# Patient Record
Sex: Male | Born: 1935 | Race: White | Hispanic: No | Marital: Married | Smoking: Former smoker
Health system: Southern US, Community
[De-identification: ages and names within clinical notes are randomized; demographics above are authoritative.]

## PROBLEM LIST (undated history)

## (undated) DIAGNOSIS — I447 Left bundle-branch block, unspecified: Secondary | ICD-10-CM

## (undated) DIAGNOSIS — I83893 Varicose veins of bilateral lower extremities with other complications: Secondary | ICD-10-CM

## (undated) DIAGNOSIS — I10 Essential (primary) hypertension: Secondary | ICD-10-CM

## (undated) DIAGNOSIS — N200 Calculus of kidney: Secondary | ICD-10-CM

## (undated) DIAGNOSIS — I119 Hypertensive heart disease without heart failure: Secondary | ICD-10-CM

## (undated) DIAGNOSIS — M7989 Other specified soft tissue disorders: Secondary | ICD-10-CM

## (undated) DIAGNOSIS — I429 Cardiomyopathy, unspecified: Secondary | ICD-10-CM

## (undated) DIAGNOSIS — C801 Malignant (primary) neoplasm, unspecified: Secondary | ICD-10-CM

## (undated) DIAGNOSIS — E785 Hyperlipidemia, unspecified: Secondary | ICD-10-CM

## (undated) DIAGNOSIS — I714 Abdominal aortic aneurysm, without rupture, unspecified: Secondary | ICD-10-CM

## (undated) DIAGNOSIS — K5730 Diverticulosis of large intestine without perforation or abscess without bleeding: Secondary | ICD-10-CM

## (undated) DIAGNOSIS — J45909 Unspecified asthma, uncomplicated: Secondary | ICD-10-CM

## (undated) DIAGNOSIS — R0902 Hypoxemia: Secondary | ICD-10-CM

## (undated) DIAGNOSIS — R197 Diarrhea, unspecified: Secondary | ICD-10-CM

## (undated) DIAGNOSIS — I8390 Asymptomatic varicose veins of unspecified lower extremity: Secondary | ICD-10-CM

## (undated) DIAGNOSIS — J439 Emphysema, unspecified: Secondary | ICD-10-CM

## (undated) DIAGNOSIS — J310 Chronic rhinitis: Secondary | ICD-10-CM

## (undated) DIAGNOSIS — K921 Melena: Secondary | ICD-10-CM

## (undated) DIAGNOSIS — J40 Bronchitis, not specified as acute or chronic: Secondary | ICD-10-CM

## (undated) DIAGNOSIS — K5792 Diverticulitis of intestine, part unspecified, without perforation or abscess without bleeding: Secondary | ICD-10-CM

## (undated) DIAGNOSIS — D126 Benign neoplasm of colon, unspecified: Secondary | ICD-10-CM

## (undated) DIAGNOSIS — R0989 Other specified symptoms and signs involving the circulatory and respiratory systems: Secondary | ICD-10-CM

## (undated) DIAGNOSIS — I872 Venous insufficiency (chronic) (peripheral): Secondary | ICD-10-CM

## (undated) DIAGNOSIS — C61 Malignant neoplasm of prostate: Secondary | ICD-10-CM

## (undated) DIAGNOSIS — R55 Syncope and collapse: Secondary | ICD-10-CM

## (undated) DIAGNOSIS — D649 Anemia, unspecified: Secondary | ICD-10-CM

## (undated) DIAGNOSIS — I6529 Occlusion and stenosis of unspecified carotid artery: Secondary | ICD-10-CM

## (undated) DIAGNOSIS — I428 Other cardiomyopathies: Secondary | ICD-10-CM

## (undated) DIAGNOSIS — E119 Type 2 diabetes mellitus without complications: Secondary | ICD-10-CM

## (undated) HISTORY — PX: HERNIA REPAIR: SHX51

## (undated) HISTORY — DX: Type 2 diabetes mellitus without complications: E11.9

## (undated) HISTORY — DX: Essential (primary) hypertension: I10

## (undated) HISTORY — DX: Bronchitis, not specified as acute or chronic: J40

## (undated) HISTORY — PX: CATARACT EXTRACTION: SUR2

## (undated) HISTORY — DX: Varicose veins of bilateral lower extremities with other complications: I83.893

## (undated) HISTORY — DX: Hyperlipidemia, unspecified: E78.5

## (undated) HISTORY — DX: Other specified soft tissue disorders: M79.89

## (undated) HISTORY — PX: PROSTATE SURGERY: SHX751

## (undated) HISTORY — DX: Asymptomatic varicose veins of unspecified lower extremity: I83.90

## (undated) HISTORY — DX: Venous insufficiency (chronic) (peripheral): I87.2

## (undated) HISTORY — DX: Syncope and collapse: R55

## (undated) HISTORY — DX: Benign neoplasm of colon, unspecified: D12.6

## (undated) HISTORY — DX: Anemia, unspecified: D64.9

## (undated) HISTORY — DX: Abdominal aortic aneurysm, without rupture, unspecified: I71.40

## (undated) HISTORY — DX: Other specified symptoms and signs involving the circulatory and respiratory systems: R09.89

## (undated) HISTORY — DX: Emphysema, unspecified: J43.9

## (undated) HISTORY — DX: Occlusion and stenosis of unspecified carotid artery: I65.29

## (undated) HISTORY — DX: Calculus of kidney: N20.0

## (undated) HISTORY — DX: Diarrhea, unspecified: R19.7

## (undated) HISTORY — DX: Malignant (primary) neoplasm, unspecified: C80.1

## (undated) HISTORY — DX: Malignant neoplasm of prostate: C61

## (undated) HISTORY — PX: OTHER SURGICAL HISTORY: SHX169

## (undated) HISTORY — DX: Left bundle-branch block, unspecified: I44.7

## (undated) HISTORY — DX: Unspecified asthma, uncomplicated: J45.909

## (undated) HISTORY — DX: Hypoxemia: R09.02

## (undated) HISTORY — DX: Abdominal aortic aneurysm, without rupture: I71.4

## (undated) HISTORY — DX: Other cardiomyopathies: I42.8

## (undated) HISTORY — DX: Diverticulosis of large intestine without perforation or abscess without bleeding: K57.30

## (undated) HISTORY — DX: Hypertensive heart disease without heart failure: I11.9

## (undated) HISTORY — DX: Melena: K92.1

## (undated) HISTORY — DX: Diverticulitis of intestine, part unspecified, without perforation or abscess without bleeding: K57.92

## (undated) HISTORY — DX: Chronic rhinitis: J31.0

## (undated) HISTORY — DX: Cardiomyopathy, unspecified: I42.9

---

## 2008-08-31 HISTORY — PX: LITHOTRIPSY: SUR834

## 2009-08-31 HISTORY — PX: OTHER SURGICAL HISTORY: SHX169

## 2009-09-04 ENCOUNTER — Ambulatory Visit (HOSPITAL_BASED_OUTPATIENT_CLINIC_OR_DEPARTMENT_OTHER): Admission: RE | Admit: 2009-09-04 | Discharge: 2009-09-04 | Payer: Self-pay | Admitting: Radiation Oncology

## 2009-09-26 ENCOUNTER — Ambulatory Visit: Admission: RE | Admit: 2009-09-26 | Discharge: 2009-10-18 | Payer: Self-pay | Admitting: Radiation Oncology

## 2009-12-29 HISTORY — PX: OTHER SURGICAL HISTORY: SHX169

## 2013-10-20 ENCOUNTER — Encounter: Payer: Self-pay | Admitting: Vascular Surgery

## 2013-10-20 ENCOUNTER — Other Ambulatory Visit: Payer: Self-pay | Admitting: *Deleted

## 2013-10-20 DIAGNOSIS — R0989 Other specified symptoms and signs involving the circulatory and respiratory systems: Secondary | ICD-10-CM

## 2013-11-21 ENCOUNTER — Encounter: Payer: Self-pay | Admitting: Vascular Surgery

## 2013-11-21 ENCOUNTER — Encounter (HOSPITAL_COMMUNITY): Payer: Self-pay

## 2013-11-27 ENCOUNTER — Encounter: Payer: Self-pay | Admitting: Vascular Surgery

## 2013-11-28 ENCOUNTER — Encounter: Payer: Self-pay | Admitting: Vascular Surgery

## 2013-11-28 ENCOUNTER — Encounter (INDEPENDENT_AMBULATORY_CARE_PROVIDER_SITE_OTHER): Payer: Self-pay

## 2013-11-28 ENCOUNTER — Ambulatory Visit (HOSPITAL_COMMUNITY)
Admission: RE | Admit: 2013-11-28 | Discharge: 2013-11-28 | Disposition: A | Discharge status: Home / Self Care | Payer: Medicare Other | Source: Ambulatory Visit | Attending: Vascular Surgery | Admitting: Vascular Surgery

## 2013-11-28 ENCOUNTER — Ambulatory Visit (INDEPENDENT_AMBULATORY_CARE_PROVIDER_SITE_OTHER): Payer: Medicare Other | Admitting: Vascular Surgery

## 2013-11-28 VITALS — BP 148/75 | HR 69 | Resp 18 | Ht 69.5 in | Wt 193.9 lb

## 2013-11-28 DIAGNOSIS — R0989 Other specified symptoms and signs involving the circulatory and respiratory systems: Secondary | ICD-10-CM

## 2013-11-28 DIAGNOSIS — I83893 Varicose veins of bilateral lower extremities with other complications: Secondary | ICD-10-CM

## 2013-11-28 DIAGNOSIS — M7989 Other specified soft tissue disorders: Secondary | ICD-10-CM | POA: Insufficient documentation

## 2013-11-28 HISTORY — DX: Other specified soft tissue disorders: M79.89

## 2013-11-28 HISTORY — DX: Other specified symptoms and signs involving the circulatory and respiratory systems: R09.89

## 2013-11-28 HISTORY — DX: Varicose veins of bilateral lower extremities with other complications: I83.893

## 2013-11-28 NOTE — Progress Notes (Signed)
Patient name: Harry Norris MRN: [id number] DOB: 05/31/1936 Sex: male   Referred by: Delena Bali  Reason for referral:  Chief Complaint  Patient presents with  . New Evaluation      referred by Dr. Delena Bali, leg swelling R>L  has been wearing compression hose for > 1 year    . Varicose Veins  . Venous Insufficiency    HISTORY OF PRESENT ILLNESS: The patient presents today for evaluation of a right greater than left leg swelling. He reports this is been present for many years. He has worn graduated compression garments for over a year and initially had some mild improvement but now is having worsening swelling again despite use of compression garment and elevation. He reports an achy sensation with prolonged standing. He is quite active at his age of 89 and enjoys golf. This is limiting his ability. He does not have any history of DVT. I do have the results of his noninvasive studies from [facility].  Past Medical History  Diagnosis Date  . Other primary cardiomyopathies   . Hypertension   . Cancer     prostate  . Unspecified venous (peripheral) insufficiency   . Anemia   . Diabetes mellitus without complication   . Diverticulosis of colon (without mention of hemorrhage)   . AAA (abdominal aortic aneurysm)   . Carotid artery occlusion   . Hyperlipidemia   . Benign neoplasm of colon   . Kidney stone     Past Surgical History  Procedure Laterality Date  . Hernia repair Bilateral   . Cataract extraction Bilateral   . Gold seed implants  2011    treatment of prostate cancer  . Lithotripsy  2010  .  cardiac catheterization  may 2011    History   Social History  . Marital Status: Married    Spouse Name: N/A    Number of Children: N/A  . Years of Education: N/A   Occupational History  . Not on file.   Social History Main Topics  . Smoking status: Former Smoker    Quit date: 09/01/1999  . Smokeless tobacco: Not on file  . Alcohol Use: No  . Drug Use: No  .  Sexual Activity: Not on file   Other Topics Concern  . Not on file   Social History Narrative  . No narrative on file    History reviewed. No pertinent family history.  Allergies as of 11/28/2013 - Review Complete 11/28/2013  Allergen Reaction Noted  . Ace inhibitors Cough 10/20/2013    Current Outpatient Prescriptions on File Prior to Visit  Medication Sig Dispense Refill  . albuterol (PROVENTIL) (2.5 MG/3ML) 0.083% nebulizer solution Take 2.5 mg by nebulization every 6 (six) hours as needed for wheezing or shortness of breath.      Marland Kitchen aspirin 325 MG EC tablet Take 325 mg by mouth daily.      Marland Kitchen atorvastatin (LIPITOR) 40 MG tablet Take 40 mg by mouth daily.      . carvedilol (COREG) 25 MG tablet Take 25 mg by mouth 2 (two) times daily with a meal. Take 1/2 tablet two times daily      . cyclobenzaprine (FLEXERIL) 10 MG tablet Take 10 mg by mouth 3 (three) times daily as needed for muscle spasms.      . fenofibrate 160 MG tablet Take 160 mg by mouth daily.      . furosemide (LASIX) 40 MG tablet Take 40 mg by mouth 2 (two) times daily.      Marland Kitchen  ipratropium-albuterol (DUONEB) 0.5-2.5 (3) MG/3ML SOLN Take 3 mLs by nebulization every 6 (six) hours as needed.      . meclizine (ANTIVERT) 25 MG tablet Take 25 mg by mouth as needed.       . Methylcellulose, Laxative, (CITRUCEL) 500 MG TABS Take 500 mg by mouth 2 (two) times daily.      . mometasone-formoterol (DULERA) 200-5 MCG/ACT AERO Inhale 2 puffs into the lungs 2 (two) times daily.      . Multiple Vitamin (MULTIVITAMIN) capsule Take 1 capsule by mouth daily.      . Omega-3 Fatty Acids (FISH OIL) 1000 MG CAPS Take 1,000 mg by mouth 2 (two) times daily.      . promethazine (PHENERGAN) 25 MG tablet Take 25 mg by mouth every 6 (six) hours as needed for nausea or vomiting.      . vitamin C (ASCORBIC ACID) 500 MG tablet Take 500 mg by mouth daily.      . vitamin E 400 UNIT capsule Take 400 Units by mouth daily.      . valsartan (DIOVAN) 40 MG  tablet Take 40 mg by mouth 2 (two) times daily.       No current facility-administered medications on file prior to visit.     REVIEW OF SYSTEMS:  Positives indicated with an "X"  CARDIOVASCULAR:  [ ]  chest pain   [ ]  chest pressure   [ ]  palpitations   [x ] orthopnea   [x ] dyspnea on exertion   [ ]  claudication   [ ]  rest pain   [ ]  DVT   [ ]  phlebitis PULMONARY:   [x ] productive cough   [x ] asthma   [x ] wheezing NEUROLOGIC:   [ ]  weakness  [ ]  paresthesias  [ ]  aphasia  [ ]  amaurosis  [ ]  dizziness HEMATOLOGIC:   [ ]  bleeding problems   [ ]  clotting disorders MUSCULOSKELETAL:  [ ]  joint pain   [ ]  joint swelling GASTROINTESTINAL: [ ]   blood in stool  [ ]   hematemesis GENITOURINARY:  [ ]   dysuria  [ ]   hematuria PSYCHIATRIC:  [ ]  history of major depression INTEGUMENTARY:  [ ]  rashes  [ ]  ulcers CONSTITUTIONAL:  [ ]  fever   [ ]  chills  PHYSICAL EXAMINATION:  General: The patient is a well-nourished male, in no acute distress. Vital signs are BP 148/75  Pulse 69  Resp 18  Ht 5' 9.5" (1.765 m)  Wt 193 lb 14.4 oz (87.952 kg)  BMI 28.23 kg/m2 Pulmonary: There is a good air exchange bilaterally without wheezing or rales. Abdomen: Soft and non-tender with normal pitch bowel sounds. Musculoskeletal: There are no major deformities.  There is no significant extremity pain. Neurologic: No focal weakness or paresthesias are detected, Skin: There are no ulcer or rashes noted. Significant varicosities more so on the right than on the left marked pitting edema on the right leg Psychiatric: The patient has normal affect. Cardiovascular: There is a regular rate and rhythm without significant murmur appreciated. 2+ right dorsalis pedis pulse absent left dorsalis pedis pulse  [facility] Vascular Lab Studies:  Ordered and Independently Reviewed lower surety arterial study showed ankle arm index of 0.93 on the right and 0.61 on the left  Impression and Plan:  Symptomatic right leg venous  reflux in the great saphenous vein with progressive swelling and discomfort associated with this. He is clearly failed conservative treatment. I have recommended laser ablation of his right great  saphenous vein for symptom relief. He understands and wishes to proceed as soon as possible. Understands this is an outpatient procedure under local anesthesia in our office.    [facility] Office: [phone]

## 2013-11-30 ENCOUNTER — Encounter: Payer: Self-pay | Admitting: Internal Medicine

## 2014-01-18 LAB — PULMONARY FUNCTION TEST

## 2014-02-16 ENCOUNTER — Institutional Professional Consult (permissible substitution): Payer: Self-pay | Admitting: Critical Care Medicine

## 2014-02-26 ENCOUNTER — Encounter: Payer: Self-pay | Admitting: Vascular Surgery

## 2014-02-27 ENCOUNTER — Telehealth: Payer: Self-pay | Admitting: Critical Care Medicine

## 2014-02-27 ENCOUNTER — Ambulatory Visit (INDEPENDENT_AMBULATORY_CARE_PROVIDER_SITE_OTHER): Payer: Medicare Other | Admitting: Vascular Surgery

## 2014-02-27 ENCOUNTER — Encounter: Payer: Self-pay | Admitting: Critical Care Medicine

## 2014-02-27 ENCOUNTER — Ambulatory Visit (INDEPENDENT_AMBULATORY_CARE_PROVIDER_SITE_OTHER): Payer: Medicare Other | Admitting: Critical Care Medicine

## 2014-02-27 ENCOUNTER — Encounter: Payer: Self-pay | Admitting: Vascular Surgery

## 2014-02-27 VITALS — BP 129/43 | HR 59 | Resp 18 | Ht 70.5 in | Wt 190.0 lb

## 2014-02-27 VITALS — BP 108/60 | HR 65 | Temp 97.6°F | Ht 71.0 in | Wt 192.0 lb

## 2014-02-27 DIAGNOSIS — C61 Malignant neoplasm of prostate: Secondary | ICD-10-CM | POA: Insufficient documentation

## 2014-02-27 DIAGNOSIS — J438 Other emphysema: Secondary | ICD-10-CM

## 2014-02-27 DIAGNOSIS — I6529 Occlusion and stenosis of unspecified carotid artery: Secondary | ICD-10-CM | POA: Insufficient documentation

## 2014-02-27 DIAGNOSIS — J432 Centrilobular emphysema: Secondary | ICD-10-CM

## 2014-02-27 DIAGNOSIS — I714 Abdominal aortic aneurysm, without rupture, unspecified: Secondary | ICD-10-CM | POA: Insufficient documentation

## 2014-02-27 DIAGNOSIS — E119 Type 2 diabetes mellitus without complications: Secondary | ICD-10-CM | POA: Insufficient documentation

## 2014-02-27 DIAGNOSIS — J439 Emphysema, unspecified: Secondary | ICD-10-CM

## 2014-02-27 DIAGNOSIS — I119 Hypertensive heart disease without heart failure: Secondary | ICD-10-CM | POA: Insufficient documentation

## 2014-02-27 DIAGNOSIS — I83893 Varicose veins of bilateral lower extremities with other complications: Secondary | ICD-10-CM

## 2014-02-27 HISTORY — DX: Emphysema, unspecified: J43.9

## 2014-02-27 MED ORDER — FORMOTEROL FUMARATE 20 MCG/2ML IN NEBU: 20.0000 ug | INHALATION_SOLUTION | Freq: Two times a day (BID) | RESPIRATORY_TRACT | Status: DC

## 2014-02-27 MED ORDER — IPRATROPIUM-ALBUTEROL 0.5-2.5 (3) MG/3ML IN SOLN: RESPIRATORY_TRACT | Status: DC

## 2014-02-27 MED ORDER — PREDNISONE 10 MG PO TABS: ORAL_TABLET | ORAL | Status: DC

## 2014-02-27 MED ORDER — AZITHROMYCIN 250 MG PO TABS: ORAL_TABLET | ORAL | Status: DC

## 2014-02-27 MED ORDER — BUDESONIDE 180 MCG/ACT IN AEPB: 1.0000 | INHALATION_SPRAY | Freq: Two times a day (BID) | RESPIRATORY_TRACT | Status: DC

## 2014-02-27 MED ORDER — BUDESONIDE 180 MCG/ACT IN AEPB: 2.0000 | INHALATION_SPRAY | Freq: Two times a day (BID) | RESPIRATORY_TRACT | Status: DC

## 2014-02-27 NOTE — Progress Notes (Signed)
Subjective:    Patient ID: Harry Norris, male    DOB: 12-Feb-1936, 78 y.o.   MRN: [id number]  HPI Comments: Dx Copd x 31yrs.  At first dx asthma per chodri.  Pt smoked for 35 2.5 PPD.  Off smoking x 15 yrs.   On no oxygen. Not been through pulm rehab  Shortness of Breath This is a chronic problem. The current episode started more than 1 year ago. The problem occurs daily (exertional only, can walk 9holes ok, hard up incline or stairs, cannot lay down to sleep, sleeps in recliner). The problem has been gradually worsening. Associated symptoms include leg swelling, orthopnea, PND, sputum production and wheezing. Pertinent negatives include no abdominal pain, chest pain, claudication, coryza, ear pain, fever, headaches, hemoptysis, leg pain, neck pain, rash, rhinorrhea, sore throat, swollen glands, syncope or vomiting. The symptoms are aggravated by any activity, lying flat, exercise, weather changes and URIs. Associated symptoms comments: Mucus is green. Risk factors include smoking. He has tried beta agonist inhalers and steroid inhalers (albuterol in nebulizer) for the symptoms. The treatment provided significant relief. His past medical history is significant for bronchiolitis, CAD, COPD and pneumonia. There is no history of chronic lung disease, DVT, a heart failure, PE or a recent surgery.    Past Medical History  Diagnosis Date  . Other primary cardiomyopathies   . Hypertension   . Cancer     prostate  . Unspecified venous (peripheral) insufficiency   . Anemia   . DM type 2 (diabetes mellitus, type 2)   . Diverticulosis of colon (without mention of hemorrhage)   . AAA (abdominal aortic aneurysm)   . Carotid artery occlusion   . Hyperlipidemia   . Benign neoplasm of colon   . Kidney stone   . Bronchitis   . Varicose veins      Family History  Problem Relation Age of Onset  . Cancer - Colon Mother     deceased  . Emphysema Sister     deceased  . Kidney failure Father   . Heart  failure Father   . Hypertension Mother   . Diabetes Mother      History   Social History  . Marital Status: Married    Spouse Name: N/A    Number of Children: N/A  . Years of Education: N/A   Occupational History  . Not on file.   Social History Main Topics  . Smoking status: Former Smoker -- 2.50 packs/day for 40 years    Types: Cigarettes    Quit date: 09/01/1999  . Smokeless tobacco: Never Used  . Alcohol Use: No  . Drug Use: No  . Sexual Activity: Not on file   Other Topics Concern  . Not on file   Social History Narrative   Lives with wife.   Retired.   Telephone business prior.     Allergies  Allergen Reactions  . Ace Inhibitors Cough     Outpatient Prescriptions Prior to Visit  Medication Sig Dispense Refill  . albuterol (PROVENTIL) (2.5 MG/3ML) 0.083% nebulizer solution Take 2.5 mg by nebulization every 6 (six) hours as needed for wheezing or shortness of breath.      Marland Kitchen aspirin 325 MG EC tablet Take 325 mg by mouth daily.      Marland Kitchen atorvastatin (LIPITOR) 40 MG tablet Take 40 mg by mouth daily.      . carvedilol (COREG) 25 MG tablet Take 12.5 mg by mouth 2 (two) times daily with a meal.       .  cyclobenzaprine (FLEXERIL) 10 MG tablet Take 10 mg by mouth 3 (three) times daily as needed for muscle spasms.      . fenofibrate 160 MG tablet Take 160 mg by mouth daily.      . furosemide (LASIX) 40 MG tablet Take 40 mg by mouth daily.       Marland Kitchen ipratropium-albuterol (DUONEB) 0.5-2.5 (3) MG/3ML SOLN Take 3 mLs by nebulization daily.       Marland Kitchen losartan (COZAAR) 100 MG tablet Take 100 mg by mouth daily.      . meclizine (ANTIVERT) 25 MG tablet Take 25 mg by mouth as needed.       . Methylcellulose, Laxative, (CITRUCEL) 500 MG TABS Take 500 mg by mouth 2 (two) times daily.      . mometasone-formoterol (DULERA) 200-5 MCG/ACT AERO Inhale 2 puffs into the lungs 2 (two) times daily.      . Multiple Vitamin (MULTIVITAMIN) capsule Take 1 capsule by mouth daily.      . Omega-3  Fatty Acids (FISH OIL) 1000 MG CAPS Take 1,000 mg by mouth 2 (two) times daily.      . promethazine (PHENERGAN) 25 MG tablet Take 25 mg by mouth every 6 (six) hours as needed for nausea or vomiting.      . vitamin C (ASCORBIC ACID) 500 MG tablet Take 500 mg by mouth daily.      . vitamin E 400 UNIT capsule Take 400 Units by mouth daily.      . valsartan (DIOVAN) 40 MG tablet Take 40 mg by mouth 2 (two) times daily.       No facility-administered medications prior to visit.      Review of Systems  Constitutional: Negative for fever.  HENT: Negative for ear pain, rhinorrhea, sinus pressure, sneezing, sore throat, trouble swallowing and voice change.   Respiratory: Positive for sputum production, shortness of breath and wheezing. Negative for hemoptysis.   Cardiovascular: Positive for orthopnea, leg swelling and PND. Negative for chest pain, claudication and syncope.  Gastrointestinal: Negative.  Negative for vomiting and abdominal pain.  Musculoskeletal: Negative for neck pain.  Skin: Negative for rash.  Neurological: Negative for headaches.       Objective:   Physical Exam Filed Vitals:   02/27/14 1443  BP: 108/60  Pulse: 65  Temp: 97.6 F (36.4 C)  TempSrc: Oral  Height: 5\' 11"  (1.803 m)  Weight: 192 lb (87.091 kg)  SpO2: 95%    Gen: Pleasant, well-nourished, in no distress,  normal affect  ENT: No lesions,  mouth clear,  oropharynx clear, no postnasal drip  Neck: No JVD, no TMG, no carotid bruits  Lungs: No use of accessory muscles, no dullness to percussion, distant BS  Cardiovascular: RRR, heart sounds normal, no murmur or gallops, no peripheral edema  Abdomen: soft and NT, no HSM,  BS normal  Musculoskeletal: No deformities, no cyanosis or clubbing  Neuro: alert, non focal  Skin: Warm, no lesions or rashes  No results found.  PFTs 01/18/14:  FeV1 1.58 45% FVC 2.0 50% FeV1/FVC 79% Fef 25-75 795 ( 40% improved after BD) CXR: Copd changes No desat with  ambulation       Assessment & Plan:   COPD with emphysema Gold C  Start pulmicort two puff twice daily Stop albuterol/ipratroprium in nebulzer once this runs out Use perforomist twice daily in nebulizer Stop dulera and symbicort Azithromycin 250mg  Take two once then one daily until gone Prednisone 10mg  Take 4 tablets daily for 5  days then stop An overnight oxygen test will be obtained Referral to pulmonary rehab will be made Return 6 weeks    Updated Medication List Outpatient Encounter Prescriptions as of 02/27/2014  Medication Sig  . albuterol (PROVENTIL) (2.5 MG/3ML) 0.083% nebulizer solution Take 2.5 mg by nebulization every 6 (six) hours as needed for wheezing or shortness of breath.  Marland Kitchen aspirin 325 MG EC tablet Take 325 mg by mouth daily.  Marland Kitchen atorvastatin (LIPITOR) 40 MG tablet Take 40 mg by mouth daily.  . carvedilol (COREG) 25 MG tablet Take 12.5 mg by mouth 2 (two) times daily with a meal.   . cyclobenzaprine (FLEXERIL) 10 MG tablet Take 10 mg by mouth 3 (three) times daily as needed for muscle spasms.  . fenofibrate 160 MG tablet Take 160 mg by mouth daily.  . formoterol (PERFOROMIST) 20 MCG/2ML nebulizer solution Take 2 mLs (20 mcg total) by nebulization 2 (two) times daily.  . furosemide (LASIX) 40 MG tablet Take 40 mg by mouth daily.   Marland Kitchen ipratropium-albuterol (DUONEB) 0.5-2.5 (3) MG/3ML SOLN Take daily until current supply runs out then STOP  . losartan (COZAAR) 100 MG tablet Take 100 mg by mouth daily.  . meclizine (ANTIVERT) 25 MG tablet Take 25 mg by mouth as needed.   . Methylcellulose, Laxative, (CITRUCEL) 500 MG TABS Take 500 mg by mouth 2 (two) times daily.  . Multiple Vitamin (MULTIVITAMIN) capsule Take 1 capsule by mouth daily.  . Omega-3 Fatty Acids (FISH OIL) 1000 MG CAPS Take 1,000 mg by mouth 2 (two) times daily.  . promethazine (PHENERGAN) 25 MG tablet Take 25 mg by mouth every 6 (six) hours as needed for nausea or vomiting.  . vitamin C (ASCORBIC ACID)  500 MG tablet Take 500 mg by mouth daily.  . vitamin E 400 UNIT capsule Take 400 Units by mouth daily.  . [DISCONTINUED] formoterol (PERFOROMIST) 20 MCG/2ML nebulizer solution Take 20 mcg by nebulization 2 (two) times daily.  . [DISCONTINUED] ipratropium-albuterol (DUONEB) 0.5-2.5 (3) MG/3ML SOLN Take 3 mLs by nebulization daily.   . [DISCONTINUED] mometasone-formoterol (DULERA) 200-5 MCG/ACT AERO Inhale 2 puffs into the lungs 2 (two) times daily.  Marland Kitchen azithromycin (ZITHROMAX) 250 MG tablet Take two once then one daily until gone  . budesonide (PULMICORT FLEXHALER) 180 MCG/ACT inhaler Inhale 1 puff into the lungs 2 (two) times daily.  . budesonide (PULMICORT FLEXHALER) 180 MCG/ACT inhaler Inhale 2 puffs into the lungs 2 (two) times daily.  . predniSONE (DELTASONE) 10 MG tablet Take 4 tablets daily for 5 days then stop  . [DISCONTINUED] valsartan (DIOVAN) 40 MG tablet Take 40 mg by mouth 2 (two) times daily.

## 2014-02-27 NOTE — Progress Notes (Signed)
Problems with Activities of Daily Living Secondary to Leg Pain  1. Harry Norris states yard work is difficult for him due to leg pain and swelling.    2. Harry Norris states that all activities that require prolonged standing are difficult for him due to leg pain and swelling.      Failure of  Conservative Therapy:  1. Worn 20-30 mm Hg thigh high compression hose >3 months with no relief of symptoms.  2. Frequently elevates legs-no relief of symptoms  3. Taken Ibuprofen 600 Mg TID with no relief of symptoms.  Patient has today for continued discussion of his right leg venous hypertension. He has been compliant with her graduated compression garments and continues to have difficulty despite this. He does elevate his legs as well. He does have significantly more swelling in his right leg in his left leg today. There really is no swelling in the left leg.  I did reimage his right leg with SonoSite and this does show reflux in his great saphenous vein throughout the thigh. This does show extension of the marked varicosities in his medial calf arising from the saphenous vein below his knee  Failed conservative treatment right leg venous hypertension. I have recommended laser ablation of his right great saphenous vein. I discussed the procedure as an outpatient procedure under local anesthesia in our office taking approximately one hour. The slight risk of DVT associated with the procedure. He wishes to proceed as soon as possible

## 2014-02-27 NOTE — Assessment & Plan Note (Signed)
Start pulmicort two puff twice daily Stop albuterol/ipratroprium in nebulzer once this runs out Use perforomist twice daily in nebulizer Stop dulera and symbicort Azithromycin 250mg  Take two once then one daily until gone Prednisone 10mg  Take 4 tablets daily for 5 days then stop An overnight oxygen test will be obtained Referral to pulmonary rehab will be made Return 6 weeks

## 2014-02-27 NOTE — Telephone Encounter (Signed)
Spoke with wife - aware that there are 3 alternatives that are possibly covered. Aware that we will contact pharmacy to check on these. Aware to finish taking Pulmicort samples in the meantime.  Will contact pharmacy in the AM

## 2014-02-27 NOTE — Patient Instructions (Addendum)
Start pulmicort two puff twice daily Stop albuterol/ipratroprium in nebulzer once this runs out Use perforomist twice daily in nebulizer Stop dulera and symbicort Azithromycin 250mg  Take two once then one daily until gone Prednisone 10mg  Take 4 tablets daily for 5 days then stop An overnight oxygen test will be obtained Referral to pulmonary rehab will be made Return 6 weeks

## 2014-02-27 NOTE — Telephone Encounter (Signed)
Look into either qvar 80 two puff twice daily or asmanex two puff daily, or flovent HFA 110 two puff twice daily I gave pt three pulmicort samples, have him use those first

## 2014-02-27 NOTE — Telephone Encounter (Signed)
Called and spoke with [facility] and they stated that the [place] is not covered by the pts insurance and is $250.  PW please advise of another alternative for the pt.  Thanks  Allergies  Allergen Reactions  . Ace Inhibitors Cough    Current Outpatient Prescriptions on File Prior to Visit  Medication Sig Dispense Refill  . albuterol (PROVENTIL) (2.5 MG/3ML) 0.083% nebulizer solution Take 2.5 mg by nebulization every 6 (six) hours as needed for wheezing or shortness of breath.      Marland Kitchen aspirin 325 MG EC tablet Take 325 mg by mouth daily.      Marland Kitchen atorvastatin (LIPITOR) 40 MG tablet Take 40 mg by mouth daily.      Marland Kitchen azithromycin (ZITHROMAX) 250 MG tablet Take two once then one daily until gone  6 tablet  0  . budesonide (PULMICORT FLEXHALER) 180 MCG/ACT inhaler Inhale 1 puff into the lungs 2 (two) times daily.  1 each  6  . budesonide (PULMICORT FLEXHALER) 180 MCG/ACT inhaler Inhale 2 puffs into the lungs 2 (two) times daily.  3 Inhaler  0  . carvedilol (COREG) 25 MG tablet Take 12.5 mg by mouth 2 (two) times daily with a meal.       . cyclobenzaprine (FLEXERIL) 10 MG tablet Take 10 mg by mouth 3 (three) times daily as needed for muscle spasms.      . fenofibrate 160 MG tablet Take 160 mg by mouth daily.      . formoterol (PERFOROMIST) 20 MCG/2ML nebulizer solution Take 2 mLs (20 mcg total) by nebulization 2 (two) times daily.  120 mL  6  . furosemide (LASIX) 40 MG tablet Take 40 mg by mouth daily.       Marland Kitchen ipratropium-albuterol (DUONEB) 0.5-2.5 (3) MG/3ML SOLN Take daily until current supply runs out then STOP  360 mL    . losartan (COZAAR) 100 MG tablet Take 100 mg by mouth daily.      . meclizine (ANTIVERT) 25 MG tablet Take 25 mg by mouth as needed.       . Methylcellulose, Laxative, (CITRUCEL) 500 MG TABS Take 500 mg by mouth 2 (two) times daily.      . Multiple Vitamin (MULTIVITAMIN) capsule Take 1 capsule by mouth daily.      . Omega-3 Fatty Acids (FISH OIL) 1000 MG CAPS Take  1,000 mg by mouth 2 (two) times daily.      . predniSONE (DELTASONE) 10 MG tablet Take 4 tablets daily for 5 days then stop  20 tablet  0  . promethazine (PHENERGAN) 25 MG tablet Take 25 mg by mouth every 6 (six) hours as needed for nausea or vomiting.      . vitamin C (ASCORBIC ACID) 500 MG tablet Take 500 mg by mouth daily.      . vitamin E 400 UNIT capsule Take 400 Units by mouth daily.       No current facility-administered medications on file prior to visit.

## 2014-02-28 ENCOUNTER — Other Ambulatory Visit: Payer: Self-pay | Admitting: *Deleted

## 2014-02-28 DIAGNOSIS — I83893 Varicose veins of bilateral lower extremities with other complications: Secondary | ICD-10-CM

## 2014-03-01 MED ORDER — BECLOMETHASONE DIPROPIONATE 80 MCG/ACT IN AERS: 2.0000 | INHALATION_SPRAY | Freq: Two times a day (BID) | RESPIRATORY_TRACT | Status: DC

## 2014-03-01 NOTE — Telephone Encounter (Signed)
Spoke with patient Aware of covered med Qvar  [facility] Nothing further needed.

## 2014-03-01 NOTE — Telephone Encounter (Signed)
Spoke with pharmacist-- checking to see if Qvar, Asmanex or Flovent are covered by insurance. All have a $30 copay  Please advise Dr Joya Gaskins which one of the three you would prefer patient to use. Thanks!

## 2014-03-01 NOTE — Telephone Encounter (Signed)
Go with qvar 80 two puff bid

## 2014-03-11 ENCOUNTER — Telehealth: Payer: Self-pay | Admitting: Critical Care Medicine

## 2014-03-11 DIAGNOSIS — J432 Centrilobular emphysema: Secondary | ICD-10-CM

## 2014-03-11 NOTE — Telephone Encounter (Signed)
ONO positive for mulitple desats Needs oxygen 2L qhs

## 2014-03-13 MED ORDER — FORMOTEROL FUMARATE 20 MCG/2ML IN NEBU: 20.0000 ug | INHALATION_SOLUTION | Freq: Two times a day (BID) | RESPIRATORY_TRACT | Status: DC

## 2014-03-13 NOTE — Telephone Encounter (Signed)
Pt returned Crystal's call & can be reached at [phone] or [phone].  Satira Anis

## 2014-03-13 NOTE — Telephone Encounter (Signed)
Order for 02 faxed to [facility].  Spoke with Ivin Booty @[facility]  in [place] and she confirms receipt of 02 order today Dawne J Lanny Cramp

## 2014-03-13 NOTE — Telephone Encounter (Signed)
Called [facility], spoke with Caryl Pina in the pharmacy.  She did receive the order an rx.  She has already called pt and lmtcb.  Once she speaks with pt, she will be able to have Perforomist shipped.    I spoke with pt.  Reports he has already spoken with [facility] regarding Perforomist and was advised they would have this shipped.  Pt states he currently has approx 40 doses of Perforomist.  He is aware he should be receiving another call from [facility] to have o2 set up and is to call back if he doesn't receive this call in the next few days.  PCCs, pls ensure o2 order is sent to [facility] as order was placed while in [place] office.  Thank you.

## 2014-03-13 NOTE — Telephone Encounter (Signed)
Called, spoke with pt.  Informed him of below results and recs per Dr. Joya Gaskins.  He verbalized understanding and is aware [facility] will be contacting him to set up o2.  Order placed - PCCs, will you please ensure [facility] receives order as it was placed while in the [place] office.  Thank you.  Order was placed during last OV on June 30 with PW to have [facility] set pt up with Perforomist.  Pt states he hasn't heard anything regarding this and hasn't received medication yet either.  Spoke with Caryl Pina with [facility].  Was advised they have not received order for Perforomist rx from June 30 OV with PW.  She is requesting this to be faxed to her attn at [phone].  Caryl Pina reports once this is done, they can ship to pt.  Order and Rx have been faxed.  lmomtcb to inform pt that we have sent order and rx to [facility] and to please call office back if he doesn't receive call regarding meds or ONO by the end of the wk.

## 2014-03-15 ENCOUNTER — Telehealth: Payer: Self-pay | Admitting: Critical Care Medicine

## 2014-03-15 NOTE — Telephone Encounter (Signed)
Spoke with [facility] Needing to clarify instructions on Perforomist - advised 37mL BID as written Also asking if to continue taking Duoneb  Per last office note PW advised to stop Duoneb, start Perforomist Patient Instructions  Start pulmicort two puff twice daily  Stop albuterol/ipratroprium in nebulzer once this runs out  Use perforomist twice daily in nebulizer  Stop dulera and symbicort  Azithromycin 250mg  Take two once then one daily until gone  Prednisone 10mg  Take 4 tablets daily for 5 days then stop  An overnight oxygen test will be obtained  Referral to pulmonary rehab will be made  Return 6 weeks   Nothing further needed.

## 2014-03-23 ENCOUNTER — Institutional Professional Consult (permissible substitution): Payer: Self-pay | Admitting: Critical Care Medicine

## 2014-03-28 ENCOUNTER — Encounter: Payer: Self-pay | Admitting: Vascular Surgery

## 2014-03-29 ENCOUNTER — Encounter: Payer: Self-pay | Admitting: Vascular Surgery

## 2014-03-29 ENCOUNTER — Ambulatory Visit (INDEPENDENT_AMBULATORY_CARE_PROVIDER_SITE_OTHER): Payer: Medicare Other | Admitting: Vascular Surgery

## 2014-03-29 VITALS — BP 129/62 | HR 54 | Resp 16 | Ht 70.0 in | Wt 190.0 lb

## 2014-03-29 DIAGNOSIS — I83893 Varicose veins of bilateral lower extremities with other complications: Secondary | ICD-10-CM

## 2014-03-29 HISTORY — PX: ENDOVENOUS ABLATION SAPHENOUS VEIN W/ LASER: SUR449

## 2014-03-29 NOTE — Progress Notes (Signed)
   Laser Ablation Procedure      Date: 03/29/2014    Harry Norris DOB:August 21, 1936  Consent signed: Yes  Surgeon:T.F. Zaviyar Rahal  Procedure: Laser Ablation: right Greater Saphenous Vein  BP 129/62  Pulse 54  Resp 16  Ht 5\' 10"  (1.778 m)  Wt 190 lb (86.183 kg)  BMI 27.26 kg/m2  Start time: 10:45 AM   End time: 11:40AM  Tumescent Anesthesia: 450 cc 0.9% NaCl with 50 cc Lidocaine HCL with 1% Epi and 15 cc 8.4% NaHCO3  Local Anesthesia: 4 cc Lidocaine HCL and NaHCO3 (ratio 2:1)  Continuous Mode: 15 Watts Total Energy 2668 Joules Total Time2:58       Patient tolerated procedure well: Yes   Description of Procedure:  After marking the course of the saphenous vein and the secondary varicosities in the standing position, the patient was placed on the operating table in the supine position, and the right leg was prepped and draped in sterile fashion. Local anesthetic was administered, and under ultrasound guidance the saphenous vein was accessed with a micro needle and guide wire; then the micro puncture sheath was placed. A guide wire was inserted to the saphenofemoral junction, followed by a 5 french sheath.  The position of the sheath and then the laser fiber below the junction was confirmed using the ultrasound and visualization of the aiming beam.  Tumescent anesthesia was administered along the course of the saphenous vein using ultrasound guidance. Protective laser glasses were placed on the patient, and the laser was fired at at 15 watt continuous mode.  For a total of 2668 joules.  A steri strip was applied to the puncture site.    ABD pads and thigh high compression stockings were applied.  Ace wrap bandages were applied at the top of the saphenofemoral junction.  Blood loss was less than 15 cc.  The patient ambulated out of the operating room having tolerated the procedure well.

## 2014-03-30 ENCOUNTER — Encounter: Payer: Self-pay | Admitting: Critical Care Medicine

## 2014-04-03 ENCOUNTER — Telehealth: Payer: Self-pay | Admitting: *Deleted

## 2014-04-03 NOTE — Telephone Encounter (Signed)
    04/03/2014  Time: 9:32 AM   Patient Name: Harry Norris  Patient of: T.F. Early  Procedure:Laser Ablation right greater saphenous vein  03-29-2014    Reached patient at home and checked  His status  Yes    Comments/Actions Taken: No problems with right leg pain or swelling.  Reviewed post procedural instructions with Harry Norris and reminded him of post laser ablation duplex and VV FU with Dr. Donnetta Hutching on 04-05-2014.      @SIGNATURE @

## 2014-04-04 ENCOUNTER — Encounter: Payer: Self-pay | Admitting: Vascular Surgery

## 2014-04-05 ENCOUNTER — Encounter: Payer: Self-pay | Admitting: Vascular Surgery

## 2014-04-05 ENCOUNTER — Ambulatory Visit (INDEPENDENT_AMBULATORY_CARE_PROVIDER_SITE_OTHER): Payer: Medicare Other | Admitting: Vascular Surgery

## 2014-04-05 ENCOUNTER — Ambulatory Visit (HOSPITAL_COMMUNITY)
Admission: RE | Admit: 2014-04-05 | Discharge: 2014-04-05 | Disposition: A | Discharge status: Home / Self Care | Payer: Medicare Other | Source: Ambulatory Visit | Attending: Vascular Surgery | Admitting: Vascular Surgery

## 2014-04-05 VITALS — BP 149/67 | HR 67 | Resp 18 | Ht 70.0 in | Wt 190.0 lb

## 2014-04-05 DIAGNOSIS — I83893 Varicose veins of bilateral lower extremities with other complications: Secondary | ICD-10-CM

## 2014-04-05 NOTE — Progress Notes (Signed)
Presents today for followup of his laser ablation of right great saphenous vein one week ago in our office. He has very good result with no pain. He does report some very mild tenderness over the medial aspect of his right thigh.  Past Medical History  Diagnosis Date  . Other primary cardiomyopathies   . Hypertension   . Cancer     prostate  . Unspecified venous (peripheral) insufficiency   . Anemia   . DM type 2 (diabetes mellitus, type 2)   . Diverticulosis of colon (without mention of hemorrhage)   . AAA (abdominal aortic aneurysm)   . Carotid artery occlusion   . Hyperlipidemia   . Benign neoplasm of colon   . Kidney stone   . Bronchitis   . Varicose veins     History  Substance Use Topics  . Smoking status: Former Smoker -- 2.50 packs/day for 40 years    Types: Cigarettes    Quit date: 09/01/1999  . Smokeless tobacco: Never Used  . Alcohol Use: No    Family History  Problem Relation Age of Onset  . Cancer - Colon Mother     deceased  . Emphysema Sister     deceased  . Kidney failure Father   . Heart failure Father   . Hypertension Mother   . Diabetes Mother     Allergies  Allergen Reactions  . Ace Inhibitors Cough    Current outpatient prescriptions:albuterol (PROVENTIL) (2.5 MG/3ML) 0.083% nebulizer solution, Take 2.5 mg by nebulization daily. , Disp: , Rfl: ;  aspirin 325 MG EC tablet, Take 325 mg by mouth daily., Disp: , Rfl: ;  atorvastatin (LIPITOR) 40 MG tablet, Take 40 mg by mouth daily., Disp: , Rfl: ;  azithromycin (ZITHROMAX) 250 MG tablet, Take two once then one daily until gone, Disp: 6 tablet, Rfl: 0 beclomethasone (QVAR) 80 MCG/ACT inhaler, Inhale 2 puffs into the lungs 2 (two) times daily., Disp: 1 Inhaler, Rfl: 6;  budesonide (PULMICORT FLEXHALER) 180 MCG/ACT inhaler, Inhale 1 puff into the lungs 2 (two) times daily., Disp: 1 each, Rfl: 6;  budesonide (PULMICORT FLEXHALER) 180 MCG/ACT inhaler, Inhale 2 puffs into the lungs 2 (two) times daily.,  Disp: 3 Inhaler, Rfl: 0 carvedilol (COREG) 25 MG tablet, Take 12.5 mg by mouth 2 (two) times daily with a meal. , Disp: , Rfl: ;  cyclobenzaprine (FLEXERIL) 10 MG tablet, Take 10 mg by mouth 3 (three) times daily as needed for muscle spasms., Disp: , Rfl: ;  fenofibrate 160 MG tablet, Take 160 mg by mouth daily., Disp: , Rfl: ;  formoterol (PERFOROMIST) 20 MCG/2ML nebulizer solution, Take 2 mLs (20 mcg total) by nebulization 2 (two) times daily., Disp: 120 mL, Rfl: 6 furosemide (LASIX) 40 MG tablet, Take 40 mg by mouth daily. , Disp: , Rfl: ;  ipratropium-albuterol (DUONEB) 0.5-2.5 (3) MG/3ML SOLN, Take daily until current supply runs out then STOP, Disp: 360 mL, Rfl: ;  losartan (COZAAR) 100 MG tablet, Take 100 mg by mouth daily., Disp: , Rfl: ;  meclizine (ANTIVERT) 25 MG tablet, Take 25 mg by mouth as needed. , Disp: , Rfl:  Methylcellulose, Laxative, (CITRUCEL) 500 MG TABS, Take 500 mg by mouth 2 (two) times daily., Disp: , Rfl: ;  Multiple Vitamin (MULTIVITAMIN) capsule, Take 1 capsule by mouth daily., Disp: , Rfl: ;  Omega-3 Fatty Acids (FISH OIL) 1000 MG CAPS, Take 1,000 mg by mouth 2 (two) times daily., Disp: , Rfl: ;  predniSONE (DELTASONE) 10 MG tablet,  Take 4 tablets daily for 5 days then stop, Disp: 20 tablet, Rfl: 0 promethazine (PHENERGAN) 25 MG tablet, Take 25 mg by mouth every 6 (six) hours as needed for nausea or vomiting., Disp: , Rfl: ;  vitamin C (ASCORBIC ACID) 500 MG tablet, Take 500 mg by mouth daily., Disp: , Rfl: ;  vitamin E 400 UNIT capsule, Take 400 Units by mouth daily., Disp: , Rfl:   BP 149/67  Pulse 67  Resp 18  Ht 5\' 10"  (1.778 m)  Wt 190 lb (86.183 kg)  BMI 27.26 kg/m2  Body mass index is 27.26 kg/(m^2).       On physical exam, he has no erythema and no bridging over the medial aspect. The puncture site for entry into the saphenous vein is healing quite well. He does have chronic swelling in his right lower extremity.  Venous duplex today was reviewed with the  patient and his wife. This reveals successful ablation of his great saphenous vein from the insertion site below the knee to the saphenofemoral junction with no evidence of DVT  Impression and plan successful ablation of right great saphenous vein for treatment of superficial venous reflux. His thigh high compression for one additional week and then on an as-needed basis. I did encourage him to wear knee-high compression long-term when he is up walking. He does remain quite active. He has had stable claudication for 20 years and is managed is quite well. We will see him again on an as-needed basis

## 2014-04-10 ENCOUNTER — Ambulatory Visit (INDEPENDENT_AMBULATORY_CARE_PROVIDER_SITE_OTHER): Payer: Medicare Other | Admitting: Critical Care Medicine

## 2014-04-10 ENCOUNTER — Encounter: Payer: Self-pay | Admitting: Critical Care Medicine

## 2014-04-10 VITALS — BP 114/60 | HR 55 | Temp 97.6°F | Ht 70.0 in | Wt 193.2 lb

## 2014-04-10 DIAGNOSIS — Z23 Encounter for immunization: Secondary | ICD-10-CM

## 2014-04-10 DIAGNOSIS — R0902 Hypoxemia: Secondary | ICD-10-CM

## 2014-04-10 DIAGNOSIS — J432 Centrilobular emphysema: Secondary | ICD-10-CM

## 2014-04-10 DIAGNOSIS — J439 Emphysema, unspecified: Secondary | ICD-10-CM

## 2014-04-10 DIAGNOSIS — G4736 Sleep related hypoventilation in conditions classified elsewhere: Secondary | ICD-10-CM

## 2014-04-10 DIAGNOSIS — J449 Chronic obstructive pulmonary disease, unspecified: Secondary | ICD-10-CM

## 2014-04-10 DIAGNOSIS — E669 Obesity, unspecified: Secondary | ICD-10-CM

## 2014-04-10 HISTORY — DX: Hypoxemia: R09.02

## 2014-04-10 NOTE — Assessment & Plan Note (Signed)
Gold C Copd stable at present Nocturnal oxygen desaturation Plan When Qvar runs out, change to pulmicort one puff twice daily, use samples Stay on perforomist twice daily Stay on oxygen at night Prevnar 13 was given Return 4 months

## 2014-04-10 NOTE — Patient Instructions (Addendum)
When Qvar runs out, change to pulmicort one puff twice daily, use samples Stay on perforomist twice daily Stay on oxygen at night Prevnar 13 was given Return 4 months

## 2014-04-10 NOTE — Progress Notes (Signed)
Subjective:    Patient ID: Harry Norris, male    DOB: [birth year], 78 y.o.   MRN: [id number]  HPI Comments: Dx Copd x 31yrs.  At first dx asthma per chodri.  Pt smoked for 35 2.5 PPD.  Off smoking x 15 yrs.   On no oxygen. Not been through pulm rehab  04/10/2014 Chief Complaint  Patient presents with  . Follow-up    Feeling the same,sob-dame,cough-better,no wheeze, denies cp or tightness,wearing O2 2L at hs,not in Pulm. Rehab.  Pt still with dyspnea on exertion.  Issues with walking d/t leg issues.  Pt has oxygen now at night .  ?if probe off.    tart pulmicort two puff twice daily Stop albuterol/ipratroprium in nebulzer once this runs out Use perforomist twice daily in nebulizer Stop dulera and symbicort Azithromycin 250mg  Take two once then one daily until gone Prednisone 10mg  Take 4 tablets daily for 5 days then stop An overnight oxygen test will be obtained Referral to pulmonary rehab will be made      Review of Systems  HENT: Negative for sinus pressure, sneezing, trouble swallowing and voice change.   Gastrointestinal: Negative.        Objective:   Physical Exam  Filed Vitals:   04/10/14 1034  BP: 114/60  Pulse: 55  Temp: 97.6 F (36.4 C)  TempSrc: Oral  Height: 5\' 10"  (1.778 m)  Weight: 193 lb 3.2 oz (87.635 kg)  SpO2: 97%    Gen: Pleasant, well-nourished, in no distress,  normal affect  ENT: No lesions,  mouth clear,  oropharynx clear, no postnasal drip  Neck: No JVD, no TMG, no carotid bruits  Lungs: No use of accessory muscles, no dullness to percussion, distant BS  Cardiovascular: RRR, heart sounds normal, no murmur or gallops, no peripheral edema  Abdomen: soft and NT, no HSM,  BS normal  Musculoskeletal: No deformities, no cyanosis or clubbing  Neuro: alert, non focal  Skin: Warm, no lesions or rashes  No results found.  PFTs 01/18/14:  FeV1 1.58 45% FVC 2.0 50% FeV1/FVC 79% Fef 25-75 795 ( 40% improved after BD) CXR: Copd changes No  desat with ambulation       Assessment & Plan:   COPD with emphysema Gold C  Gold C Copd stable at present Nocturnal oxygen desaturation Plan When Qvar runs out, change to pulmicort one puff twice daily, use samples Stay on perforomist twice daily Stay on oxygen at night Prevnar 13 was given Return 4 months  Nocturnal hypoxemia due to emphysema Cont nocturnal oxygen therapy 2L     Updated Medication List Outpatient Encounter Prescriptions as of 04/10/2014  Medication Sig  . albuterol (PROVENTIL) (2.5 MG/3ML) 0.083% nebulizer solution Take 2.5 mg by nebulization 2 (two) times daily.   Marland Kitchen aspirin 325 MG EC tablet Take 325 mg by mouth daily.  Marland Kitchen atorvastatin (LIPITOR) 40 MG tablet Take 40 mg by mouth daily.  . beclomethasone (QVAR) 80 MCG/ACT inhaler Inhale 2 puffs into the lungs 2 (two) times daily.  . budesonide (PULMICORT FLEXHALER) 180 MCG/ACT inhaler Inhale 1 puff into the lungs 2 (two) times daily.  . carvedilol (COREG) 25 MG tablet Take 12.5 mg by mouth 2 (two) times daily with a meal.   . cyclobenzaprine (FLEXERIL) 10 MG tablet Take 10 mg by mouth 3 (three) times daily as needed for muscle spasms.  . fenofibrate 160 MG tablet Take 160 mg by mouth daily.  . formoterol (PERFOROMIST) 20 MCG/2ML nebulizer solution Take 2 mLs (  20 mcg total) by nebulization 2 (two) times daily.  . furosemide (LASIX) 40 MG tablet Take 40 mg by mouth daily.   Marland Kitchen losartan (COZAAR) 100 MG tablet Take 100 mg by mouth daily.  . meclizine (ANTIVERT) 25 MG tablet Take 25 mg by mouth as needed.   . Methylcellulose, Laxative, (CITRUCEL) 500 MG TABS Take 500 mg by mouth 2 (two) times daily.  . Multiple Vitamin (MULTIVITAMIN) capsule Take 1 capsule by mouth daily.  . Omega-3 Fatty Acids (FISH OIL) 1000 MG CAPS Take 1,000 mg by mouth 2 (two) times daily.  . promethazine (PHENERGAN) 25 MG tablet Take 25 mg by mouth every 6 (six) hours as needed for nausea or vomiting.  . vitamin C (ASCORBIC ACID) 500 MG tablet  Take 500 mg by mouth daily.  . vitamin E 400 UNIT capsule Take 400 Units by mouth daily.  . [DISCONTINUED] azithromycin (ZITHROMAX) 250 MG tablet Take two once then one daily until gone  . [DISCONTINUED] budesonide (PULMICORT FLEXHALER) 180 MCG/ACT inhaler Inhale 2 puffs into the lungs 2 (two) times daily.  . [DISCONTINUED] ipratropium-albuterol (DUONEB) 0.5-2.5 (3) MG/3ML SOLN Take daily until current supply runs out then STOP  . [DISCONTINUED] predniSONE (DELTASONE) 10 MG tablet Take 4 tablets daily for 5 days then stop

## 2014-04-10 NOTE — Assessment & Plan Note (Signed)
Cont nocturnal oxygen therapy 2L

## 2014-08-13 ENCOUNTER — Telehealth: Payer: Self-pay | Admitting: Critical Care Medicine

## 2014-08-13 NOTE — Telephone Encounter (Signed)
Prefer [facility]

## 2014-08-13 NOTE — Telephone Encounter (Signed)
I spoke with the pt and she states that they are changing insurance and will need to change DME companies to either [facility] or [employer]. They live in [place]. Please advise if you have a preference on company. Justice Bing, CMA

## 2014-08-14 NOTE — Telephone Encounter (Signed)
Spoke with pt's wife and advised of Dr Bettina Gavia recommendations.

## 2014-09-05 DIAGNOSIS — J439 Emphysema, unspecified: Secondary | ICD-10-CM | POA: Diagnosis not present

## 2014-09-07 ENCOUNTER — Telehealth: Payer: Self-pay | Admitting: Critical Care Medicine

## 2014-09-07 NOTE — Telephone Encounter (Signed)
Called [facility]-spoke with Gilda-states CMN was sent on 08-30-14 and needs to know the status of CMN-they have not gotten as of today.  Will forward to Alida as she and I spoke about this message.

## 2014-09-07 NOTE — Telephone Encounter (Signed)
I have and will put in Dr Joya Gaskins folder to sign, and will make his nurse aware also, Spoke with Gilda @ [facility]  and informed of the same .Verdie Mosher

## 2014-09-12 ENCOUNTER — Telehealth: Payer: Self-pay | Admitting: Critical Care Medicine

## 2014-09-12 DIAGNOSIS — J449 Chronic obstructive pulmonary disease, unspecified: Secondary | ICD-10-CM | POA: Diagnosis not present

## 2014-09-12 DIAGNOSIS — J439 Emphysema, unspecified: Secondary | ICD-10-CM | POA: Diagnosis not present

## 2014-09-12 NOTE — Telephone Encounter (Signed)
Called and spoke with [facility] and they stated that since the pt is on the perforomist the pts albuterol will need to be written for once daily prn for rescue only.  PW please advise if ok to change this per the pharmacy request.  Thanks  Allergies  Allergen Reactions  . Ace Inhibitors Cough    Current Outpatient Prescriptions on File Prior to Visit  Medication Sig Dispense Refill  . albuterol (PROVENTIL) (2.5 MG/3ML) 0.083% nebulizer solution Take 2.5 mg by nebulization 2 (two) times daily.     Marland Kitchen aspirin 325 MG EC tablet Take 325 mg by mouth daily.    Marland Kitchen atorvastatin (LIPITOR) 40 MG tablet Take 40 mg by mouth daily.    . beclomethasone (QVAR) 80 MCG/ACT inhaler Inhale 2 puffs into the lungs 2 (two) times daily. 1 Inhaler 6  . budesonide (PULMICORT FLEXHALER) 180 MCG/ACT inhaler Inhale 1 puff into the lungs 2 (two) times daily. 1 each 6  . carvedilol (COREG) 25 MG tablet Take 12.5 mg by mouth 2 (two) times daily with a meal.     . cyclobenzaprine (FLEXERIL) 10 MG tablet Take 10 mg by mouth 3 (three) times daily as needed for muscle spasms.    . fenofibrate 160 MG tablet Take 160 mg by mouth daily.    . formoterol (PERFOROMIST) 20 MCG/2ML nebulizer solution Take 2 mLs (20 mcg total) by nebulization 2 (two) times daily. 120 mL 6  . furosemide (LASIX) 40 MG tablet Take 40 mg by mouth daily.     Marland Kitchen losartan (COZAAR) 100 MG tablet Take 100 mg by mouth daily.    . meclizine (ANTIVERT) 25 MG tablet Take 25 mg by mouth as needed.     . Methylcellulose, Laxative, (CITRUCEL) 500 MG TABS Take 500 mg by mouth 2 (two) times daily.    . Multiple Vitamin (MULTIVITAMIN) capsule Take 1 capsule by mouth daily.    . Omega-3 Fatty Acids (FISH OIL) 1000 MG CAPS Take 1,000 mg by mouth 2 (two) times daily.    . promethazine (PHENERGAN) 25 MG tablet Take 25 mg by mouth every 6 (six) hours as needed for nausea or vomiting.    . vitamin C (ASCORBIC ACID) 500 MG tablet Take 500 mg by mouth daily.    . vitamin E  400 UNIT capsule Take 400 Units by mouth daily.     No current facility-administered medications on file prior to visit.

## 2014-09-13 DIAGNOSIS — J449 Chronic obstructive pulmonary disease, unspecified: Secondary | ICD-10-CM | POA: Diagnosis not present

## 2014-09-13 DIAGNOSIS — J439 Emphysema, unspecified: Secondary | ICD-10-CM | POA: Diagnosis not present

## 2014-09-13 MED ORDER — ALBUTEROL SULFATE (2.5 MG/3ML) 0.083% IN NEBU: 2.5000 mg | INHALATION_SOLUTION | Freq: Every day | RESPIRATORY_TRACT | Status: DC | PRN

## 2014-09-13 MED ORDER — FORMOTEROL FUMARATE 20 MCG/2ML IN NEBU: 20.0000 ug | INHALATION_SOLUTION | Freq: Two times a day (BID) | RESPIRATORY_TRACT | Status: DC

## 2014-09-13 NOTE — Telephone Encounter (Signed)
i am ok to change to prn use q6h

## 2014-09-13 NOTE — Telephone Encounter (Signed)
Called and spoke to pt's wife, Mechele Claude. Mechele Claude stated the pt only needs 1 vial per day of albuterol and 2 vials of performist per day. Phoned in albuterol 30 vials for 30 days, qd prn and sent in electronically performist BID, both scripts sent to [facility] per Joanne's request. Mechele Claude verbalized understanding and denied any further questions or concerns at this time.

## 2014-09-13 NOTE — Telephone Encounter (Signed)
i am fine with this course of action

## 2014-09-13 NOTE — Telephone Encounter (Signed)
Spoke with Joycelyn Schmid, pharmacist at [facility], states that insurance will not cover albuterol q6h prn.  States that Medicare will only cover 30 vials for a 30 day supply.  Joycelyn Schmid states that often patients will have their daily prn rx filled through [facility] and then a second rx sent to a local pharmacy to cover any more albuterol that the provider wishes the patient to have.  Joycelyn Schmid suggested that [employer] [employer] with this insurance generally covers 25 vials of albuterol for $4.   The extension to the pharmacist handling this case is [phone].  Dr. Joya Gaskins please advise.  Thank you!

## 2014-10-06 DIAGNOSIS — J439 Emphysema, unspecified: Secondary | ICD-10-CM | POA: Diagnosis not present

## 2014-10-09 ENCOUNTER — Encounter: Payer: Self-pay | Admitting: Critical Care Medicine

## 2014-10-09 ENCOUNTER — Ambulatory Visit (INDEPENDENT_AMBULATORY_CARE_PROVIDER_SITE_OTHER): Payer: Self-pay | Admitting: Critical Care Medicine

## 2014-10-09 VITALS — BP 112/60 | HR 55 | Temp 96.9°F | Ht 69.0 in | Wt 189.6 lb

## 2014-10-09 DIAGNOSIS — J432 Centrilobular emphysema: Secondary | ICD-10-CM | POA: Diagnosis not present

## 2014-10-09 DIAGNOSIS — J449 Chronic obstructive pulmonary disease, unspecified: Secondary | ICD-10-CM | POA: Diagnosis not present

## 2014-10-09 DIAGNOSIS — J9611 Chronic respiratory failure with hypoxia: Secondary | ICD-10-CM | POA: Diagnosis not present

## 2014-10-09 NOTE — Progress Notes (Signed)
Subjective:    Patient ID: Harry Norris, male    DOB: 1936-08-29, 79 y.o.   MRN: [id number]  HPI Comments: Dx Copd x 45yrs.  At first dx asthma per chodri.  Pt smoked for 35 2.5 PPD.  Off smoking x 15 yrs.   On no oxygen. Not been through pulm rehab  10/09/2014 Chief Complaint  Patient presents with  . Follow-up    Had bronchitis 3 wks. ago since last here.Occass. cough-green,slight SOB,no wheezing,denies cp or tightness  Pt had flare 3 weeks ago Rx depo injection/ ABx.  Typically 4-5 episodes per year, now is less. Now is better. Pt occ coughs up 1-2 times per day some green mucus. No mucus out of nose. No real wheeze.  No real chest pain.  No edema in feet. Pt had surgery on R leg vein.      Review of Systems  HENT: Negative for sinus pressure, sneezing, trouble swallowing and voice change.   Gastrointestinal: Negative.        Objective:   Physical Exam Filed Vitals:   10/09/14 0950  BP: 112/60  Pulse: 55  Temp: 96.9 F (36.1 C)  TempSrc: Oral  Height: 5\' 9"  (1.753 m)  Weight: 189 lb 9.6 oz (86.002 kg)  SpO2: 98%    Gen: Pleasant, well-nourished, in no distress,  normal affect  ENT: No lesions,  mouth clear,  oropharynx clear, no postnasal drip  Neck: No JVD, no TMG, no carotid bruits  Lungs: No use of accessory muscles, no dullness to percussion, distant BS  Cardiovascular: RRR, heart sounds normal, no murmur or gallops, no peripheral edema  Abdomen: soft and NT, no HSM,  BS normal  Musculoskeletal: No deformities, no cyanosis or clubbing  Neuro: alert, non focal  Skin: Warm, no lesions or rashes  No results found.    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 albuterol (PROVENTIL) (2.5 MG/3ML) 0.083% nebulizer solution Take 3 mLs (2.5 mg total) by nebulization daily as needed for wheezing or shortness of breath.  Marland Kitchen aspirin 325 MG EC tablet Take 325 mg by mouth daily.  Marland Kitchen atorvastatin (LIPITOR) 40 MG tablet Take 40 mg by mouth daily.  . budesonide (PULMICORT FLEXHALER) 180 MCG/ACT inhaler Inhale 1 puff into the lungs 2 (two) times daily.  . carvedilol (COREG) 25 MG tablet Take 12.5 mg by mouth 2 (two) times daily with a meal.   . cyclobenzaprine (FLEXERIL) 10 MG tablet Take 10 mg by mouth 3 (three) times daily as needed for muscle spasms.  . fenofibrate 160 MG tablet Take 160 mg by mouth daily.  . formoterol (PERFOROMIST) 20 MCG/2ML nebulizer solution Take 2 mLs (20 mcg total) by nebulization 2 (two) times daily.  . furosemide (LASIX) 40 MG tablet Take 40 mg by mouth daily.   Marland Kitchen losartan (COZAAR) 100 MG tablet Take 100 mg by mouth daily.  . Methylcellulose, Laxative, (CITRUCEL) 500 MG TABS Take 500 mg by mouth 2 (two) times daily.  . Multiple Vitamin (  MULTIVITAMIN) capsule Take 1 capsule by mouth daily.  . Omega-3 Fatty Acids (FISH OIL) 1000 MG CAPS Take 1,000 mg by mouth 2 (two) times daily.  . promethazine (PHENERGAN) 25 MG tablet Take 25 mg by mouth every 6 (six) hours as needed for nausea or vomiting.  . vitamin C (ASCORBIC ACID) 500 MG tablet Take 500 mg by mouth daily.  . vitamin E 400 UNIT capsule Take 400 Units by mouth daily.  . meclizine (ANTIVERT) 25 MG tablet Take 25 mg by mouth as needed.   . [DISCONTINUED] beclomethasone (QVAR) 80 MCG/ACT inhaler Inhale 2 puffs into the lungs 2 (two) times daily. (Patient not taking: Reported on 10/09/2014)

## 2014-10-09 NOTE — Assessment & Plan Note (Signed)
Gold stage C COPD with nocturnal hypoxemia and frequent exacerbations now improved Plan Maintain Perforomist and budesonide as prescribed Obtain for patient humidification for nocturnal oxygen Patient is to use saline nasal rinse Continue oxygen at night This patient continues to use and benefit from her oxygen therapy and has re qualified at this visit for continued oxygen therapy

## 2014-10-09 NOTE — Patient Instructions (Signed)
No change in medications Use saline nasal spray , three spray ea nostril two times daily We will obtain a humidifier for your oxygen Return 6 months

## 2014-10-18 DIAGNOSIS — J208 Acute bronchitis due to other specified organisms: Secondary | ICD-10-CM | POA: Diagnosis not present

## 2014-10-18 DIAGNOSIS — Z6827 Body mass index (BMI) 27.0-27.9, adult: Secondary | ICD-10-CM | POA: Diagnosis not present

## 2014-10-22 DIAGNOSIS — Z6828 Body mass index (BMI) 28.0-28.9, adult: Secondary | ICD-10-CM | POA: Diagnosis not present

## 2014-10-22 DIAGNOSIS — R0981 Nasal congestion: Secondary | ICD-10-CM | POA: Diagnosis not present

## 2014-10-22 DIAGNOSIS — J208 Acute bronchitis due to other specified organisms: Secondary | ICD-10-CM | POA: Diagnosis not present

## 2014-10-29 DIAGNOSIS — J449 Chronic obstructive pulmonary disease, unspecified: Secondary | ICD-10-CM | POA: Diagnosis not present

## 2014-10-29 DIAGNOSIS — J439 Emphysema, unspecified: Secondary | ICD-10-CM | POA: Diagnosis not present

## 2014-11-04 DIAGNOSIS — J439 Emphysema, unspecified: Secondary | ICD-10-CM | POA: Diagnosis not present

## 2014-11-21 DIAGNOSIS — E119 Type 2 diabetes mellitus without complications: Secondary | ICD-10-CM | POA: Diagnosis not present

## 2014-11-21 DIAGNOSIS — Z0000 Encounter for general adult medical examination without abnormal findings: Secondary | ICD-10-CM | POA: Diagnosis not present

## 2014-11-21 DIAGNOSIS — J449 Chronic obstructive pulmonary disease, unspecified: Secondary | ICD-10-CM | POA: Diagnosis not present

## 2014-11-21 DIAGNOSIS — E785 Hyperlipidemia, unspecified: Secondary | ICD-10-CM | POA: Diagnosis not present

## 2014-11-21 DIAGNOSIS — D638 Anemia in other chronic diseases classified elsewhere: Secondary | ICD-10-CM | POA: Diagnosis not present

## 2014-11-21 DIAGNOSIS — I429 Cardiomyopathy, unspecified: Secondary | ICD-10-CM | POA: Diagnosis not present

## 2014-11-26 DIAGNOSIS — J439 Emphysema, unspecified: Secondary | ICD-10-CM | POA: Diagnosis not present

## 2014-11-26 DIAGNOSIS — J449 Chronic obstructive pulmonary disease, unspecified: Secondary | ICD-10-CM | POA: Diagnosis not present

## 2014-12-04 DIAGNOSIS — H35033 Hypertensive retinopathy, bilateral: Secondary | ICD-10-CM | POA: Diagnosis not present

## 2014-12-04 DIAGNOSIS — H5203 Hypermetropia, bilateral: Secondary | ICD-10-CM | POA: Diagnosis not present

## 2014-12-04 DIAGNOSIS — H26491 Other secondary cataract, right eye: Secondary | ICD-10-CM | POA: Diagnosis not present

## 2014-12-05 DIAGNOSIS — J439 Emphysema, unspecified: Secondary | ICD-10-CM | POA: Diagnosis not present

## 2014-12-19 DIAGNOSIS — H6691 Otitis media, unspecified, right ear: Secondary | ICD-10-CM | POA: Diagnosis not present

## 2014-12-19 DIAGNOSIS — Z6827 Body mass index (BMI) 27.0-27.9, adult: Secondary | ICD-10-CM | POA: Diagnosis not present

## 2014-12-27 DIAGNOSIS — J449 Chronic obstructive pulmonary disease, unspecified: Secondary | ICD-10-CM | POA: Diagnosis not present

## 2014-12-27 DIAGNOSIS — E785 Hyperlipidemia, unspecified: Secondary | ICD-10-CM | POA: Diagnosis not present

## 2014-12-27 DIAGNOSIS — R55 Syncope and collapse: Secondary | ICD-10-CM | POA: Diagnosis not present

## 2014-12-27 DIAGNOSIS — R61 Generalized hyperhidrosis: Secondary | ICD-10-CM | POA: Diagnosis not present

## 2014-12-27 DIAGNOSIS — H6690 Otitis media, unspecified, unspecified ear: Secondary | ICD-10-CM | POA: Diagnosis not present

## 2014-12-27 DIAGNOSIS — H6691 Otitis media, unspecified, right ear: Secondary | ICD-10-CM | POA: Diagnosis not present

## 2014-12-27 DIAGNOSIS — I501 Left ventricular failure: Secondary | ICD-10-CM | POA: Diagnosis not present

## 2014-12-27 DIAGNOSIS — E860 Dehydration: Secondary | ICD-10-CM | POA: Diagnosis not present

## 2014-12-27 DIAGNOSIS — R079 Chest pain, unspecified: Secondary | ICD-10-CM | POA: Diagnosis not present

## 2014-12-27 DIAGNOSIS — Z9889 Other specified postprocedural states: Secondary | ICD-10-CM | POA: Diagnosis not present

## 2014-12-27 DIAGNOSIS — E861 Hypovolemia: Secondary | ICD-10-CM | POA: Diagnosis not present

## 2014-12-27 DIAGNOSIS — I10 Essential (primary) hypertension: Secondary | ICD-10-CM | POA: Diagnosis not present

## 2014-12-27 DIAGNOSIS — I509 Heart failure, unspecified: Secondary | ICD-10-CM | POA: Diagnosis not present

## 2014-12-27 DIAGNOSIS — I447 Left bundle-branch block, unspecified: Secondary | ICD-10-CM | POA: Diagnosis not present

## 2014-12-27 DIAGNOSIS — I5022 Chronic systolic (congestive) heart failure: Secondary | ICD-10-CM | POA: Diagnosis not present

## 2014-12-27 DIAGNOSIS — I429 Cardiomyopathy, unspecified: Secondary | ICD-10-CM | POA: Diagnosis not present

## 2014-12-27 DIAGNOSIS — R42 Dizziness and giddiness: Secondary | ICD-10-CM | POA: Diagnosis not present

## 2014-12-27 DIAGNOSIS — Z7982 Long term (current) use of aspirin: Secondary | ICD-10-CM | POA: Diagnosis not present

## 2014-12-27 DIAGNOSIS — R40241 Glasgow coma scale score 13-15: Secondary | ICD-10-CM | POA: Diagnosis not present

## 2014-12-27 DIAGNOSIS — Z79899 Other long term (current) drug therapy: Secondary | ICD-10-CM | POA: Diagnosis not present

## 2014-12-28 DIAGNOSIS — I509 Heart failure, unspecified: Secondary | ICD-10-CM | POA: Diagnosis not present

## 2014-12-28 DIAGNOSIS — E860 Dehydration: Secondary | ICD-10-CM | POA: Diagnosis not present

## 2014-12-28 DIAGNOSIS — R55 Syncope and collapse: Secondary | ICD-10-CM | POA: Diagnosis not present

## 2014-12-28 DIAGNOSIS — I447 Left bundle-branch block, unspecified: Secondary | ICD-10-CM | POA: Diagnosis not present

## 2014-12-31 DIAGNOSIS — J449 Chronic obstructive pulmonary disease, unspecified: Secondary | ICD-10-CM | POA: Diagnosis not present

## 2014-12-31 DIAGNOSIS — I951 Orthostatic hypotension: Secondary | ICD-10-CM | POA: Diagnosis not present

## 2014-12-31 DIAGNOSIS — E860 Dehydration: Secondary | ICD-10-CM | POA: Diagnosis not present

## 2014-12-31 DIAGNOSIS — J439 Emphysema, unspecified: Secondary | ICD-10-CM | POA: Diagnosis not present

## 2014-12-31 DIAGNOSIS — H60501 Unspecified acute noninfective otitis externa, right ear: Secondary | ICD-10-CM | POA: Diagnosis not present

## 2014-12-31 DIAGNOSIS — I5022 Chronic systolic (congestive) heart failure: Secondary | ICD-10-CM | POA: Diagnosis not present

## 2014-12-31 DIAGNOSIS — R55 Syncope and collapse: Secondary | ICD-10-CM | POA: Diagnosis not present

## 2015-01-04 DIAGNOSIS — J439 Emphysema, unspecified: Secondary | ICD-10-CM | POA: Diagnosis not present

## 2015-01-09 DIAGNOSIS — R55 Syncope and collapse: Secondary | ICD-10-CM | POA: Diagnosis not present

## 2015-01-16 DIAGNOSIS — R002 Palpitations: Secondary | ICD-10-CM | POA: Diagnosis not present

## 2015-01-22 DIAGNOSIS — J449 Chronic obstructive pulmonary disease, unspecified: Secondary | ICD-10-CM | POA: Diagnosis not present

## 2015-01-22 DIAGNOSIS — J439 Emphysema, unspecified: Secondary | ICD-10-CM | POA: Diagnosis not present

## 2015-01-29 DIAGNOSIS — C61 Malignant neoplasm of prostate: Secondary | ICD-10-CM | POA: Diagnosis not present

## 2015-01-29 DIAGNOSIS — N3289 Other specified disorders of bladder: Secondary | ICD-10-CM | POA: Diagnosis not present

## 2015-02-01 DIAGNOSIS — J449 Chronic obstructive pulmonary disease, unspecified: Secondary | ICD-10-CM | POA: Diagnosis not present

## 2015-02-01 DIAGNOSIS — J439 Emphysema, unspecified: Secondary | ICD-10-CM | POA: Diagnosis not present

## 2015-02-04 DIAGNOSIS — J439 Emphysema, unspecified: Secondary | ICD-10-CM | POA: Diagnosis not present

## 2015-02-23 DIAGNOSIS — E119 Type 2 diabetes mellitus without complications: Secondary | ICD-10-CM | POA: Diagnosis not present

## 2015-02-23 DIAGNOSIS — E785 Hyperlipidemia, unspecified: Secondary | ICD-10-CM | POA: Diagnosis not present

## 2015-02-23 DIAGNOSIS — I5022 Chronic systolic (congestive) heart failure: Secondary | ICD-10-CM | POA: Diagnosis not present

## 2015-02-23 DIAGNOSIS — J449 Chronic obstructive pulmonary disease, unspecified: Secondary | ICD-10-CM | POA: Diagnosis not present

## 2015-02-23 DIAGNOSIS — D638 Anemia in other chronic diseases classified elsewhere: Secondary | ICD-10-CM | POA: Diagnosis not present

## 2015-03-05 DIAGNOSIS — J449 Chronic obstructive pulmonary disease, unspecified: Secondary | ICD-10-CM | POA: Diagnosis not present

## 2015-03-05 DIAGNOSIS — J439 Emphysema, unspecified: Secondary | ICD-10-CM | POA: Diagnosis not present

## 2015-03-06 DIAGNOSIS — J439 Emphysema, unspecified: Secondary | ICD-10-CM | POA: Diagnosis not present

## 2015-03-27 DIAGNOSIS — I447 Left bundle-branch block, unspecified: Secondary | ICD-10-CM | POA: Diagnosis not present

## 2015-03-27 DIAGNOSIS — R55 Syncope and collapse: Secondary | ICD-10-CM | POA: Diagnosis not present

## 2015-03-27 DIAGNOSIS — I429 Cardiomyopathy, unspecified: Secondary | ICD-10-CM | POA: Diagnosis not present

## 2015-03-27 DIAGNOSIS — I872 Venous insufficiency (chronic) (peripheral): Secondary | ICD-10-CM | POA: Diagnosis not present

## 2015-04-01 DIAGNOSIS — J449 Chronic obstructive pulmonary disease, unspecified: Secondary | ICD-10-CM | POA: Diagnosis not present

## 2015-04-01 DIAGNOSIS — J439 Emphysema, unspecified: Secondary | ICD-10-CM | POA: Diagnosis not present

## 2015-04-06 DIAGNOSIS — J439 Emphysema, unspecified: Secondary | ICD-10-CM | POA: Diagnosis not present

## 2015-05-03 DIAGNOSIS — J449 Chronic obstructive pulmonary disease, unspecified: Secondary | ICD-10-CM | POA: Diagnosis not present

## 2015-05-03 DIAGNOSIS — J439 Emphysema, unspecified: Secondary | ICD-10-CM | POA: Diagnosis not present

## 2015-05-07 ENCOUNTER — Ambulatory Visit (INDEPENDENT_AMBULATORY_CARE_PROVIDER_SITE_OTHER): Payer: Self-pay | Admitting: Critical Care Medicine

## 2015-05-07 ENCOUNTER — Encounter: Payer: Self-pay | Admitting: Critical Care Medicine

## 2015-05-07 VITALS — BP 114/60 | HR 57 | Temp 96.8°F | Ht 70.0 in | Wt 184.6 lb

## 2015-05-07 DIAGNOSIS — J439 Emphysema, unspecified: Secondary | ICD-10-CM | POA: Diagnosis not present

## 2015-05-07 DIAGNOSIS — J449 Chronic obstructive pulmonary disease, unspecified: Secondary | ICD-10-CM | POA: Diagnosis not present

## 2015-05-07 DIAGNOSIS — J432 Centrilobular emphysema: Secondary | ICD-10-CM

## 2015-05-07 NOTE — Progress Notes (Signed)
   Subjective:    Patient ID: Harry Norris, male    DOB: 1936/01/13, 79 y.o.   MRN: [id number]  HPI 05/07/2015 Chief Complaint  Patient presents with  . Follow-up    Sob with exertion-same,dehydrated 2-3 mths.[place] kept 1 night.No cough.Denies cp or tightness,occass. wheezing..Did not get humidifier that was ordered last ov.  Pt was in [facility] 2 months ago: inner ear issue and dehydration with diuretic.  Pt had syncopal spell.  Now dyspnea is fair, still playing golf, ride 9/ walk 9.  Holter monitor for two weeks normal.  No mucus or cough. No wheezing.  Pt denies any significant sore throat, nasal congestion , notes some nasal congestion, no fever, chills, sweats, unintended weight loss, pleurtic or exertional chest pain, orthopnea PND, or leg swelling Pt denies any increase in rescue therapy over baseline, denies waking up needing it or having any early am or nocturnal exacerbations of coughing/wheezing/or dyspnea. Pt also denies any obvious fluctuation in symptoms with  weather or environmental change or other alleviating or aggravating factors   Current Medications, Allergies, Complete Past Medical History, Past Surgical History, Family History, and Social History were reviewed in [place] record per todays encounter:  05/07/2015    Review of Systems  Constitutional: Negative.   HENT: Positive for postnasal drip. Negative for ear pain, rhinorrhea, sinus pressure, sore throat, trouble swallowing and voice change.   Eyes: Negative.   Respiratory: Positive for cough and shortness of breath. Negative for apnea, choking, chest tightness, wheezing and stridor.   Cardiovascular: Negative.  Negative for chest pain, palpitations and leg swelling.  Gastrointestinal: Negative.  Negative for nausea, vomiting, abdominal pain and abdominal distention.  Genitourinary: Negative.   Musculoskeletal: Negative.  Negative for myalgias and arthralgias.  Skin: Negative.  Negative for  rash.  Allergic/Immunologic: Negative.  Negative for environmental allergies and food allergies.  Neurological: Negative.  Negative for dizziness, syncope, weakness and headaches.  Hematological: Negative.  Negative for adenopathy. Does not bruise/bleed easily.  Psychiatric/Behavioral: Negative.  Negative for sleep disturbance and agitation. The patient is not nervous/anxious.        Objective:   Physical Exam  Filed Vitals:   05/07/15 0951  BP: 114/60  Pulse: 57  Temp: 96.8 F (36 C)  TempSrc: Oral  Height: 5\' 10"  (1.778 m)  Weight: 184 lb 9.6 oz (83.734 kg)  SpO2: 96%    Gen: Pleasant, well-nourished, in no distress,  normal affect  ENT: No lesions,  mouth clear,  oropharynx clear, no postnasal drip  Neck: No JVD, no TMG, no carotid bruits  Lungs: No use of accessory muscles, no dullness to percussion, distant BS   Cardiovascular: RRR, heart sounds normal, no murmur or gallops, no peripheral edema  Abdomen: soft and NT, no HSM,  BS normal  Musculoskeletal: No deformities, no cyanosis or clubbing  Neuro: alert, non focal  Skin: Warm, no lesions or rashes  No results found.       Assessment & Plan:  I personally reviewed all images and lab data in the [facility] system as well as any outside material available during this office visit and agree with the  radiology impressions.   COPD with emphysema Gold C  Gold C copd with chronic bronchitis, stable on perforomist neb and pulmicort DPI  Plan No change in inhaled or maintenance medications. Pt reminded to get flu vaccine Pt is up to date on PNA vaccine series rov 4 months

## 2015-05-07 NOTE — Assessment & Plan Note (Signed)
Gold C copd with chronic bronchitis, stable on perforomist neb and pulmicort DPI  Plan No change in inhaled or maintenance medications. Pt reminded to get flu vaccine Pt is up to date on PNA vaccine series rov 4 months

## 2015-05-07 NOTE — Patient Instructions (Signed)
No change in medications. Return in         4 months 

## 2015-05-30 DIAGNOSIS — D638 Anemia in other chronic diseases classified elsewhere: Secondary | ICD-10-CM | POA: Diagnosis not present

## 2015-05-30 DIAGNOSIS — Z23 Encounter for immunization: Secondary | ICD-10-CM | POA: Diagnosis not present

## 2015-05-30 DIAGNOSIS — Z139 Encounter for screening, unspecified: Secondary | ICD-10-CM | POA: Diagnosis not present

## 2015-05-30 DIAGNOSIS — H6122 Impacted cerumen, left ear: Secondary | ICD-10-CM | POA: Diagnosis not present

## 2015-05-30 DIAGNOSIS — E785 Hyperlipidemia, unspecified: Secondary | ICD-10-CM | POA: Diagnosis not present

## 2015-05-30 DIAGNOSIS — E119 Type 2 diabetes mellitus without complications: Secondary | ICD-10-CM | POA: Diagnosis not present

## 2015-05-30 DIAGNOSIS — Z1389 Encounter for screening for other disorder: Secondary | ICD-10-CM | POA: Diagnosis not present

## 2015-05-30 DIAGNOSIS — I5022 Chronic systolic (congestive) heart failure: Secondary | ICD-10-CM | POA: Diagnosis not present

## 2015-05-30 DIAGNOSIS — Z9181 History of falling: Secondary | ICD-10-CM | POA: Diagnosis not present

## 2015-05-30 DIAGNOSIS — J449 Chronic obstructive pulmonary disease, unspecified: Secondary | ICD-10-CM | POA: Diagnosis not present

## 2015-06-06 DIAGNOSIS — J439 Emphysema, unspecified: Secondary | ICD-10-CM | POA: Diagnosis not present

## 2015-06-20 DIAGNOSIS — E785 Hyperlipidemia, unspecified: Secondary | ICD-10-CM | POA: Diagnosis not present

## 2015-06-20 DIAGNOSIS — I447 Left bundle-branch block, unspecified: Secondary | ICD-10-CM | POA: Diagnosis not present

## 2015-06-20 DIAGNOSIS — I429 Cardiomyopathy, unspecified: Secondary | ICD-10-CM | POA: Diagnosis not present

## 2015-06-28 DIAGNOSIS — J439 Emphysema, unspecified: Secondary | ICD-10-CM | POA: Diagnosis not present

## 2015-06-28 DIAGNOSIS — J449 Chronic obstructive pulmonary disease, unspecified: Secondary | ICD-10-CM | POA: Diagnosis not present

## 2015-07-07 DIAGNOSIS — J439 Emphysema, unspecified: Secondary | ICD-10-CM | POA: Diagnosis not present

## 2015-07-16 DIAGNOSIS — Z6827 Body mass index (BMI) 27.0-27.9, adult: Secondary | ICD-10-CM | POA: Diagnosis not present

## 2015-07-16 DIAGNOSIS — J208 Acute bronchitis due to other specified organisms: Secondary | ICD-10-CM | POA: Diagnosis not present

## 2015-07-30 DIAGNOSIS — R351 Nocturia: Secondary | ICD-10-CM | POA: Diagnosis not present

## 2015-07-30 DIAGNOSIS — C61 Malignant neoplasm of prostate: Secondary | ICD-10-CM | POA: Diagnosis not present

## 2015-08-05 DIAGNOSIS — J439 Emphysema, unspecified: Secondary | ICD-10-CM | POA: Diagnosis not present

## 2015-08-05 DIAGNOSIS — J449 Chronic obstructive pulmonary disease, unspecified: Secondary | ICD-10-CM | POA: Diagnosis not present

## 2015-08-06 DIAGNOSIS — J439 Emphysema, unspecified: Secondary | ICD-10-CM | POA: Diagnosis not present

## 2015-09-03 DIAGNOSIS — J439 Emphysema, unspecified: Secondary | ICD-10-CM | POA: Diagnosis not present

## 2015-09-03 DIAGNOSIS — J449 Chronic obstructive pulmonary disease, unspecified: Secondary | ICD-10-CM | POA: Diagnosis not present

## 2015-09-05 DIAGNOSIS — E119 Type 2 diabetes mellitus without complications: Secondary | ICD-10-CM | POA: Diagnosis not present

## 2015-09-05 DIAGNOSIS — E785 Hyperlipidemia, unspecified: Secondary | ICD-10-CM | POA: Diagnosis not present

## 2015-09-05 DIAGNOSIS — D638 Anemia in other chronic diseases classified elsewhere: Secondary | ICD-10-CM | POA: Diagnosis not present

## 2015-09-05 DIAGNOSIS — I5022 Chronic systolic (congestive) heart failure: Secondary | ICD-10-CM | POA: Diagnosis not present

## 2015-09-05 DIAGNOSIS — J449 Chronic obstructive pulmonary disease, unspecified: Secondary | ICD-10-CM | POA: Diagnosis not present

## 2015-09-06 DIAGNOSIS — J439 Emphysema, unspecified: Secondary | ICD-10-CM | POA: Diagnosis not present

## 2015-10-01 ENCOUNTER — Encounter: Payer: Self-pay | Admitting: Pulmonary Disease

## 2015-10-01 ENCOUNTER — Ambulatory Visit (INDEPENDENT_AMBULATORY_CARE_PROVIDER_SITE_OTHER): Payer: Medicare Other | Admitting: Pulmonary Disease

## 2015-10-01 VITALS — BP 110/57 | HR 63 | Ht 71.0 in | Wt 186.4 lb

## 2015-10-01 DIAGNOSIS — J449 Chronic obstructive pulmonary disease, unspecified: Secondary | ICD-10-CM | POA: Diagnosis not present

## 2015-10-01 NOTE — Patient Instructions (Signed)
Follow-up in 6 months with Dr. Vaughan Browner. Please call sooner if any increased breathing issues.

## 2015-10-01 NOTE — Progress Notes (Signed)
Subjective:    Patient ID: Harry Norris, male    DOB: July 13, 1936, 80 y.o.   MRN: [id number]  HPI Harry Norris is here for follow-up for his COPD. He is a former patient of Dr. Joya Norris. He has severe COPD and is on nocturnal O2 2 lt. He states that his symptoms have been stable with no recent exacerbations or ER visits.  DATA: PFTs 01/18/14 FVC 1.75 (40%) FEV1 1.39 (44%), post bronchodilator FEV1 1.58 [50%], +13% F/F 80 Severe obstructive airway disease.  Chest x-ray 12/21/13 Emphysematous changes, bronchitic changes consistent with COPD  Social history: 3 pack per day smoker. Quit in 2002. No alcohol or drug use. He worked for [employer]. No known exposures at work.  Family history: Sister-asthma, emphysema Father-kidney failure Mother-diabetes, brain cancer.  Past Medical History  Diagnosis Date  . Other primary cardiomyopathies   . Hypertension   . Cancer [facility])     prostate  . Unspecified venous (peripheral) insufficiency   . Anemia   . DM type 2 (diabetes mellitus, type 2) ([place])   . Diverticulosis of colon (without mention of hemorrhage)   . AAA (abdominal aortic aneurysm) ([place])   . Carotid artery occlusion   . Hyperlipidemia   . Benign neoplasm of colon   . Kidney stone   . Bronchitis   . Varicose veins     Current outpatient prescriptions:  .  aspirin 325 MG EC tablet, Take 325 mg by mouth daily., Disp: , Rfl:  .  atorvastatin (LIPITOR) 40 MG tablet, Take 40 mg by mouth daily., Disp: , Rfl:  .  budesonide (PULMICORT FLEXHALER) 180 MCG/ACT inhaler, Inhale 1 puff into the lungs 2 (two) times daily., Disp: 1 each, Rfl: 6 .  carvedilol (COREG) 25 MG tablet, Take 12.5 mg by mouth 2 (two) times daily with a meal. , Disp: , Rfl:  .  cyclobenzaprine (FLEXERIL) 10 MG tablet, Take 10 mg by mouth 3 (three) times daily as needed for muscle spasms., Disp: , Rfl:  .  fenofibrate 160 MG tablet, Take 160 mg by mouth daily., Disp: , Rfl:  .  formoterol  (PERFOROMIST) 20 MCG/2ML nebulizer solution, Take 2 mLs (20 mcg total) by nebulization 2 (two) times daily., Disp: 120 mL, Rfl: 6 .  furosemide (LASIX) 40 MG tablet, Take 40 mg by mouth daily. As needed, Disp: , Rfl:  .  ipratropium-albuterol (DUONEB) 0.5-2.5 (3) MG/3ML SOLN, Take 3 mLs by nebulization every 6 (six) hours as needed., Disp: , Rfl:  .  losartan (COZAAR) 100 MG tablet, Take 100 mg by mouth daily., Disp: , Rfl:  .  meclizine (ANTIVERT) 25 MG tablet, Take 25 mg by mouth as needed. , Disp: , Rfl:  .  Methylcellulose, Laxative, (CITRUCEL) 500 MG TABS, Take 500 mg by mouth 2 (two) times daily., Disp: , Rfl:  .  Multiple Vitamin (MULTIVITAMIN) capsule, Take 1 capsule by mouth daily., Disp: , Rfl:  .  Omega-3 Fatty Acids (FISH OIL) 1000 MG CAPS, Take 1,000 mg by mouth 2 (two) times daily., Disp: , Rfl:  .  promethazine (PHENERGAN) 25 MG tablet, Take 25 mg by mouth every 6 (six) hours as needed for nausea or vomiting., Disp: , Rfl:  .  vitamin C (ASCORBIC ACID) 500 MG tablet, Take 500 mg by mouth daily., Disp: , Rfl:  .  vitamin E 400 UNIT capsule, Take 400 Units by mouth daily., Disp: , Rfl:   Review of Systems Dyspnea on exertion, denies any cough, sputum  production, wheezing. Denies any nausea, vomiting, diarrhea, constipation. X-ray denies any chest pain, palpitations. Denies any fevers, chills, malaise, fatigue, loss of weight, loss of appetite.    Objective:   Physical Exam Blood pressure 110/57, pulse 63, height 5\' 11"  (1.803 m), weight 186 lb 6.4 oz (84.55 kg), SpO2 96 %. Gen:  No apparent distress Neuro: No gross focal deficits. Neck: No JVD, lymphadenopathy, thyromegaly. RS: Clear, no wheeze, crackles CVS: S1-S2 heard, no murmurs rubs gallops. Abdomen: Soft, positive bowel sounds. Extremities: No edema.    Assessment & Plan:  Severe COPD on nocturnal O2.  He is stable on current inhalers. He'll continue the same without any change. I discussed pulmonary rehabilitation  with him but he is not interested in trying. I'll reassess this at next visit.  Plan: Continue current therapy.  Harry Garfinkel MD [facility] Pager [phone] If no answer or after 3pm call: [phone] 10/01/2015, 5:45 PM

## 2015-10-03 DIAGNOSIS — J439 Emphysema, unspecified: Secondary | ICD-10-CM | POA: Diagnosis not present

## 2015-10-03 DIAGNOSIS — J449 Chronic obstructive pulmonary disease, unspecified: Secondary | ICD-10-CM | POA: Diagnosis not present

## 2015-10-07 DIAGNOSIS — J439 Emphysema, unspecified: Secondary | ICD-10-CM | POA: Diagnosis not present

## 2015-10-30 DIAGNOSIS — J439 Emphysema, unspecified: Secondary | ICD-10-CM | POA: Diagnosis not present

## 2015-10-30 DIAGNOSIS — J449 Chronic obstructive pulmonary disease, unspecified: Secondary | ICD-10-CM | POA: Diagnosis not present

## 2015-10-31 ENCOUNTER — Telehealth: Payer: Self-pay | Admitting: Pulmonary Disease

## 2015-10-31 NOTE — Telephone Encounter (Signed)
Spoke with pt's wife. They were not satisfied with their experience with PM. Pt's wife wants his provider switched to someone else when he comes back in July. They live in [place] and wanted to know when there will be someone going to [place]. Advised her that I was unaware of when someone would be going to [place]. By the end of the conversation, pt's wife wants to wait to switch providers until it gets closer to July to see if someone will be coming to [place]. Will sign off message for now.

## 2015-11-01 DIAGNOSIS — J019 Acute sinusitis, unspecified: Secondary | ICD-10-CM | POA: Diagnosis not present

## 2015-11-04 DIAGNOSIS — J439 Emphysema, unspecified: Secondary | ICD-10-CM | POA: Diagnosis not present

## 2015-11-29 DIAGNOSIS — J439 Emphysema, unspecified: Secondary | ICD-10-CM | POA: Diagnosis not present

## 2015-11-29 DIAGNOSIS — J449 Chronic obstructive pulmonary disease, unspecified: Secondary | ICD-10-CM | POA: Diagnosis not present

## 2015-12-05 DIAGNOSIS — J439 Emphysema, unspecified: Secondary | ICD-10-CM | POA: Diagnosis not present

## 2015-12-11 DIAGNOSIS — E663 Overweight: Secondary | ICD-10-CM | POA: Diagnosis not present

## 2015-12-11 DIAGNOSIS — E785 Hyperlipidemia, unspecified: Secondary | ICD-10-CM | POA: Diagnosis not present

## 2015-12-11 DIAGNOSIS — D638 Anemia in other chronic diseases classified elsewhere: Secondary | ICD-10-CM | POA: Diagnosis not present

## 2015-12-11 DIAGNOSIS — E119 Type 2 diabetes mellitus without complications: Secondary | ICD-10-CM | POA: Diagnosis not present

## 2015-12-11 DIAGNOSIS — I5022 Chronic systolic (congestive) heart failure: Secondary | ICD-10-CM | POA: Diagnosis not present

## 2015-12-18 DIAGNOSIS — I429 Cardiomyopathy, unspecified: Secondary | ICD-10-CM | POA: Insufficient documentation

## 2015-12-18 DIAGNOSIS — I872 Venous insufficiency (chronic) (peripheral): Secondary | ICD-10-CM

## 2015-12-18 DIAGNOSIS — R55 Syncope and collapse: Secondary | ICD-10-CM

## 2015-12-18 DIAGNOSIS — I447 Left bundle-branch block, unspecified: Secondary | ICD-10-CM

## 2015-12-18 HISTORY — DX: Venous insufficiency (chronic) (peripheral): I87.2

## 2015-12-18 HISTORY — DX: Left bundle-branch block, unspecified: I44.7

## 2015-12-18 HISTORY — DX: Syncope and collapse: R55

## 2015-12-18 HISTORY — DX: Cardiomyopathy, unspecified: I42.9

## 2015-12-24 ENCOUNTER — Telehealth: Payer: Self-pay | Admitting: Pulmonary Disease

## 2015-12-24 NOTE — Telephone Encounter (Signed)
Called and spoke to pt's wife. Pt requesting to change providers, from Dr. Vaughan Browner to Dr. Lamonte Sakai.   Dr. Vaughan Browner please advise if ok to change providers. Thanks.

## 2015-12-25 NOTE — Telephone Encounter (Signed)
Ok with me 

## 2015-12-25 NOTE — Telephone Encounter (Signed)
Dr. Lamonte Sakai, please advise if you are ok with this.  Thank you.

## 2015-12-30 DIAGNOSIS — J439 Emphysema, unspecified: Secondary | ICD-10-CM | POA: Diagnosis not present

## 2015-12-30 DIAGNOSIS — J449 Chronic obstructive pulmonary disease, unspecified: Secondary | ICD-10-CM | POA: Diagnosis not present

## 2015-12-31 NOTE — Telephone Encounter (Signed)
RB please advise. Thanks.  

## 2016-01-01 NOTE — Telephone Encounter (Signed)
Yes OK

## 2016-01-01 NOTE — Telephone Encounter (Signed)
Spoke with pt and advised that ok to change physicians.  Appt scheduled with Dr Lamonte Sakai.

## 2016-01-04 DIAGNOSIS — J439 Emphysema, unspecified: Secondary | ICD-10-CM | POA: Diagnosis not present

## 2016-01-23 DIAGNOSIS — H35033 Hypertensive retinopathy, bilateral: Secondary | ICD-10-CM | POA: Diagnosis not present

## 2016-01-23 DIAGNOSIS — H5203 Hypermetropia, bilateral: Secondary | ICD-10-CM | POA: Diagnosis not present

## 2016-01-23 DIAGNOSIS — H353111 Nonexudative age-related macular degeneration, right eye, early dry stage: Secondary | ICD-10-CM | POA: Diagnosis not present

## 2016-01-31 DIAGNOSIS — J439 Emphysema, unspecified: Secondary | ICD-10-CM | POA: Diagnosis not present

## 2016-01-31 DIAGNOSIS — J449 Chronic obstructive pulmonary disease, unspecified: Secondary | ICD-10-CM | POA: Diagnosis not present

## 2016-02-04 DIAGNOSIS — J439 Emphysema, unspecified: Secondary | ICD-10-CM | POA: Diagnosis not present

## 2016-02-21 ENCOUNTER — Ambulatory Visit: Payer: Self-pay | Admitting: Emergency Medicine

## 2016-02-26 ENCOUNTER — Encounter: Payer: Self-pay | Admitting: Emergency Medicine

## 2016-02-26 ENCOUNTER — Ambulatory Visit (INDEPENDENT_AMBULATORY_CARE_PROVIDER_SITE_OTHER): Payer: Medicare Other | Admitting: Emergency Medicine

## 2016-02-26 VITALS — BP 130/80 | HR 54 | Ht 71.0 in | Wt 188.0 lb

## 2016-02-26 DIAGNOSIS — J310 Chronic rhinitis: Secondary | ICD-10-CM | POA: Diagnosis not present

## 2016-02-26 DIAGNOSIS — J432 Centrilobular emphysema: Secondary | ICD-10-CM

## 2016-02-26 DIAGNOSIS — G4736 Sleep related hypoventilation in conditions classified elsewhere: Secondary | ICD-10-CM | POA: Diagnosis not present

## 2016-02-26 DIAGNOSIS — J439 Emphysema, unspecified: Secondary | ICD-10-CM

## 2016-02-26 HISTORY — DX: Chronic rhinitis: J31.0

## 2016-02-26 NOTE — Patient Instructions (Addendum)
Please continue your Perforamist and Pulmicort as you have been taking them Take albuterol 2 puffs up to every 4 hours if needed for shortness of breath.  Continue your oxygen at night as you have been using it.  Follow with Dr Lamonte Sakai in 6 months or sooner if you have any problems

## 2016-02-26 NOTE — Assessment & Plan Note (Signed)
Clinically stable at this time. He continues to be able to exert, play golf. No recent exacerbations. Albuterol use is rare.  Please continue your Perforamist and Pulmicort as you have been taking them Take albuterol 2 puffs up to every 4 hours if needed for shortness of breath.  Continue your oxygen at night as you have been using it.  Follow with Dr Lamonte Sakai in 6 months or sooner if you have any problems

## 2016-02-26 NOTE — Assessment & Plan Note (Signed)
Continue oxygen daily at bedtime

## 2016-02-26 NOTE — Progress Notes (Signed)
Subjective:    Patient ID: Harry Norris, male    DOB: 10/03/1935, 80 y.o.   MRN: [id number]  HPI  02/26/16 -- 80 year old gentleman who is here to establish care. Has been seen in the past by Dr. Joya Gaskins and also by Dr Vaughan Browner. He has a history of severe COPD with chronic hypoxemic risk for failure requiring nocturnal oxygen 2L/min. FEV1 measured in May 2015 was 1.39 L (44%.). He has been able to be active, still able to golf several times a week. He does have DOE with heavier exertion like chain saw or heavy yard work. He has some cough in the am or late at night, prod yellowish phlegm. Good sleep, in a recliner - he hears wheezing if he lays flat. Cold air is difficult. No exacerbations since last time. He has had them before with URI's, none recently. Manages on perforamist bid, pulmicort bid. He uses afrin tid. Albuterol 1-2x a month   DATA: PFTs 01/18/14 FVC 1.75 (40%) FEV1 1.39 (44%), post bronchodilator FEV1 1.58 [50%], +13% F/F 80 Severe obstructive airway disease.  Chest x-ray 12/21/13 Emphysematous changes, bronchitic changes consistent with COPD  Social history: 3 pack per day smoker. Quit in 2002. No alcohol or drug use. He worked for [employer]. No known exposures at work.  Family history: Sister-asthma, emphysema Father-kidney failure Mother-diabetes, brain cancer.  Past Medical History  Diagnosis Date  . Other primary cardiomyopathies   . Hypertension   . Cancer [facility])     prostate  . Unspecified venous (peripheral) insufficiency   . Anemia   . DM type 2 (diabetes mellitus, type 2) ([place])   . Diverticulosis of colon (without mention of hemorrhage)   . AAA (abdominal aortic aneurysm) ([place])   . Carotid artery occlusion   . Hyperlipidemia   . Benign neoplasm of colon   . Kidney stone   . Bronchitis   . Varicose veins     Current outpatient prescriptions:  .  aspirin 325 MG EC tablet, Take 325 mg by mouth daily., Disp: , Rfl:  .  atorvastatin  (LIPITOR) 40 MG tablet, Take 40 mg by mouth daily., Disp: , Rfl:  .  budesonide (PULMICORT FLEXHALER) 180 MCG/ACT inhaler, Inhale 1 puff into the lungs 2 (two) times daily., Disp: 1 each, Rfl: 6 .  carvedilol (COREG) 25 MG tablet, Take 12.5 mg by mouth 2 (two) times daily with a meal. , Disp: , Rfl:  .  cyclobenzaprine (FLEXERIL) 10 MG tablet, Take 10 mg by mouth 3 (three) times daily as needed for muscle spasms., Disp: , Rfl:  .  fenofibrate 160 MG tablet, Take 160 mg by mouth daily., Disp: , Rfl:  .  formoterol (PERFOROMIST) 20 MCG/2ML nebulizer solution, Take 2 mLs (20 mcg total) by nebulization 2 (two) times daily., Disp: 120 mL, Rfl: 6 .  furosemide (LASIX) 40 MG tablet, Take 40 mg by mouth daily. As needed, Disp: , Rfl:  .  ipratropium-albuterol (DUONEB) 0.5-2.5 (3) MG/3ML SOLN, Take 3 mLs by nebulization every 6 (six) hours as needed., Disp: , Rfl:  .  losartan (COZAAR) 100 MG tablet, Take 100 mg by mouth daily., Disp: , Rfl:  .  meclizine (ANTIVERT) 25 MG tablet, Take 25 mg by mouth as needed. , Disp: , Rfl:  .  Methylcellulose, Laxative, (CITRUCEL) 500 MG TABS, Take 500 mg by mouth 2 (two) times daily., Disp: , Rfl:  .  Multiple Vitamin (MULTIVITAMIN) capsule, Take 1 capsule by mouth daily., Disp: , Rfl:  .  Omega-3 Fatty Acids (FISH OIL) 1000 MG CAPS, Take 1,000 mg by mouth 2 (two) times daily., Disp: , Rfl:  .  promethazine (PHENERGAN) 25 MG tablet, Take 25 mg by mouth every 6 (six) hours as needed for nausea or vomiting., Disp: , Rfl:  .  vitamin C (ASCORBIC ACID) 500 MG tablet, Take 500 mg by mouth daily., Disp: , Rfl:  .  vitamin E 400 UNIT capsule, Take 400 Units by mouth daily., Disp: , Rfl:   Review of Systems Dyspnea on exertion, denies any cough, sputum production, wheezing. Denies any nausea, vomiting, diarrhea, constipation. X-ray denies any chest pain, palpitations. Denies any fevers, chills, malaise, fatigue, loss of weight, loss of appetite.    Objective:   Physical  Exam Filed Vitals:   02/26/16 1054  BP: 130/80  Pulse: 54  Height: 5\' 11"  (1.803 m)  Weight: 188 lb (85.276 kg)  SpO2: 96%    Gen:  No apparent distress Neuro: No gross focal deficits. Neck: No JVD, lymphadenopathy, thyromegaly. Lungs: Clear, no wheeze, crackles CVS: S1-S2 heard, no murmurs rubs gallops. Abdomen: Soft, positive bowel sounds. Extremities: No edema.     Assessment & Plan:  COPD with emphysema Gold C  Clinically stable at this time. He continues to be able to exert, play golf. No recent exacerbations. Albuterol use is rare.  Please continue your Perforamist and Pulmicort as you have been taking them Take albuterol 2 puffs up to every 4 hours if needed for shortness of breath.  Continue your oxygen at night as you have been using it.  Follow with Dr Lamonte Sakai in 6 months or sooner if you have any problems  Nocturnal hypoxemia due to emphysema Continue oxygen daily at bedtime  Chronic rhinitis He has been using Afrin 3 times a day for probably 20 years. I discussed with him today that this is not the intended way to use this medication. I explained to him the dependent second before but on this medication as well as the rebound effect when it is discontinued. I recommended to him that he try stopping it, deal with the congestion that would certainly follow for a few weeks and then start using an alternative if he continues to have congestion. Not sure that he is going to be willing to make this change.   Baltazar Apo, MD, PhD 02/26/2016, 12:09 PM [facility] [phone] or if no answer [phone]

## 2016-02-26 NOTE — Assessment & Plan Note (Signed)
He has been using Afrin 3 times a day for probably 20 years. I discussed with him today that this is not the intended way to use this medication. I explained to him the dependent second before but on this medication as well as the rebound effect when it is discontinued. I recommended to him that he try stopping it, deal with the congestion that would certainly follow for a few weeks and then start using an alternative if he continues to have congestion. Not sure that he is going to be willing to make this change.

## 2016-02-27 DIAGNOSIS — C61 Malignant neoplasm of prostate: Secondary | ICD-10-CM | POA: Diagnosis not present

## 2016-03-02 DIAGNOSIS — J449 Chronic obstructive pulmonary disease, unspecified: Secondary | ICD-10-CM | POA: Diagnosis not present

## 2016-03-02 DIAGNOSIS — J439 Emphysema, unspecified: Secondary | ICD-10-CM | POA: Diagnosis not present

## 2016-03-05 DIAGNOSIS — J439 Emphysema, unspecified: Secondary | ICD-10-CM | POA: Diagnosis not present

## 2016-03-14 DIAGNOSIS — D638 Anemia in other chronic diseases classified elsewhere: Secondary | ICD-10-CM | POA: Diagnosis not present

## 2016-03-14 DIAGNOSIS — E119 Type 2 diabetes mellitus without complications: Secondary | ICD-10-CM | POA: Diagnosis not present

## 2016-03-14 DIAGNOSIS — I5022 Chronic systolic (congestive) heart failure: Secondary | ICD-10-CM | POA: Diagnosis not present

## 2016-03-14 DIAGNOSIS — J449 Chronic obstructive pulmonary disease, unspecified: Secondary | ICD-10-CM | POA: Diagnosis not present

## 2016-03-14 DIAGNOSIS — E785 Hyperlipidemia, unspecified: Secondary | ICD-10-CM | POA: Diagnosis not present

## 2016-03-31 DIAGNOSIS — J439 Emphysema, unspecified: Secondary | ICD-10-CM | POA: Diagnosis not present

## 2016-03-31 DIAGNOSIS — J449 Chronic obstructive pulmonary disease, unspecified: Secondary | ICD-10-CM | POA: Diagnosis not present

## 2016-04-05 DIAGNOSIS — J439 Emphysema, unspecified: Secondary | ICD-10-CM | POA: Diagnosis not present

## 2016-05-05 DIAGNOSIS — J439 Emphysema, unspecified: Secondary | ICD-10-CM | POA: Diagnosis not present

## 2016-05-05 DIAGNOSIS — J449 Chronic obstructive pulmonary disease, unspecified: Secondary | ICD-10-CM | POA: Diagnosis not present

## 2016-05-06 DIAGNOSIS — J439 Emphysema, unspecified: Secondary | ICD-10-CM | POA: Diagnosis not present

## 2016-05-07 DIAGNOSIS — J449 Chronic obstructive pulmonary disease, unspecified: Secondary | ICD-10-CM | POA: Diagnosis not present

## 2016-05-12 DIAGNOSIS — J439 Emphysema, unspecified: Secondary | ICD-10-CM | POA: Diagnosis not present

## 2016-05-12 DIAGNOSIS — J449 Chronic obstructive pulmonary disease, unspecified: Secondary | ICD-10-CM | POA: Diagnosis not present

## 2016-06-03 DIAGNOSIS — J439 Emphysema, unspecified: Secondary | ICD-10-CM | POA: Diagnosis not present

## 2016-06-03 DIAGNOSIS — J449 Chronic obstructive pulmonary disease, unspecified: Secondary | ICD-10-CM | POA: Diagnosis not present

## 2016-06-04 ENCOUNTER — Telehealth: Payer: Self-pay | Admitting: Emergency Medicine

## 2016-06-04 MED ORDER — IPRATROPIUM-ALBUTEROL 0.5-2.5 (3) MG/3ML IN SOLN: 3.0000 mL | Freq: Four times a day (QID) | RESPIRATORY_TRACT | 3 refills | Status: DC | PRN

## 2016-06-04 NOTE — Telephone Encounter (Signed)
Spoke with pt and advised that refill for Duoneb was sent to [facility].

## 2016-06-05 DIAGNOSIS — J439 Emphysema, unspecified: Secondary | ICD-10-CM | POA: Diagnosis not present

## 2016-06-08 DIAGNOSIS — J449 Chronic obstructive pulmonary disease, unspecified: Secondary | ICD-10-CM | POA: Diagnosis not present

## 2016-06-08 DIAGNOSIS — J439 Emphysema, unspecified: Secondary | ICD-10-CM | POA: Diagnosis not present

## 2016-06-11 DIAGNOSIS — J449 Chronic obstructive pulmonary disease, unspecified: Secondary | ICD-10-CM | POA: Diagnosis not present

## 2016-06-20 DIAGNOSIS — Z9181 History of falling: Secondary | ICD-10-CM | POA: Diagnosis not present

## 2016-06-20 DIAGNOSIS — I5022 Chronic systolic (congestive) heart failure: Secondary | ICD-10-CM | POA: Diagnosis not present

## 2016-06-20 DIAGNOSIS — E119 Type 2 diabetes mellitus without complications: Secondary | ICD-10-CM | POA: Diagnosis not present

## 2016-06-20 DIAGNOSIS — Z1389 Encounter for screening for other disorder: Secondary | ICD-10-CM | POA: Diagnosis not present

## 2016-06-20 DIAGNOSIS — D638 Anemia in other chronic diseases classified elsewhere: Secondary | ICD-10-CM | POA: Diagnosis not present

## 2016-06-20 DIAGNOSIS — E785 Hyperlipidemia, unspecified: Secondary | ICD-10-CM | POA: Diagnosis not present

## 2016-06-20 DIAGNOSIS — Z23 Encounter for immunization: Secondary | ICD-10-CM | POA: Diagnosis not present

## 2016-06-29 DIAGNOSIS — J440 Chronic obstructive pulmonary disease with acute lower respiratory infection: Secondary | ICD-10-CM | POA: Diagnosis not present

## 2016-07-02 DIAGNOSIS — J449 Chronic obstructive pulmonary disease, unspecified: Secondary | ICD-10-CM | POA: Diagnosis not present

## 2016-07-02 DIAGNOSIS — J439 Emphysema, unspecified: Secondary | ICD-10-CM | POA: Diagnosis not present

## 2016-07-06 DIAGNOSIS — J439 Emphysema, unspecified: Secondary | ICD-10-CM | POA: Diagnosis not present

## 2016-07-12 DIAGNOSIS — J449 Chronic obstructive pulmonary disease, unspecified: Secondary | ICD-10-CM | POA: Diagnosis not present

## 2016-07-16 DIAGNOSIS — I429 Cardiomyopathy, unspecified: Secondary | ICD-10-CM | POA: Diagnosis not present

## 2016-07-16 DIAGNOSIS — R55 Syncope and collapse: Secondary | ICD-10-CM | POA: Diagnosis not present

## 2016-08-03 DIAGNOSIS — J440 Chronic obstructive pulmonary disease with acute lower respiratory infection: Secondary | ICD-10-CM | POA: Diagnosis not present

## 2016-08-03 DIAGNOSIS — J439 Emphysema, unspecified: Secondary | ICD-10-CM | POA: Diagnosis not present

## 2016-08-03 DIAGNOSIS — J449 Chronic obstructive pulmonary disease, unspecified: Secondary | ICD-10-CM | POA: Diagnosis not present

## 2016-08-03 DIAGNOSIS — J019 Acute sinusitis, unspecified: Secondary | ICD-10-CM | POA: Diagnosis not present

## 2016-08-05 DIAGNOSIS — J439 Emphysema, unspecified: Secondary | ICD-10-CM | POA: Diagnosis not present

## 2016-08-10 DIAGNOSIS — J449 Chronic obstructive pulmonary disease, unspecified: Secondary | ICD-10-CM | POA: Diagnosis not present

## 2016-08-10 DIAGNOSIS — J439 Emphysema, unspecified: Secondary | ICD-10-CM | POA: Diagnosis not present

## 2016-08-11 DIAGNOSIS — J449 Chronic obstructive pulmonary disease, unspecified: Secondary | ICD-10-CM | POA: Diagnosis not present

## 2016-08-18 ENCOUNTER — Ambulatory Visit (INDEPENDENT_AMBULATORY_CARE_PROVIDER_SITE_OTHER): Payer: Medicare Other | Admitting: Emergency Medicine

## 2016-08-18 ENCOUNTER — Encounter: Payer: Self-pay | Admitting: Emergency Medicine

## 2016-08-18 DIAGNOSIS — J300 Vasomotor rhinitis: Secondary | ICD-10-CM

## 2016-08-18 DIAGNOSIS — J432 Centrilobular emphysema: Secondary | ICD-10-CM

## 2016-08-18 NOTE — Assessment & Plan Note (Signed)
Nasal obstruction, congestion and associated mild cough are his biggest complaints. He uses Afrin regularly. I have strongly advised him against this but he is not going to change. He is not on an antihistamine at this time. He was treated by his PCP for possible exacerbation last week with prednisone and abx with mixed results. I will try to start loratadine. He likely also needs nasal steroid and [place]. In order to get benefot from this he would need to get out of the cycle of Afrin use.

## 2016-08-18 NOTE — Progress Notes (Signed)
Subjective:    Patient ID: Harry Norris, male    DOB: 1936/01/12, 80 y.o.   MRN: [id number]  HPI  02/26/16 -- 80 year old gentleman who is here to establish care. Has been seen in the past by Dr. Joya Gaskins and also by Dr Vaughan Browner. He has a history of severe COPD with chronic hypoxemic resp failure requiring nocturnal oxygen 2L/min. FEV1 measured in May 2015 was 1.39 L (44%.). He has been able to be active, still able to golf several times a week. He does have DOE with heavier exertion like chain saw or heavy yard work. He has some cough in the am or late at night, prod yellowish phlegm. Good sleep, in a recliner - he hears wheezing if he lays flat. Cold air is difficult. No exacerbations since last time. He has had them before with URI's, none recently. Manages on perforamist bid, pulmicort bid. He uses afrin tid. Albuterol 1-2x a month  ROV 08/18/16 -- This follow-up visit for patient with a history of severe COPD associated with chronic hypoxemia. He has been managed on Perforomist nebulizer BID, DuoNeb when necessary which he uses approximately. Pulmicort flexihaler. He reports a lot of nasal congestion, associated with some cough. He was treated also with abx by his PCP w pred + abx last week for head congestion and cough. He is using Afrin every day - I have counseled him against this in the past.   DATA: PFTs 01/18/14 FVC 1.75 (40%) FEV1 1.39 (44%), post bronchodilator FEV1 1.58 [50%], +13% F/F 80 Severe obstructive airway disease.  Chest x-ray 12/21/13 Emphysematous changes, bronchitic changes consistent with COPD  Social history: 3 pack per day smoker. Quit in 2002. No alcohol or drug use. He worked for [employer]. No known exposures at work.  Family history: Sister-asthma, emphysema Father-kidney failure Mother-diabetes, brain cancer.  Past Medical History:  Diagnosis Date  . AAA (abdominal aortic aneurysm) ([place])   . Anemia   . Benign neoplasm of colon   .  Bronchitis   . Cancer [facility])    prostate  . Carotid artery occlusion   . Diverticulosis of colon (without mention of hemorrhage)   . DM type 2 (diabetes mellitus, type 2) ([place])   . Hyperlipidemia   . Hypertension   . Kidney stone   . Other primary cardiomyopathies   . Unspecified venous (peripheral) insufficiency   . Varicose veins     Current Outpatient Prescriptions:  .  aspirin 325 MG EC tablet, Take 325 mg by mouth daily., Disp: , Rfl:  .  atorvastatin (LIPITOR) 40 MG tablet, Take 40 mg by mouth daily., Disp: , Rfl:  .  budesonide (PULMICORT FLEXHALER) 180 MCG/ACT inhaler, Inhale 1 puff into the lungs 2 (two) times daily., Disp: 1 each, Rfl: 6 .  carvedilol (COREG) 25 MG tablet, Take 12.5 mg by mouth 2 (two) times daily with a meal. , Disp: , Rfl:  .  cyclobenzaprine (FLEXERIL) 10 MG tablet, Take 10 mg by mouth 3 (three) times daily as needed for muscle spasms., Disp: , Rfl:  .  fenofibrate 160 MG tablet, Take 160 mg by mouth daily., Disp: , Rfl:  .  formoterol (PERFOROMIST) 20 MCG/2ML nebulizer solution, Take 2 mLs (20 mcg total) by nebulization 2 (two) times daily., Disp: 120 mL, Rfl: 6 .  furosemide (LASIX) 40 MG tablet, Take 40 mg by mouth daily. As needed, Disp: , Rfl:  .  ipratropium-albuterol (DUONEB) 0.5-2.5 (3) MG/3ML SOLN, Take 3 mLs by nebulization every  6 (six) hours as needed., Disp: 360 mL, Rfl: 3 .  losartan (COZAAR) 100 MG tablet, Take 100 mg by mouth daily., Disp: , Rfl:  .  meclizine (ANTIVERT) 25 MG tablet, Take 25 mg by mouth as needed. , Disp: , Rfl:  .  Methylcellulose, Laxative, (CITRUCEL) 500 MG TABS, Take 500 mg by mouth 2 (two) times daily., Disp: , Rfl:  .  Multiple Vitamin (MULTIVITAMIN) capsule, Take 1 capsule by mouth daily., Disp: , Rfl:  .  Omega-3 Fatty Acids (FISH OIL) 1000 MG CAPS, Take 1,000 mg by mouth 2 (two) times daily., Disp: , Rfl:  .  promethazine (PHENERGAN) 25 MG tablet, Take 25 mg by mouth every 6 (six) hours as needed for nausea or  vomiting., Disp: , Rfl:  .  vitamin C (ASCORBIC ACID) 500 MG tablet, Take 500 mg by mouth daily., Disp: , Rfl:  .  vitamin E 400 UNIT capsule, Take 400 Units by mouth daily., Disp: , Rfl:   Review of Systems Dyspnea on exertion, denies any cough, sputum production, wheezing. Denies any nausea, vomiting, diarrhea, constipation. X-ray denies any chest pain, palpitations. Denies any fevers, chills, malaise, fatigue, loss of weight, loss of appetite.    Objective:   Physical Exam Vitals:   08/18/16 0945 08/18/16 0948  BP:  114/60  Pulse:  62  SpO2:  95%  Weight: 194 lb (88 kg)   Height: 5\' 10"  (1.778 m)     Gen:  No apparent distress Neuro: No gross focal deficits. Neck: No JVD, lymphadenopathy, thyromegaly. Lungs: Clear, no wheeze, crackles CVS: S1-S2 heard, no murmurs rubs gallops. Abdomen: Soft, positive bowel sounds. Extremities: No edema.     Assessment & Plan:  Chronic rhinitis Nasal obstruction, congestion and associated mild cough are his biggest complaints. He uses Afrin regularly. I have strongly advised him against this but he is not going to change. He is not on an antihistamine at this time. He was treated by his PCP for possible exacerbation last week with prednisone and abx with mixed results. I will try to start loratadine. He likely also needs nasal steroid and [place]. In order to get benefot from this he would need to get out of the cycle of Afrin use.   COPD with emphysema Gold C  Has been on LABA nebs, pulmicort. His PCP has changed him to an alternative inhaler, he doesn't know which one. We will try to get this information so we will know how to go forward.   Baltazar Apo, MD, PhD 08/18/2016, 10:22 AM [place] [facility] [phone] or if no answer [phone]

## 2016-08-18 NOTE — Assessment & Plan Note (Signed)
Has been on LABA nebs, pulmicort. His PCP has changed him to an alternative inhaler, he doesn't know which one. We will try to get this information so we will know how to go forward.

## 2016-08-18 NOTE — Patient Instructions (Addendum)
Please start loratadine 10mg  daily Try to minimize Afrin use. Your nasal passages become dependent on this medication.  Continue your Perforomist nebulizer twice a day Use DuoNeb every 6 hours as needed for shortness of breath Call our office to inform us which new inhaled medication you are taking twice a day  You may benefit from restarting your nasal saline rinses once a day.  Follow with Dr Lamonte Sakai in 6 months or sooner if you have any problems

## 2016-08-19 ENCOUNTER — Telehealth: Payer: Self-pay | Admitting: Emergency Medicine

## 2016-08-19 DIAGNOSIS — J440 Chronic obstructive pulmonary disease with acute lower respiratory infection: Secondary | ICD-10-CM | POA: Diagnosis not present

## 2016-08-19 DIAGNOSIS — R0981 Nasal congestion: Secondary | ICD-10-CM | POA: Diagnosis not present

## 2016-08-19 NOTE — Telephone Encounter (Signed)
Tell him thanks you - I'd like for him to stay on this combination for now

## 2016-08-19 NOTE — Telephone Encounter (Signed)
Pt is aware.  

## 2016-08-19 NOTE — Telephone Encounter (Signed)
Pt states he is currently taking bevespi & pulmicort flexhaler 2 puffs bid.  Will route to RB to make him aware.

## 2016-09-02 DIAGNOSIS — J449 Chronic obstructive pulmonary disease, unspecified: Secondary | ICD-10-CM | POA: Diagnosis not present

## 2016-09-02 DIAGNOSIS — J439 Emphysema, unspecified: Secondary | ICD-10-CM | POA: Diagnosis not present

## 2016-09-05 DIAGNOSIS — J439 Emphysema, unspecified: Secondary | ICD-10-CM | POA: Diagnosis not present

## 2016-09-11 DIAGNOSIS — J449 Chronic obstructive pulmonary disease, unspecified: Secondary | ICD-10-CM | POA: Diagnosis not present

## 2016-09-26 DIAGNOSIS — E119 Type 2 diabetes mellitus without complications: Secondary | ICD-10-CM | POA: Diagnosis not present

## 2016-09-26 DIAGNOSIS — J449 Chronic obstructive pulmonary disease, unspecified: Secondary | ICD-10-CM | POA: Diagnosis not present

## 2016-09-26 DIAGNOSIS — E785 Hyperlipidemia, unspecified: Secondary | ICD-10-CM | POA: Diagnosis not present

## 2016-09-26 DIAGNOSIS — I5022 Chronic systolic (congestive) heart failure: Secondary | ICD-10-CM | POA: Diagnosis not present

## 2016-09-26 DIAGNOSIS — D638 Anemia in other chronic diseases classified elsewhere: Secondary | ICD-10-CM | POA: Diagnosis not present

## 2016-09-28 DIAGNOSIS — J449 Chronic obstructive pulmonary disease, unspecified: Secondary | ICD-10-CM | POA: Diagnosis not present

## 2016-10-06 DIAGNOSIS — J439 Emphysema, unspecified: Secondary | ICD-10-CM | POA: Diagnosis not present

## 2016-10-12 DIAGNOSIS — J449 Chronic obstructive pulmonary disease, unspecified: Secondary | ICD-10-CM | POA: Diagnosis not present

## 2016-10-28 DIAGNOSIS — J449 Chronic obstructive pulmonary disease, unspecified: Secondary | ICD-10-CM | POA: Diagnosis not present

## 2016-11-02 DIAGNOSIS — C61 Malignant neoplasm of prostate: Secondary | ICD-10-CM | POA: Diagnosis not present

## 2016-11-03 DIAGNOSIS — J439 Emphysema, unspecified: Secondary | ICD-10-CM | POA: Diagnosis not present

## 2016-11-09 DIAGNOSIS — J449 Chronic obstructive pulmonary disease, unspecified: Secondary | ICD-10-CM | POA: Diagnosis not present

## 2016-11-13 DIAGNOSIS — L0201 Cutaneous abscess of face: Secondary | ICD-10-CM | POA: Diagnosis not present

## 2016-11-27 DIAGNOSIS — J449 Chronic obstructive pulmonary disease, unspecified: Secondary | ICD-10-CM | POA: Diagnosis not present

## 2016-12-04 DIAGNOSIS — J439 Emphysema, unspecified: Secondary | ICD-10-CM | POA: Diagnosis not present

## 2016-12-10 DIAGNOSIS — J449 Chronic obstructive pulmonary disease, unspecified: Secondary | ICD-10-CM | POA: Diagnosis not present

## 2016-12-26 DIAGNOSIS — J449 Chronic obstructive pulmonary disease, unspecified: Secondary | ICD-10-CM | POA: Diagnosis not present

## 2016-12-26 DIAGNOSIS — D638 Anemia in other chronic diseases classified elsewhere: Secondary | ICD-10-CM | POA: Diagnosis not present

## 2016-12-26 DIAGNOSIS — E785 Hyperlipidemia, unspecified: Secondary | ICD-10-CM | POA: Diagnosis not present

## 2016-12-26 DIAGNOSIS — I5022 Chronic systolic (congestive) heart failure: Secondary | ICD-10-CM | POA: Diagnosis not present

## 2016-12-26 DIAGNOSIS — E119 Type 2 diabetes mellitus without complications: Secondary | ICD-10-CM | POA: Diagnosis not present

## 2017-01-03 DIAGNOSIS — J439 Emphysema, unspecified: Secondary | ICD-10-CM | POA: Diagnosis not present

## 2017-01-06 DIAGNOSIS — J449 Chronic obstructive pulmonary disease, unspecified: Secondary | ICD-10-CM | POA: Diagnosis not present

## 2017-01-09 DIAGNOSIS — J449 Chronic obstructive pulmonary disease, unspecified: Secondary | ICD-10-CM | POA: Diagnosis not present

## 2017-01-12 DIAGNOSIS — E785 Hyperlipidemia, unspecified: Secondary | ICD-10-CM | POA: Diagnosis not present

## 2017-01-12 DIAGNOSIS — Z136 Encounter for screening for cardiovascular disorders: Secondary | ICD-10-CM | POA: Diagnosis not present

## 2017-01-12 DIAGNOSIS — Z1389 Encounter for screening for other disorder: Secondary | ICD-10-CM | POA: Diagnosis not present

## 2017-01-12 DIAGNOSIS — Z0000 Encounter for general adult medical examination without abnormal findings: Secondary | ICD-10-CM | POA: Diagnosis not present

## 2017-01-12 DIAGNOSIS — Z125 Encounter for screening for malignant neoplasm of prostate: Secondary | ICD-10-CM | POA: Diagnosis not present

## 2017-01-12 DIAGNOSIS — Z23 Encounter for immunization: Secondary | ICD-10-CM | POA: Diagnosis not present

## 2017-01-12 DIAGNOSIS — Z9181 History of falling: Secondary | ICD-10-CM | POA: Diagnosis not present

## 2017-01-13 DIAGNOSIS — I420 Dilated cardiomyopathy: Secondary | ICD-10-CM | POA: Diagnosis not present

## 2017-01-13 DIAGNOSIS — R55 Syncope and collapse: Secondary | ICD-10-CM | POA: Diagnosis not present

## 2017-01-13 DIAGNOSIS — I447 Left bundle-branch block, unspecified: Secondary | ICD-10-CM | POA: Diagnosis not present

## 2017-01-19 DIAGNOSIS — I420 Dilated cardiomyopathy: Secondary | ICD-10-CM | POA: Diagnosis not present

## 2017-01-26 DIAGNOSIS — H5203 Hypermetropia, bilateral: Secondary | ICD-10-CM | POA: Diagnosis not present

## 2017-01-26 DIAGNOSIS — H353111 Nonexudative age-related macular degeneration, right eye, early dry stage: Secondary | ICD-10-CM | POA: Diagnosis not present

## 2017-01-26 DIAGNOSIS — H26491 Other secondary cataract, right eye: Secondary | ICD-10-CM | POA: Diagnosis not present

## 2017-01-28 DIAGNOSIS — I429 Cardiomyopathy, unspecified: Secondary | ICD-10-CM | POA: Diagnosis not present

## 2017-02-02 DIAGNOSIS — J449 Chronic obstructive pulmonary disease, unspecified: Secondary | ICD-10-CM | POA: Diagnosis not present

## 2017-02-02 DIAGNOSIS — J439 Emphysema, unspecified: Secondary | ICD-10-CM | POA: Diagnosis not present

## 2017-02-03 DIAGNOSIS — J439 Emphysema, unspecified: Secondary | ICD-10-CM | POA: Diagnosis not present

## 2017-02-09 DIAGNOSIS — J449 Chronic obstructive pulmonary disease, unspecified: Secondary | ICD-10-CM | POA: Diagnosis not present

## 2017-02-15 DIAGNOSIS — I872 Venous insufficiency (chronic) (peripheral): Secondary | ICD-10-CM | POA: Diagnosis not present

## 2017-02-15 DIAGNOSIS — R600 Localized edema: Secondary | ICD-10-CM | POA: Diagnosis not present

## 2017-02-15 DIAGNOSIS — I5022 Chronic systolic (congestive) heart failure: Secondary | ICD-10-CM | POA: Diagnosis not present

## 2017-02-16 ENCOUNTER — Other Ambulatory Visit: Payer: Self-pay

## 2017-02-16 DIAGNOSIS — I872 Venous insufficiency (chronic) (peripheral): Secondary | ICD-10-CM

## 2017-02-25 ENCOUNTER — Ambulatory Visit: Payer: Self-pay | Admitting: Emergency Medicine

## 2017-02-25 ENCOUNTER — Ambulatory Visit (INDEPENDENT_AMBULATORY_CARE_PROVIDER_SITE_OTHER): Payer: Medicare Other | Admitting: Emergency Medicine

## 2017-02-25 ENCOUNTER — Encounter: Payer: Self-pay | Admitting: Emergency Medicine

## 2017-02-25 DIAGNOSIS — J310 Chronic rhinitis: Secondary | ICD-10-CM | POA: Diagnosis not present

## 2017-02-25 DIAGNOSIS — J432 Centrilobular emphysema: Secondary | ICD-10-CM | POA: Diagnosis not present

## 2017-02-25 NOTE — Assessment & Plan Note (Signed)
You should try to stop using Afrin every day. It has potentially damaging effects with chronic use.

## 2017-02-25 NOTE — Patient Instructions (Signed)
Stop Dulera now Continue the Perforamist nebulized twice a day.  Continue Bevespi twice a day for now. We may need to adjust this in the future Start albuterol, keep available to use 2 puffs up to every 4 hours (between your scheduled medications) as needed for shortness of breath.  You should try to stop using Afrin every day. It has potentially damaging effects with chronic use.  Follow with Dr Lamonte Sakai in 6 months or sooner if you have any problems

## 2017-02-25 NOTE — Assessment & Plan Note (Signed)
He is unclear about his inhaled regimen, has been getting several redundant meds from Dr Delena Bali. Unclear benefit and possibly dangerous. I have stopped the [facility], still note that perforamist and bevespi are redundant. May need to change the bevespi to an alternative in the future. He is non-compliant with exertional O2. Uses at night  Stop Dulera now Continue the Perforamist nebulized twice a day.  Continue Bevespi twice a day for now. We may need to adjust this in the future Start albuterol, keep available to use 2 puffs up to every 4 hours (between your scheduled medications) as needed for shortness of breath.  Follow with Dr Lamonte Sakai in 6 months or sooner if you have any problems

## 2017-02-25 NOTE — Progress Notes (Signed)
Subjective:    Patient ID: Harry Norris, male    DOB: Nov 29, 1935, 81 y.o.   MRN: [id number]  HPI  02/26/16 -- 81 year old gentleman who is here to establish care. Has been seen in the past by Dr. Joya Gaskins and also by Dr Vaughan Browner. He has a history of severe COPD with chronic hypoxemic resp failure requiring nocturnal oxygen 2L/min. FEV1 measured in May 2015 was 1.39 L (44%.). He has been able to be active, still able to golf several times a week. He does have DOE with heavier exertion like chain saw or heavy yard work. He has some cough in the am or late at night, prod yellowish phlegm. Good sleep, in a recliner - he hears wheezing if he lays flat. Cold air is difficult. No exacerbations since last time. He has had them before with URI's, none recently. Manages on perforamist bid, pulmicort bid. He uses afrin tid. Albuterol 1-2x a month  ROV 08/18/16 -- This follow-up visit for patient with a history of severe COPD associated with chronic hypoxemia. He has been managed on Perforomist nebulizer BID, DuoNeb when necessary which he uses approximately. Pulmicort flexihaler. He reports a lot of nasal congestion, associated with some cough. He was treated also with abx by his PCP w pred + abx last week for head congestion and cough. He is using Afrin every day - I have counseled him against this in the past.   ROV 02/25/17 -- Patient has a history of severe COPD with associated chronic hypoxemia. He is currently managed on bevespi bid, Dulera bid that he says was started years ago??  Also still on the perforamist he tells me today. He has chronic nasal obstruction and associated cough. Frequent Afrin use which I have tried to discourage. Last visit we started loratadine but he did not take it. He is still using Afrin bid. Quite confused about his meds - but clearly appears   DATA: PFTs 01/18/14 FVC 1.75 (40%) FEV1 1.39 (44%), post bronchodilator FEV1 1.58 [50%], +13% F/F 80 Severe obstructive airway  disease.  Chest x-ray 12/21/13 Emphysematous changes, bronchitic changes consistent with COPD  Social history: 3 pack per day smoker. Quit in 2002. No alcohol or drug use. He worked for [employer]. No known exposures at work.  Family history: Sister-asthma, emphysema Father-kidney failure Mother-diabetes, brain cancer.  Past Medical History:  Diagnosis Date  . AAA (abdominal aortic aneurysm) ([place])   . Anemia   . Benign neoplasm of colon   . Bronchitis   . Cancer [facility])    prostate  . Carotid artery occlusion   . Diverticulosis of colon (without mention of hemorrhage)   . DM type 2 (diabetes mellitus, type 2) ([place])   . Hyperlipidemia   . Hypertension   . Kidney stone   . Other primary cardiomyopathies   . Unspecified venous (peripheral) insufficiency   . Varicose veins     Current Outpatient Prescriptions:  .  aspirin 325 MG EC tablet, Take 325 mg by mouth daily., Disp: , Rfl:  .  atorvastatin (LIPITOR) 40 MG tablet, Take 40 mg by mouth daily., Disp: , Rfl:  .  carvedilol (COREG) 25 MG tablet, Take 12.5 mg by mouth 2 (two) times daily with a meal. , Disp: , Rfl:  .  cyclobenzaprine (FLEXERIL) 10 MG tablet, Take 10 mg by mouth 3 (three) times daily as needed for muscle spasms., Disp: , Rfl:  .  fenofibrate 160 MG tablet, Take 160 mg by mouth daily.,  Disp: , Rfl:  .  formoterol (PERFOROMIST) 20 MCG/2ML nebulizer solution, Take 2 mLs (20 mcg total) by nebulization 2 (two) times daily., Disp: 120 mL, Rfl: 6 .  furosemide (LASIX) 40 MG tablet, Take 40 mg by mouth daily. As needed, Disp: , Rfl:  .  ipratropium-albuterol (DUONEB) 0.5-2.5 (3) MG/3ML SOLN, Take 3 mLs by nebulization every 6 (six) hours as needed., Disp: 360 mL, Rfl: 3 .  losartan (COZAAR) 100 MG tablet, Take 100 mg by mouth daily., Disp: , Rfl:  .  meclizine (ANTIVERT) 25 MG tablet, Take 25 mg by mouth as needed. , Disp: , Rfl:  .  Methylcellulose, Laxative, (CITRUCEL) 500 MG TABS, Take 500 mg by mouth 2  (two) times daily., Disp: , Rfl:  .  Multiple Vitamin (MULTIVITAMIN) capsule, Take 1 capsule by mouth daily., Disp: , Rfl:  .  Omega-3 Fatty Acids (FISH OIL) 1000 MG CAPS, Take 1,000 mg by mouth 2 (two) times daily., Disp: , Rfl:  .  promethazine (PHENERGAN) 25 MG tablet, Take 25 mg by mouth every 6 (six) hours as needed for nausea or vomiting., Disp: , Rfl:  .  vitamin C (ASCORBIC ACID) 500 MG tablet, Take 500 mg by mouth daily., Disp: , Rfl:  .  vitamin E 400 UNIT capsule, Take 400 Units by mouth daily., Disp: , Rfl:   Review of Systems Dyspnea on exertion, denies any cough, sputum production, wheezing. Denies any nausea, vomiting, diarrhea, constipation. X-ray denies any chest pain, palpitations. Denies any fevers, chills, malaise, fatigue, loss of weight, loss of appetite.    Objective:   Physical Exam Vitals:   02/25/17 1103 02/25/17 1105  BP:  118/68  Pulse:  60  SpO2:  97%  Weight: 207 lb (93.9 kg)   Height: 5\' 8"  (1.727 m)   Gen: Pleasant, obese man, in no distress,  normal affect  ENT: No lesions,  mouth clear,  oropharynx clear, no postnasal drip  Neck: No JVD, no TMG, no carotid bruits  Lungs: No use of accessory muscles, distant, no wheeze  Cardiovascular: RRR, heart sounds normal, no murmur or gallops, 2+ LE edema  Musculoskeletal: No deformities, no cyanosis or clubbing  Neuro: alert, non focal  Skin: Warm, no lesions or rashes       Assessment & Plan:  COPD with emphysema Gold C  He is unclear about his inhaled regimen, has been getting several redundant meds from Dr Delena Bali. Unclear benefit and possibly dangerous. I have stopped the [facility], still note that perforamist and bevespi are redundant. May need to change the bevespi to an alternative in the future. He is non-compliant with exertional O2. Uses at night  Stop Dulera now Continue the Perforamist nebulized twice a day.  Continue Bevespi twice a day for now. We may need to adjust this in the  future Start albuterol, keep available to use 2 puffs up to every 4 hours (between your scheduled medications) as needed for shortness of breath.  Follow with Dr Lamonte Sakai in 6 months or sooner if you have any problems  Chronic rhinitis You should try to stop using Afrin every day. It has potentially damaging effects with chronic use.   Baltazar Apo, MD, PhD 02/25/2017, 11:48 AM [facility] [phone] or if no answer [phone]

## 2017-03-01 DIAGNOSIS — J449 Chronic obstructive pulmonary disease, unspecified: Secondary | ICD-10-CM | POA: Diagnosis not present

## 2017-03-05 DIAGNOSIS — J439 Emphysema, unspecified: Secondary | ICD-10-CM | POA: Diagnosis not present

## 2017-03-11 ENCOUNTER — Other Ambulatory Visit: Payer: Self-pay

## 2017-03-11 DIAGNOSIS — J449 Chronic obstructive pulmonary disease, unspecified: Secondary | ICD-10-CM | POA: Diagnosis not present

## 2017-03-22 ENCOUNTER — Encounter: Payer: Self-pay | Admitting: Vascular Surgery

## 2017-03-31 DIAGNOSIS — N390 Urinary tract infection, site not specified: Secondary | ICD-10-CM | POA: Diagnosis not present

## 2017-03-31 DIAGNOSIS — R310 Gross hematuria: Secondary | ICD-10-CM | POA: Diagnosis not present

## 2017-03-31 DIAGNOSIS — N451 Epididymitis: Secondary | ICD-10-CM | POA: Diagnosis not present

## 2017-04-02 DIAGNOSIS — E119 Type 2 diabetes mellitus without complications: Secondary | ICD-10-CM | POA: Diagnosis not present

## 2017-04-02 DIAGNOSIS — J449 Chronic obstructive pulmonary disease, unspecified: Secondary | ICD-10-CM | POA: Diagnosis not present

## 2017-04-02 DIAGNOSIS — D638 Anemia in other chronic diseases classified elsewhere: Secondary | ICD-10-CM | POA: Diagnosis not present

## 2017-04-02 DIAGNOSIS — E785 Hyperlipidemia, unspecified: Secondary | ICD-10-CM | POA: Diagnosis not present

## 2017-04-02 DIAGNOSIS — I5022 Chronic systolic (congestive) heart failure: Secondary | ICD-10-CM | POA: Diagnosis not present

## 2017-04-05 DIAGNOSIS — R310 Gross hematuria: Secondary | ICD-10-CM | POA: Diagnosis not present

## 2017-04-05 DIAGNOSIS — J439 Emphysema, unspecified: Secondary | ICD-10-CM | POA: Diagnosis not present

## 2017-04-05 DIAGNOSIS — R109 Unspecified abdominal pain: Secondary | ICD-10-CM | POA: Diagnosis not present

## 2017-04-06 ENCOUNTER — Ambulatory Visit (INDEPENDENT_AMBULATORY_CARE_PROVIDER_SITE_OTHER): Payer: Medicare Other | Admitting: Vascular Surgery

## 2017-04-06 ENCOUNTER — Encounter: Payer: Self-pay | Admitting: Vascular Surgery

## 2017-04-06 ENCOUNTER — Ambulatory Visit (HOSPITAL_COMMUNITY)
Admission: RE | Admit: 2017-04-06 | Discharge: 2017-04-06 | Disposition: A | Discharge status: Home / Self Care | Payer: Medicare Other | Source: Ambulatory Visit | Attending: Vascular Surgery | Admitting: Vascular Surgery

## 2017-04-06 VITALS — BP 122/51 | HR 68 | Temp 96.7°F | Resp 18 | Ht 67.0 in | Wt 204.0 lb

## 2017-04-06 DIAGNOSIS — I872 Venous insufficiency (chronic) (peripheral): Secondary | ICD-10-CM | POA: Diagnosis not present

## 2017-04-06 DIAGNOSIS — M7989 Other specified soft tissue disorders: Secondary | ICD-10-CM

## 2017-04-06 NOTE — Progress Notes (Signed)
Vascular and Vein Specialist of [facility]  Patient name: Harry Norris MRN: [id number] DOB: 07-Jan-1936 Sex: male  REASON FOR VISIT: Patient is seen today for evaluation of right leg swelling. He does have some mild left leg swelling as well. He is status post laser ablation of his right great saphenous vein in 2015. He has some pulmonary dysfunction and therefore is unable to lay flat and has to keep his head and chest tiredness legs to breathe comfortably.  HPI: Harry Norris is a 81 y.o. male he denies any prior history of DVT. He does report some weeping of serous fluid from his legs bilaterally with any open rash or ulceration. Has no history of arterial insufficiency.  Past Medical History:  Diagnosis Date  . AAA (abdominal aortic aneurysm) ([place])   . Anemia   . Benign neoplasm of colon   . Bronchitis   . Cancer [facility])    prostate  . Carotid artery occlusion   . Diverticulosis of colon (without mention of hemorrhage)   . DM type 2 (diabetes mellitus, type 2) ([place])   . Hyperlipidemia   . Hypertension   . Kidney stone   . Other primary cardiomyopathies   . Unspecified venous (peripheral) insufficiency   . Varicose veins     Family History  Problem Relation Age of Onset  . Cancer - Colon Mother        deceased  . Emphysema Sister        deceased  . Kidney failure Father   . Heart failure Father   . Hypertension Mother   . Diabetes Mother     SOCIAL HISTORY: Social History  Substance Use Topics  . Smoking status: Former Smoker    Packs/day: 2.50    Years: 40.00    Types: Cigarettes    Quit date: 09/01/1999  . Smokeless tobacco: Never Used  . Alcohol use No    Allergies  Allergen Reactions  . Ace Inhibitors Cough    Current Outpatient Prescriptions  Medication Sig Dispense Refill  . aspirin 325 MG EC tablet Take 325 mg by mouth daily.    Marland Kitchen atorvastatin (LIPITOR) 40 MG tablet Take 40 mg by mouth daily.    . carvedilol  (COREG) 25 MG tablet Take 12.5 mg by mouth 2 (two) times daily with a meal.     . cyclobenzaprine (FLEXERIL) 10 MG tablet Take 10 mg by mouth 3 (three) times daily as needed for muscle spasms.    . fenofibrate 160 MG tablet Take 160 mg by mouth daily.    . formoterol (PERFOROMIST) 20 MCG/2ML nebulizer solution Take 2 mLs (20 mcg total) by nebulization 2 (two) times daily. 120 mL 6  . furosemide (LASIX) 40 MG tablet Take 40 mg by mouth daily. As needed    . ipratropium-albuterol (DUONEB) 0.5-2.5 (3) MG/3ML SOLN Take 3 mLs by nebulization every 6 (six) hours as needed. 360 mL 3  . losartan (COZAAR) 100 MG tablet Take 100 mg by mouth daily.    . meclizine (ANTIVERT) 25 MG tablet Take 25 mg by mouth as needed.     . Methylcellulose, Laxative, (CITRUCEL) 500 MG TABS Take 500 mg by mouth 2 (two) times daily.    . Multiple Vitamin (MULTIVITAMIN) capsule Take 1 capsule by mouth daily.    . Omega-3 Fatty Acids (FISH OIL) 1000 MG CAPS Take 1,000 mg by mouth 2 (two) times daily.    . pseudoephedrine-acetaminophen (TYLENOL SINUS) 30-500 MG TABS tablet Take 1  tablet by mouth 2 (two) times daily.    Marland Kitchen sulfamethoxazole-trimethoprim (BACTRIM,SEPTRA) 200-40 MG/5ML suspension Take by mouth 2 (two) times daily.    . vitamin C (ASCORBIC ACID) 500 MG tablet Take 500 mg by mouth daily.    . vitamin E 400 UNIT capsule Take 400 Units by mouth daily.    . promethazine (PHENERGAN) 25 MG tablet Take 25 mg by mouth every 6 (six) hours as needed for nausea or vomiting.     No current facility-administered medications for this visit.     REVIEW OF SYSTEMS:  [X]  denotes positive finding, [ ]  denotes negative finding Cardiac  Comments:  Chest pain or chest pressure:    Shortness of breath upon exertion: x   Short of breath when lying flat:    Irregular heart rhythm:        Vascular    Pain in calf, thigh, or hip brought on by ambulation:    Pain in feet at night that wakes you up from your sleep:     Blood clot in  your veins:    Leg swelling:  x         PHYSICAL EXAM: Vitals:   04/06/17 1405  BP: (!) 122/51  Pulse: 68  Resp: 18  Temp: (!) 96.7 F (35.9 C)  Weight: 204 lb (92.5 kg)  Height: 5\' 7"  (1.702 m)    GENERAL: The patient is a well-nourished male, in no acute distress. The vital signs are documented above. CARDIOVASCULAR: Palpable radial pulses bilaterally. Palpable femoral pulses bilaterally. I do not palpate distal pulses possibly related to his severe swelling. No evidence of venous varicosities. Does have thickening in both lower extremity is with pitting edema right greater than left PULMONARY: There is good air exchange  MUSCULOSKELETAL: There are no major deformities or cyanosis. NEUROLOGIC: No focal weakness or paresthesias are detected. SKIN: There are no ulcers or rashes noted. PSYCHIATRIC: The patient has a normal affect.  DATA:  Noninvasive studies from today reveal reflux throughout his deep system on the left. Have reflux in his right common femoral vein and at the level of the saphenofemoral junction. Great saphenous vein on the right is absent prior ablation  MEDICAL ISSUES: Patient has no correctable cause for his venous hypertension related to the deep system and swelling. He is to continue his diuretic treatment and elevation is much as possible. Again explained the critical importance of compression garments which he is back off on. He will begin wearing these again as well. He did have a CT scan yesterday for evaluation of hematuria and lower extremity swelling. I reviewed the films with him. 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 Would recommend ultrasound of this in 2 years for follow-up. At his age and very small size of this I doubt that he would ever require elective repair. He will see [place] again on as-needed basis    Rosetta Posner, MD [facility] Specialists [facility] Tel [phone] Pager [phone]

## 2017-04-08 DIAGNOSIS — J449 Chronic obstructive pulmonary disease, unspecified: Secondary | ICD-10-CM | POA: Diagnosis not present

## 2017-04-14 DIAGNOSIS — R310 Gross hematuria: Secondary | ICD-10-CM | POA: Diagnosis not present

## 2017-04-14 DIAGNOSIS — N451 Epididymitis: Secondary | ICD-10-CM | POA: Diagnosis not present

## 2017-04-15 DIAGNOSIS — N281 Cyst of kidney, acquired: Secondary | ICD-10-CM | POA: Diagnosis not present

## 2017-04-15 DIAGNOSIS — N200 Calculus of kidney: Secondary | ICD-10-CM | POA: Diagnosis not present

## 2017-04-28 DIAGNOSIS — H6122 Impacted cerumen, left ear: Secondary | ICD-10-CM | POA: Diagnosis not present

## 2017-04-28 DIAGNOSIS — H6692 Otitis media, unspecified, left ear: Secondary | ICD-10-CM | POA: Diagnosis not present

## 2017-04-30 DIAGNOSIS — J449 Chronic obstructive pulmonary disease, unspecified: Secondary | ICD-10-CM | POA: Diagnosis not present

## 2017-05-06 DIAGNOSIS — J439 Emphysema, unspecified: Secondary | ICD-10-CM | POA: Diagnosis not present

## 2017-05-18 DIAGNOSIS — R310 Gross hematuria: Secondary | ICD-10-CM | POA: Diagnosis not present

## 2017-05-18 DIAGNOSIS — N401 Enlarged prostate with lower urinary tract symptoms: Secondary | ICD-10-CM | POA: Diagnosis not present

## 2017-05-31 DIAGNOSIS — J449 Chronic obstructive pulmonary disease, unspecified: Secondary | ICD-10-CM | POA: Diagnosis not present

## 2017-06-03 DIAGNOSIS — Z23 Encounter for immunization: Secondary | ICD-10-CM | POA: Diagnosis not present

## 2017-06-05 DIAGNOSIS — J439 Emphysema, unspecified: Secondary | ICD-10-CM | POA: Diagnosis not present

## 2017-06-09 DIAGNOSIS — R6889 Other general symptoms and signs: Secondary | ICD-10-CM | POA: Diagnosis not present

## 2017-07-06 DIAGNOSIS — J439 Emphysema, unspecified: Secondary | ICD-10-CM | POA: Diagnosis not present

## 2017-07-08 DIAGNOSIS — D638 Anemia in other chronic diseases classified elsewhere: Secondary | ICD-10-CM | POA: Diagnosis not present

## 2017-07-08 DIAGNOSIS — I5022 Chronic systolic (congestive) heart failure: Secondary | ICD-10-CM | POA: Diagnosis not present

## 2017-07-08 DIAGNOSIS — E119 Type 2 diabetes mellitus without complications: Secondary | ICD-10-CM | POA: Diagnosis not present

## 2017-07-08 DIAGNOSIS — Z139 Encounter for screening, unspecified: Secondary | ICD-10-CM | POA: Diagnosis not present

## 2017-07-08 DIAGNOSIS — J449 Chronic obstructive pulmonary disease, unspecified: Secondary | ICD-10-CM | POA: Diagnosis not present

## 2017-07-08 DIAGNOSIS — Z1331 Encounter for screening for depression: Secondary | ICD-10-CM | POA: Diagnosis not present

## 2017-07-08 DIAGNOSIS — E785 Hyperlipidemia, unspecified: Secondary | ICD-10-CM | POA: Diagnosis not present

## 2017-08-05 DIAGNOSIS — J439 Emphysema, unspecified: Secondary | ICD-10-CM | POA: Diagnosis not present

## 2017-08-11 DIAGNOSIS — J449 Chronic obstructive pulmonary disease, unspecified: Secondary | ICD-10-CM | POA: Diagnosis not present

## 2017-08-17 DIAGNOSIS — J440 Chronic obstructive pulmonary disease with acute lower respiratory infection: Secondary | ICD-10-CM | POA: Diagnosis not present

## 2017-09-05 DIAGNOSIS — J439 Emphysema, unspecified: Secondary | ICD-10-CM | POA: Diagnosis not present

## 2017-09-07 ENCOUNTER — Ambulatory Visit: Payer: Medicare Other | Admitting: Emergency Medicine

## 2017-09-08 DIAGNOSIS — J449 Chronic obstructive pulmonary disease, unspecified: Secondary | ICD-10-CM | POA: Diagnosis not present

## 2017-09-28 ENCOUNTER — Encounter: Payer: Self-pay | Admitting: Emergency Medicine

## 2017-09-28 ENCOUNTER — Ambulatory Visit: Payer: Medicare Other | Admitting: Emergency Medicine

## 2017-09-28 VITALS — BP 136/60 | HR 67 | Ht 69.0 in | Wt 197.0 lb

## 2017-09-28 DIAGNOSIS — J449 Chronic obstructive pulmonary disease, unspecified: Secondary | ICD-10-CM

## 2017-09-28 NOTE — Progress Notes (Signed)
Subjective:    Patient ID: Harry Norris, male    DOB: [birth year], 82 y.o.   MRN: [id number]  HPI  02/26/16 -- 82 year old gentleman who is here to establish care. Has been seen in the past by Dr. Joya Gaskins and also by Dr Vaughan Browner. He has a history of severe COPD with chronic hypoxemic resp failure requiring nocturnal oxygen 2L/min. FEV1 measured in May 2015 was 1.39 L (44%.). He has been able to be active, still able to golf several times a week. He does have DOE with heavier exertion like chain saw or heavy yard work. He has some cough in the am or late at night, prod yellowish phlegm. Good sleep, in a recliner - he hears wheezing if he lays flat. Cold air is difficult. No exacerbations since last time. He has had them before with URI's, none recently. Manages on perforamist bid, pulmicort bid. He uses afrin tid. Albuterol 1-2x a month zlis fROV 08/18/16 -- This follow-up visit for patient with a history of severe COPD associated with chronic hypoxemia. He has been managed on Perforomist nebulizer BID, DuoNeb when necessary which he uses approximately. Pulmicort flexihaler. He reports a lot of nasal congestion, associated with some cough. He was treated also with abx by his PCP w pred + abx last week for head congestion and cough. He is using Afrin every day - I have counseled him against this in the past.   ROV 02/25/17 -- Patient has a history of severe COPD with associated chronic hypoxemia. He is currently managed on bevespi bid, Dulera bid that he says was started years ago??  Also still on the perforamist he tells me today. He has chronic nasal obstruction and associated cough. Frequent Afrin use which I have tried to discourage. Last visit we started loratadine but he did not take it. He is still using Afrin bid. Quite confused about his meds - but clearly appears   ROV 09/28/17 -- hx severe COPD with associated hypoxemia. Also w chronic rhinitis. Last seen June 2018. Reports today that he has been  doing well. No flares since last time. He has had to curtail his golf due to hip and knee pain. He is using oxygen at night 2L/min. He is on bevespi and perforomist both bid. Uses albuterol about 2-3x a week. No wheeze. Coughs daily, clears green mucous in the am, a few times during the day. He has decreased his afrin use significantly.    DATA: PFTs 01/18/14 FVC 1.75 (40%) FEV1 1.39 (44%), post bronchodilator FEV1 1.58 [50%], +13% F/F 80 Severe obstructive airway disease.  Chest x-ray 12/21/13 Emphysematous changes, bronchitic changes consistent with COPD     Objective:   Physical Exam Vitals:   09/28/17 0934  BP: 136/60  Pulse: 67  SpO2: 95%  Weight: 197 lb (89.4 kg)  Height: 5\' 9"  (1.753 m)  Gen: Pleasant, obese man, in no distress,  normal affect  ENT: No lesions,  mouth clear,  oropharynx clear, no postnasal drip  Neck: No JVD, no TMG, no carotid bruits  Lungs: No use of accessory muscles, distant, no wheeze  Cardiovascular: RRR, heart sounds normal, no murmur or gallops, 2+ LE edema  Musculoskeletal: No deformities, no cyanosis or clubbing  Neuro: alert, non focal  Skin: Warm, no lesions or rashes       Assessment & Plan:  COPD with emphysema Gold C  Doing well. No exacerbations.   Please continue perforomist and bevespi as you are using them Take albuterol  2 puffs up to every 4 hours if needed for shortness of breath.  It is OK to use Afrin occasionally, just not every day If you need an alternative nasal spray we can consider this in the future.  Follow with Dr Lamonte Sakai in 6 months or sooner if you have any problems  Baltazar Apo, MD, PhD 09/28/2017, 1:55 PM [facility] [phone] or if no answer [phone]

## 2017-09-28 NOTE — Assessment & Plan Note (Signed)
Doing well. No exacerbations.   Please continue perforomist and bevespi as you are using them Take albuterol 2 puffs up to every 4 hours if needed for shortness of breath.  It is OK to use Afrin occasionally, just not every day If you need an alternative nasal spray we can consider this in the future.  Follow with Dr Lamonte Sakai in 6 months or sooner if you have any problems

## 2017-09-28 NOTE — Patient Instructions (Signed)
Please continue perforomist and bevespi as you are using them Take albuterol 2 puffs up to every 4 hours if needed for shortness of breath.  It is OK to use Afrin occasionally, just not every day If you need an alternative nasal spray we can consider this in the future.  Follow with Dr Lamonte Sakai in 6 months or sooner if you have any problems

## 2017-10-06 DIAGNOSIS — J439 Emphysema, unspecified: Secondary | ICD-10-CM | POA: Diagnosis not present

## 2017-10-14 DIAGNOSIS — I5022 Chronic systolic (congestive) heart failure: Secondary | ICD-10-CM | POA: Diagnosis not present

## 2017-10-14 DIAGNOSIS — D638 Anemia in other chronic diseases classified elsewhere: Secondary | ICD-10-CM | POA: Diagnosis not present

## 2017-10-14 DIAGNOSIS — E119 Type 2 diabetes mellitus without complications: Secondary | ICD-10-CM | POA: Diagnosis not present

## 2017-10-14 DIAGNOSIS — J449 Chronic obstructive pulmonary disease, unspecified: Secondary | ICD-10-CM | POA: Diagnosis not present

## 2017-10-14 DIAGNOSIS — E785 Hyperlipidemia, unspecified: Secondary | ICD-10-CM | POA: Diagnosis not present

## 2017-10-14 DIAGNOSIS — Z139 Encounter for screening, unspecified: Secondary | ICD-10-CM | POA: Diagnosis not present

## 2017-10-27 DIAGNOSIS — J440 Chronic obstructive pulmonary disease with acute lower respiratory infection: Secondary | ICD-10-CM | POA: Diagnosis not present

## 2017-10-27 DIAGNOSIS — J019 Acute sinusitis, unspecified: Secondary | ICD-10-CM | POA: Diagnosis not present

## 2017-10-27 DIAGNOSIS — R197 Diarrhea, unspecified: Secondary | ICD-10-CM | POA: Diagnosis not present

## 2017-11-03 DIAGNOSIS — J439 Emphysema, unspecified: Secondary | ICD-10-CM | POA: Diagnosis not present

## 2017-11-05 DIAGNOSIS — J019 Acute sinusitis, unspecified: Secondary | ICD-10-CM | POA: Diagnosis not present

## 2017-11-05 DIAGNOSIS — J440 Chronic obstructive pulmonary disease with acute lower respiratory infection: Secondary | ICD-10-CM | POA: Diagnosis not present

## 2017-11-15 DIAGNOSIS — C61 Malignant neoplasm of prostate: Secondary | ICD-10-CM | POA: Diagnosis not present

## 2017-11-16 DIAGNOSIS — J449 Chronic obstructive pulmonary disease, unspecified: Secondary | ICD-10-CM | POA: Diagnosis not present

## 2017-12-04 DIAGNOSIS — J439 Emphysema, unspecified: Secondary | ICD-10-CM | POA: Diagnosis not present

## 2017-12-14 DIAGNOSIS — J449 Chronic obstructive pulmonary disease, unspecified: Secondary | ICD-10-CM | POA: Diagnosis not present

## 2018-01-03 DIAGNOSIS — J439 Emphysema, unspecified: Secondary | ICD-10-CM | POA: Diagnosis not present

## 2018-01-12 DIAGNOSIS — J449 Chronic obstructive pulmonary disease, unspecified: Secondary | ICD-10-CM | POA: Diagnosis not present

## 2018-01-17 DIAGNOSIS — I5022 Chronic systolic (congestive) heart failure: Secondary | ICD-10-CM | POA: Diagnosis not present

## 2018-01-17 DIAGNOSIS — E785 Hyperlipidemia, unspecified: Secondary | ICD-10-CM | POA: Diagnosis not present

## 2018-01-17 DIAGNOSIS — E119 Type 2 diabetes mellitus without complications: Secondary | ICD-10-CM | POA: Diagnosis not present

## 2018-01-17 DIAGNOSIS — D638 Anemia in other chronic diseases classified elsewhere: Secondary | ICD-10-CM | POA: Diagnosis not present

## 2018-01-17 DIAGNOSIS — J449 Chronic obstructive pulmonary disease, unspecified: Secondary | ICD-10-CM | POA: Diagnosis not present

## 2018-01-20 DIAGNOSIS — H26491 Other secondary cataract, right eye: Secondary | ICD-10-CM | POA: Diagnosis not present

## 2018-01-20 DIAGNOSIS — Z9181 History of falling: Secondary | ICD-10-CM | POA: Diagnosis not present

## 2018-01-20 DIAGNOSIS — Z125 Encounter for screening for malignant neoplasm of prostate: Secondary | ICD-10-CM | POA: Diagnosis not present

## 2018-01-20 DIAGNOSIS — H353111 Nonexudative age-related macular degeneration, right eye, early dry stage: Secondary | ICD-10-CM | POA: Diagnosis not present

## 2018-01-20 DIAGNOSIS — Z136 Encounter for screening for cardiovascular disorders: Secondary | ICD-10-CM | POA: Diagnosis not present

## 2018-01-20 DIAGNOSIS — E785 Hyperlipidemia, unspecified: Secondary | ICD-10-CM | POA: Diagnosis not present

## 2018-01-20 DIAGNOSIS — H35033 Hypertensive retinopathy, bilateral: Secondary | ICD-10-CM | POA: Diagnosis not present

## 2018-01-20 DIAGNOSIS — H52223 Regular astigmatism, bilateral: Secondary | ICD-10-CM | POA: Diagnosis not present

## 2018-01-20 DIAGNOSIS — Z0000 Encounter for general adult medical examination without abnormal findings: Secondary | ICD-10-CM | POA: Diagnosis not present

## 2018-01-20 DIAGNOSIS — Z1331 Encounter for screening for depression: Secondary | ICD-10-CM | POA: Diagnosis not present

## 2018-02-03 DIAGNOSIS — J439 Emphysema, unspecified: Secondary | ICD-10-CM | POA: Diagnosis not present

## 2018-02-07 DIAGNOSIS — J45909 Unspecified asthma, uncomplicated: Secondary | ICD-10-CM

## 2018-02-07 DIAGNOSIS — E785 Hyperlipidemia, unspecified: Secondary | ICD-10-CM | POA: Insufficient documentation

## 2018-02-07 HISTORY — DX: Unspecified asthma, uncomplicated: J45.909

## 2018-02-28 NOTE — Progress Notes (Signed)
Cardiology Office Note:    Date:  03/01/2018   ID:  Harry Norris, DOB Feb 12, 1936, MRN [id number]  PCP:  [name], MD  Cardiologist:  Shirlee More, MD    Referring MD: [name], MD    ASSESSMENT:    1. LBBB (left bundle branch block)   2. Dilated cardiomyopathy ([place])   3. Hypertensive heart disease without heart failure   4. Syncope, unspecified syncope type   5. Centrilobular emphysema ([place])   6. Hyperlipidemia, unspecified hyperlipidemia type    PLAN:    In order of problems listed above:  1. I am concerned with edema and orthopnea is developing heart failure echocardiogram ordered to look for worsened LV function.  At this time I will hold on increased diuretics with his previous orthostatic hypotension and syncope. 2. Echocardiogram ordered 3. Stable blood pressure continue current treatment including beta-blocker diuretic ARB 4. Stable no recurrence was seen by EP decision was made not to pursue an ICD or EP studies and that the cause was symptomatic orthostatic hypotension. 5. Managed by his PCP complains of nocturnal supine wheezing that may represent heart failure echocardiogram ordered 6. Stable continue high intensity statin with PAD recent labs liver function lipid profile requested from his PCP   Next appointment: 6 months or sooner if worsened LV function on echo   Medication Adjustments/Labs and Tests Ordered: Current medicines are reviewed at length with the patient today.  Concerns regarding medicines are outlined above.  Orders Placed This Encounter  Procedures  . EKG 12-Lead  . ECHOCARDIOGRAM COMPLETE   No orders of the defined types were placed in this encounter.   Chief Complaint  Patient presents with  . Follow-up    cardiomyopathy with LBBB  . Hypertension  . Hyperlipidemia    History of Present Illness:    Harry Norris is a 82 y.o. male with a hx of LBBB, hypertension, hyperlipidemia, syncope, PAD, COPD, chronic  venous insufficieny and 3.7 cm AAA and cardiomyopathy EF 35-40%  last seen 01/13/18 at [facility] cardiology.. Compliance with diet, lifestyle and medications: Yes  Unfortunately he is less active no exertional shortness of breath chest pain palpitation or syncope but is increased edema is chronic venous insufficiency he finds himself uncomfortable wheezing when he supine and sleeps better with the head of the bed elevated.  With his reduced ejection fraction is at risk for heart failure Past Medical History:  Diagnosis Date  . AAA (abdominal aortic aneurysm) ([place])   . Anemia   . Asthma 02/07/2018  . Benign neoplasm of colon   . Bronchitis   . Cancer [facility])    prostate  . Cardiomyopathy ([place]) 12/18/2015  . Carotid artery occlusion   . Chronic rhinitis 02/26/2016  . Chronic venous insufficiency 12/18/2015  . 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   PFTs 01/18/14:  FeV1 1.58 45% FVC 2.0 50% FeV1/FVC 79% Fef 25-75 795 ( 40% improved after BD) CXR: Copd changes ONO 03/07/14 desats to <88% on RA   . Diverticulosis of colon (without mention of hemorrhage)   . DM type 2 (diabetes mellitus, type 2) ([place])   . Hyperlipidemia   . Hypertension   . Hypoxemia 04/10/2014   Overview:  Last Assessment & Plan:  Cont nocturnal oxygen therapy 2L   . Kidney stone   . LBBB (left bundle branch block) 12/18/2015  . Leg swelling 11/28/2013  . Other primary cardiomyopathies   . Other symptoms involving cardiovascular system 11/28/2013  .  Prostate cancer [facility])    prostate   . Syncope 12/18/2015  . Unspecified venous (peripheral) insufficiency   . Varicose veins   . Varicose veins of lower extremities with other complications 04/28/5620    Past Surgical History:  Procedure Laterality Date  .  cardiac catheterization  may 2011  . CATARACT EXTRACTION Bilateral   . ENDOVENOUS ABLATION SAPHENOUS VEIN W/ LASER Right 03-29-2014   EVLA right greater saphenous vein    . gold seed implants  2011   treatment of prostate cancer    . HERNIA REPAIR Bilateral   . LITHOTRIPSY  2010  . right ear surgery      Current Medications: Current Meds  Medication Sig  . aspirin 325 MG EC tablet Take 325 mg by mouth daily.  Marland Kitchen atorvastatin (LIPITOR) 40 MG tablet Take 40 mg by mouth daily.  . carvedilol (COREG) 25 MG tablet Take 12.5 mg by mouth 2 (two) times daily with a meal.   . cyclobenzaprine (FLEXERIL) 10 MG tablet Take 10 mg by mouth 3 (three) times daily as needed for muscle spasms.  . fenofibrate 160 MG tablet Take 160 mg by mouth daily.  . formoterol (PERFOROMIST) 20 MCG/2ML nebulizer solution Take 2 mLs (20 mcg total) by nebulization 2 (two) times daily.  . furosemide (LASIX) 40 MG tablet Take 40 mg by mouth daily. As needed  . Glycopyrrolate-Formoterol (BEVESPI AEROSPHERE) 9-4.8 MCG/ACT AERO Inhale 2 puffs into the lungs daily.  Marland Kitchen ipratropium-albuterol (DUONEB) 0.5-2.5 (3) MG/3ML SOLN Take 3 mLs by nebulization every 6 (six) hours as needed.  Marland Kitchen losartan (COZAAR) 100 MG tablet Take 100 mg by mouth daily.  . meclizine (ANTIVERT) 25 MG tablet Take 25 mg by mouth as needed.   . Methylcellulose, Laxative, (CITRUCEL) 500 MG TABS Take 500 mg by mouth 2 (two) times daily.  . Multiple Vitamin (MULTIVITAMIN) capsule Take 1 capsule by mouth daily.  . Omega-3 Fatty Acids (FISH OIL) 1000 MG CAPS Take 1,000 mg by mouth 2 (two) times daily.  . promethazine (PHENERGAN) 25 MG tablet Take 25 mg by mouth every 6 (six) hours as needed for nausea or vomiting.  . pseudoephedrine-acetaminophen (TYLENOL SINUS) 30-500 MG TABS tablet Take 1 tablet by mouth 2 (two) times daily.  . vitamin C (ASCORBIC ACID) 500 MG tablet Take 500 mg by mouth daily.  . vitamin E 400 UNIT capsule Take 400 Units by mouth daily.     Allergies:   Ace inhibitors   Social History   Socioeconomic History  . Marital status: Married    Spouse name: Not on file  . Number of children: Not on file  . Years of education: Not on file  . Highest education level: Not on  file  Occupational History  . Not on file  Social Needs  . Financial resource strain: Not on file  . Food insecurity:    Worry: Not on file    Inability: Not on file  . Transportation needs:    Medical: Not on file    Non-medical: Not on file  Tobacco Use  . Smoking status: Former Smoker    Packs/day: 2.50    Years: 40.00    Pack years: 100.00    Types: Cigarettes    Last attempt to quit: 09/01/1999    Years since quitting: 18.5  . Smokeless tobacco: Never Used  Substance and Sexual Activity  . Alcohol use: No  . Drug use: No  . Sexual activity: Not on file  Lifestyle  .  Physical activity:    Days per week: Not on file    Minutes per session: Not on file  . Stress: Not on file  Relationships  . Social connections:    Talks on phone: Not on file    Gets together: Not on file    Attends religious service: Not on file    Active member of club or organization: Not on file    Attends meetings of clubs or organizations: Not on file    Relationship status: Not on file  Other Topics Concern  . Not on file  Social History Narrative   Lives with wife.   Retired.   Telephone business prior.     Family History: The patient's family history includes Asthma in his sister; Cancer - Colon in his mother; Diabetes in his mother; Emphysema in his sister; Heart disease in his sister; Heart failure in his father; Hypertension in his mother; Kidney failure in his father. ROS:   Please see the history of present illness.    All other systems reviewed and are negative.  EKGs/Labs/Other Studies Reviewed:    The following studies were reviewed today:   Recent Labs: requested from his PCP No results found for requested labs within last 8760 hours.  Recent Lipid Panel No results found for: CHOL, TRIG, HDL, CHOLHDL, VLDL, LDLCALC, LDLDIRECT  Physical Exam:    VS:  BP (!) 142/70   Pulse 63   Ht 5\' 9"  (1.753 m)   Wt 195 lb (88.5 kg)   SpO2 96%   BMI 28.80 kg/m     Wt Readings  from Last 3 Encounters:  03/01/18 195 lb (88.5 kg)  09/28/17 197 lb (89.4 kg)  04/06/17 204 lb (92.5 kg)     GEN:  Well nourished, well developed in no acute distress HEENT: Normal NECK: No JVD; No carotid bruits LYMPHATICS: No lymphadenopathy CARDIAC: paradoxical S2 RRR, no murmurs, rubs, gallops RESPIRATORY:  Clear to auscultation without rales, wheezing or rhonchi  ABDOMEN: Soft, non-tender, non-distended MUSCULOSKELETAL:  2+ to the knee edema; No deformity  SKIN: Warm and dry NEUROLOGIC:  Alert and oriented x 3 PSYCHIATRIC:  Normal affect    Signed, Shirlee More, MD  03/01/2018 1:00 PM     [facility]

## 2018-03-01 ENCOUNTER — Encounter: Payer: Self-pay | Admitting: Cardiology

## 2018-03-01 ENCOUNTER — Ambulatory Visit: Payer: Medicare Other | Admitting: Cardiology

## 2018-03-01 VITALS — BP 142/70 | HR 63 | Ht 69.0 in | Wt 195.0 lb

## 2018-03-01 DIAGNOSIS — R55 Syncope and collapse: Secondary | ICD-10-CM

## 2018-03-01 DIAGNOSIS — J432 Centrilobular emphysema: Secondary | ICD-10-CM

## 2018-03-01 DIAGNOSIS — I447 Left bundle-branch block, unspecified: Secondary | ICD-10-CM

## 2018-03-01 DIAGNOSIS — H26491 Other secondary cataract, right eye: Secondary | ICD-10-CM | POA: Diagnosis not present

## 2018-03-01 DIAGNOSIS — I119 Hypertensive heart disease without heart failure: Secondary | ICD-10-CM | POA: Diagnosis not present

## 2018-03-01 DIAGNOSIS — E785 Hyperlipidemia, unspecified: Secondary | ICD-10-CM

## 2018-03-01 DIAGNOSIS — I420 Dilated cardiomyopathy: Secondary | ICD-10-CM

## 2018-03-01 NOTE — Patient Instructions (Signed)
Medication Instructions:  Your physician recommends that you continue on your current medications as directed. Please refer to the Current Medication list given to you today.   Labwork: NONE  Testing/Procedures: You had an EKG today  Your physician has requested that you have an echocardiogram. Echocardiography is a painless test that uses sound waves to create images of your heart. It provides your doctor with information about the size and shape of your heart and how well your heart's chambers and valves are working. This procedure takes approximately one hour. There are no restrictions for this procedure.    Follow-Up: Your physician wants you to follow-up in: 6 months.  You will receive a reminder letter in the mail two months in advance. If you don't receive a letter, please call our office to schedule the follow-up appointment.   Any Other Special Instructions Will Be Listed Below (If Applicable).     If you need a refill on your cardiac medications before your next appointment, please call your pharmacy.

## 2018-03-02 ENCOUNTER — Ambulatory Visit (INDEPENDENT_AMBULATORY_CARE_PROVIDER_SITE_OTHER): Payer: Medicare Other

## 2018-03-02 ENCOUNTER — Other Ambulatory Visit: Payer: Self-pay

## 2018-03-02 DIAGNOSIS — I420 Dilated cardiomyopathy: Secondary | ICD-10-CM | POA: Diagnosis not present

## 2018-03-02 NOTE — Progress Notes (Signed)
Echocardiogram performed  Jimmy Iridessa Harrow RDCS 

## 2018-03-05 DIAGNOSIS — J439 Emphysema, unspecified: Secondary | ICD-10-CM | POA: Diagnosis not present

## 2018-03-10 DIAGNOSIS — J449 Chronic obstructive pulmonary disease, unspecified: Secondary | ICD-10-CM | POA: Diagnosis not present

## 2018-04-05 ENCOUNTER — Ambulatory Visit: Payer: Medicare Other | Admitting: Emergency Medicine

## 2018-04-05 ENCOUNTER — Encounter: Payer: Self-pay | Admitting: Emergency Medicine

## 2018-04-05 DIAGNOSIS — J439 Emphysema, unspecified: Secondary | ICD-10-CM | POA: Diagnosis not present

## 2018-04-05 DIAGNOSIS — J432 Centrilobular emphysema: Secondary | ICD-10-CM | POA: Diagnosis not present

## 2018-04-05 DIAGNOSIS — R0902 Hypoxemia: Secondary | ICD-10-CM

## 2018-04-05 NOTE — Assessment & Plan Note (Signed)
Documented nocturnal hypoxemia.  Does not desaturate with exertion based on his last ambulatory oximetry.  We can consider rechecking this at some point in the future if his symptoms evolve.  Continue your oxygen at night as you have been using it.

## 2018-04-05 NOTE — Assessment & Plan Note (Signed)
Stable at this time.  He has not had any flares.  Adequate symptom control with his maintenance medications, very rare albuterol use.  Please continue your Perforomist and Bevespi as you have been taking them. Keep albuterol available to use 2 puffs if needed for shortness of breath, chest tightness, wheezing. Get your flu shot in the fall. Your pneumonia shots are up-to-date Follow with Dr Lamonte Sakai in 6 months or sooner if you have any problems

## 2018-04-05 NOTE — Patient Instructions (Addendum)
Please continue your Perforomist and Bevespi as you have been taking them. Keep albuterol available to use 2 puffs if needed for shortness of breath, chest tightness, wheezing. Get your flu shot in the fall. Your pneumonia shots are up-to-date Continue your oxygen at night as you have been using it.  Follow with Dr Lamonte Sakai in 6 months or sooner if you have any problems

## 2018-04-05 NOTE — Progress Notes (Signed)
   Subjective:    Patient ID: Harry Norris, male    DOB: [birth year], 82 y.o.   MRN: [id number]  HPI   ROV 02/25/17 -- Patient has a history of severe COPD with associated chronic hypoxemia. He is currently managed on bevespi bid, Dulera bid that he says was started years ago??  Also still on the perforamist he tells me today. He has chronic nasal obstruction and associated cough. Frequent Afrin use which I have tried to discourage. Last visit we started loratadine but he did not take it. He is still using Afrin bid. Quite confused about his meds - but clearly appears   ROV 09/28/17 -- hx severe COPD with associated hypoxemia. Also w chronic rhinitis. Last seen June 2018. Reports today that he has been doing well. No flares since last time. He has had to curtail his golf due to hip and knee pain. He is using oxygen at night 2L/min. He is on bevespi and perforomist both bid. Uses albuterol about 2-3x a week. No wheeze. Coughs daily, clears green mucous in the am, a few times during the day. He has decreased his afrin use significantly.   ROV 04/05/18 --82 year old man with severe COPD with associated chronic hypoxemic respiratory failure, chronic nasal obstruction with associated drainage, cough.  He uses Afrin less frequently - about 2x a week.  He is currently managed on Perforomist, Bevespi.  He uses albuterol approximately occasionally - about 1x a month. He has not had any flares for several years.  he is able to do yard work. Continue your oxygen at night as you have been using it.    DATA: PFTs 01/18/14 FVC 1.75 (40%) FEV1 1.39 (44%), post bronchodilator FEV1 1.58 [50%], +13% F/F 80 Severe obstructive airway disease.  Chest x-ray 12/21/13 Emphysematous changes, bronchitic changes consistent with COPD     Objective:   Physical Exam Vitals:   04/05/18 1036  BP: (!) 128/58  Pulse: 63  SpO2: 92%  Weight: 194 lb 6.4 oz (88.2 kg)  Height: 5\' 9"  (1.753 m)  Gen: Pleasant, obese man, in no  distress,  normal affect  ENT: No lesions,  mouth clear,  oropharynx clear, no postnasal drip  Neck: No JVD, no stridor  Lungs: No use of accessory muscles, distant, no wheeze  Cardiovascular: RRR, heart sounds normal, no murmur or gallops, trace LE edema  Musculoskeletal: No deformities, no cyanosis or clubbing  Neuro: alert, non focal  Skin: Warm, no lesions or rashes       Assessment & Plan:  COPD with emphysema Gold C  Stable at this time.  He has not had any flares.  Adequate symptom control with his maintenance medications, very rare albuterol use.  Please continue your Perforomist and Bevespi as you have been taking them. Keep albuterol available to use 2 puffs if needed for shortness of breath, chest tightness, wheezing. Get your flu shot in the fall. Your pneumonia shots are up-to-date Follow with Dr Lamonte Sakai in 6 months or sooner if you have any problems  Hypoxemia Documented nocturnal hypoxemia.  Does not desaturate with exertion based on his last ambulatory oximetry.  We can consider rechecking this at some point in the future if his symptoms evolve.  Continue your oxygen at night as you have been using it.   Baltazar Apo, MD, PhD 04/05/2018, 10:56 AM [facility] [phone] or if no answer [phone]

## 2018-04-07 DIAGNOSIS — J449 Chronic obstructive pulmonary disease, unspecified: Secondary | ICD-10-CM | POA: Diagnosis not present

## 2018-04-13 DIAGNOSIS — L570 Actinic keratosis: Secondary | ICD-10-CM | POA: Diagnosis not present

## 2018-04-13 DIAGNOSIS — L578 Other skin changes due to chronic exposure to nonionizing radiation: Secondary | ICD-10-CM | POA: Diagnosis not present

## 2018-04-13 DIAGNOSIS — C44619 Basal cell carcinoma of skin of left upper limb, including shoulder: Secondary | ICD-10-CM | POA: Diagnosis not present

## 2018-04-18 DIAGNOSIS — C44619 Basal cell carcinoma of skin of left upper limb, including shoulder: Secondary | ICD-10-CM | POA: Diagnosis not present

## 2018-04-26 DIAGNOSIS — E785 Hyperlipidemia, unspecified: Secondary | ICD-10-CM | POA: Diagnosis not present

## 2018-04-26 DIAGNOSIS — I5022 Chronic systolic (congestive) heart failure: Secondary | ICD-10-CM | POA: Diagnosis not present

## 2018-04-26 DIAGNOSIS — J449 Chronic obstructive pulmonary disease, unspecified: Secondary | ICD-10-CM | POA: Diagnosis not present

## 2018-04-26 DIAGNOSIS — D638 Anemia in other chronic diseases classified elsewhere: Secondary | ICD-10-CM | POA: Diagnosis not present

## 2018-04-26 DIAGNOSIS — E119 Type 2 diabetes mellitus without complications: Secondary | ICD-10-CM | POA: Diagnosis not present

## 2018-05-06 DIAGNOSIS — J439 Emphysema, unspecified: Secondary | ICD-10-CM | POA: Diagnosis not present

## 2018-05-11 DIAGNOSIS — D485 Neoplasm of uncertain behavior of skin: Secondary | ICD-10-CM | POA: Diagnosis not present

## 2018-05-11 DIAGNOSIS — C44619 Basal cell carcinoma of skin of left upper limb, including shoulder: Secondary | ICD-10-CM | POA: Diagnosis not present

## 2018-05-11 DIAGNOSIS — J449 Chronic obstructive pulmonary disease, unspecified: Secondary | ICD-10-CM | POA: Diagnosis not present

## 2018-05-11 DIAGNOSIS — L720 Epidermal cyst: Secondary | ICD-10-CM | POA: Diagnosis not present

## 2018-05-16 DIAGNOSIS — H353111 Nonexudative age-related macular degeneration, right eye, early dry stage: Secondary | ICD-10-CM | POA: Diagnosis not present

## 2018-05-16 DIAGNOSIS — H35033 Hypertensive retinopathy, bilateral: Secondary | ICD-10-CM | POA: Diagnosis not present

## 2018-05-18 DIAGNOSIS — N3289 Other specified disorders of bladder: Secondary | ICD-10-CM | POA: Diagnosis not present

## 2018-06-03 DIAGNOSIS — Z23 Encounter for immunization: Secondary | ICD-10-CM | POA: Diagnosis not present

## 2018-06-05 DIAGNOSIS — J439 Emphysema, unspecified: Secondary | ICD-10-CM | POA: Diagnosis not present

## 2018-06-08 DIAGNOSIS — J440 Chronic obstructive pulmonary disease with acute lower respiratory infection: Secondary | ICD-10-CM | POA: Diagnosis not present

## 2018-06-09 DIAGNOSIS — L821 Other seborrheic keratosis: Secondary | ICD-10-CM | POA: Diagnosis not present

## 2018-06-09 DIAGNOSIS — J449 Chronic obstructive pulmonary disease, unspecified: Secondary | ICD-10-CM | POA: Diagnosis not present

## 2018-06-09 DIAGNOSIS — L570 Actinic keratosis: Secondary | ICD-10-CM | POA: Diagnosis not present

## 2018-06-09 DIAGNOSIS — L578 Other skin changes due to chronic exposure to nonionizing radiation: Secondary | ICD-10-CM | POA: Diagnosis not present

## 2018-06-09 DIAGNOSIS — B356 Tinea cruris: Secondary | ICD-10-CM | POA: Diagnosis not present

## 2018-06-09 DIAGNOSIS — L720 Epidermal cyst: Secondary | ICD-10-CM | POA: Diagnosis not present

## 2018-06-28 ENCOUNTER — Encounter: Payer: Self-pay | Admitting: Emergency Medicine

## 2018-06-28 ENCOUNTER — Ambulatory Visit: Payer: Medicare Other | Admitting: Emergency Medicine

## 2018-06-28 DIAGNOSIS — J310 Chronic rhinitis: Secondary | ICD-10-CM

## 2018-06-28 DIAGNOSIS — J432 Centrilobular emphysema: Secondary | ICD-10-CM | POA: Diagnosis not present

## 2018-06-28 NOTE — Progress Notes (Signed)
Subjective:    Patient ID: Harry Norris, male    DOB: Mar 22, 1936, 82 y.o.   MRN: [id number]  HPI   ROV 02/25/17 -- Patient has a history of severe COPD with associated chronic hypoxemia. He is currently managed on bevespi bid, Dulera bid that he says was started years ago??  Also still on the perforamist he tells me today. He has chronic nasal obstruction and associated cough. Frequent Afrin use which I have tried to discourage. Last visit we started loratadine but he did not take it. He is still using Afrin bid. Quite confused about his meds - but clearly appears   ROV 09/28/17 -- hx severe COPD with associated hypoxemia. Also w chronic rhinitis. Last seen June 2018. Reports today that he has been doing well. No flares since last time. He has had to curtail his golf due to hip and knee pain. He is using oxygen at night 2L/min. He is on bevespi and perforomist both bid. Uses albuterol about 2-3x a week. No wheeze. Coughs daily, clears green mucous in the am, a few times during the day. He has decreased his afrin use significantly.   ROV 04/05/18 --82 year old man with severe COPD with associated chronic hypoxemic respiratory failure, chronic nasal obstruction with associated drainage, cough.  He uses Afrin less frequently - about 2x a week.  He is currently managed on Perforomist, Bevespi.  He uses albuterol approximately occasionally - about 1x a month. He has not had any flares for several years.  he is able to do yard work. Continue your oxygen at night as you have been using it.   ROV 06/28/18 --patient has a history of severe COPD, chronic hypoxemic respiratory failure, nasal obstruction with associated drainage and cough.  We are managing him on Perforomist and Bevespi. He ran out of the bevespi 2 days ago - he had some dulera at home so started it instead. He believes perforamist helps him the most. He uses afrin much less frequently than he used to.    DATA: PFTs 01/18/14 FVC 1.75 (40%) FEV1  1.39 (44%), post bronchodilator FEV1 1.58 [50%], +13% F/F 80 Severe obstructive airway disease.  Chest x-ray 12/21/13 Emphysematous changes, bronchitic changes consistent with COPD     Objective:   Physical Exam Vitals:   06/28/18 1508  BP: 130/60  Pulse: 75  SpO2: 93%  Weight: 191 lb 12.8 oz (87 kg)  Height: 5\' 9"  (1.753 m)  Gen: Pleasant, obese man, in no distress,  normal affect  ENT: No lesions,  mouth clear,  oropharynx clear, no postnasal drip  Neck: No JVD, no stridor  Lungs: No use of accessory muscles, distant, no wheeze  Cardiovascular: RRR, heart sounds normal, no murmur or gallops, trace LE edema  Musculoskeletal: No deformities, no cyanosis or clubbing  Neuro: alert, non focal  Skin: Warm, no lesions or rashes       Assessment & Plan:  COPD with emphysema Gold C  COPD that appears to be relatively stable.  No exacerbations.  He ran out of his Bevespi, replaced it with [facility] because he had some at home.  He is not sure that he needs to be on either of these, feels that Perforomist helps him the most.  I think is okay to try stopping the [facility] for now.  If he declines then I would restart a LAMA only.  Chronic rhinitis He has significantly decreased his Afrin use.  I think is okay for him to continue to use  it rarely.  Baltazar Apo, MD, PhD 06/28/2018, 3:27 PM [facility] [phone] or if no answer [phone]

## 2018-06-28 NOTE — Patient Instructions (Addendum)
Please continue Perforomist twice a day as you have been taking it. We will not restart Bevespi or Dulera at this time. If your breathing worsens off of the Bevespi then call us and we will discuss starting an alternative such as Spiriva or Incruse. Keep albuterol available to use if you needed for shortness of breath, chest tightness, wheezing. Try to use your Afrin nasal spray rarely. Flu shot up-to-date. Pneumonia vaccine up-to-date. Follow with Dr Lamonte Sakai in 6 months or sooner if you have any problems

## 2018-06-28 NOTE — Assessment & Plan Note (Signed)
COPD that appears to be relatively stable.  No exacerbations.  He ran out of his Bevespi, replaced it with [facility] because he had some at home.  He is not sure that he needs to be on either of these, feels that Perforomist helps him the most.  I think is okay to try stopping the [facility] for now.  If he declines then I would restart a LAMA only.

## 2018-06-28 NOTE — Assessment & Plan Note (Signed)
He has significantly decreased his Afrin use.  I think is okay for him to continue to use it rarely.

## 2018-07-06 DIAGNOSIS — J439 Emphysema, unspecified: Secondary | ICD-10-CM | POA: Diagnosis not present

## 2018-07-14 DIAGNOSIS — L97921 Non-pressure chronic ulcer of unspecified part of left lower leg limited to breakdown of skin: Secondary | ICD-10-CM | POA: Diagnosis not present

## 2018-07-18 DIAGNOSIS — J449 Chronic obstructive pulmonary disease, unspecified: Secondary | ICD-10-CM | POA: Diagnosis not present

## 2018-07-19 ENCOUNTER — Other Ambulatory Visit: Payer: Self-pay | Admitting: Cardiology

## 2018-07-19 NOTE — Telephone Encounter (Signed)
°*  STAT* If patient is at the pharmacy, call can be transferred to refill team.   1. Which medications need to be refilled? (please list name of each medication and dose if known) Carvedilol 25mg  takes 1/2 twice daily  2. Which pharmacy/location (including street and city if local pharmacy) is medication to be sent to?[facility]  3. Do they need a 30 day or 90 day supply? [place]

## 2018-07-20 MED ORDER — CARVEDILOL 25 MG PO TABS: 12.5000 mg | ORAL_TABLET | Freq: Two times a day (BID) | ORAL | 0 refills | Status: DC

## 2018-07-20 NOTE — Telephone Encounter (Signed)
Rx sent to pharmacy as requested.

## 2018-07-20 NOTE — Addendum Note (Signed)
Addended by: Stevan Born on: 07/20/2018 04:44 PM   Modules accepted: Orders

## 2018-08-02 DIAGNOSIS — D638 Anemia in other chronic diseases classified elsewhere: Secondary | ICD-10-CM | POA: Diagnosis not present

## 2018-08-02 DIAGNOSIS — E119 Type 2 diabetes mellitus without complications: Secondary | ICD-10-CM | POA: Diagnosis not present

## 2018-08-02 DIAGNOSIS — J449 Chronic obstructive pulmonary disease, unspecified: Secondary | ICD-10-CM | POA: Diagnosis not present

## 2018-08-02 DIAGNOSIS — E785 Hyperlipidemia, unspecified: Secondary | ICD-10-CM | POA: Diagnosis not present

## 2018-08-02 DIAGNOSIS — I5022 Chronic systolic (congestive) heart failure: Secondary | ICD-10-CM | POA: Diagnosis not present

## 2018-08-05 DIAGNOSIS — J439 Emphysema, unspecified: Secondary | ICD-10-CM | POA: Diagnosis not present

## 2018-08-15 DIAGNOSIS — L570 Actinic keratosis: Secondary | ICD-10-CM | POA: Diagnosis not present

## 2018-08-15 DIAGNOSIS — L97911 Non-pressure chronic ulcer of unspecified part of right lower leg limited to breakdown of skin: Secondary | ICD-10-CM | POA: Diagnosis not present

## 2018-08-15 DIAGNOSIS — L97921 Non-pressure chronic ulcer of unspecified part of left lower leg limited to breakdown of skin: Secondary | ICD-10-CM | POA: Diagnosis not present

## 2018-08-16 DIAGNOSIS — J449 Chronic obstructive pulmonary disease, unspecified: Secondary | ICD-10-CM | POA: Diagnosis not present

## 2018-09-05 DIAGNOSIS — J439 Emphysema, unspecified: Secondary | ICD-10-CM | POA: Diagnosis not present

## 2018-09-15 DIAGNOSIS — J440 Chronic obstructive pulmonary disease with acute lower respiratory infection: Secondary | ICD-10-CM | POA: Diagnosis not present

## 2018-09-20 ENCOUNTER — Other Ambulatory Visit: Payer: Self-pay | Admitting: Cardiology

## 2018-09-26 DIAGNOSIS — J449 Chronic obstructive pulmonary disease, unspecified: Secondary | ICD-10-CM | POA: Diagnosis not present

## 2018-10-06 DIAGNOSIS — J439 Emphysema, unspecified: Secondary | ICD-10-CM | POA: Diagnosis not present

## 2018-10-18 ENCOUNTER — Encounter: Payer: Self-pay | Admitting: Emergency Medicine

## 2018-10-18 ENCOUNTER — Ambulatory Visit: Payer: Medicare Other | Admitting: Emergency Medicine

## 2018-10-18 DIAGNOSIS — J310 Chronic rhinitis: Secondary | ICD-10-CM

## 2018-10-18 DIAGNOSIS — J432 Centrilobular emphysema: Secondary | ICD-10-CM | POA: Diagnosis not present

## 2018-10-18 NOTE — Progress Notes (Signed)
   Subjective:    Patient ID: Harry Norris, male    DOB: Dec 31, 1935, 83 y.o.   MRN: [id number]  HPI   ROV 10/18/2018 --follow-up visit for 83 year old gentleman with severe COPD and associated chronic hypoxemic respiratory failure.  He also has history of chronic rhinitis associated with drainage and coughing.  He had been managed on Perforomist and Bevespi, but we stopped the [place] last time (he ran out of it and did not think he missed it). Remains on perforomist. Uses albuterol occasionally - once a month. He has some daily cough, produces green mucous in the am. His mobility is being limited by knee pain, has some swelling in his LE. He was last treated with pred + abx by his PCP about 2 months ago, averages 2x a year. Flu shot up to date.    DATA: PFTs 01/18/14 FVC 1.75 (40%) FEV1 1.39 (44%), post bronchodilator FEV1 1.58 [50%], +13% F/F 80 Severe obstructive airway disease.  Chest x-ray 12/21/13 Emphysematous changes, bronchitic changes consistent with COPD     Objective:   Physical Exam Vitals:   10/18/18 1424  BP: 136/60  Pulse: 62  SpO2: 96%  Weight: 184 lb 12.8 oz (83.8 kg)  Height: 5\' 9"  (1.753 m)  Gen: Pleasant, obese man, in no distress,  normal affect  ENT: No lesions,  mouth clear,  oropharynx clear, no postnasal drip  Neck: No JVD, no stridor  Lungs: No use of accessory muscles, distant, no wheeze  Cardiovascular: RRR, heart sounds normal, no murmur or gallops, 1+ LE edema  Musculoskeletal: No deformities, no cyanosis or clubbing  Neuro: alert, non focal  Skin: Warm, no lesions or rashes       Assessment & Plan:  COPD with emphysema Gold C  Please continue Perforomist twice a day. Keep your albuterol available to use 2 puffs if needed for shortness of breath, chest tightness, wheezing. Depending on your symptoms and how frequently you have flareups we may decide to change your inhaler routine going forward. We will discuss next time. Flu shot  up-to-date Pneumonia vaccine up-to-date Follow with Dr Lamonte Sakai in 6 months or sooner if you have any problems  Chronic rhinitis He has significantly decreased his afrin use. I supported this  Baltazar Apo, MD, PhD 10/18/2018, 2:44 PM [facility] [phone] or if no answer [phone]

## 2018-10-18 NOTE — Assessment & Plan Note (Signed)
He has significantly decreased his afrin use. I supported this

## 2018-10-18 NOTE — Patient Instructions (Addendum)
Please continue Perforomist twice a day. Keep your albuterol available to use 2 puffs if needed for shortness of breath, chest tightness, wheezing. Depending on your symptoms and how frequently you have flareups we may decide to change your inhaler routine going forward. We will discuss next time. Flu shot up-to-date Pneumonia vaccine up-to-date Follow with Dr Lamonte Sakai in 6 months or sooner if you have any problems

## 2018-10-18 NOTE — Assessment & Plan Note (Signed)
Please continue Perforomist twice a day. Keep your albuterol available to use 2 puffs if needed for shortness of breath, chest tightness, wheezing. Depending on your symptoms and how frequently you have flareups we may decide to change your inhaler routine going forward. We will discuss next time. Flu shot up-to-date Pneumonia vaccine up-to-date Follow with Dr Lamonte Sakai in 6 months or sooner if you have any problems

## 2018-10-31 DIAGNOSIS — J449 Chronic obstructive pulmonary disease, unspecified: Secondary | ICD-10-CM | POA: Diagnosis not present

## 2018-11-04 DIAGNOSIS — J439 Emphysema, unspecified: Secondary | ICD-10-CM | POA: Diagnosis not present

## 2018-11-07 DIAGNOSIS — Z139 Encounter for screening, unspecified: Secondary | ICD-10-CM | POA: Diagnosis not present

## 2018-11-07 DIAGNOSIS — E1159 Type 2 diabetes mellitus with other circulatory complications: Secondary | ICD-10-CM | POA: Diagnosis not present

## 2018-11-07 DIAGNOSIS — I5022 Chronic systolic (congestive) heart failure: Secondary | ICD-10-CM | POA: Diagnosis not present

## 2018-11-07 DIAGNOSIS — E785 Hyperlipidemia, unspecified: Secondary | ICD-10-CM | POA: Diagnosis not present

## 2018-11-07 DIAGNOSIS — D638 Anemia in other chronic diseases classified elsewhere: Secondary | ICD-10-CM | POA: Diagnosis not present

## 2018-11-11 DIAGNOSIS — M1711 Unilateral primary osteoarthritis, right knee: Secondary | ICD-10-CM | POA: Diagnosis not present

## 2018-11-16 DIAGNOSIS — N3289 Other specified disorders of bladder: Secondary | ICD-10-CM | POA: Diagnosis not present

## 2018-11-25 ENCOUNTER — Other Ambulatory Visit: Payer: Self-pay | Admitting: Cardiology

## 2018-11-30 DIAGNOSIS — L0390 Cellulitis, unspecified: Secondary | ICD-10-CM | POA: Diagnosis not present

## 2018-11-30 DIAGNOSIS — L89300 Pressure ulcer of unspecified buttock, unstageable: Secondary | ICD-10-CM | POA: Diagnosis not present

## 2018-12-01 DIAGNOSIS — A084 Viral intestinal infection, unspecified: Secondary | ICD-10-CM | POA: Diagnosis not present

## 2018-12-05 DIAGNOSIS — J439 Emphysema, unspecified: Secondary | ICD-10-CM | POA: Diagnosis not present

## 2018-12-07 DIAGNOSIS — R1031 Right lower quadrant pain: Secondary | ICD-10-CM | POA: Diagnosis not present

## 2018-12-07 DIAGNOSIS — K5900 Constipation, unspecified: Secondary | ICD-10-CM | POA: Diagnosis not present

## 2018-12-07 DIAGNOSIS — R001 Bradycardia, unspecified: Secondary | ICD-10-CM | POA: Diagnosis not present

## 2018-12-07 DIAGNOSIS — I493 Ventricular premature depolarization: Secondary | ICD-10-CM | POA: Diagnosis not present

## 2018-12-07 DIAGNOSIS — N200 Calculus of kidney: Secondary | ICD-10-CM | POA: Diagnosis not present

## 2018-12-08 ENCOUNTER — Telehealth: Payer: Self-pay | Admitting: Cardiology

## 2018-12-08 NOTE — Telephone Encounter (Signed)
Cardiac Questionnaire:    Since your last visit or hospitalization:    1. Have you been having new or worsening chest pain? no   2. Have you been having new or worsening shortness of breath? no 3. Have you been having new or worsening leg swelling, wt gain, or increase in abdominal girth (pants fitting more tightly)? no   4. Have you had any passing out spells? no    *A YES to any of these questions would result in the appointment being kept. *If all the answers to these questions are NO, we should indicate that given the current situation regarding the worldwide coronarvirus pandemic, at the recommendation of the CDC, we are looking to limit gatherings in our waiting area, and thus will reschedule their appointment beyond four weeks from today.   _____________   JSEGB-15 Pre-Screening Questions:   Do you currently have a fever? no (yes = cancel and refer to pcp for e-visit)  Have you recently travelled on a cruise, internationally, or to [place], [place], [place], [place], [place], or [place], [place]) ? no (yes = cancel, stay home, monitor symptoms, and contact pcp or initiate e-visit if symptoms develop)  Have you been in contact with someone that is currently pending confirmation of Covid19 testing or has been confirmed to have the [place] virus?  no (yes = cancel, stay home, away from tested individual, monitor symptoms, and contact pcp or initiate e-visit if symptoms develop)  Are you currently experiencing fatigue or cough? no (yes = pt should be prepared to have a mask placed at the time of their visit).          Virtual Visit Pre-Appointment Phone Call  Steps For Call:  1. Confirm consent - "In the setting of the current Covid19 crisis, you are scheduled for a (phone or video) visit with your provider on (date) at (time).  Just as we do with many in-office visits, in order for you to participate in this visit, we must obtain consent.  If you'd like, I can send this to your mychart  (if signed up) or email for you to review.  Otherwise, I can obtain your verbal consent now.  All virtual visits are billed to your insurance company just like a normal visit would be.  By agreeing to a virtual visit, we'd like you to understand that the technology does not allow for your provider to perform an examination, and thus may limit your provider's ability to fully assess your condition.  Finally, though the technology is pretty good, we cannot assure that it will always work on either your or our end, and in the setting of a video visit, we may have to convert it to a phone-only visit.  In either situation, we cannot ensure that we have a secure connection.  Are you willing to proceed?"  2. Give patient instructions for WebEx download to smartphone as below if video visit  3. Advise patient to be prepared with any vital sign or heart rhythm information, their current medicines, and a piece of paper and pen handy for any instructions they may receive the day of their visit  4. Inform patient they will receive a phone call 15 minutes prior to their appointment time (may be from unknown caller ID) so they should be prepared to answer  5. Confirm that appointment type is correct in Epic appointment notes (video vs telephone)    TELEPHONE CALL NOTE  Harry Norris has been deemed a candidate  for a follow-up tele-health visit to limit community exposure during the Covid-19 pandemic. I spoke with the patient via phone to ensure availability of phone/video source, confirm preferred email & phone number, and discuss instructions and expectations.  I reminded Harry Norris to be prepared with any vital sign and/or heart rhythm information that could potentially be obtained via home monitoring, at the time of his visit. I reminded Harry Norris to expect a phone call at the time of his visit if his visit.  Did the patient verbally acknowledge consent to treatment? YES/verbal  Harry Norris 12/08/2018 9:14 AM   DOWNLOADING THE WEBEX SOFTWARE TO SMARTPHONE  - If Apple, go to [employer] and type in WebEx in the search bar. [place] [employer], the blue/green circle. The app is free but as with any other app downloads, their phone may require them to verify saved payment information or Apple password. The patient does NOT have to create an account.  - If Android, ask patient to go to [employer] and type in WebEx in the search bar. [place] [employer], the blue/green circle. The app is free but as with any other app downloads, their phone may require them to verify saved payment information or Android password. The patient does NOT have to create an account.   CONSENT FOR TELE-HEALTH VISIT - PLEASE REVIEW  I hereby voluntarily request, consent and authorize [facility] and its employed or contracted physicians, physician assistants, nurse practitioners or other licensed health care professionals (the Practitioner), to provide me with telemedicine health care services (the Services") as deemed necessary by the treating Practitioner. I acknowledge and consent to receive the Services by the Practitioner via telemedicine. I understand that the telemedicine visit will involve communicating with the Practitioner through live audiovisual communication technology and the disclosure of certain medical information by electronic transmission. I acknowledge that I have been given the opportunity to request an in-person assessment or other available alternative prior to the telemedicine visit and am voluntarily participating in the telemedicine visit.  I understand that I have the right to withhold or withdraw my consent to the use of telemedicine in the course of my care at any time, without affecting my right to future care or treatment, and that the Practitioner or I may terminate the telemedicine visit at any time. I understand that I have the right to inspect  all information obtained and/or recorded in the course of the telemedicine visit and may receive copies of available information for a reasonable fee.  I understand that some of the potential risks of receiving the Services via telemedicine include:   Delay or interruption in medical evaluation due to technological equipment failure or disruption;  Information transmitted may not be sufficient (e.g. poor resolution of images) to allow for appropriate medical decision making by the Practitioner; and/or   In rare instances, security protocols could fail, causing a breach of personal health information.  Furthermore, I acknowledge that it is my responsibility to provide information about my medical history, conditions and care that is complete and accurate to the best of my ability. I acknowledge that Practitioner's advice, recommendations, and/or decision may be based on factors not within their control, such as incomplete or inaccurate data provided by me or distortions of diagnostic images or specimens that may result from electronic transmissions. I understand that the practice of medicine is not an exact science and that Practitioner makes no warranties or guarantees regarding treatment outcomes.  I acknowledge that I will receive a copy of this consent concurrently upon execution via email to the email address I last provided but may also request a printed copy by calling the office of [facility].    I understand that my insurance will be billed for this visit.   I have read or had this consent read to me.  I understand the contents of this consent, which adequately explains the benefits and risks of the Services being provided via telemedicine.   I have been provided ample opportunity to ask questions regarding this consent and the Services and have had my questions answered to my satisfaction.  I give my informed consent for the services to be provided through the use of telemedicine in my  medical care  By participating in this telemedicine visit I agree to the above.

## 2018-12-12 ENCOUNTER — Other Ambulatory Visit: Payer: Self-pay

## 2018-12-12 ENCOUNTER — Telehealth (INDEPENDENT_AMBULATORY_CARE_PROVIDER_SITE_OTHER): Payer: Medicare Other | Admitting: Cardiology

## 2018-12-12 ENCOUNTER — Encounter: Payer: Self-pay | Admitting: Cardiology

## 2018-12-12 VITALS — BP 125/40 | HR 62 | Ht 69.0 in | Wt 182.4 lb

## 2018-12-12 DIAGNOSIS — R55 Syncope and collapse: Secondary | ICD-10-CM

## 2018-12-12 DIAGNOSIS — I420 Dilated cardiomyopathy: Secondary | ICD-10-CM

## 2018-12-12 DIAGNOSIS — I119 Hypertensive heart disease without heart failure: Secondary | ICD-10-CM

## 2018-12-12 DIAGNOSIS — R5383 Other fatigue: Secondary | ICD-10-CM | POA: Diagnosis not present

## 2018-12-12 DIAGNOSIS — J449 Chronic obstructive pulmonary disease, unspecified: Secondary | ICD-10-CM | POA: Diagnosis not present

## 2018-12-12 DIAGNOSIS — E785 Hyperlipidemia, unspecified: Secondary | ICD-10-CM

## 2018-12-12 HISTORY — DX: Other fatigue: R53.83

## 2018-12-12 NOTE — Progress Notes (Signed)
Virtual Visit via Telephone Note   This visit type was conducted due to national recommendations for restrictions regarding the COVID-19 Pandemic (e.g. social distancing) in an effort to limit this patient's exposure and mitigate transmission in our community.  Due to his co-morbid illnesses, this patient is at least at moderate risk for complications without adequate follow up.  This format is felt to be most appropriate for this patient at this time.  The patient did not have access to video technology/had technical difficulties with video requiring transitioning to audio format only (telephone).  All issues noted in this document were discussed and addressed.  No physical exam could be performed with this format.  Please refer to the patient's chart for his  consent to telehealth for [facility].   Evaluation Performed:  Follow-up visit  Date:  12/12/2018   ID:  Harry Norris, DOB 1936/01/19, MRN [id number]  Patient Location: Home  Provider Location: Office  PCP:  [name], MD  Cardiologist:  No primary care provider on file. Dr Bettina Gavia Electrophysiologist:  None   Chief Complaint:  Feeling weak and lightheaded  History of Present Illness:    Harry Norris is a 83 y.o. male who presents via audio/video conferencing for a telehealth visit today.    Harry Norris is a 83 y.o. male with a hx of LBBB, hypertension, hyperlipidemia, syncope, PAD, COPD, chronic venous insufficieny and 3.7 cm AAA and cardiomyopathy EF 35-40%  last seen   The patient does not have symptoms concerning for COVID-19 infection (fever, chills, cough, or new shortness of breath).   Mr. Bess Kinds has not felt well in the last month.  He has overwhelming fatigue lightheadedness and apparently his heart rate was slow when he was seen by his PCP in March and a question of stopping beta-blocker was raised.  He has had symptomatic orthostatic hypotension in the past and had his wife check his blood pressure  while you are on the phone standing 132/68.  He also has a vague nausea has not vomited he has no pain when he eats no change in his bowels and has lost about 10 pounds in the last month.  His heart rate at home runs in the range of 50 bpm.  He has had no chest pain or syncope orthopnea or TIA.  Labs done his PCP office 11/17/2018 shows cholesterol 133 HDL 30 LDL 61 creatinine 1.56 GFR 41 cc/min stage III CKD hemoglobin 11.5.  He had an EKG performed up requested a copy  Past Medical History:  Diagnosis Date   AAA (abdominal aortic aneurysm) ([place])    Anemia    Asthma 02/07/2018   Benign neoplasm of colon    Bronchitis    Cancer ([place])    prostate   Cardiomyopathy ([place]) 12/18/2015   Carotid artery occlusion    Chronic rhinitis 02/26/2016   Chronic venous insufficiency 12/18/2015   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   PFTs 01/18/14:  FeV1 1.58 45% FVC 2.0 50% FeV1/FVC 79% Fef 25-75 795 ( 40% improved after BD) CXR: Copd changes ONO 03/07/14 desats to <88% on RA    Diverticulosis of colon (without mention of hemorrhage)    DM type 2 (diabetes mellitus, type 2) ([place])    Hyperlipidemia    Hypertension    Hypoxemia 04/10/2014   Overview:  Last Assessment & Plan:  Cont nocturnal oxygen therapy 2L    Kidney stone    LBBB (left bundle branch block) 12/18/2015  Leg swelling 11/28/2013   Other primary cardiomyopathies    Other symptoms involving cardiovascular system 11/28/2013   Prostate cancer ([place])    prostate    Syncope 12/18/2015   Unspecified venous (peripheral) insufficiency    Varicose veins    Varicose veins of lower extremities with other complications 2/84/1324   Past Surgical History:  Procedure Laterality Date    cardiac catheterization  may 2011   CATARACT EXTRACTION Bilateral    ENDOVENOUS ABLATION SAPHENOUS VEIN W/ LASER Right 03-29-2014   EVLA right greater saphenous vein     gold seed implants  2011   treatment of prostate cancer   HERNIA REPAIR  Bilateral    LITHOTRIPSY  2010   right ear surgery       Current Meds  Medication Sig   aspirin 325 MG EC tablet Take 325 mg by mouth daily.   atorvastatin (LIPITOR) 40 MG tablet Take 40 mg by mouth daily.   carvedilol (COREG) 25 MG tablet TAKE ONE-HALF TABLET BY  MOUTH 2 TIMES DAILY WITH  MEALS   cyclobenzaprine (FLEXERIL) 10 MG tablet Take 10 mg by mouth 3 (three) times daily as needed for muscle spasms.   fenofibrate 160 MG tablet Take 160 mg by mouth daily.   formoterol (PERFOROMIST) 20 MCG/2ML nebulizer solution Take 2 mLs (20 mcg total) by nebulization 2 (two) times daily.   furosemide (LASIX) 40 MG tablet Take 40 mg by mouth daily.    ipratropium-albuterol (DUONEB) 0.5-2.5 (3) MG/3ML SOLN Take 3 mLs by nebulization every 6 (six) hours as needed.   losartan (COZAAR) 100 MG tablet Take 100 mg by mouth daily.   meclizine (ANTIVERT) 25 MG tablet Take 25 mg by mouth as needed.    Methylcellulose, Laxative, (CITRUCEL) 500 MG TABS Take 500 mg by mouth 2 (two) times daily.   Multiple Vitamin (MULTIVITAMIN) capsule Take 1 capsule by mouth daily.   Omega-3 Fatty Acids (FISH OIL) 1000 MG CAPS Take 1,000 mg by mouth 2 (two) times daily.   promethazine (PHENERGAN) 25 MG tablet Take 25 mg by mouth every 6 (six) hours as needed for nausea or vomiting.   pseudoephedrine-acetaminophen (TYLENOL SINUS) 30-500 MG TABS tablet Take 1 tablet by mouth 2 (two) times daily.   vitamin C (ASCORBIC ACID) 500 MG tablet Take 500 mg by mouth daily.   vitamin E 400 UNIT capsule Take 400 Units by mouth daily.     Allergies:   Ace inhibitors   Social History   Tobacco Use   Smoking status: Former Smoker    Packs/day: 2.50    Years: 40.00    Pack years: 100.00    Types: Cigarettes    Last attempt to quit: 09/01/1999    Years since quitting: 19.2   Smokeless tobacco: Never Used  Substance Use Topics   Alcohol use: No   Drug use: No     Family Hx: The patient's family history  includes Asthma in his sister; Cancer - Colon in his mother; Diabetes in his mother; Emphysema in his sister; Heart disease in his sister; Heart failure in his father; Hypertension in his mother; Kidney failure in his father.  ROS:   Please see the history of present illness.     All other systems reviewed and are negative.   Prior CV studies:   The following studies were reviewed today:  See under history for labs requested EKG  Labs/Other Tests and Data Reviewed:    EKG:  done at his PCP requested  Recent Labs: No results found for requested labs within last 8760 hours.   Recent Lipid Panel No results found for: CHOL, TRIG, HDL, CHOLHDL, LDLCALC, LDLDIRECT  Wt Readings from Last 3 Encounters:  12/12/18 182 lb 6.4 oz (82.7 kg)  10/18/18 184 lb 12.8 oz (83.8 kg)  06/28/18 191 lb 12.8 oz (87 kg)     Objective:    Vital Signs:  BP (!) 125/40    Pulse 62    Ht 5\' 9"  (1.753 m)    Wt 182 lb 6.4 oz (82.7 kg)    BMI 26.94 kg/m    BP standing 132/68 immediately  No audible distress, oriented , clear thought and thought process is normal  ASSESSMENT & PLAN:    1. His predominant problem is malaise and fatigue I think we need to stop his beta-blocker and monitor heart rate and blood pressure twice daily at home and reassess virtual visit in 1 week.  He has had bradycardia in the past and will mail a ZIO monitor for 1 week to his home.  At this time I do not think he requires a pacemaker.  I have cautioned him and his wife that I am not sure that his nausea lack of appetite and weight loss is related to blood pressure heart rate. 2. Cardiomyopathy he will need a follow-up echocardiogram and I think it is an emergency and we can do it after the current COVID-19 outbreak.  Continue his current loop diuretic with edema and shortness of breath in the past 3. Stable discontinue carvedilol follow blood pressure at home at this time I be hesitant to add another agent 4. Stable hyperlipidemia  continue statin lipids are at target normal liver function 5. Stable has had no recurrent syncope in the past was due to orthostatic hypotension was seen by EP and not advised to have an ICD 6. Stable PAD he is very sedentary at this time and is not having claudication continue aspirin and lipid-lowering therapy  COVID-19 Education: The signs and symptoms of COVID-19 were discussed with the patient and how to seek care for testing (follow up with PCP or arrange E-visit).  The importance of social distancing was discussed today.  Time:   Today, I have spent 29 minutes with the patient with telehealth technology discussing the above problems.     Medication Adjustments/Labs and Tests Ordered: Current medicines are reviewed at length with the patient today.  Concerns regarding medicines are outlined above.  Tests Ordered: No orders of the defined types were placed in this encounter.  Medication Changes: No orders of the defined types were placed in this encounter.   Disposition:  Follow up in 1 week(s)  Signed, Shirlee More, MD  12/12/2018 9:28 AM    [facility]

## 2018-12-12 NOTE — Patient Instructions (Addendum)
RN reviewed AVS information below on 12/12/18 at 1000. Patient informed about discontinued medication, how to take home BP/log and a Zio will be mailed. Scheduled for 1 week follow up. No further questions at this time.   Medication Instructions:  STOP taking carvedilol  If you need a refill on your cardiac medications before your next appointment, please call your pharmacy.   Lab work: NONE today If you have labs (blood work) drawn today and your tests are completely normal, you will receive your results only by: Marland Kitchen MyChart Message (if you have MyChart) OR . A paper copy in the mail If you have any lab test that is abnormal or we need to change your treatment, we will call you to review the results.  Testing/Procedures: Your physician has requested that you regularly monitor and record your heart rate and blood pressure readings at home. Please use the same machine at the same time of day to check your readings and record them to bring to your follow-up visit.   Your physician has recommended that you wear a ZIO monitor. ZIO monitors are medical devices that record the heart's electrical activity. Doctors most often use these monitors to diagnose arrhythmias. Arrhythmias are problems with the speed or rhythm of the heartbeat. The monitor is a small, portable device. You can wear one while you do your normal daily activities. This is usually used to diagnose what is causing palpitations/syncope (passing out). You will wear this device for 7 days.    Follow-Up: At [facility], you and your health needs are our priority.  As part of our continuing mission to provide you with exceptional heart care, we have created designated Provider Care Teams.  These Care Teams include your primary Cardiologist (physician) and Advanced Practice Providers (APPs -  Physician Assistants and Nurse Practitioners) who all work together to provide you with the care you need, when you need it. You will need a  follow up appointment in 1 weeks.   Any Other Special Instructions Will Be Listed Below  Blood Pressure Record Sheet To take your blood pressure, you will need a blood pressure machine. You can buy a blood pressure machine (blood pressure monitor) at your clinic, drug store, or online. When choosing one, consider:  An automatic monitor that has an arm cuff.  A cuff that wraps snugly around your upper arm. You should be able to fit only one finger between your arm and the cuff.  A device that stores blood pressure reading results.  Do not choose a monitor that measures your blood pressure from your wrist or finger. Follow your health care provider's instructions for how to take your blood pressure. To use this form:  Get one reading in the morning (a.m.) before you take any medicines.  Get one reading in the evening (p.m.) before supper.  Take at least 2 readings with each blood pressure check. This makes sure the results are correct. Wait 1-2 minutes between measurements.  Write down the results in the spaces on this form.  Repeat this once a week, or as told by your health care provider.  Make a follow-up appointment with your health care provider to discuss the results. Blood pressure log Date: _______________________  a.m. _____________________(1st reading) _____________________(2nd reading)  p.m. _____________________(1st reading) _____________________(2nd reading) Date: _______________________  a.m. _____________________(1st reading) _____________________(2nd reading)  p.m. _____________________(1st reading) _____________________(2nd reading) Date: _______________________  a.m. _____________________(1st reading) _____________________(2nd reading)  p.m. _____________________(1st reading) _____________________(2nd reading) Date: _______________________  a.m. _____________________(1st reading) _____________________(2nd  reading)  p.m. _____________________(1st reading)  _____________________(2nd reading) Date: _______________________  a.m. _____________________(1st reading) _____________________(2nd reading)  p.m. _____________________(1st reading) _____________________(2nd reading) This information is not intended to replace advice given to you by your health care provider. Make sure you discuss any questions you have with your health care provider. Document Released: 05/16/2003 Document Revised: 08/17/2017 Document Reviewed: 08/17/2017 Elsevier Interactive Patient Education  2019 [employer].

## 2018-12-14 ENCOUNTER — Other Ambulatory Visit (INDEPENDENT_AMBULATORY_CARE_PROVIDER_SITE_OTHER): Payer: Medicare Other

## 2018-12-14 DIAGNOSIS — R55 Syncope and collapse: Secondary | ICD-10-CM

## 2018-12-14 DIAGNOSIS — R5383 Other fatigue: Secondary | ICD-10-CM | POA: Diagnosis not present

## 2018-12-15 ENCOUNTER — Telehealth: Payer: Self-pay | Admitting: *Deleted

## 2018-12-15 NOTE — Telephone Encounter (Signed)
Pt called to say he had put Zio on wrong side of chest so took off and tried to put on the correct location and won't stick and is blinking orange. I let him know that once you take the zio off it won't stick very well. The orange light means that monitor is not getting good contact and won't be good report. Instructed pt to call number on the box and see if they can help him reapply it and if not they may ask him to send back and will send him another one. Let him know if [employer] needs to call me just let them know that I'm available to re enroll pt if need to.

## 2018-12-19 ENCOUNTER — Encounter: Payer: Self-pay | Admitting: Cardiology

## 2018-12-19 ENCOUNTER — Other Ambulatory Visit: Payer: Self-pay

## 2018-12-19 ENCOUNTER — Telehealth (INDEPENDENT_AMBULATORY_CARE_PROVIDER_SITE_OTHER): Payer: Medicare Other | Admitting: Cardiology

## 2018-12-19 VITALS — BP 133/40 | HR 57 | Ht 69.0 in | Wt 182.0 lb

## 2018-12-19 DIAGNOSIS — R5383 Other fatigue: Secondary | ICD-10-CM

## 2018-12-19 DIAGNOSIS — I951 Orthostatic hypotension: Secondary | ICD-10-CM

## 2018-12-19 DIAGNOSIS — L821 Other seborrheic keratosis: Secondary | ICD-10-CM | POA: Diagnosis not present

## 2018-12-19 DIAGNOSIS — R001 Bradycardia, unspecified: Secondary | ICD-10-CM

## 2018-12-19 DIAGNOSIS — I420 Dilated cardiomyopathy: Secondary | ICD-10-CM

## 2018-12-19 DIAGNOSIS — L578 Other skin changes due to chronic exposure to nonionizing radiation: Secondary | ICD-10-CM | POA: Diagnosis not present

## 2018-12-19 DIAGNOSIS — L570 Actinic keratosis: Secondary | ICD-10-CM | POA: Diagnosis not present

## 2018-12-19 DIAGNOSIS — L89300 Pressure ulcer of unspecified buttock, unstageable: Secondary | ICD-10-CM | POA: Diagnosis not present

## 2018-12-19 DIAGNOSIS — I119 Hypertensive heart disease without heart failure: Secondary | ICD-10-CM

## 2018-12-19 HISTORY — DX: Orthostatic hypotension: I95.1

## 2018-12-19 HISTORY — DX: Bradycardia, unspecified: R00.1

## 2018-12-19 NOTE — Progress Notes (Signed)
Virtual Visit via Telephone Note   This visit type was conducted due to national recommendations for restrictions regarding the COVID-19 Pandemic (e.g. social distancing) in an effort to limit this patient's exposure and mitigate transmission in our community.  Due to his co-morbid illnesses, this patient is at least at moderate risk for complications without adequate follow up.  This format is felt to be most appropriate for this patient at this time.  The patient did not have access to video technology/had technical difficulties with video requiring transitioning to audio format only (telephone).  All issues noted in this document were discussed and addressed.  No physical exam could be performed with this format.  Please refer to the patient's chart for his  consent to telehealth for [facility].   Evaluation Performed:  Follow-up visit  Date:  12/19/2018   ID:  Harry Norris, DOB 1936/06/19, MRN [id number]  Patient Location: Home Provider Location: Office  PCP:  [name], MD  Cardiologist:  No primary care provider on file. Dr Patrica Duel Electrophysiologist:  None   Chief Complaint:  fatigue  History of Present Illness:   Harry Norris is a 83 y.o. male with a hx of LBBB, hypertension, hyperlipidemia, syncope with orthostatic hypotension, PAD, COPD, chronic venous insufficieny and 3.7 cm AAA and cardiomyopathy EF 35-40% in July 2019 who was  last seen 1  week ago with bradycardia and malaise and his beat blocker was discontinued..   [name] has not felt well in the last month.  He has overwhelming fatigue lightheadedness and apparently his heart rate was slow when he was seen by his PCP in March and a question of stopping beta-blocker was raised.  He has had symptomatic orthostatic hypotension in the past and had his wife check his blood pressure while you are on the phone standing 132/68.  He also has a vague nausea has not vomited he has no pain when he eats no change  in his bowels and has lost about 10 pounds in the last month.  His heart rate at home runs in the range of 50 bpm.  He has had no chest pain or syncope orthopnea or TIA.  Labs done his PCP office 11/17/2018 shows cholesterol 133 HDL 30 LDL 61 creatinine 1.56 GFR 41 cc/min stage III CKD hemoglobin 11.5.  He had an EKG performed at his PCO and I  requested a copy   Since his beta-blocker was discontinued he is fully recovered strength endurance initiated overall normal heart rates are running 52 to 71 bpm blood pressure 123/42 139/53.  During the timeframe of being ill he was taken trimethoprim sulfamethoxazole for MRSA skin lesion on the buttock.  He had stopped it wants to resume and I told him just restarted daily for few days and if tolerated twice a day.  Part of his symptoms initially was anorexia nausea and weight loss which may have been related to the antibiotic.  He is wearing a ZIO monitor he has 5 more days he will mail it back and he has no edema shortness of breath chest pain palpitation or syncope.  He practices social distancing wears a mask and good handwashing and sanitation technique  The patient does not have symptoms concerning for COVID-19 infection (fever, chills, cough, or new shortness of breath).    Past Medical History:  Diagnosis Date   AAA (abdominal aortic aneurysm) ([place])    Anemia    Asthma 02/07/2018   Benign neoplasm of colon  Bronchitis    Cancer ([place])    prostate   Cardiomyopathy ([place]) 12/18/2015   Carotid artery occlusion    Chronic rhinitis 02/26/2016   Chronic venous insufficiency 12/18/2015   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   PFTs 01/18/14:  FeV1 1.58 45% FVC 2.0 50% FeV1/FVC 79% Fef 25-75 795 ( 40% improved after BD) CXR: Copd changes ONO 03/07/14 desats to <88% on RA    Diverticulosis of colon (without mention of hemorrhage)    DM type 2 (diabetes mellitus, type 2) ([place])    Hyperlipidemia    Hypertension    Hypoxemia 04/10/2014    Overview:  Last Assessment & Plan:  Cont nocturnal oxygen therapy 2L    Kidney stone    LBBB (left bundle branch block) 12/18/2015   Leg swelling 11/28/2013   Other primary cardiomyopathies    Other symptoms involving cardiovascular system 11/28/2013   Prostate cancer ([place])    prostate    Syncope 12/18/2015   Unspecified venous (peripheral) insufficiency    Varicose veins    Varicose veins of lower extremities with other complications 5/64/3329   Past Surgical History:  Procedure Laterality Date    cardiac catheterization  may 2011   CATARACT EXTRACTION Bilateral    ENDOVENOUS ABLATION SAPHENOUS VEIN W/ LASER Right 03-29-2014   EVLA right greater saphenous vein     gold seed implants  2011   treatment of prostate cancer   HERNIA REPAIR Bilateral    LITHOTRIPSY  2010   right ear surgery       No outpatient medications have been marked as taking for the 12/19/18 encounter (Appointment) with Richardo Priest, MD.     Allergies:   Ace inhibitors   Social History   Tobacco Use   Smoking status: Former Smoker    Packs/day: 2.50    Years: 40.00    Pack years: 100.00    Types: Cigarettes    Last attempt to quit: 09/01/1999    Years since quitting: 19.3   Smokeless tobacco: Never Used  Substance Use Topics   Alcohol use: No   Drug use: No     Family Hx: The patient's family history includes Asthma in his sister; Cancer - Colon in his mother; Diabetes in his mother; Emphysema in his sister; Heart disease in his sister; Heart failure in his father; Hypertension in his mother; Kidney failure in his father.  ROS:   Please see the history of present illness.     All other systems reviewed and are negative.   Prior CV studies:   The following studies were reviewed today:  He is wearing a Zio monitor  Labs/Other Tests and Data Reviewed:    EKG:  EKG was requested last visit still pending  Recent Labs: No results found for requested labs within last 8760  hours.   Recent Lipid Panel No results found for: CHOL, TRIG, HDL, CHOLHDL, LDLCALC, LDLDIRECT  Wt Readings from Last 3 Encounters:  12/12/18 182 lb 6.4 oz (82.7 kg)  10/18/18 184 lb 12.8 oz (83.8 kg)  06/28/18 191 lb 12.8 oz (87 kg)     Objective:    Vital Signs:  There were no vitals taken for this visit.   VITAL SIGNS:  reviewed  ASSESSMENT & PLAN:    1. Fatigue is resolved I think that with his bradycardia and malaise beta-blockers are not a good choice and I do not plan on reinitiating.  At his next visit we will recheck echocardiogram and  if EF is changed worsened we can transition to [facility]. 2. Stable plan to check an echocardiogram next office visit September 3. Improved off beta-blocker would not resume 4. Stable blood pressure target continue current treatment with diuretic that he is taken long-term for edema and ARB. 5. Orthostatic hypotension resolved he has had no postural shift checking at home 6. Hyperlipidemia stable continue current treatment statin and fenofibrate lipids are ideal  COVID-19 Education: The signs and symptoms of COVID-19 were discussed with the patient and how to seek care for testing (follow up with PCP or arrange E-visit).  The importance of social distancing was discussed today.  Time:   Today, I have spent 25 minutes with the patient with telehealth technology discussing the above problems.     Medication Adjustments/Labs and Tests Ordered: Current medicines are reviewed at length with the patient today.  Concerns regarding medicines are outlined above.   Tests Ordered: No orders of the defined types were placed in this encounter.   Medication Changes: No orders of the defined types were placed in this encounter.   Disposition:  Follow up September 2020  Signed, Shirlee More, MD  12/19/2018 8:12 AM    [place]

## 2018-12-19 NOTE — Patient Instructions (Addendum)
Medication Instructions:  Your physician recommends that you continue on your current medications as directed. Please refer to the Current Medication list given to you today.  If you need a refill on your cardiac medications before your next appointment, please call your pharmacy.   Lab work: None  If you have labs (blood work) drawn today and your tests are completely normal, you will receive your results only by: Marland Kitchen MyChart Message (if you have MyChart) OR . A paper copy in the mail If you have any lab test that is abnormal or we need to change your treatment, we will call you to review the results.  Testing/Procedures: None  Follow-Up: At [facility], you and your health needs are our priority.  As part of our continuing mission to provide you with exceptional heart care, we have created designated Provider Care Teams.  These Care Teams include your primary Cardiologist (physician) and Advanced Practice Providers (APPs -  Physician Assistants and Nurse Practitioners) who all work together to provide you with the care you need, when you need it. You will need a follow up appointment in 5 months: Wednesday, 05/03/2019, at 8:20 am in the [place] office.

## 2018-12-27 DIAGNOSIS — R55 Syncope and collapse: Secondary | ICD-10-CM | POA: Diagnosis not present

## 2018-12-28 ENCOUNTER — Other Ambulatory Visit: Payer: Self-pay

## 2018-12-28 NOTE — Patient Outreach (Signed)
[facility] [facility]) Care Management  12/28/2018  [name] 1935-11-03 [id number]   Medication Adherence call to [name] spoke with patient he is due on Losartan 100 mg and Atorvastatin 40 mg he explain he takes 1 tablet daily and his wife keeps up with all of his medication he already order both medications from mail order and will received an order soon. [name] is showing past due under Harry Norris.   [facility] Management Direct Dial [phone]  Fax [phone] [email or web address]

## 2019-01-04 DIAGNOSIS — J439 Emphysema, unspecified: Secondary | ICD-10-CM | POA: Diagnosis not present

## 2019-01-10 DIAGNOSIS — J449 Chronic obstructive pulmonary disease, unspecified: Secondary | ICD-10-CM | POA: Diagnosis not present

## 2019-01-17 DIAGNOSIS — H16103 Unspecified superficial keratitis, bilateral: Secondary | ICD-10-CM | POA: Diagnosis not present

## 2019-01-25 DIAGNOSIS — Z9181 History of falling: Secondary | ICD-10-CM | POA: Diagnosis not present

## 2019-01-25 DIAGNOSIS — E785 Hyperlipidemia, unspecified: Secondary | ICD-10-CM | POA: Diagnosis not present

## 2019-01-25 DIAGNOSIS — Z0000 Encounter for general adult medical examination without abnormal findings: Secondary | ICD-10-CM | POA: Diagnosis not present

## 2019-02-04 DIAGNOSIS — J439 Emphysema, unspecified: Secondary | ICD-10-CM | POA: Diagnosis not present

## 2019-02-09 DIAGNOSIS — D638 Anemia in other chronic diseases classified elsewhere: Secondary | ICD-10-CM | POA: Diagnosis not present

## 2019-02-09 DIAGNOSIS — J449 Chronic obstructive pulmonary disease, unspecified: Secondary | ICD-10-CM | POA: Diagnosis not present

## 2019-02-09 DIAGNOSIS — E785 Hyperlipidemia, unspecified: Secondary | ICD-10-CM | POA: Diagnosis not present

## 2019-02-09 DIAGNOSIS — E1159 Type 2 diabetes mellitus with other circulatory complications: Secondary | ICD-10-CM | POA: Diagnosis not present

## 2019-02-09 DIAGNOSIS — I5022 Chronic systolic (congestive) heart failure: Secondary | ICD-10-CM | POA: Diagnosis not present

## 2019-02-15 DIAGNOSIS — J449 Chronic obstructive pulmonary disease, unspecified: Secondary | ICD-10-CM | POA: Diagnosis not present

## 2019-02-23 ENCOUNTER — Telehealth: Payer: Self-pay | Admitting: *Deleted

## 2019-02-23 NOTE — Telephone Encounter (Signed)
Let pt know the results of the monitor. Pt verbalized understanding  His heart rate is good without the beta-blocker does not need to consider a pacemaker and I would not change his medications

## 2019-03-06 DIAGNOSIS — J439 Emphysema, unspecified: Secondary | ICD-10-CM | POA: Diagnosis not present

## 2019-03-21 DIAGNOSIS — J449 Chronic obstructive pulmonary disease, unspecified: Secondary | ICD-10-CM | POA: Diagnosis not present

## 2019-03-22 ENCOUNTER — Ambulatory Visit (INDEPENDENT_AMBULATORY_CARE_PROVIDER_SITE_OTHER): Payer: Medicare Other | Admitting: Emergency Medicine

## 2019-03-22 ENCOUNTER — Encounter: Payer: Self-pay | Admitting: Emergency Medicine

## 2019-03-22 ENCOUNTER — Other Ambulatory Visit: Payer: Self-pay

## 2019-03-22 DIAGNOSIS — J310 Chronic rhinitis: Secondary | ICD-10-CM | POA: Diagnosis not present

## 2019-03-22 DIAGNOSIS — J432 Centrilobular emphysema: Secondary | ICD-10-CM | POA: Diagnosis not present

## 2019-03-22 NOTE — Assessment & Plan Note (Signed)
Please continue your Perforomist twice a day you have been taking it. Keep your albuterol available to use 2 puffs if needed for shortness of breath, chest tightness, wheezing. You will need the flu shot in the fall Follow with Dr Lamonte Sakai in 6 months or sooner if you have any problems

## 2019-03-22 NOTE — Assessment & Plan Note (Signed)
Agree with decreasing your Afrin use.

## 2019-03-22 NOTE — Progress Notes (Signed)
   Subjective:    Patient ID: Harry Norris, male    DOB: Aug 17, 1936, 83 y.o.   MRN: [id number]  HPI   ROV 10/18/2018 --follow-up visit for 83 year old gentleman with severe COPD and associated chronic hypoxemic respiratory failure.  He also has history of chronic rhinitis associated with drainage and coughing.  He had been managed on Perforomist and Bevespi, but we stopped the [place] last time (he ran out of it and did not think he missed it). Remains on perforomist. Uses albuterol occasionally - once a month. He has some daily cough, produces green mucous in the am. His mobility is being limited by knee pain, has some swelling in his LE. He was last treated with pred + abx by his PCP about 2 months ago, averages 2x a year. Flu shot up to date.   ROV 03/22/2019 --Harry Norris is 83 with severe COPD and associated chronic hypoxemic respiratory failure.  He also has chronic rhinitis that is associated with drainage, cough.  We have been managing him on Perforomist monotherapy.  He has albuterol that he can use as needed. Very rare albuterol use. Less active during isolation. He does still have some drainage and cough, minimal afrin. He reports that he believes he had an AE in February > rx with abx and pred.   DATA: PFTs 01/18/14 FVC 1.75 (40%) FEV1 1.39 (44%), post bronchodilator FEV1 1.58 [50%], +13% F/F 80 Severe obstructive airway disease.  Chest x-ray 12/21/13 Emphysematous changes, bronchitic changes consistent with COPD     Objective:   Physical Exam Vitals:   03/22/19 1421 03/22/19 1422  BP:  120/78  Pulse:  71  Temp: 97.6 F (36.4 C)   TempSrc: Oral   SpO2:  96%  Weight: 187 lb (84.8 kg)   Height: 5\' 10"  (1.778 m)   Gen: Pleasant, obese man, in no distress,  normal affect  ENT: No lesions,  mouth clear,  oropharynx clear, no postnasal drip  Neck: No JVD, no stridor  Lungs: No use of accessory muscles, distant, no wheeze  Cardiovascular: RRR, heart sounds normal, no murmur  or gallops, trace LE edema  Musculoskeletal: No deformities, no cyanosis or clubbing  Neuro: alert, non focal  Skin: Warm, no lesions or rashes       Assessment & Plan:  COPD with emphysema Gold C  Please continue your Perforomist twice a day you have been taking it. Keep your albuterol available to use 2 puffs if needed for shortness of breath, chest tightness, wheezing. You will need the flu shot in the fall Follow with Dr Lamonte Sakai in 6 months or sooner if you have any problems  Chronic rhinitis Agree with decreasing your Afrin use.  Baltazar Apo, MD, PhD 03/22/2019, 2:34 PM [facility] [phone] or if no answer [phone]

## 2019-03-22 NOTE — Patient Instructions (Addendum)
Please continue your Perforomist twice a day you have been taking it. Keep your albuterol available to use 2 puffs if needed for shortness of breath, chest tightness, wheezing. You will need the flu shot in the fall Agree with decreasing your Afrin use. Follow with Dr Lamonte Sakai in 6 months or sooner if you have any problems

## 2019-03-23 DIAGNOSIS — L821 Other seborrheic keratosis: Secondary | ICD-10-CM | POA: Diagnosis not present

## 2019-03-23 DIAGNOSIS — L304 Erythema intertrigo: Secondary | ICD-10-CM | POA: Diagnosis not present

## 2019-03-23 DIAGNOSIS — L578 Other skin changes due to chronic exposure to nonionizing radiation: Secondary | ICD-10-CM | POA: Diagnosis not present

## 2019-03-23 DIAGNOSIS — I8310 Varicose veins of unspecified lower extremity with inflammation: Secondary | ICD-10-CM | POA: Diagnosis not present

## 2019-03-23 DIAGNOSIS — L570 Actinic keratosis: Secondary | ICD-10-CM | POA: Diagnosis not present

## 2019-04-03 DIAGNOSIS — H6122 Impacted cerumen, left ear: Secondary | ICD-10-CM | POA: Diagnosis not present

## 2019-04-03 DIAGNOSIS — H6692 Otitis media, unspecified, left ear: Secondary | ICD-10-CM | POA: Diagnosis not present

## 2019-04-06 DIAGNOSIS — J439 Emphysema, unspecified: Secondary | ICD-10-CM | POA: Diagnosis not present

## 2019-04-12 DIAGNOSIS — H1013 Acute atopic conjunctivitis, bilateral: Secondary | ICD-10-CM | POA: Diagnosis not present

## 2019-04-17 DIAGNOSIS — J449 Chronic obstructive pulmonary disease, unspecified: Secondary | ICD-10-CM | POA: Diagnosis not present

## 2019-04-24 DIAGNOSIS — L02214 Cutaneous abscess of groin: Secondary | ICD-10-CM | POA: Diagnosis not present

## 2019-04-25 ENCOUNTER — Ambulatory Visit (INDEPENDENT_AMBULATORY_CARE_PROVIDER_SITE_OTHER): Payer: Medicare Other | Admitting: Cardiology

## 2019-04-25 ENCOUNTER — Other Ambulatory Visit: Payer: Self-pay

## 2019-04-25 ENCOUNTER — Encounter: Payer: Self-pay | Admitting: Cardiology

## 2019-04-25 VITALS — BP 144/60 | HR 59 | Ht 70.0 in | Wt 186.0 lb

## 2019-04-25 DIAGNOSIS — I951 Orthostatic hypotension: Secondary | ICD-10-CM | POA: Diagnosis not present

## 2019-04-25 DIAGNOSIS — E785 Hyperlipidemia, unspecified: Secondary | ICD-10-CM

## 2019-04-25 DIAGNOSIS — I119 Hypertensive heart disease without heart failure: Secondary | ICD-10-CM | POA: Diagnosis not present

## 2019-04-25 MED ORDER — ASPIRIN EC 81 MG PO TBEC: 81.0000 mg | DELAYED_RELEASE_TABLET | Freq: Every day | ORAL | 3 refills | Status: DC

## 2019-04-25 NOTE — Progress Notes (Signed)
Cardiology Office Note:    Date:  04/25/2019   ID:  Harry Norris, DOB 10/24/1935, MRN [id number]  PCP:  [name], MD  Cardiologist:  Shirlee More, MD    Referring MD: [name], MD    ASSESSMENT:    1. Orthostatic hypotension   2. Hypertensive heart disease without heart failure   3. Hyperlipidemia, unspecified hyperlipidemia type    PLAN:    In order of problems listed above:  1. Stable asymptomatic continue current antihypertensive medication 2. Stable continue treatment including diuretic 3. Continue statin recent lipid profile requested from his PCP   Next appointment: 6 months   Medication Adjustments/Labs and Tests Ordered: Current medicines are reviewed at length with the patient today.  Concerns regarding medicines are outlined above.  No orders of the defined types were placed in this encounter.  Meds ordered this encounter  Medications  . aspirin EC 81 MG tablet    Sig: Take 1 tablet (81 mg total) by mouth daily.    Dispense:  90 tablet    Refill:  3    No chief complaint on file.   History of Present Illness:    Harry Norris is a 83 y.o. male with a hx of LBBB, hypertension, hyperlipidemia, syncope with orthostatic hypotension, PAD, COPD, chronic venous insufficieny and 3.7 cm AAA and cardiomyopathy EF 35-40% in July 2019 and bradycardia and malaise and his beat blocker was discontinued.Marland Kitchen  He was last seen 12/01/2018 virtual visit Compliance with diet, lifestyle and medications: Yes Past Medical History:  Diagnosis Date  . AAA (abdominal aortic aneurysm) ([place])   . Anemia   . Asthma 02/07/2018  . Benign neoplasm of colon   . Bronchitis   . Cancer [facility])    prostate  . Cardiomyopathy ([place]) 12/18/2015  . Carotid artery occlusion   . Chronic rhinitis 02/26/2016  . Chronic venous insufficiency 12/18/2015  . 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   PFTs 01/18/14:  FeV1 1.58 45% FVC 2.0 50% FeV1/FVC 79% Fef 25-75 795 ( 40% improved  after BD) CXR: Copd changes ONO 03/07/14 desats to <88% on RA   . Diverticulosis of colon (without mention of hemorrhage)   . DM type 2 (diabetes mellitus, type 2) ([place])   . Hyperlipidemia   . Hypertension   . Hypoxemia 04/10/2014   Overview:  Last Assessment & Plan:  Cont nocturnal oxygen therapy 2L   . Kidney stone   . LBBB (left bundle branch block) 12/18/2015  . Leg swelling 11/28/2013  . Other primary cardiomyopathies   . Other symptoms involving cardiovascular system 11/28/2013  . Prostate cancer [facility])    prostate   . Syncope 12/18/2015  . Unspecified venous (peripheral) insufficiency   . Varicose veins   . Varicose veins of lower extremities with other complications 09/30/8655    Past Surgical History:  Procedure Laterality Date  .  cardiac catheterization  may 2011  . CATARACT EXTRACTION Bilateral   . ENDOVENOUS ABLATION SAPHENOUS VEIN W/ LASER Right 03-29-2014   EVLA right greater saphenous vein    . gold seed implants  2011   treatment of prostate cancer  . HERNIA REPAIR Bilateral   . LITHOTRIPSY  2010  . right ear surgery      Current Medications: Current Meds  Medication Sig  . atorvastatin (LIPITOR) 40 MG tablet Take 40 mg by mouth daily.  . cyclobenzaprine (FLEXERIL) 10 MG tablet Take 10 mg by mouth 3 (three) times daily as  needed for muscle spasms.  . fenofibrate 160 MG tablet Take 160 mg by mouth daily.  . formoterol (PERFOROMIST) 20 MCG/2ML nebulizer solution Take 2 mLs (20 mcg total) by nebulization 2 (two) times daily.  . furosemide (LASIX) 40 MG tablet Take 40 mg by mouth daily.   Marland Kitchen losartan (COZAAR) 100 MG tablet Take 100 mg by mouth daily.  . meclizine (ANTIVERT) 25 MG tablet Take 25 mg by mouth as needed.   . Methylcellulose, Laxative, (CITRUCEL) 500 MG TABS Take 500 mg by mouth 2 (two) times daily.  . Multiple Vitamin (MULTIVITAMIN) capsule Take 1 capsule by mouth daily.  . Omega-3 Fatty Acids (FISH OIL) 1000 MG CAPS Take 1,000 mg by mouth 2 (two) times  daily.  . promethazine (PHENERGAN) 25 MG tablet Take 25 mg by mouth every 6 (six) hours as needed for nausea or vomiting.  . pseudoephedrine-acetaminophen (TYLENOL SINUS) 30-500 MG TABS tablet Take 1 tablet by mouth daily.   . vitamin C (ASCORBIC ACID) 500 MG tablet Take 500 mg by mouth daily.  . vitamin E 400 UNIT capsule Take 400 Units by mouth daily.  . [DISCONTINUED] aspirin 325 MG EC tablet Take 325 mg by mouth daily.     Allergies:   Ace inhibitors   Social History   Socioeconomic History  . Marital status: Married    Spouse name: Not on file  . Number of children: Not on file  . Years of education: Not on file  . Highest education level: Not on file  Occupational History  . Not on file  Social Needs  . Financial resource strain: Not on file  . Food insecurity    Worry: Not on file    Inability: Not on file  . Transportation needs    Medical: Not on file    Non-medical: Not on file  Tobacco Use  . Smoking status: Former Smoker    Packs/day: 2.50    Years: 40.00    Pack years: 100.00    Types: Cigarettes    Quit date: 09/01/1999    Years since quitting: 19.6  . Smokeless tobacco: Never Used  Substance and Sexual Activity  . Alcohol use: No  . Drug use: No  . Sexual activity: Not on file  Lifestyle  . Physical activity    Days per week: Not on file    Minutes per session: Not on file  . Stress: Not on file  Relationships  . Social Herbalist on phone: Not on file    Gets together: Not on file    Attends religious service: Not on file    Active member of club or organization: Not on file    Attends meetings of clubs or organizations: Not on file    Relationship status: Not on file  Other Topics Concern  . Not on file  Social History Narrative   Lives with wife.   Retired.   Telephone business prior.     Family History: The patient's family history includes Asthma in his sister; Cancer - Colon in his mother; Diabetes in his mother; Emphysema in  his sister; Heart disease in his sister; Heart failure in his father; Hypertension in his mother; Kidney failure in his father. ROS:   Please see the history of present illness.    All other systems reviewed and are negative.  EKGs/Labs/Other Studies Reviewed:    The following studies were reviewed today:  EKG done today after stopping his beta-blocker shows an improvement  sinus rate 76 bpm nonspecific conduction delay with right axis deviation.  Recent Labs: No results found for requested labs within last 8760 hours.  Recent Lipid Panel No results found for: CHOL, TRIG, HDL, CHOLHDL, VLDL, LDLCALC, LDLDIRECT  Physical Exam:    VS:  BP (!) 144/60 (BP Location: Left Arm, Patient Position: Sitting, Cuff Size: Normal)   Pulse (!) 59   Ht 5\' 10"  (1.778 m)   Wt 186 lb (84.4 kg)   SpO2 93%   BMI 26.69 kg/m     Wt Readings from Last 3 Encounters:  04/25/19 186 lb (84.4 kg)  03/22/19 187 lb (84.8 kg)  12/19/18 182 lb (82.6 kg)     GEN:  Well nourished, well developed in no acute distress HEENT: Normal NECK: No JVD; No carotid bruits LYMPHATICS: No lymphadenopathy CARDIAC: Paradoxical second heart sound RRR, no murmurs, rubs, gallops RESPIRATORY:  Clear to auscultation without rales, wheezing or rhonchi  ABDOMEN: Soft, non-tender, non-distended MUSCULOSKELETAL:  No edema; No deformity  SKIN: Warm and dry NEUROLOGIC:  Alert and oriented x 3 PSYCHIATRIC:  Normal affect    Signed, Shirlee More, MD  04/25/2019 4:24 PM    [facility]

## 2019-04-25 NOTE — Patient Instructions (Signed)
Medication Instructions:  Your physician has recommended you make the following change in your medication: STOP taking aspirin 325 mg   START taking aspirin 81 mg (1 tablet) once daily  If you need a refill on your cardiac medications before your next appointment, please call your pharmacy.   Lab work: NONE If you have labs (blood work) drawn today and your tests are completely normal, you will receive your results only by: Marland Kitchen MyChart Message (if you have MyChart) OR . A paper copy in the mail If you have any lab test that is abnormal or we need to change your treatment, we will call you to review the results.  Testing/Procedures: You had an EKG performed today  Follow-Up: At [facility], you and your health needs are our priority.  As part of our continuing mission to provide you with exceptional heart care, we have created designated Provider Care Teams.  These Care Teams include your primary Cardiologist (physician) and Advanced Practice Providers (APPs -  Physician Assistants and Nurse Practitioners) who all work together to provide you with the care you need, when you need it. You will need a follow up appointment in 6 months.    Any Other Special Instructions Will Be Listed Below  Aspirin and Your Heart  Aspirin is a medicine that prevents the cells in the blood that are used for clotting, called platelets, from sticking together. Aspirin can be used to help reduce the risk of blood clots, heart attacks, and other heart-related problems. Can I take aspirin? Your health care provider will help you determine whether it is safe and beneficial for you to take aspirin daily. Taking aspirin daily may be helpful if you:  Have had a heart attack or chest pain.  Are at risk for a heart attack.  Have undergone open-heart surgery, such as coronary artery bypass surgery (CABG).  Have had coronary angioplasty or a stent.  Have had certain types of stroke or transient ischemic attack  (TIA).  Have peripheral artery disease (PAD).  Have chronic heart rhythm problems such as atrial fibrillation and cannot take an anticoagulant.  Have valve disease or have had surgery on a valve. What are the risks? Daily use of aspirin can cause side effects. Some of these include:  Bleeding. Bleeding problems can be minor or serious. An example of a minor problem is a cut that does not stop bleeding. An example of a more serious problem is stomach bleeding or, rarely, bleeding into the brain. Your risk of bleeding is increased if you are also taking non-steroidal anti-inflammatory drugs (NSAIDs).  Increased bruising.  Upset stomach.  An allergic reaction. People who have nasal polyps have an increased risk of developing an aspirin allergy. General guidelines  Take aspirin only as told by your health care provider. Make sure that you understand how much you should take and what form you should take. The two forms of aspirin are: ? Non-enteric-coated.This type of aspirin does not have a coating and is absorbed quickly. This type of aspirin also comes in a chewable form. ? Enteric-coated. This type of aspirin has a coating that releases the medicine very slowly. Enteric-coated aspirin might cause less stomach upset than non-enteric-coated aspirin. This type of aspirin should not be chewed or crushed.  Limit alcohol intake to no more than 1 drink a day for nonpregnant women and 2 drinks a day for men. Drinking alcohol increases your risk of bleeding. One drink equals 12 oz of beer, 5 oz of  wine, or 1 oz of hard liquor. Contact a health care provider if you:  Have unusual bleeding or bruising.  Have stomach pain or nausea.  Have ringing in your ears.  Have an allergic reaction that causes: ? Hives. ? Itchy skin. ? Swelling of the lips, tongue, or face. Get help right away if you:  Notice that your bowel movements are bloody, dark red, or black in color.  Vomit or cough up blood.   Have blood in your urine.  Cough, have noisy breathing (wheeze), or feel short of breath.  Have chest pain, especially if the pain spreads to the arms, back, neck, or jaw.  Have a severe headache, or a headache with confusion, or dizziness. These symptoms may represent a serious problem that is an emergency. Do not wait to see if the symptoms will go away. Get medical help right away. Call your local emergency services (911 in the [place] Do not drive yourself to the hospital. Summary  Aspirin can be used to help reduce the risk of blood clots, heart attacks, and other heart-related problems.  Daily use of aspirin can increase your risk of side effects. Your health care provider will help you determine whether it is safe and beneficial for you to take aspirin daily.  Take aspirin only as told by your health care provider. Make sure that you understand how much you can take and what form you can take. This information is not intended to replace advice given to you by your health care provider. Make sure you discuss any questions you have with your health care provider. Document Released: 07/30/2008 Document Revised: 06/17/2017 Document Reviewed: 06/17/2017 Elsevier Patient Education  2020 [employer].

## 2019-05-03 ENCOUNTER — Ambulatory Visit: Payer: Medicare Other | Admitting: Cardiology

## 2019-05-07 DIAGNOSIS — J439 Emphysema, unspecified: Secondary | ICD-10-CM | POA: Diagnosis not present

## 2019-05-12 DIAGNOSIS — Z23 Encounter for immunization: Secondary | ICD-10-CM | POA: Diagnosis not present

## 2019-05-12 DIAGNOSIS — I5022 Chronic systolic (congestive) heart failure: Secondary | ICD-10-CM | POA: Diagnosis not present

## 2019-05-12 DIAGNOSIS — E1159 Type 2 diabetes mellitus with other circulatory complications: Secondary | ICD-10-CM | POA: Diagnosis not present

## 2019-05-12 DIAGNOSIS — D638 Anemia in other chronic diseases classified elsewhere: Secondary | ICD-10-CM | POA: Diagnosis not present

## 2019-05-12 DIAGNOSIS — E785 Hyperlipidemia, unspecified: Secondary | ICD-10-CM | POA: Diagnosis not present

## 2019-05-22 DIAGNOSIS — N200 Calculus of kidney: Secondary | ICD-10-CM | POA: Diagnosis not present

## 2019-05-26 DIAGNOSIS — J449 Chronic obstructive pulmonary disease, unspecified: Secondary | ICD-10-CM | POA: Diagnosis not present

## 2019-06-06 DIAGNOSIS — J439 Emphysema, unspecified: Secondary | ICD-10-CM | POA: Diagnosis not present

## 2019-07-03 DIAGNOSIS — J449 Chronic obstructive pulmonary disease, unspecified: Secondary | ICD-10-CM | POA: Diagnosis not present

## 2019-07-07 DIAGNOSIS — J439 Emphysema, unspecified: Secondary | ICD-10-CM | POA: Diagnosis not present

## 2019-07-11 ENCOUNTER — Other Ambulatory Visit: Payer: Self-pay

## 2019-07-11 NOTE — Patient Outreach (Signed)
[facility]) Care Management  07/11/2019  NATHANEAL SOMMERS [birth year] [id number]   Medication Adherence call to [name] Hippa Identifiers Verify spoke with patient he is past due on Atorvastatin 40 mg patient explain he takes 1 tablet every evening ,patient has enough until the end of the year,patient explain wife order it on a regular basis.[name] is showing past due under [place].   [facility] Management Direct Dial [phone]  Fax [phone] [email or web address]

## 2019-08-01 ENCOUNTER — Other Ambulatory Visit: Payer: Self-pay

## 2019-08-01 NOTE — Patient Outreach (Signed)
[facility]) Care Management  08/01/2019  Harry Norris 1936/06/27 [id number]   Medication Adherence call to [name] Hippa Identifiers Verify spoke with patient he is past due on Atorvastatin 40 mg and Losartan 100 mg,patient explain he takes both medications once daily,patient explain he has enough medication until the end of the year. [name] is showing past due under [place].  [facility] Management Direct Dial [phone]  Fax [phone] [email or web address]

## 2019-08-06 DIAGNOSIS — J439 Emphysema, unspecified: Secondary | ICD-10-CM | POA: Diagnosis not present

## 2019-08-09 DIAGNOSIS — J449 Chronic obstructive pulmonary disease, unspecified: Secondary | ICD-10-CM | POA: Diagnosis not present

## 2019-08-14 DIAGNOSIS — J449 Chronic obstructive pulmonary disease, unspecified: Secondary | ICD-10-CM | POA: Diagnosis not present

## 2019-08-14 DIAGNOSIS — E1159 Type 2 diabetes mellitus with other circulatory complications: Secondary | ICD-10-CM | POA: Diagnosis not present

## 2019-08-14 DIAGNOSIS — E785 Hyperlipidemia, unspecified: Secondary | ICD-10-CM | POA: Diagnosis not present

## 2019-08-14 DIAGNOSIS — I5022 Chronic systolic (congestive) heart failure: Secondary | ICD-10-CM | POA: Diagnosis not present

## 2019-08-14 DIAGNOSIS — D638 Anemia in other chronic diseases classified elsewhere: Secondary | ICD-10-CM | POA: Diagnosis not present

## 2019-09-04 DIAGNOSIS — M7989 Other specified soft tissue disorders: Secondary | ICD-10-CM | POA: Diagnosis not present

## 2019-09-04 DIAGNOSIS — R600 Localized edema: Secondary | ICD-10-CM | POA: Diagnosis not present

## 2019-09-04 DIAGNOSIS — M545 Low back pain: Secondary | ICD-10-CM | POA: Diagnosis not present

## 2019-09-04 DIAGNOSIS — L03115 Cellulitis of right lower limb: Secondary | ICD-10-CM | POA: Diagnosis not present

## 2019-09-04 DIAGNOSIS — G8929 Other chronic pain: Secondary | ICD-10-CM | POA: Diagnosis not present

## 2019-09-06 DIAGNOSIS — J439 Emphysema, unspecified: Secondary | ICD-10-CM | POA: Diagnosis not present

## 2019-09-06 DIAGNOSIS — H6691 Otitis media, unspecified, right ear: Secondary | ICD-10-CM | POA: Diagnosis not present

## 2019-09-11 DIAGNOSIS — J449 Chronic obstructive pulmonary disease, unspecified: Secondary | ICD-10-CM | POA: Diagnosis not present

## 2019-09-12 DIAGNOSIS — I890 Lymphedema, not elsewhere classified: Secondary | ICD-10-CM | POA: Diagnosis not present

## 2019-09-20 DIAGNOSIS — I5022 Chronic systolic (congestive) heart failure: Secondary | ICD-10-CM | POA: Diagnosis not present

## 2019-09-20 DIAGNOSIS — I10 Essential (primary) hypertension: Secondary | ICD-10-CM | POA: Diagnosis not present

## 2019-09-20 DIAGNOSIS — I890 Lymphedema, not elsewhere classified: Secondary | ICD-10-CM | POA: Diagnosis not present

## 2019-09-20 DIAGNOSIS — J449 Chronic obstructive pulmonary disease, unspecified: Secondary | ICD-10-CM | POA: Diagnosis not present

## 2019-10-07 DIAGNOSIS — J439 Emphysema, unspecified: Secondary | ICD-10-CM | POA: Diagnosis not present

## 2019-11-04 DIAGNOSIS — J439 Emphysema, unspecified: Secondary | ICD-10-CM | POA: Diagnosis not present

## 2019-11-09 DIAGNOSIS — J440 Chronic obstructive pulmonary disease with acute lower respiratory infection: Secondary | ICD-10-CM | POA: Diagnosis not present

## 2019-11-09 DIAGNOSIS — J209 Acute bronchitis, unspecified: Secondary | ICD-10-CM | POA: Diagnosis not present

## 2019-11-14 DIAGNOSIS — D638 Anemia in other chronic diseases classified elsewhere: Secondary | ICD-10-CM | POA: Diagnosis not present

## 2019-11-14 DIAGNOSIS — Z139 Encounter for screening, unspecified: Secondary | ICD-10-CM | POA: Diagnosis not present

## 2019-11-14 DIAGNOSIS — E1159 Type 2 diabetes mellitus with other circulatory complications: Secondary | ICD-10-CM | POA: Diagnosis not present

## 2019-11-14 DIAGNOSIS — I5022 Chronic systolic (congestive) heart failure: Secondary | ICD-10-CM | POA: Diagnosis not present

## 2019-11-14 DIAGNOSIS — E785 Hyperlipidemia, unspecified: Secondary | ICD-10-CM | POA: Diagnosis not present

## 2019-11-28 NOTE — Progress Notes (Signed)
Cardiology Office Note:    Date:  11/29/2019   ID:  Harry Norris, DOB Dec 23, 1935, MRN [id number]  PCP:  [name], MD  Cardiologist:  Shirlee More, MD    Referring MD: [name], MD    ASSESSMENT:    1. Hypertensive heart disease without heart failure   2. Hyperlipidemia, unspecified hyperlipidemia type   3. LBBB (left bundle branch block)   4. Bradycardia   5. Syncope, unspecified syncope type    PLAN:    In order of problems listed above:  1. Stable BP at target.orthostatic symptoms continue his medical therapy including his loop diuretic ARB. 2. Lipids are ideal continue lipid-lowering therapy with statin and fenofibrate 3. Stable EKG 4. Avoid rate slowing medications 5. No recurrence     Next appointment: 6 months   Medication Adjustments/Labs and Tests Ordered: Current medicines are reviewed at length with the patient today.  Concerns regarding medicines are outlined above.  No orders of the defined types were placed in this encounter.  No orders of the defined types were placed in this encounter.   Chief Complaint  Patient presents with  . Follow-up  . Cardiomyopathy    History of Present Illness:    Harry Norris is a 84 y.o. male with a hx of LBBB, hypertension, hyperlipidemia, syncope with orthostatic hypotension, PAD, COPD, chronic venous insufficieny and 3.7 cm AAA followed by vascular surgeon and cardiomyopathy EF 35-40% in July 2019 and bradycardia and malaise and his beat blocker was discontinued. He was last seen 04/25/2019. Compliance with diet, lifestyle and medications: Yes  Is understandably tearful about the death of his wife.  Fortunately has a son who looks in on him and overall is doing well he has had no edema shortness of breath orthopnea chest pain palpitation or syncope.  He was seen by his primary care and recent labs performed 11/14/2019 shows a cholesterol 141 HDL 43 LDL 57 creatinine mildly elevated 1.3.  He  tolerates his antiplatelet therapy without GI upset or bleeding and statin without muscle pain or weakness.  At this time he does not feel and I do not feel he requires a repeat perfusion study or echocardiogram.  We will do an EKG prior to leaving my office. Past Medical History:  Diagnosis Date  . AAA (abdominal aortic aneurysm) ([place])   . Anemia   . Asthma 02/07/2018  . Benign neoplasm of colon   . Bradycardia 12/19/2018  . Bronchitis   . Cancer [facility])    prostate  . Cardiomyopathy ([place]) 12/18/2015  . Carotid artery occlusion   . Chronic rhinitis 02/26/2016  . Chronic venous insufficiency 12/18/2015  . 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   PFTs 01/18/14:  FeV1 1.58 45% FVC 2.0 50% FeV1/FVC 79% Fef 25-75 795 ( 40% improved after BD) CXR: Copd changes ONO 03/07/14 desats to <88% on RA   . Diverticulosis of colon (without mention of hemorrhage)   . DM type 2 (diabetes mellitus, type 2) ([place])   . Fatigue 12/12/2018  . Hyperlipidemia   . Hypertension   . Hypertensive heart disease   . Hypoxemia 04/10/2014   Overview:  Last Assessment & Plan:  Cont nocturnal oxygen therapy 2L   . Kidney stone   . LBBB (left bundle branch block) 12/18/2015  . Leg swelling 11/28/2013  . Orthostatic hypotension 12/19/2018  . Other primary cardiomyopathies   . Other symptoms involving cardiovascular system 11/28/2013  . Prostate cancer ([place])  prostate   . Syncope 12/18/2015  . Unspecified venous (peripheral) insufficiency   . Varicose veins   . Varicose veins of lower extremities with other complications 4/76/5465    Past Surgical History:  Procedure Laterality Date  .  cardiac catheterization  may 2011  . CATARACT EXTRACTION Bilateral   . ENDOVENOUS ABLATION SAPHENOUS VEIN W/ LASER Right 03-29-2014   EVLA right greater saphenous vein    . gold seed implants  2011   treatment of prostate cancer  . HERNIA REPAIR Bilateral   . LITHOTRIPSY  2010  . right ear surgery      Current Medications: Current  Meds  Medication Sig  . aspirin 325 MG tablet Take 325 mg by mouth daily.  Marland Kitchen atorvastatin (LIPITOR) 40 MG tablet Take 40 mg by mouth daily.  . budesonide (PULMICORT) 180 MCG/ACT inhaler Inhale 2 puffs into the lungs 2 (two) times daily.  . fenofibrate 160 MG tablet Take 160 mg by mouth daily.  . formoterol (PERFOROMIST) 20 MCG/2ML nebulizer solution Take 20 mcg by nebulization 2 (two) times daily.  . furosemide (LASIX) 40 MG tablet Take 40 mg by mouth daily.   . Glycopyrrolate-Formoterol (BEVESPI AEROSPHERE) 9-4.8 MCG/ACT AERO Inhale 2 puffs into the lungs daily.  . IPRATROPIUM BROMIDE HFA IN Inhale 0.5 mg into the lungs daily as needed.  Marland Kitchen losartan (COZAAR) 100 MG tablet Take 100 mg by mouth daily.  . Methylcellulose, Laxative, (CITRUCEL) 500 MG TABS Take 500 mg by mouth 2 (two) times daily.  . Multiple Vitamin (MULTIVITAMIN) capsule Take 1 capsule by mouth daily.  . Omega-3 Fatty Acids (FISH OIL) 1000 MG CAPS Take 1,000 mg by mouth 2 (two) times daily.     Allergies:   Ace inhibitors   Social History   Socioeconomic History  . Marital status: Married    Spouse name: Not on file  . Number of children: Not on file  . Years of education: Not on file  . Highest education level: Not on file  Occupational History  . Not on file  Tobacco Use  . Smoking status: Former Smoker    Packs/day: 2.50    Years: 40.00    Pack years: 100.00    Types: Cigarettes    Quit date: 09/01/1999    Years since quitting: 20.2  . Smokeless tobacco: Never Used  Substance and Sexual Activity  . Alcohol use: No  . Drug use: No  . Sexual activity: Not on file  Other Topics Concern  . Not on file  Social History Narrative   Lives with wife.   Retired.   Telephone business prior.   Social Determinants of Health   Financial Resource Strain:   . Difficulty of Paying Living Expenses:   Food Insecurity:   . Worried About Charity fundraiser in the Last Year:   . Arboriculturist in the Last Year:     Transportation Needs:   . Film/video editor (Medical):   Marland Kitchen Lack of Transportation (Non-Medical):   Physical Activity:   . Days of Exercise per Week:   . Minutes of Exercise per Session:   Stress:   . Feeling of Stress :   Social Connections:   . Frequency of Communication with Friends and Family:   . Frequency of Social Gatherings with Friends and Family:   . Attends Religious Services:   . Active Member of Clubs or Organizations:   . Attends Archivist Meetings:   Marland Kitchen Marital Status:  Family History: The patient's family history includes Asthma in his sister; Cancer - Colon in his mother; Diabetes in his mother; Emphysema in his sister; Heart disease in his sister; Heart failure in his father; Hypertension in his mother; Kidney failure in his father. ROS:   Please see the history of present illness.    All other systems reviewed and are negative.  EKGs/Labs/Other Studies Reviewed:    The following studies were reviewed today:  EKG:  EKG ordered today and personally reviewed.  The ekg ordered today demonstrates stable pattern sinus rhythm normal PR interval left bundle branch block QRS duration 144 ms.   Physical Exam:    VS:  BP 138/60   Pulse 75   Temp 97.8 F (36.6 C)   Ht 5\' 9"  (1.753 m)   Wt 181 lb 6.4 oz (82.3 kg)   SpO2 100%   BMI 26.79 kg/m     Wt Readings from Last 3 Encounters:  11/29/19 181 lb 6.4 oz (82.3 kg)  04/25/19 186 lb (84.4 kg)  03/22/19 187 lb (84.8 kg)     GEN: Tearful beginning to look frail well nourished, well developed in no acute distress HEENT: Normal NECK: No JVD; No carotid bruits LYMPHATICS: No lymphadenopathy CARDIAC: S2 paradoxical RRR, no murmurs, rubs, gallops RESPIRATORY:  Clear to auscultation without rales, wheezing or rhonchi  ABDOMEN: Soft, non-tender, non-distended MUSCULOSKELETAL:  No edema; No deformity  SKIN: Warm and dry NEUROLOGIC:  Alert and oriented x 3 PSYCHIATRIC:  Normal affect     Signed, Shirlee More, MD  11/29/2019 9:51 AM    [place]

## 2019-11-29 ENCOUNTER — Encounter: Payer: Self-pay | Admitting: Cardiology

## 2019-11-29 ENCOUNTER — Ambulatory Visit (INDEPENDENT_AMBULATORY_CARE_PROVIDER_SITE_OTHER): Payer: Medicare Other | Admitting: Cardiology

## 2019-11-29 ENCOUNTER — Other Ambulatory Visit: Payer: Self-pay

## 2019-11-29 VITALS — BP 138/60 | HR 75 | Temp 97.8°F | Ht 69.0 in | Wt 181.4 lb

## 2019-11-29 DIAGNOSIS — R55 Syncope and collapse: Secondary | ICD-10-CM | POA: Diagnosis not present

## 2019-11-29 DIAGNOSIS — I119 Hypertensive heart disease without heart failure: Secondary | ICD-10-CM

## 2019-11-29 DIAGNOSIS — E785 Hyperlipidemia, unspecified: Secondary | ICD-10-CM

## 2019-11-29 DIAGNOSIS — I447 Left bundle-branch block, unspecified: Secondary | ICD-10-CM | POA: Diagnosis not present

## 2019-11-29 DIAGNOSIS — R001 Bradycardia, unspecified: Secondary | ICD-10-CM

## 2019-11-29 DIAGNOSIS — J449 Chronic obstructive pulmonary disease, unspecified: Secondary | ICD-10-CM | POA: Diagnosis not present

## 2019-11-29 NOTE — Patient Instructions (Signed)

## 2019-12-26 DIAGNOSIS — N200 Calculus of kidney: Secondary | ICD-10-CM | POA: Diagnosis not present

## 2020-01-19 DIAGNOSIS — H1033 Unspecified acute conjunctivitis, bilateral: Secondary | ICD-10-CM | POA: Diagnosis not present

## 2020-01-22 DIAGNOSIS — J449 Chronic obstructive pulmonary disease, unspecified: Secondary | ICD-10-CM | POA: Diagnosis not present

## 2020-01-31 DIAGNOSIS — J440 Chronic obstructive pulmonary disease with acute lower respiratory infection: Secondary | ICD-10-CM | POA: Diagnosis not present

## 2020-01-31 DIAGNOSIS — J209 Acute bronchitis, unspecified: Secondary | ICD-10-CM | POA: Diagnosis not present

## 2020-02-01 DIAGNOSIS — E785 Hyperlipidemia, unspecified: Secondary | ICD-10-CM | POA: Diagnosis not present

## 2020-02-01 DIAGNOSIS — Z9181 History of falling: Secondary | ICD-10-CM | POA: Diagnosis not present

## 2020-02-01 DIAGNOSIS — Z0000 Encounter for general adult medical examination without abnormal findings: Secondary | ICD-10-CM | POA: Diagnosis not present

## 2020-02-14 DIAGNOSIS — D638 Anemia in other chronic diseases classified elsewhere: Secondary | ICD-10-CM | POA: Diagnosis not present

## 2020-02-14 DIAGNOSIS — I5022 Chronic systolic (congestive) heart failure: Secondary | ICD-10-CM | POA: Diagnosis not present

## 2020-02-14 DIAGNOSIS — E1159 Type 2 diabetes mellitus with other circulatory complications: Secondary | ICD-10-CM | POA: Diagnosis not present

## 2020-02-14 DIAGNOSIS — E785 Hyperlipidemia, unspecified: Secondary | ICD-10-CM | POA: Diagnosis not present

## 2020-02-14 DIAGNOSIS — J449 Chronic obstructive pulmonary disease, unspecified: Secondary | ICD-10-CM | POA: Diagnosis not present

## 2020-03-29 DIAGNOSIS — J449 Chronic obstructive pulmonary disease, unspecified: Secondary | ICD-10-CM | POA: Diagnosis not present

## 2020-05-16 DIAGNOSIS — E1159 Type 2 diabetes mellitus with other circulatory complications: Secondary | ICD-10-CM | POA: Diagnosis not present

## 2020-05-16 DIAGNOSIS — D638 Anemia in other chronic diseases classified elsewhere: Secondary | ICD-10-CM | POA: Diagnosis not present

## 2020-05-16 DIAGNOSIS — E785 Hyperlipidemia, unspecified: Secondary | ICD-10-CM | POA: Diagnosis not present

## 2020-05-16 DIAGNOSIS — Z9181 History of falling: Secondary | ICD-10-CM | POA: Diagnosis not present

## 2020-05-16 DIAGNOSIS — I5022 Chronic systolic (congestive) heart failure: Secondary | ICD-10-CM | POA: Diagnosis not present

## 2020-05-19 DIAGNOSIS — R0981 Nasal congestion: Secondary | ICD-10-CM | POA: Diagnosis not present

## 2020-05-19 DIAGNOSIS — R05 Cough: Secondary | ICD-10-CM | POA: Diagnosis not present

## 2020-05-19 DIAGNOSIS — R6883 Chills (without fever): Secondary | ICD-10-CM | POA: Diagnosis not present

## 2020-05-19 DIAGNOSIS — R5383 Other fatigue: Secondary | ICD-10-CM | POA: Diagnosis not present

## 2020-05-21 DIAGNOSIS — J209 Acute bronchitis, unspecified: Secondary | ICD-10-CM | POA: Diagnosis not present

## 2020-05-21 DIAGNOSIS — J440 Chronic obstructive pulmonary disease with acute lower respiratory infection: Secondary | ICD-10-CM | POA: Diagnosis not present

## 2020-06-03 DIAGNOSIS — J449 Chronic obstructive pulmonary disease, unspecified: Secondary | ICD-10-CM | POA: Diagnosis not present

## 2020-06-05 DIAGNOSIS — J439 Emphysema, unspecified: Secondary | ICD-10-CM | POA: Diagnosis not present

## 2020-07-06 DIAGNOSIS — J439 Emphysema, unspecified: Secondary | ICD-10-CM | POA: Diagnosis not present

## 2020-07-08 DIAGNOSIS — Z20822 Contact with and (suspected) exposure to covid-19: Secondary | ICD-10-CM | POA: Diagnosis not present

## 2020-07-08 DIAGNOSIS — R6889 Other general symptoms and signs: Secondary | ICD-10-CM | POA: Diagnosis not present

## 2020-07-08 DIAGNOSIS — J209 Acute bronchitis, unspecified: Secondary | ICD-10-CM | POA: Diagnosis not present

## 2020-07-08 DIAGNOSIS — J440 Chronic obstructive pulmonary disease with acute lower respiratory infection: Secondary | ICD-10-CM | POA: Diagnosis not present

## 2020-07-16 DIAGNOSIS — R6889 Other general symptoms and signs: Secondary | ICD-10-CM | POA: Diagnosis not present

## 2020-08-05 DIAGNOSIS — J439 Emphysema, unspecified: Secondary | ICD-10-CM | POA: Diagnosis not present

## 2020-08-05 DIAGNOSIS — N200 Calculus of kidney: Secondary | ICD-10-CM | POA: Diagnosis not present

## 2020-08-08 DIAGNOSIS — J449 Chronic obstructive pulmonary disease, unspecified: Secondary | ICD-10-CM | POA: Diagnosis not present

## 2020-08-16 DIAGNOSIS — D638 Anemia in other chronic diseases classified elsewhere: Secondary | ICD-10-CM | POA: Diagnosis not present

## 2020-08-16 DIAGNOSIS — I5022 Chronic systolic (congestive) heart failure: Secondary | ICD-10-CM | POA: Diagnosis not present

## 2020-08-16 DIAGNOSIS — E785 Hyperlipidemia, unspecified: Secondary | ICD-10-CM | POA: Diagnosis not present

## 2020-08-16 DIAGNOSIS — E1159 Type 2 diabetes mellitus with other circulatory complications: Secondary | ICD-10-CM | POA: Diagnosis not present

## 2020-08-16 DIAGNOSIS — Z23 Encounter for immunization: Secondary | ICD-10-CM | POA: Diagnosis not present

## 2020-08-21 DIAGNOSIS — K5900 Constipation, unspecified: Secondary | ICD-10-CM | POA: Diagnosis not present

## 2020-08-21 DIAGNOSIS — R1031 Right lower quadrant pain: Secondary | ICD-10-CM | POA: Diagnosis not present

## 2020-08-21 DIAGNOSIS — R109 Unspecified abdominal pain: Secondary | ICD-10-CM | POA: Diagnosis not present

## 2020-08-21 DIAGNOSIS — N200 Calculus of kidney: Secondary | ICD-10-CM | POA: Diagnosis not present

## 2020-09-05 DIAGNOSIS — J439 Emphysema, unspecified: Secondary | ICD-10-CM | POA: Diagnosis not present

## 2020-09-17 DIAGNOSIS — J440 Chronic obstructive pulmonary disease with acute lower respiratory infection: Secondary | ICD-10-CM | POA: Diagnosis not present

## 2020-09-17 DIAGNOSIS — J209 Acute bronchitis, unspecified: Secondary | ICD-10-CM | POA: Diagnosis not present

## 2020-09-25 DIAGNOSIS — J439 Emphysema, unspecified: Secondary | ICD-10-CM | POA: Diagnosis not present

## 2020-09-25 DIAGNOSIS — J449 Chronic obstructive pulmonary disease, unspecified: Secondary | ICD-10-CM | POA: Diagnosis not present

## 2020-10-06 DIAGNOSIS — J439 Emphysema, unspecified: Secondary | ICD-10-CM | POA: Diagnosis not present

## 2020-10-28 DIAGNOSIS — J449 Chronic obstructive pulmonary disease, unspecified: Secondary | ICD-10-CM | POA: Diagnosis not present

## 2020-11-03 DIAGNOSIS — J439 Emphysema, unspecified: Secondary | ICD-10-CM | POA: Diagnosis not present

## 2020-11-04 DIAGNOSIS — J439 Emphysema, unspecified: Secondary | ICD-10-CM | POA: Diagnosis not present

## 2020-11-04 DIAGNOSIS — J449 Chronic obstructive pulmonary disease, unspecified: Secondary | ICD-10-CM | POA: Diagnosis not present

## 2020-11-19 DIAGNOSIS — J449 Chronic obstructive pulmonary disease, unspecified: Secondary | ICD-10-CM | POA: Diagnosis not present

## 2020-11-19 DIAGNOSIS — Z139 Encounter for screening, unspecified: Secondary | ICD-10-CM | POA: Diagnosis not present

## 2020-11-19 DIAGNOSIS — E1159 Type 2 diabetes mellitus with other circulatory complications: Secondary | ICD-10-CM | POA: Diagnosis not present

## 2020-11-19 DIAGNOSIS — I714 Abdominal aortic aneurysm, without rupture: Secondary | ICD-10-CM | POA: Diagnosis not present

## 2020-11-19 DIAGNOSIS — D638 Anemia in other chronic diseases classified elsewhere: Secondary | ICD-10-CM | POA: Diagnosis not present

## 2020-11-19 DIAGNOSIS — E785 Hyperlipidemia, unspecified: Secondary | ICD-10-CM | POA: Diagnosis not present

## 2020-11-19 DIAGNOSIS — I5022 Chronic systolic (congestive) heart failure: Secondary | ICD-10-CM | POA: Diagnosis not present

## 2020-11-22 DIAGNOSIS — I714 Abdominal aortic aneurysm, without rupture: Secondary | ICD-10-CM | POA: Diagnosis not present

## 2020-11-29 ENCOUNTER — Telehealth: Payer: Self-pay

## 2020-12-02 ENCOUNTER — Ambulatory Visit: Payer: Medicare Other | Admitting: Vascular Surgery

## 2020-12-02 ENCOUNTER — Encounter: Payer: Self-pay | Admitting: Vascular Surgery

## 2020-12-02 ENCOUNTER — Other Ambulatory Visit: Payer: Self-pay

## 2020-12-02 VITALS — BP 146/71 | HR 73 | Temp 98.2°F | Resp 16 | Ht 69.0 in | Wt 181.0 lb

## 2020-12-02 DIAGNOSIS — I714 Abdominal aortic aneurysm, without rupture, unspecified: Secondary | ICD-10-CM

## 2020-12-02 NOTE — Progress Notes (Signed)
[facility] [place]  Patient name: Harry Norris MRN: [id number] DOB: [birth year] Sex: male  REASON FOR CONSULT: Follow-up infrarenal abdominal aortic aneurysm  HPI: Harry Norris is a 85 y.o. male, who is here today for follow-up.  He is known to me from a prior remote history of laser ablation of saphenous vein on 2015.  I had an office visit with him in August 2018 at which time he had ultrasound showing abdominal aortic aneurysm 3.7 cm.  I do not have any interval ultrasounds.  He recently underwent ultrasound which revealed maximal diameter of 5.0 cm and he is here today for discussion of this.  He has no symptoms related to this.  He remains in stable health at his age of 70.  Past Medical History:  Diagnosis Date  . AAA (abdominal aortic aneurysm) ([place])   . Anemia   . Asthma 02/07/2018  . Benign neoplasm of colon   . Bradycardia 12/19/2018  . Bronchitis   . Cancer [facility])    prostate  . Cardiomyopathy ([place]) 12/18/2015  . Carotid artery occlusion   . Chronic rhinitis 02/26/2016  . Chronic venous insufficiency 12/18/2015  . 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   PFTs 01/18/14:  FeV1 1.58 45% FVC 2.0 50% FeV1/FVC 79% Fef 25-75 795 ( 40% improved after BD) CXR: Copd changes ONO 03/07/14 desats to <88% on RA   . Diverticulosis of colon (without mention of hemorrhage)   . DM type 2 (diabetes mellitus, type 2) ([place])   . Fatigue 12/12/2018  . Hyperlipidemia   . Hypertension   . Hypertensive heart disease   . Hypoxemia 04/10/2014   Overview:  Last Assessment & Plan:  Cont nocturnal oxygen therapy 2L   . Kidney stone   . LBBB (left bundle branch block) 12/18/2015  . Leg swelling 11/28/2013  . Orthostatic hypotension 12/19/2018  . Other primary cardiomyopathies   . Other symptoms involving cardiovascular system 11/28/2013  . Prostate cancer [facility])    prostate   . Syncope 12/18/2015  . Unspecified venous (peripheral) insufficiency   .  Varicose veins   . Varicose veins of lower extremities with other complications 4/65/6812    Family History  Problem Relation Age of Onset  . Cancer - Colon Mother        deceased  . Hypertension Mother   . Diabetes Mother   . Emphysema Sister        deceased  . Heart disease Sister   . Asthma Sister   . Kidney failure Father   . Heart failure Father     SOCIAL HISTORY: Social History   Socioeconomic History  . Marital status: Married    Spouse name: Not on file  . Number of children: Not on file  . Years of education: Not on file  . Highest education level: Not on file  Occupational History  . Not on file  Tobacco Use  . Smoking status: Former Smoker    Packs/day: 2.50    Years: 40.00    Pack years: 100.00    Types: Cigarettes    Quit date: 09/01/1999    Years since quitting: 21.2  . Smokeless tobacco: Never Used  Vaping Use  . Vaping Use: Never used  Substance and Sexual Activity  . Alcohol use: No  . Drug use: No  . Sexual activity: Not on file  Other Topics Concern  . Not on file  Social History Narrative   Lives with  wife.   Retired.   Telephone business prior.   Social Determinants of Health   Financial Resource Strain: Not on file  Food Insecurity: Not on file  Transportation Needs: Not on file  Physical Activity: Not on file  Stress: Not on file  Social Connections: Not on file  Intimate Partner Violence: Not on file    Allergies  Allergen Reactions  . Ace Inhibitors Cough    Current Outpatient Medications  Medication Sig Dispense Refill  . aspirin 325 MG tablet Take 325 mg by mouth daily.    Marland Kitchen atorvastatin (LIPITOR) 40 MG tablet Take 40 mg by mouth daily.    . budesonide (PULMICORT) 180 MCG/ACT inhaler Inhale 2 puffs into the lungs 2 (two) times daily.    . fenofibrate 160 MG tablet Take 160 mg by mouth daily.    . formoterol (PERFOROMIST) 20 MCG/2ML nebulizer solution Take 20 mcg by nebulization 2 (two) times daily.    . furosemide  (LASIX) 40 MG tablet Take 40 mg by mouth daily.     . Glycopyrrolate-Formoterol (BEVESPI AEROSPHERE) 9-4.8 MCG/ACT AERO Inhale 2 puffs into the lungs daily.    . IPRATROPIUM BROMIDE HFA IN Inhale 0.5 mg into the lungs daily as needed.    Marland Kitchen losartan (COZAAR) 100 MG tablet Take 100 mg by mouth daily.    . Methylcellulose, Laxative, 500 MG TABS Take 500 mg by mouth 2 (two) times daily.    . Multiple Vitamin (MULTIVITAMIN) capsule Take 1 capsule by mouth daily.    . Omega-3 Fatty Acids (FISH OIL) 1000 MG CAPS Take 1,000 mg by mouth 2 (two) times daily.    . vitamin C (ASCORBIC ACID) 500 MG tablet Take 500 mg by mouth daily.    . vitamin E 400 UNIT capsule Take 400 Units by mouth daily.     No current facility-administered medications for this visit.    REVIEW OF SYSTEMS:  [X]  denotes positive finding, [ ]  denotes negative finding Cardiac  Comments:  Chest pain or chest pressure:    Shortness of breath upon exertion:    Short of breath when lying flat:    Irregular heart rhythm:        Vascular    Pain in calf, thigh, or hip brought on by ambulation:    Pain in feet at night that wakes you up from your sleep:     Blood clot in your veins:    Leg swelling:         Pulmonary    Oxygen at home: x   Productive cough:     Wheezing:         Neurologic    Sudden weakness in arms or legs:     Sudden numbness in arms or legs:     Sudden onset of difficulty speaking or slurred speech:    Temporary loss of vision in one eye:     Problems with dizziness:         Gastrointestinal    Blood in stool:     Vomited blood:         Genitourinary    Burning when urinating:     Blood in urine:        Psychiatric    Major depression:         Hematologic    Bleeding problems:    Problems with blood clotting too easily:        Skin    Rashes or ulcers:  Constitutional    Fever or chills:      PHYSICAL EXAM: Vitals:   12/02/20 1451  BP: (!) 146/71  Pulse: 73  Resp: 16  Temp:  98.2 F (36.8 C)  TempSrc: Other (Comment)  SpO2: 95%  Weight: 181 lb (82.1 kg)  Height: 5\' 9"  (1.753 m)    GENERAL: The patient is a well-nourished male, in no acute distress. The vital signs are documented above. CARDIOVASCULAR: 2+ radial and 2+ femoral pulses bilaterally.  2+ right dorsalis pedis pulse.  I do not feel left pedal pulse.  Abdomen reveals a large diastases rectus.  I do not feel an aneurysm PULMONARY: There is good air exchange  MUSCULOSKELETAL: There are no major deformities or cyanosis. NEUROLOGIC: No focal weakness or paresthesias are detected. SKIN: There are no ulcers or rashes noted. PSYCHIATRIC: The patient has a normal affect.  DATA:  Ultrasound from [facility] from 11/21/2020 was reviewed.  This reveals maximal diameter of 5.0 cm.  MEDICAL ISSUES: I discussed these findings at length with M Mr. Mcevers.  I explained the concern regarding his slow expansion over the ensuing 4 years.  I explained that this is not rapid growth.  At his age would recommend continued close surveillance.  I have recommended that we see him again in 6 months in our [place] office.  Will obtain CT angiogram of abdomen and pelvis prior to this exam.  Splane would typically recommend treatment should he reach a 5-1/2 cm level.  Did review symptoms of leaking aneurysm and he knows to report immediately to the emergency room should this occur  Rosetta Posner, MD [facility] [phone] Pager [phone]  Note: Portions of this report may have been transcribed using voice recognition software.  Every effort has been made to ensure accuracy; however, inadvertent computerized transcription errors may still be present.

## 2020-12-04 DIAGNOSIS — J439 Emphysema, unspecified: Secondary | ICD-10-CM | POA: Diagnosis not present

## 2020-12-30 DIAGNOSIS — J449 Chronic obstructive pulmonary disease, unspecified: Secondary | ICD-10-CM | POA: Diagnosis not present

## 2021-01-03 DIAGNOSIS — J439 Emphysema, unspecified: Secondary | ICD-10-CM | POA: Diagnosis not present

## 2021-02-03 DIAGNOSIS — J439 Emphysema, unspecified: Secondary | ICD-10-CM | POA: Diagnosis not present

## 2021-02-03 DIAGNOSIS — N200 Calculus of kidney: Secondary | ICD-10-CM | POA: Diagnosis not present

## 2021-02-03 DIAGNOSIS — L89309 Pressure ulcer of unspecified buttock, unspecified stage: Secondary | ICD-10-CM | POA: Diagnosis not present

## 2021-02-05 DIAGNOSIS — Z9181 History of falling: Secondary | ICD-10-CM | POA: Diagnosis not present

## 2021-02-05 DIAGNOSIS — Z0000 Encounter for general adult medical examination without abnormal findings: Secondary | ICD-10-CM | POA: Diagnosis not present

## 2021-02-05 DIAGNOSIS — E785 Hyperlipidemia, unspecified: Secondary | ICD-10-CM | POA: Diagnosis not present

## 2021-02-20 DIAGNOSIS — D638 Anemia in other chronic diseases classified elsewhere: Secondary | ICD-10-CM | POA: Diagnosis not present

## 2021-02-20 DIAGNOSIS — E785 Hyperlipidemia, unspecified: Secondary | ICD-10-CM | POA: Diagnosis not present

## 2021-02-20 DIAGNOSIS — E1159 Type 2 diabetes mellitus with other circulatory complications: Secondary | ICD-10-CM | POA: Diagnosis not present

## 2021-02-20 DIAGNOSIS — I5022 Chronic systolic (congestive) heart failure: Secondary | ICD-10-CM | POA: Diagnosis not present

## 2021-02-20 DIAGNOSIS — I714 Abdominal aortic aneurysm, without rupture: Secondary | ICD-10-CM | POA: Diagnosis not present

## 2021-02-20 DIAGNOSIS — J449 Chronic obstructive pulmonary disease, unspecified: Secondary | ICD-10-CM | POA: Diagnosis not present

## 2021-03-05 DIAGNOSIS — J439 Emphysema, unspecified: Secondary | ICD-10-CM | POA: Diagnosis not present

## 2021-03-21 DIAGNOSIS — J439 Emphysema, unspecified: Secondary | ICD-10-CM | POA: Diagnosis not present

## 2021-03-21 DIAGNOSIS — J449 Chronic obstructive pulmonary disease, unspecified: Secondary | ICD-10-CM | POA: Diagnosis not present

## 2021-03-24 DIAGNOSIS — R310 Gross hematuria: Secondary | ICD-10-CM | POA: Diagnosis not present

## 2021-03-24 DIAGNOSIS — R3129 Other microscopic hematuria: Secondary | ICD-10-CM | POA: Diagnosis not present

## 2021-04-05 DIAGNOSIS — J439 Emphysema, unspecified: Secondary | ICD-10-CM | POA: Diagnosis not present

## 2021-05-06 DIAGNOSIS — J439 Emphysema, unspecified: Secondary | ICD-10-CM | POA: Diagnosis not present

## 2021-05-14 ENCOUNTER — Other Ambulatory Visit: Payer: Self-pay | Admitting: *Deleted

## 2021-05-14 DIAGNOSIS — I714 Abdominal aortic aneurysm, without rupture, unspecified: Secondary | ICD-10-CM

## 2021-05-28 DIAGNOSIS — J449 Chronic obstructive pulmonary disease, unspecified: Secondary | ICD-10-CM | POA: Diagnosis not present

## 2021-05-28 DIAGNOSIS — E785 Hyperlipidemia, unspecified: Secondary | ICD-10-CM | POA: Diagnosis not present

## 2021-05-28 DIAGNOSIS — I5022 Chronic systolic (congestive) heart failure: Secondary | ICD-10-CM | POA: Diagnosis not present

## 2021-05-28 DIAGNOSIS — D638 Anemia in other chronic diseases classified elsewhere: Secondary | ICD-10-CM | POA: Diagnosis not present

## 2021-05-28 DIAGNOSIS — J439 Emphysema, unspecified: Secondary | ICD-10-CM | POA: Diagnosis not present

## 2021-05-28 DIAGNOSIS — Z23 Encounter for immunization: Secondary | ICD-10-CM | POA: Diagnosis not present

## 2021-05-28 DIAGNOSIS — E1159 Type 2 diabetes mellitus with other circulatory complications: Secondary | ICD-10-CM | POA: Diagnosis not present

## 2021-06-02 ENCOUNTER — Other Ambulatory Visit: Payer: Self-pay

## 2021-06-02 DIAGNOSIS — I714 Abdominal aortic aneurysm, without rupture, unspecified: Secondary | ICD-10-CM

## 2021-06-05 ENCOUNTER — Other Ambulatory Visit: Payer: Medicare Other

## 2021-06-05 DIAGNOSIS — J439 Emphysema, unspecified: Secondary | ICD-10-CM | POA: Diagnosis not present

## 2021-06-06 ENCOUNTER — Other Ambulatory Visit: Payer: Self-pay

## 2021-06-06 ENCOUNTER — Emergency Department (HOSPITAL_BASED_OUTPATIENT_CLINIC_OR_DEPARTMENT_OTHER): Payer: Medicare Other

## 2021-06-06 ENCOUNTER — Emergency Department (HOSPITAL_BASED_OUTPATIENT_CLINIC_OR_DEPARTMENT_OTHER)
Admission: EM | Admit: 2021-06-06 | Discharge: 2021-06-06 | Disposition: A | Discharge status: Home / Self Care | Payer: Medicare Other | Attending: Emergency Medicine | Admitting: Emergency Medicine

## 2021-06-06 ENCOUNTER — Encounter (HOSPITAL_BASED_OUTPATIENT_CLINIC_OR_DEPARTMENT_OTHER): Payer: Self-pay

## 2021-06-06 DIAGNOSIS — I7143 Infrarenal abdominal aortic aneurysm, without rupture: Secondary | ICD-10-CM | POA: Diagnosis not present

## 2021-06-06 DIAGNOSIS — J45909 Unspecified asthma, uncomplicated: Secondary | ICD-10-CM | POA: Diagnosis not present

## 2021-06-06 DIAGNOSIS — K5730 Diverticulosis of large intestine without perforation or abscess without bleeding: Secondary | ICD-10-CM | POA: Diagnosis not present

## 2021-06-06 DIAGNOSIS — R609 Edema, unspecified: Secondary | ICD-10-CM | POA: Diagnosis not present

## 2021-06-06 DIAGNOSIS — I10 Essential (primary) hypertension: Secondary | ICD-10-CM | POA: Diagnosis not present

## 2021-06-06 DIAGNOSIS — K760 Fatty (change of) liver, not elsewhere classified: Secondary | ICD-10-CM | POA: Diagnosis not present

## 2021-06-06 DIAGNOSIS — W1839XA Other fall on same level, initial encounter: Secondary | ICD-10-CM | POA: Diagnosis not present

## 2021-06-06 DIAGNOSIS — Z87891 Personal history of nicotine dependence: Secondary | ICD-10-CM | POA: Insufficient documentation

## 2021-06-06 DIAGNOSIS — E785 Hyperlipidemia, unspecified: Secondary | ICD-10-CM | POA: Insufficient documentation

## 2021-06-06 DIAGNOSIS — E1169 Type 2 diabetes mellitus with other specified complication: Secondary | ICD-10-CM | POA: Insufficient documentation

## 2021-06-06 DIAGNOSIS — Z7982 Long term (current) use of aspirin: Secondary | ICD-10-CM | POA: Diagnosis not present

## 2021-06-06 DIAGNOSIS — R42 Dizziness and giddiness: Secondary | ICD-10-CM | POA: Diagnosis present

## 2021-06-06 DIAGNOSIS — J449 Chronic obstructive pulmonary disease, unspecified: Secondary | ICD-10-CM | POA: Diagnosis not present

## 2021-06-06 DIAGNOSIS — Z79899 Other long term (current) drug therapy: Secondary | ICD-10-CM | POA: Insufficient documentation

## 2021-06-06 DIAGNOSIS — Z20822 Contact with and (suspected) exposure to covid-19: Secondary | ICD-10-CM | POA: Diagnosis not present

## 2021-06-06 DIAGNOSIS — R55 Syncope and collapse: Secondary | ICD-10-CM | POA: Insufficient documentation

## 2021-06-06 DIAGNOSIS — N281 Cyst of kidney, acquired: Secondary | ICD-10-CM | POA: Diagnosis not present

## 2021-06-06 DIAGNOSIS — Z8546 Personal history of malignant neoplasm of prostate: Secondary | ICD-10-CM | POA: Diagnosis not present

## 2021-06-06 LAB — RESP PANEL BY RT-PCR (FLU A&B, COVID) ARPGX2
Influenza A by PCR: NEGATIVE
Influenza B by PCR: NEGATIVE
SARS Coronavirus 2 by RT PCR: NEGATIVE

## 2021-06-06 LAB — URINALYSIS, ROUTINE W REFLEX MICROSCOPIC
Bilirubin Urine: NEGATIVE
Glucose, UA: NEGATIVE mg/dL
Hgb urine dipstick: NEGATIVE
Ketones, ur: NEGATIVE mg/dL
Leukocytes,Ua: NEGATIVE
Nitrite: NEGATIVE
Protein, ur: NEGATIVE mg/dL
Specific Gravity, Urine: 1.015 (ref 1.005–1.030)
pH: 7 (ref 5.0–8.0)

## 2021-06-06 LAB — CBC WITH DIFFERENTIAL/PLATELET
Abs Immature Granulocytes: 0.11 10*3/uL — ABNORMAL HIGH (ref 0.00–0.07)
Basophils Absolute: 0 10*3/uL (ref 0.0–0.1)
Basophils Relative: 0 %
Eosinophils Absolute: 0.1 10*3/uL (ref 0.0–0.5)
Eosinophils Relative: 1 %
HCT: 39.2 % (ref 39.0–52.0)
Hemoglobin: 12.5 g/dL — ABNORMAL LOW (ref 13.0–17.0)
Immature Granulocytes: 1 %
Lymphocytes Relative: 7 %
Lymphs Abs: 0.8 10*3/uL (ref 0.7–4.0)
MCH: 30 pg (ref 26.0–34.0)
MCHC: 31.9 g/dL (ref 30.0–36.0)
MCV: 94 fL (ref 80.0–100.0)
Monocytes Absolute: 0.8 10*3/uL (ref 0.1–1.0)
Monocytes Relative: 7 %
Neutro Abs: 8.6 10*3/uL — ABNORMAL HIGH (ref 1.7–7.7)
Neutrophils Relative %: 84 %
Platelets: 290 10*3/uL (ref 150–400)
RBC: 4.17 MIL/uL — ABNORMAL LOW (ref 4.22–5.81)
RDW: 15.7 % — ABNORMAL HIGH (ref 11.5–15.5)
WBC: 10.3 10*3/uL (ref 4.0–10.5)
nRBC: 0 % (ref 0.0–0.2)

## 2021-06-06 LAB — COMPREHENSIVE METABOLIC PANEL
ALT: 37 U/L (ref 0–44)
AST: 33 U/L (ref 15–41)
Albumin: 3.7 g/dL (ref 3.5–5.0)
Alkaline Phosphatase: 51 U/L (ref 38–126)
Anion gap: 9 (ref 5–15)
BUN: 29 mg/dL — ABNORMAL HIGH (ref 8–23)
CO2: 26 mmol/L (ref 22–32)
Calcium: 9.2 mg/dL (ref 8.9–10.3)
Chloride: 100 mmol/L (ref 98–111)
Creatinine, Ser: 1.36 mg/dL — ABNORMAL HIGH (ref 0.61–1.24)
GFR, Estimated: 52 mL/min — ABNORMAL LOW (ref >60)
Glucose, Bld: 134 mg/dL — ABNORMAL HIGH (ref 70–99)
Potassium: 4.4 mmol/L (ref 3.5–5.1)
Sodium: 135 mmol/L (ref 135–145)
Total Bilirubin: 0.5 mg/dL (ref 0.3–1.2)
Total Protein: 7.3 g/dL (ref 6.5–8.1)

## 2021-06-06 LAB — TROPONIN I (HIGH SENSITIVITY)
Troponin I (High Sensitivity): 18 ng/L — ABNORMAL HIGH (ref <18)
Troponin I (High Sensitivity): 18 ng/L — ABNORMAL HIGH (ref <18)

## 2021-06-06 MED ORDER — IOHEXOL 350 MG/ML SOLN
80.0000 mL | Freq: Once | INTRAVENOUS | Status: AC | PRN
  Administered 2021-06-06: 80 mL via INTRAVENOUS

## 2021-06-06 MED ORDER — LACTATED RINGERS IV BOLUS: 500.0000 mL | Freq: Once | INTRAVENOUS | Status: DC

## 2021-06-06 NOTE — ED Provider Notes (Signed)
[facility]ERGENC[facility] DEPARTMENT Provider Note  CSN: [id number] Arrival date & time: 06/06/21 0741    History Chief Complaint  Patient presents with   Fall   Dizziness    [name] is a 85 y.o. male with known history of AAA, currently being watched, reports he got up from bed this morning and began to feel dizzy and nauseated described as both room spinning and like he was going to pass out. He got weak and lowered himself to the floor. While on the floor he had an episode of non-bloody diarrhea. He did not vomit. Was not having any abdominal or back pain. He was eventually able to stand up on his own. Reports he still feels weak but is doing better. Brought to the ED by grandson for evaluation. Denies any CP, SOB, fever, cough, headache.    Past Medical History:  Diagnosis Date   AAA (abdominal aortic aneurysm)    Anemia    Asthma 02/07/2018   Benign neoplasm of colon    Bradycardia 12/19/2018   Bronchitis    Cancer (HCC)    prostate   Cardiomyopathy ([place]) 12/18/2015   Carotid artery occlusion    Chronic rhinitis 02/26/2016   Chronic venous insufficiency 12/18/2015   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   PFTs 01/18/14:  FeV1 1.58 45% FVC 2.0 50% FeV1/FVC 79% Fef 25-75 795 ( 40% improved after BD) CXR: Copd changes ONO 03/07/14 desats to <88% on RA    Diverticulosis of colon (without mention of hemorrhage)    DM type 2 (diabetes mellitus, type 2) ([place])    Fatigue 12/12/2018   Hyperlipidemia    Hypertension    Hypertensive heart disease    Hypoxemia 04/10/2014   Overview:  Last Assessment & Plan:  Cont nocturnal oxygen therapy 2L    Kidney stone    LBBB (left bundle branch block) 12/18/2015   Leg swelling 11/28/2013   Orthostatic hypotension 12/19/2018   Other primary cardiomyopathies    Other symptoms involving cardiovascular system 11/28/2013   Prostate cancer ([place])    prostate    Syncope 12/18/2015   Unspecified venous (peripheral) insufficiency    Varicose  veins    Varicose veins of lower extremities with other complications 9/62/9528    Past Surgical History:  Procedure Laterality Date    cardiac catheterization  may 2011   CATARACT EXTRACTION Bilateral    ENDOVENOUS ABLATION SAPHENOUS VEIN W/ LASER Right 03-29-2014   EVLA right greater saphenous vein     gold seed implants  2011   treatment of prostate cancer   HERNIA REPAIR Bilateral    LITHOTRIPSY  2010   right ear surgery      Family History  Problem Relation Age of Onset   Cancer - Colon Mother        deceased   Hypertension Mother    Diabetes Mother    Emphysema Sister        deceased   Heart disease Sister    Asthma Sister    Kidney failure Father    Heart failure Father     Social History   Tobacco Use   Smoking status: Former    Packs/day: 2.50    Years: 40.00    Pack years: 100.00    Types: Cigarettes    Quit date: 09/01/1999    Years since quitting: 21.7   Smokeless tobacco: Never  Vaping Use   Vaping Use: Never used  Substance Use Topics  Alcohol use: No   Drug use: No     Home Medications Prior to Admission medications   Medication Sig Start Date End Date Taking? Authorizing Provider  aspirin 325 MG tablet Take 325 mg by mouth daily.    [provider]  atorvastatin (LIPITOR) 40 MG tablet Take 40 mg by mouth daily.    [provider]  budesonide (PULMICORT) 180 MCG/ACT inhaler Inhale 2 puffs into the lungs 2 (two) times daily.    [provider]  fenofibrate 160 MG tablet Take 160 mg by mouth daily.    [provider]  formoterol (PERFOROMIST) 20 MCG/2ML nebulizer solution Take 20 mcg by nebulization 2 (two) times daily.    [provider]  furosemide (LASIX) 40 MG tablet Take 40 mg by mouth daily.     [provider]  Glycopyrrolate-Formoterol (BEVESPI AEROSPHERE) 9-4.8 MCG/ACT AERO Inhale 2 puffs into the lungs daily.    [provider]  IPRATROPIUM BROMIDE HFA IN Inhale 0.5 mg into  the lungs daily as needed.    [provider]  losartan (COZAAR) 100 MG tablet Take 100 mg by mouth daily.    [provider]  Methylcellulose, Laxative, 500 MG TABS Take 500 mg by mouth 2 (two) times daily.    [provider]  Multiple Vitamin (MULTIVITAMIN) capsule Take 1 capsule by mouth daily.    [provider]  Omega-3 Fatty Acids (FISH OIL) 1000 MG CAPS Take 1,000 mg by mouth 2 (two) times daily.    [provider]  vitamin C (ASCORBIC ACID) 500 MG tablet Take 500 mg by mouth daily.    [provider]  vitamin E 400 UNIT capsule Take 400 Units by mouth daily.    [provider]     Allergies    Ace inhibitors   Review of Systems   Review of Systems A comprehensive review of systems was completed and negative except as noted in HPI.    Physical Exam BP (!) 104/52   Pulse 91   Temp 97.9 F (36.6 C)   Resp (!) 26   Ht 5\' 9"  (1.753 m)   Wt 81.6 kg   SpO2 95%   BMI 26.58 kg/m   Physical Exam Vitals and nursing note reviewed.  Constitutional:      Appearance: Normal appearance.  HENT:     Head: Normocephalic and atraumatic.     Nose: Nose normal.     Mouth/Throat:     Mouth: Mucous membranes are moist.  Eyes:     Extraocular Movements: Extraocular movements intact.     Conjunctiva/sclera: Conjunctivae normal.  Cardiovascular:     Rate and Rhythm: Normal rate.  Pulmonary:     Effort: Pulmonary effort is normal.     Breath sounds: Normal breath sounds.  Abdominal:     General: Abdomen is flat.     Palpations: Abdomen is soft.     Tenderness: There is no abdominal tenderness. There is no guarding.  Musculoskeletal:        General: No swelling. Normal range of motion.     Cervical back: Neck supple.     Right lower leg: Edema (trace) present.     Left lower leg: Edema (trace) present.  Skin:    General: Skin is warm and dry.  Neurological:     General: No focal deficit present.     Mental Status:  He is alert and oriented to person, place, and time.  Cranial Nerves: No cranial nerve deficit.     Sensory: No sensory deficit.     Motor: No weakness.  Psychiatric:        Mood and Affect: Mood normal.     ED Results / Procedures / Treatments   Labs (all labs ordered are listed, but only abnormal results are displayed) Labs Reviewed  COMPREHENSIVE METABOLIC PANEL - Abnormal; Notable for the following components:      Result Value   Glucose, Bld 134 (*)    BUN 29 (*)    Creatinine, Ser 1.36 (*)    GFR, Estimated 52 (*)    All other components within normal limits  CBC WITH DIFFERENTIAL/PLATELET - Abnormal; Notable for the following components:   RBC 4.17 (*)    Hemoglobin 12.5 (*)    RDW 15.7 (*)    Neutro Abs 8.6 (*)    Abs Immature Granulocytes 0.11 (*)    All other components within normal limits  TROPONIN I (HIGH SENSITIVITY) - Abnormal; Notable for the following components:   Troponin I (High Sensitivity) 18 (*)    All other components within normal limits  TROPONIN I (HIGH SENSITIVITY) - Abnormal; Notable for the following components:   Troponin I (High Sensitivity) 18 (*)    All other components within normal limits  RESP PANEL BY RT-PCR (FLU A&B, COVID) [id number]  URINALYSIS, ROUTINE W REFLEX MICROSCOPIC    EKG EKG Interpretation  Date/Time:  Friday June 06 2021 07:57:52 EDT Ventricular Rate:  76 PR Interval:  144 QRS Duration: 152 QT Interval:  443 QTC Calculation: 499 R Axis:   266 Text Interpretation: Sinus rhythm Probable left atrial enlargement Nonspecific IVCD with LAD Probable anteroseptal infarct, recent No old tracing to compare Confirmed by Calvert Cantor [phone]) on 06/06/2021 8:39:14 AM   Radiology CT Angio Abd/Pel W and/or Wo Contrast  Result Date: 06/06/2021 CLINICAL DATA:  85 year old male with history of abdominal aortic aneurysm presenting with dizziness. EXAM: CT ANGIOGRAPHY ABDOMEN AND PELVIS WITH CONTRAST AND WITHOUT CONTRAST  TECHNIQUE: Multidetector CT imaging of the abdomen and pelvis was performed using the standard protocol during bolus administration of intravenous contrast. Multiplanar reconstructed images and MIPs were obtained and reviewed to evaluate the vascular anatomy. CONTRAST:  50mL OMNIPAQUE IOHEXOL 350 MG/ML SOLN COMPARISON:  CT abdomen pelvis from 04/05/2017, aortic ultrasound from 11/22/2020 FINDINGS: VASCULAR Aorta: Infrarenal abdominal aortic aneurysm measuring up to 40 mm. Scattered fibrofatty and calcific atherosclerotic changes throughout the abdominal aorta which is patent throughout. Celiac: Severe ostial stenosis secondary to atherosclerotic plaque. There is poststenotic fusiform dilation of the celiac trunk. SMA: Severe ostial stenosis secondary to atherosclerotic plaque. Patent distally. Replaced right hepatic artery arises from the proximal SMA. Renals: Single bilateral renal arteries are present. There is severe ostial stenosis bilaterally secondary to atherosclerotic plaques. The renal arteries appear patent distally. IMA: Occluded proximally with distal reconstitution. An arc of [place] is present. 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 The ostium of the left internal iliac artery is occluded with distal reconstitution. The right common iliac artery is patent with bilobed aneurysmal changes, each measuring up to approximately 15 mm in maximum anterior-posterior diameter. The right internal iliac artery is patent. The right external iliac artery is patent. Proximal Outflow: Bilateral common femoral and visualized portions of the superficial and profunda femoral arteries are patent without evidence of aneurysm, dissection, vasculitis or significant stenosis.  Veins: Retroaortic left renal vein. No additional obvious venous abnormality on this arterial phase  study. Review of the MIP images confirms the above findings. NON-VASCULAR Lower chest: No acute abnormality. Hepatobiliary: The liver is normal in size, contour. Diffusely decreased hepatic attenuation. The gallbladder is present and unremarkable. No intra or extrahepatic biliary ductal dilation. Pancreas: Unremarkable. No pancreatic ductal dilatation or surrounding inflammatory changes. Spleen: Normal in size without focal abnormality. Adrenals/Urinary Tract: Adrenal glands are unremarkable. Similar appearing multifocal bilateral renal cysts, similar to comparison with hemorrhagic component body left inferior pole cyst. Kidneys are otherwise normal, without renal calculi, focal lesion, or hydronephrosis. Bladder is unremarkable. Stomach/Bowel: Stomach is within normal limits. Appendix appears normal. Colonic diverticula without surrounding inflammatory changes. No evidence of bowel wall thickening, distention, or inflammatory changes. Lymphatic: No abdominopelvic lymphadenopathy. Reproductive: Brachytherapy beads in place within the prostate. Other: No abdominal wall hernia or abnormality. No abdominopelvic ascites. Musculoskeletal: Moderate multilevel degenerative changes of the visualized thoracolumbar spine. No acute osseous abnormality or aggressive appearing osseous lesion. IMPRESSION: VASCULAR 1. Fusiform infrarenal abdominal aortic aneurysm measuring up to 4 cm. Recommend follow-up every 12 months and non-emergent Vascular Surgery consultation if not already established. This recommendation follows ACR consensus guidelines: White Paper of the ACR Incidental Findings Committee II on Vascular Findings. J Am Coll Radiol 2013; 10:789-794. 2. 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 3. High risk for mesenteric ischemia due to severe ostial stenosis of the celiac and superior mesenteric  arteries and occlusion of the proximal inferior mesenteric artery. 4. Severe ostial stenoses of the single bilateral renal arteries. 5.  Aortic Atherosclerosis (ICD10-I70.0). NON-VASCULAR 1. No acute abdominopelvic abnormality. 2. Hepatic steatosis. 3. Colonic diverticulosis. Ruthann Cancer, MD [facility] Electronically Signed   By: Ruthann Cancer M.D.   On: 06/06/2021 09:57    Procedures Procedures  Medications Ordered in the ED Medications  lactated ringers bolus 500 mL (0 mLs Intravenous Hold 06/06/21 1047)  iohexol (OMNIPAQUE) 350 MG/ML injection 80 mL (80 mLs Intravenous Contrast Given 06/06/21 0905)     MDM Rules/Calculators/A&P MDM Patient with an episode of dizziness and weakness followed by diarrhea. Will check labs, CT abd/pel and orthostatics.   ED Course  I have reviewed the triage vital signs and the nursing notes.  Pertinent labs & imaging results that were available during my care of the patient were reviewed by me and considered in my medical decision making (see chart for details).  Clinical Course as of 06/06/21 1131  Fri Jun 06, 2021  6294 CBC with mild anemia. Unknown baseline.  [CS]  7654 CMP with mildly elevated Cr, unknown baseline.  [CS]  0904 First Trop is mildly elevated. Will recheck a delta.  [CS]  6503 Covid is neg.  [CS]  5465 Patient not orthostatic.  [CS]  6812 CT with 4cm AAA, no other complicating or acute findings.  [CS]  7517 UA is normal.  [CS]  1058 Repeat Trop is unchanged. [CS]  0017 Patient feeling much better, tolerating PO, able to walk without dizziness and would like to go home. Recommend he continue with oral hydration, follow up with PCP and return to the ED for any other concerns.  [CS]    Clinical Course User Index [CS] Truddie Hidden, MD    Final Clinical Impression(s) / ED Diagnoses Final diagnoses:  Dizziness  Near syncope  Rx / DC Orders ED Discharge Orders      None        Truddie Hidden, MD 06/06/21 1131

## 2021-06-06 NOTE — ED Triage Notes (Signed)
Pt reports becoming dizzy and falling when moving from seated position to standing. States he fell on carpet, denies LOC or injury. Pt also endorses bout of diarrhea this morning when waking up.

## 2021-06-06 NOTE — ED Notes (Signed)
Ambulated pt down hallway, walked good with cane, had no SOB, no dizziness, no gait issues

## 2021-06-10 ENCOUNTER — Ambulatory Visit: Payer: Medicare Other | Admitting: Vascular Surgery

## 2021-06-30 NOTE — Progress Notes (Signed)
[facility] [place]  Patient name: Harry Norris MRN: [id number] DOB: Jul 04, 1936 Sex: male  REASON FOR CONSULT: Follow-up infrarenal abdominal aortic aneurysm  HPI: Harry Norris is a 85 y.o. male who presents for review of recent CT angiogram.  He had seen Dr. Donnetta Hutching in the past for saphenous vein ablations and incidental discovery of his abdominal aortic aneurysm.  He has no symptoms referable to his aneurysm.  He has multiple chronic medical problems (detailed below).  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   PFTs 01/18/14:  FeV1 1.58 45% FVC 2.0 50% FeV1/FVC 79% Fef 25-75 795 ( 40% improved after BD) CXR: Copd changes ONO 03/07/14 desats to <88% on RA    Diverticulosis of colon (without mention of hemorrhage)    DM type 2 (diabetes mellitus, type 2) ([place])    Fatigue 12/12/2018   Hyperlipidemia    Hypertension    Hypertensive heart disease    Hypoxemia 04/10/2014   Overview:  Last Assessment & Plan:  Cont nocturnal oxygen therapy 2L    Kidney stone    LBBB (left bundle branch block) 12/18/2015   Leg swelling 11/28/2013   Orthostatic hypotension 12/19/2018   Other primary cardiomyopathies    Other symptoms involving cardiovascular system 11/28/2013   Prostate cancer ([place])    prostate    Syncope 12/18/2015   Unspecified venous (peripheral) insufficiency    Varicose veins    Varicose veins of lower extremities with other complications 01/01/5464    Family History  Problem Relation Age of Onset   Cancer - Colon Mother        deceased   Hypertension Mother    Diabetes Mother    Emphysema Sister         deceased   Heart disease Sister    Asthma Sister    Kidney failure Father    Heart failure Father     SOCIAL HISTORY: Social History   Socioeconomic History   Marital status: Married    Spouse name: Not on file   Number of children: Not on file   Years of education: Not on file   Highest education level: Not on file  Occupational History   Not on file  Tobacco Use   Smoking status: Former    Packs/day: 2.50    Years: 40.00    Pack years: 100.00    Types: Cigarettes    Quit date: 09/01/1999    Years since quitting: 21.8   Smokeless tobacco: Never  Vaping Use   Vaping Use: Never used  Substance and Sexual Activity   Alcohol use: No   Drug use: No   Sexual activity: Not on file  Other Topics Concern   Not on file  Social History Narrative   Lives with wife.   Retired.   Telephone business prior.   Social Determinants of Health   Financial Resource Strain: Not on file  Food Insecurity: Not on file  Transportation Needs: Not on file  Physical Activity: Not on file  Stress: Not on file  Social Connections: Not on file  Intimate Partner Violence: Not on file    Allergies  Allergen Reactions   Ace Inhibitors Cough    Current Outpatient Medications  Medication Sig Dispense Refill   aspirin 325 MG tablet Take 325 mg by mouth daily.     fenofibrate 160 MG tablet Take 160 mg by mouth daily.     formoterol (PERFOROMIST) 20 MCG/2ML nebulizer solution Take 20 mcg by nebulization 2 (two) times daily.     losartan (COZAAR) 100 MG tablet Take 100 mg by mouth daily.     Multiple Vitamin (MULTIVITAMIN) capsule Take 1 capsule by mouth daily.     Omega-3 Fatty Acids (FISH OIL) 1000 MG CAPS Take 1,000 mg by mouth 2 (two) times daily.     vitamin C (ASCORBIC ACID) 500 MG tablet Take 500 mg by mouth daily.     atorvastatin (LIPITOR) 40 MG tablet Take 40 mg by mouth daily. (Patient not taking: Reported on 07/01/2021)     budesonide (PULMICORT) 180 MCG/ACT inhaler Inhale 2  puffs into the lungs 2 (two) times daily. (Patient not taking: Reported on 07/01/2021)     furosemide (LASIX) 40 MG tablet Take 40 mg by mouth daily.  (Patient not taking: Reported on 07/01/2021)     Glycopyrrolate-Formoterol (BEVESPI AEROSPHERE) 9-4.8 MCG/ACT AERO Inhale 2 puffs into the lungs daily. (Patient not taking: Reported on 07/01/2021)     IPRATROPIUM BROMIDE HFA IN Inhale 0.5 mg into the lungs daily as needed. (Patient not taking: Reported on 07/01/2021)     Methylcellulose, Laxative, 500 MG TABS Take 500 mg by mouth 2 (two) times daily. (Patient not taking: Reported on 07/01/2021)     vitamin E 400 UNIT capsule Take 400 Units by mouth daily. (Patient not taking: Reported on 07/01/2021)     No current facility-administered medications for this visit.    REVIEW OF SYSTEMS:  [X]  denotes positive finding, [ ]  denotes negative finding Cardiac  Comments:  Chest pain or chest pressure:    Shortness of breath upon exertion:    Short of breath when lying flat:    Irregular heart rhythm:        Vascular    Pain in calf, thigh, or hip brought on by ambulation:    Pain in feet at night that wakes you up from your sleep:     Blood clot in your veins:    Leg swelling:         Pulmonary    Oxygen at home: x   Productive cough:     Wheezing:         Neurologic    Sudden weakness in arms or legs:     Sudden numbness in arms or legs:     Sudden onset of difficulty speaking or slurred speech:    Temporary loss of vision in one eye:     Problems with dizziness:         Gastrointestinal    Blood in stool:     Vomited blood:         Genitourinary    Burning when urinating:     Blood in urine:        Psychiatric    Major depression:         Hematologic    Bleeding problems:    Problems with blood clotting too easily:        Skin    Rashes or ulcers:        Constitutional    Fever or chills:  PHYSICAL EXAM: Vitals:   07/01/21 1032  BP: 137/60  Pulse: 78  Resp: 20   Temp: 98.5 F (36.9 C)  SpO2: 99%  Weight: 175 lb (79.4 kg)  Height: 5\' 9"  (1.753 m)     GENERAL: The patient is a well-nourished male, in no acute distress. The vital signs are documented above. CARDIOVASCULAR: Regular rate and rhythm  PULMONARY: There is good air exchange  MUSCULOSKELETAL: There are no major deformities or cyanosis. NEUROLOGIC: No focal weakness or paresthesias are detected. SKIN: There are no ulcers or rashes noted. PSYCHIATRIC: The patient has a normal affect.  DATA:  CT angiogram personally reviewed.  Infrarenal aneurysm measures about 4 cm by the radiologist measurement.  It seems only slightly larger on my measurements.  He has chronic occlusion of his left iliac arteries with reconstitution via pelvic collaterals of the left common femoral artery.  There is ectasia of the common iliac artery.  There is diffuse atherosclerotic disease throughout the imaged arteries.  ASSESSMENT/PLAN: 85 y.o. male with small infrarenal abdominal aortic aneurysm. We reviewed the natural history and rationale for surveillance for infrarenal aneurysms.  Counseled him extensively that his risk of rupture is less than 1%.  He should continue best medical therapy for peripheral arterial disease, including ASA 81 mg by mouth daily; high intensity statin therapy; good control of chronic comorbidities.  We will see him again in a year with a duplex ultrasound of his infrarenal abdominal aortic aneurysm.  Yevonne Aline. Stanford Breed, MD [facility] Phone Number: [phone] 07/01/2021 12:42 PM   Note: Portions of this report may have been transcribed using voice recognition software.  Every effort has been made to ensure accuracy; however, inadvertent computerized transcription errors may still be present.

## 2021-07-01 ENCOUNTER — Ambulatory Visit: Payer: Medicare Other | Admitting: Vascular Surgery

## 2021-07-01 ENCOUNTER — Other Ambulatory Visit: Payer: Self-pay

## 2021-07-01 ENCOUNTER — Encounter: Payer: Self-pay | Admitting: Vascular Surgery

## 2021-07-01 VITALS — BP 137/60 | HR 78 | Temp 98.5°F | Resp 20 | Ht 69.0 in | Wt 175.0 lb

## 2021-07-01 DIAGNOSIS — I7143 Infrarenal abdominal aortic aneurysm, without rupture: Secondary | ICD-10-CM

## 2021-07-06 DIAGNOSIS — J439 Emphysema, unspecified: Secondary | ICD-10-CM | POA: Diagnosis not present

## 2021-07-12 DIAGNOSIS — Z20822 Contact with and (suspected) exposure to covid-19: Secondary | ICD-10-CM | POA: Diagnosis not present

## 2021-07-12 DIAGNOSIS — Z7982 Long term (current) use of aspirin: Secondary | ICD-10-CM | POA: Diagnosis not present

## 2021-07-12 DIAGNOSIS — J9601 Acute respiratory failure with hypoxia: Secondary | ICD-10-CM

## 2021-07-12 DIAGNOSIS — J9622 Acute and chronic respiratory failure with hypercapnia: Secondary | ICD-10-CM | POA: Diagnosis not present

## 2021-07-12 DIAGNOSIS — J441 Chronic obstructive pulmonary disease with (acute) exacerbation: Secondary | ICD-10-CM

## 2021-07-12 DIAGNOSIS — I700 Atherosclerosis of aorta: Secondary | ICD-10-CM | POA: Diagnosis not present

## 2021-07-12 DIAGNOSIS — I129 Hypertensive chronic kidney disease with stage 1 through stage 4 chronic kidney disease, or unspecified chronic kidney disease: Secondary | ICD-10-CM | POA: Diagnosis not present

## 2021-07-12 DIAGNOSIS — I447 Left bundle-branch block, unspecified: Secondary | ICD-10-CM | POA: Diagnosis not present

## 2021-07-12 DIAGNOSIS — E785 Hyperlipidemia, unspecified: Secondary | ICD-10-CM | POA: Diagnosis not present

## 2021-07-12 DIAGNOSIS — Z79899 Other long term (current) drug therapy: Secondary | ICD-10-CM | POA: Diagnosis not present

## 2021-07-12 DIAGNOSIS — N183 Chronic kidney disease, stage 3 unspecified: Secondary | ICD-10-CM

## 2021-07-12 DIAGNOSIS — J9621 Acute and chronic respiratory failure with hypoxia: Secondary | ICD-10-CM | POA: Diagnosis not present

## 2021-07-12 DIAGNOSIS — Z9981 Dependence on supplemental oxygen: Secondary | ICD-10-CM | POA: Diagnosis not present

## 2021-07-12 DIAGNOSIS — J440 Chronic obstructive pulmonary disease with acute lower respiratory infection: Secondary | ICD-10-CM | POA: Diagnosis not present

## 2021-07-12 DIAGNOSIS — R6520 Severe sepsis without septic shock: Secondary | ICD-10-CM | POA: Diagnosis not present

## 2021-07-12 DIAGNOSIS — Z7951 Long term (current) use of inhaled steroids: Secondary | ICD-10-CM | POA: Diagnosis not present

## 2021-07-12 DIAGNOSIS — R509 Fever, unspecified: Secondary | ICD-10-CM | POA: Diagnosis not present

## 2021-07-12 DIAGNOSIS — J189 Pneumonia, unspecified organism: Secondary | ICD-10-CM

## 2021-07-12 DIAGNOSIS — A419 Sepsis, unspecified organism: Secondary | ICD-10-CM | POA: Insufficient documentation

## 2021-07-12 DIAGNOSIS — R059 Cough, unspecified: Secondary | ICD-10-CM | POA: Diagnosis not present

## 2021-07-12 DIAGNOSIS — Z8679 Personal history of other diseases of the circulatory system: Secondary | ICD-10-CM | POA: Diagnosis not present

## 2021-07-12 DIAGNOSIS — N1832 Chronic kidney disease, stage 3b: Secondary | ICD-10-CM | POA: Insufficient documentation

## 2021-07-12 DIAGNOSIS — Z87891 Personal history of nicotine dependence: Secondary | ICD-10-CM | POA: Diagnosis not present

## 2021-07-12 HISTORY — DX: Chronic kidney disease, stage 3 unspecified: N18.30

## 2021-07-12 HISTORY — DX: Acute respiratory failure with hypoxia: J96.01

## 2021-07-12 HISTORY — DX: Sepsis, unspecified organism: A41.9

## 2021-07-12 HISTORY — DX: Pneumonia, unspecified organism: J18.9

## 2021-07-12 HISTORY — DX: Chronic obstructive pulmonary disease with (acute) exacerbation: J44.1

## 2021-07-16 DIAGNOSIS — J441 Chronic obstructive pulmonary disease with (acute) exacerbation: Secondary | ICD-10-CM | POA: Diagnosis not present

## 2021-07-16 DIAGNOSIS — A419 Sepsis, unspecified organism: Secondary | ICD-10-CM | POA: Diagnosis not present

## 2021-07-16 DIAGNOSIS — J9621 Acute and chronic respiratory failure with hypoxia: Secondary | ICD-10-CM | POA: Diagnosis not present

## 2021-07-16 DIAGNOSIS — J9622 Acute and chronic respiratory failure with hypercapnia: Secondary | ICD-10-CM | POA: Diagnosis not present

## 2021-07-25 DIAGNOSIS — J441 Chronic obstructive pulmonary disease with (acute) exacerbation: Secondary | ICD-10-CM | POA: Diagnosis not present

## 2021-07-29 ENCOUNTER — Encounter (HOSPITAL_BASED_OUTPATIENT_CLINIC_OR_DEPARTMENT_OTHER): Payer: Self-pay | Admitting: Emergency Medicine

## 2021-07-29 ENCOUNTER — Inpatient Hospital Stay (HOSPITAL_COMMUNITY): Payer: Medicare Other

## 2021-07-29 ENCOUNTER — Emergency Department (HOSPITAL_BASED_OUTPATIENT_CLINIC_OR_DEPARTMENT_OTHER): Payer: Medicare Other

## 2021-07-29 ENCOUNTER — Inpatient Hospital Stay (HOSPITAL_BASED_OUTPATIENT_CLINIC_OR_DEPARTMENT_OTHER)
Admission: EM | Admit: 2021-07-29 | Discharge: 2021-08-03 | DRG: 208 | Disposition: A | Discharge status: Home Health | Payer: Medicare Other | Attending: Internal Medicine | Admitting: Internal Medicine

## 2021-07-29 ENCOUNTER — Other Ambulatory Visit: Payer: Self-pay

## 2021-07-29 DIAGNOSIS — R0689 Other abnormalities of breathing: Secondary | ICD-10-CM | POA: Diagnosis not present

## 2021-07-29 DIAGNOSIS — J111 Influenza due to unidentified influenza virus with other respiratory manifestations: Secondary | ICD-10-CM | POA: Diagnosis present

## 2021-07-29 DIAGNOSIS — R059 Cough, unspecified: Secondary | ICD-10-CM | POA: Diagnosis not present

## 2021-07-29 DIAGNOSIS — Z86010 Personal history of colonic polyps: Secondary | ICD-10-CM

## 2021-07-29 DIAGNOSIS — Z8679 Personal history of other diseases of the circulatory system: Secondary | ICD-10-CM

## 2021-07-29 DIAGNOSIS — J1008 Influenza due to other identified influenza virus with other specified pneumonia: Secondary | ICD-10-CM | POA: Diagnosis present

## 2021-07-29 DIAGNOSIS — I493 Ventricular premature depolarization: Secondary | ICD-10-CM | POA: Diagnosis not present

## 2021-07-29 DIAGNOSIS — J90 Pleural effusion, not elsewhere classified: Secondary | ICD-10-CM | POA: Diagnosis not present

## 2021-07-29 DIAGNOSIS — N179 Acute kidney failure, unspecified: Secondary | ICD-10-CM | POA: Diagnosis present

## 2021-07-29 DIAGNOSIS — J101 Influenza due to other identified influenza virus with other respiratory manifestations: Secondary | ICD-10-CM

## 2021-07-29 DIAGNOSIS — E785 Hyperlipidemia, unspecified: Secondary | ICD-10-CM | POA: Diagnosis not present

## 2021-07-29 DIAGNOSIS — J189 Pneumonia, unspecified organism: Secondary | ICD-10-CM

## 2021-07-29 DIAGNOSIS — J9601 Acute respiratory failure with hypoxia: Secondary | ICD-10-CM | POA: Diagnosis not present

## 2021-07-29 DIAGNOSIS — Z4659 Encounter for fitting and adjustment of other gastrointestinal appliance and device: Secondary | ICD-10-CM

## 2021-07-29 DIAGNOSIS — J449 Chronic obstructive pulmonary disease, unspecified: Secondary | ICD-10-CM | POA: Diagnosis not present

## 2021-07-29 DIAGNOSIS — J9621 Acute and chronic respiratory failure with hypoxia: Secondary | ICD-10-CM | POA: Diagnosis not present

## 2021-07-29 DIAGNOSIS — E1165 Type 2 diabetes mellitus with hyperglycemia: Secondary | ICD-10-CM | POA: Diagnosis not present

## 2021-07-29 DIAGNOSIS — Z841 Family history of disorders of kidney and ureter: Secondary | ICD-10-CM

## 2021-07-29 DIAGNOSIS — Z79899 Other long term (current) drug therapy: Secondary | ICD-10-CM

## 2021-07-29 DIAGNOSIS — L8990 Pressure ulcer of unspecified site, unspecified stage: Secondary | ICD-10-CM | POA: Insufficient documentation

## 2021-07-29 DIAGNOSIS — I700 Atherosclerosis of aorta: Secondary | ICD-10-CM | POA: Diagnosis not present

## 2021-07-29 DIAGNOSIS — I10 Essential (primary) hypertension: Secondary | ICD-10-CM | POA: Diagnosis not present

## 2021-07-29 DIAGNOSIS — Z20822 Contact with and (suspected) exposure to covid-19: Secondary | ICD-10-CM | POA: Diagnosis not present

## 2021-07-29 DIAGNOSIS — L89316 Pressure-induced deep tissue damage of right buttock: Secondary | ICD-10-CM | POA: Diagnosis present

## 2021-07-29 DIAGNOSIS — I959 Hypotension, unspecified: Secondary | ICD-10-CM | POA: Diagnosis not present

## 2021-07-29 DIAGNOSIS — G9349 Other encephalopathy: Secondary | ICD-10-CM | POA: Diagnosis not present

## 2021-07-29 DIAGNOSIS — Z7982 Long term (current) use of aspirin: Secondary | ICD-10-CM

## 2021-07-29 DIAGNOSIS — Z8249 Family history of ischemic heart disease and other diseases of the circulatory system: Secondary | ICD-10-CM | POA: Diagnosis not present

## 2021-07-29 DIAGNOSIS — I429 Cardiomyopathy, unspecified: Secondary | ICD-10-CM | POA: Diagnosis not present

## 2021-07-29 DIAGNOSIS — Z9981 Dependence on supplemental oxygen: Secondary | ICD-10-CM | POA: Diagnosis not present

## 2021-07-29 DIAGNOSIS — R0602 Shortness of breath: Secondary | ICD-10-CM | POA: Diagnosis not present

## 2021-07-29 DIAGNOSIS — J159 Unspecified bacterial pneumonia: Secondary | ICD-10-CM | POA: Diagnosis present

## 2021-07-29 DIAGNOSIS — Y95 Nosocomial condition: Secondary | ICD-10-CM

## 2021-07-29 DIAGNOSIS — J9690 Respiratory failure, unspecified, unspecified whether with hypoxia or hypercapnia: Secondary | ICD-10-CM

## 2021-07-29 DIAGNOSIS — Z87891 Personal history of nicotine dependence: Secondary | ICD-10-CM

## 2021-07-29 DIAGNOSIS — J440 Chronic obstructive pulmonary disease with acute lower respiratory infection: Secondary | ICD-10-CM | POA: Diagnosis present

## 2021-07-29 DIAGNOSIS — Z825 Family history of asthma and other chronic lower respiratory diseases: Secondary | ICD-10-CM | POA: Diagnosis not present

## 2021-07-29 DIAGNOSIS — R54 Age-related physical debility: Secondary | ICD-10-CM | POA: Diagnosis not present

## 2021-07-29 DIAGNOSIS — I878 Other specified disorders of veins: Secondary | ICD-10-CM | POA: Diagnosis not present

## 2021-07-29 DIAGNOSIS — Z66 Do not resuscitate: Secondary | ICD-10-CM | POA: Diagnosis present

## 2021-07-29 DIAGNOSIS — I517 Cardiomegaly: Secondary | ICD-10-CM | POA: Diagnosis not present

## 2021-07-29 DIAGNOSIS — I5031 Acute diastolic (congestive) heart failure: Secondary | ICD-10-CM | POA: Diagnosis not present

## 2021-07-29 DIAGNOSIS — Z833 Family history of diabetes mellitus: Secondary | ICD-10-CM

## 2021-07-29 DIAGNOSIS — J441 Chronic obstructive pulmonary disease with (acute) exacerbation: Secondary | ICD-10-CM

## 2021-07-29 DIAGNOSIS — Z4682 Encounter for fitting and adjustment of non-vascular catheter: Secondary | ICD-10-CM | POA: Diagnosis not present

## 2021-07-29 DIAGNOSIS — J432 Centrilobular emphysema: Secondary | ICD-10-CM

## 2021-07-29 DIAGNOSIS — J939 Pneumothorax, unspecified: Secondary | ICD-10-CM

## 2021-07-29 DIAGNOSIS — J9622 Acute and chronic respiratory failure with hypercapnia: Secondary | ICD-10-CM

## 2021-07-29 DIAGNOSIS — Z8546 Personal history of malignant neoplasm of prostate: Secondary | ICD-10-CM

## 2021-07-29 DIAGNOSIS — J9811 Atelectasis: Secondary | ICD-10-CM | POA: Diagnosis not present

## 2021-07-29 DIAGNOSIS — Z9289 Personal history of other medical treatment: Secondary | ICD-10-CM

## 2021-07-29 DIAGNOSIS — E1159 Type 2 diabetes mellitus with other circulatory complications: Secondary | ICD-10-CM | POA: Diagnosis not present

## 2021-07-29 HISTORY — DX: Influenza due to unidentified influenza virus with other respiratory manifestations: J11.1

## 2021-07-29 HISTORY — DX: Respiratory failure, unspecified, unspecified whether with hypoxia or hypercapnia: J96.90

## 2021-07-29 LAB — URINALYSIS, ROUTINE W REFLEX MICROSCOPIC
Bilirubin Urine: NEGATIVE
Glucose, UA: NEGATIVE mg/dL
Ketones, ur: NEGATIVE mg/dL
Leukocytes,Ua: NEGATIVE
Nitrite: NEGATIVE
Protein, ur: 30 mg/dL — AB
Specific Gravity, Urine: 1.015 (ref 1.005–1.030)
pH: 5 (ref 5.0–8.0)

## 2021-07-29 LAB — POCT I-STAT 7, (LYTES, BLD GAS, ICA,H+H)
Acid-base deficit: 2 mmol/L (ref 0.0–2.0)
Bicarbonate: 23.4 mmol/L (ref 20.0–28.0)
Calcium, Ion: 1.25 mmol/L (ref 1.15–1.40)
HCT: 35 % — ABNORMAL LOW (ref 39.0–52.0)
Hemoglobin: 11.9 g/dL — ABNORMAL LOW (ref 13.0–17.0)
O2 Saturation: 97 %
Patient temperature: 98.4
Potassium: 5.6 mmol/L — ABNORMAL HIGH (ref 3.5–5.1)
Sodium: 134 mmol/L — ABNORMAL LOW (ref 135–145)
TCO2: 25 mmol/L (ref 22–32)
pCO2 arterial: 41.9 mmHg (ref 32.0–48.0)
pH, Arterial: 7.355 (ref 7.350–7.450)
pO2, Arterial: 98 mmHg (ref 83.0–108.0)

## 2021-07-29 LAB — BASIC METABOLIC PANEL
Anion gap: 7 (ref 5–15)
BUN: 39 mg/dL — ABNORMAL HIGH (ref 8–23)
CO2: 27 mmol/L (ref 22–32)
Calcium: 9.1 mg/dL (ref 8.9–10.3)
Chloride: 99 mmol/L (ref 98–111)
Creatinine, Ser: 1.2 mg/dL (ref 0.61–1.24)
GFR, Estimated: >60 mL/min (ref >60)
Glucose, Bld: 97 mg/dL (ref 70–99)
Potassium: 5 mmol/L (ref 3.5–5.1)
Sodium: 133 mmol/L — ABNORMAL LOW (ref 135–145)

## 2021-07-29 LAB — I-STAT ARTERIAL BLOOD GAS, ED
Acid-base deficit: 1 mmol/L (ref 0.0–2.0)
Bicarbonate: 28.7 mmol/L — ABNORMAL HIGH (ref 20.0–28.0)
Calcium, Ion: 1.3 mmol/L (ref 1.15–1.40)
HCT: 40 % (ref 39.0–52.0)
Hemoglobin: 13.6 g/dL (ref 13.0–17.0)
O2 Saturation: 100 %
Patient temperature: 98.1
Potassium: 4.7 mmol/L (ref 3.5–5.1)
Sodium: 134 mmol/L — ABNORMAL LOW (ref 135–145)
TCO2: 31 mmol/L (ref 22–32)
pCO2 arterial: 72.3 mmHg (ref 32.0–48.0)
pH, Arterial: 7.205 — ABNORMAL LOW (ref 7.350–7.450)
pO2, Arterial: 351 mmHg — ABNORMAL HIGH (ref 83.0–108.0)

## 2021-07-29 LAB — CBC WITH DIFFERENTIAL/PLATELET
Abs Immature Granulocytes: 0.21 10*3/uL — ABNORMAL HIGH (ref 0.00–0.07)
Basophils Absolute: 0 10*3/uL (ref 0.0–0.1)
Basophils Relative: 0 %
Eosinophils Absolute: 0 10*3/uL (ref 0.0–0.5)
Eosinophils Relative: 0 %
HCT: 36 % — ABNORMAL LOW (ref 39.0–52.0)
Hemoglobin: 11.4 g/dL — ABNORMAL LOW (ref 13.0–17.0)
Immature Granulocytes: 2 %
Lymphocytes Relative: 10 %
Lymphs Abs: 1.1 10*3/uL (ref 0.7–4.0)
MCH: 29.9 pg (ref 26.0–34.0)
MCHC: 31.7 g/dL (ref 30.0–36.0)
MCV: 94.5 fL (ref 80.0–100.0)
Monocytes Absolute: 0.8 10*3/uL (ref 0.1–1.0)
Monocytes Relative: 7 %
Neutro Abs: 9.6 10*3/uL — ABNORMAL HIGH (ref 1.7–7.7)
Neutrophils Relative %: 81 %
Platelets: 237 10*3/uL (ref 150–400)
RBC: 3.81 MIL/uL — ABNORMAL LOW (ref 4.22–5.81)
RDW: 16.6 % — ABNORMAL HIGH (ref 11.5–15.5)
WBC: 11.7 10*3/uL — ABNORMAL HIGH (ref 4.0–10.5)
nRBC: 0 % (ref 0.0–0.2)

## 2021-07-29 LAB — RAPID URINE DRUG SCREEN, HOSP PERFORMED
Amphetamines: NOT DETECTED
Barbiturates: NOT DETECTED
Benzodiazepines: NOT DETECTED
Cocaine: NOT DETECTED
Opiates: POSITIVE — AB
Tetrahydrocannabinol: NOT DETECTED

## 2021-07-29 LAB — URINALYSIS, MICROSCOPIC (REFLEX)

## 2021-07-29 LAB — RESP PANEL BY RT-PCR (FLU A&B, COVID) ARPGX2
Influenza A by PCR: POSITIVE — AB
Influenza B by PCR: NEGATIVE
SARS Coronavirus 2 by RT PCR: NEGATIVE

## 2021-07-29 LAB — BRAIN NATRIURETIC PEPTIDE: B Natriuretic Peptide: 809.3 pg/mL — ABNORMAL HIGH (ref 0.0–100.0)

## 2021-07-29 LAB — CBG MONITORING, ED: Glucose-Capillary: 158 mg/dL — ABNORMAL HIGH (ref 70–99)

## 2021-07-29 LAB — GLUCOSE, CAPILLARY
Glucose-Capillary: 173 mg/dL — ABNORMAL HIGH (ref 70–99)
Glucose-Capillary: 168 mg/dL — ABNORMAL HIGH (ref 70–99)
Glucose-Capillary: 171 mg/dL — ABNORMAL HIGH (ref 70–99)

## 2021-07-29 LAB — MRSA NEXT GEN BY PCR, NASAL: MRSA by PCR Next Gen: NOT DETECTED

## 2021-07-29 LAB — PROCALCITONIN: Procalcitonin: <0.1 ng/mL

## 2021-07-29 LAB — TROPONIN I (HIGH SENSITIVITY)
Troponin I (High Sensitivity): 27 ng/L — ABNORMAL HIGH (ref <18)
Troponin I (High Sensitivity): 30 ng/L — ABNORMAL HIGH (ref <18)

## 2021-07-29 MED ORDER — ASPIRIN 325 MG PO TABS
325.0000 mg | ORAL_TABLET | Freq: Every day | ORAL | Status: DC
  Administered 2021-07-29 – 2021-07-30 (×2): 325 mg
  Filled 2021-07-29: qty 1

## 2021-07-29 MED ORDER — FENTANYL 2500MCG IN NS 250ML (10MCG/ML) PREMIX INFUSION
25.0000 ug/h | INTRAVENOUS | Status: DC
  Administered 2021-07-29: 50 ug/h via INTRAVENOUS
  Administered 2021-07-29: 150 ug/h via INTRAVENOUS
  Administered 2021-07-30: 175 ug/h via INTRAVENOUS
  Filled 2021-07-29: qty 250

## 2021-07-29 MED ORDER — OSELTAMIVIR PHOSPHATE 6 MG/ML PO SUSR
75.0000 mg | Freq: Every day | ORAL | Status: DC
  Administered 2021-07-29: 75 mg
  Filled 2021-07-29 (×2): qty 12.5

## 2021-07-29 MED ORDER — ENOXAPARIN SODIUM 40 MG/0.4ML IJ SOSY
40.0000 mg | PREFILLED_SYRINGE | INTRAMUSCULAR | Status: DC
  Administered 2021-07-29 – 2021-08-03 (×6): 40 mg via SUBCUTANEOUS
  Filled 2021-07-29 (×5): qty 0.4

## 2021-07-29 MED ORDER — MIDAZOLAM HCL 2 MG/2ML IJ SOLN
1.0000 mg | INTRAMUSCULAR | Status: AC | PRN
  Administered 2021-07-29 – 2021-07-30 (×3): 1 mg via INTRAVENOUS

## 2021-07-29 MED ORDER — VANCOMYCIN HCL IN DEXTROSE 1-5 GM/200ML-% IV SOLN
1000.0000 mg | Freq: Once | INTRAVENOUS | Status: AC
  Administered 2021-07-29: 1000 mg via INTRAVENOUS

## 2021-07-29 MED ORDER — ACETAMINOPHEN 500 MG PO TABS
1000.0000 mg | ORAL_TABLET | Freq: Once | ORAL | Status: AC
  Administered 2021-07-29: 1000 mg via ORAL

## 2021-07-29 MED ORDER — LACTATED RINGERS IV BOLUS
500.0000 mL | Freq: Once | INTRAVENOUS | Status: AC
  Administered 2021-07-29: 500 mL via INTRAVENOUS

## 2021-07-29 MED ORDER — CHLORHEXIDINE GLUCONATE CLOTH 2 % EX PADS
6.0000 | MEDICATED_PAD | Freq: Every day | CUTANEOUS | Status: DC
  Administered 2021-07-29 – 2021-08-03 (×6): 6 via TOPICAL

## 2021-07-29 MED ORDER — IPRATROPIUM-ALBUTEROL 0.5-2.5 (3) MG/3ML IN SOLN
3.0000 mL | RESPIRATORY_TRACT | Status: DC | PRN
  Administered 2021-07-29 – 2021-07-30 (×2): 3 mL via RESPIRATORY_TRACT
  Filled 2021-07-29: qty 3

## 2021-07-29 MED ORDER — ALBUTEROL SULFATE (2.5 MG/3ML) 0.083% IN NEBU
5.0000 mg | INHALATION_SOLUTION | Freq: Once | RESPIRATORY_TRACT | Status: AC
  Administered 2021-07-29: 5 mg via RESPIRATORY_TRACT

## 2021-07-29 MED ORDER — METHYLPREDNISOLONE SODIUM SUCC 125 MG IJ SOLR
80.0000 mg | Freq: Every day | INTRAMUSCULAR | Status: DC
  Administered 2021-07-29: 80 mg via INTRAVENOUS

## 2021-07-29 MED ORDER — ARFORMOTEROL TARTRATE 15 MCG/2ML IN NEBU
15.0000 ug | INHALATION_SOLUTION | Freq: Two times a day (BID) | RESPIRATORY_TRACT | Status: DC
  Administered 2021-07-30 – 2021-08-03 (×8): 15 ug via RESPIRATORY_TRACT
  Filled 2021-07-29 (×12): qty 2

## 2021-07-29 MED ORDER — DEXMEDETOMIDINE HCL IN NACL 400 MCG/100ML IV SOLN: 0.4000 ug/kg/h | INTRAVENOUS | Status: DC

## 2021-07-29 MED ORDER — POLYETHYLENE GLYCOL 3350 17 G PO PACK: 17.0000 g | PACK | Freq: Every day | ORAL | Status: DC

## 2021-07-29 MED ORDER — CEFEPIME HCL 2 G IJ SOLR
2.0000 g | Freq: Once | INTRAMUSCULAR | Status: AC
  Administered 2021-07-29: 2 g via INTRAVENOUS

## 2021-07-29 MED ORDER — POLYETHYLENE GLYCOL 3350 17 G PO PACK: 17.0000 g | PACK | Freq: Every day | ORAL | Status: DC | PRN

## 2021-07-29 MED ORDER — FENTANYL CITRATE PF 50 MCG/ML IJ SOSY: 25.0000 ug | PREFILLED_SYRINGE | Freq: Once | INTRAMUSCULAR | Status: DC

## 2021-07-29 MED ORDER — MIDAZOLAM HCL 2 MG/2ML IJ SOLN
1.0000 mg | INTRAMUSCULAR | Status: DC | PRN
  Administered 2021-07-29 – 2021-07-30 (×4): 1 mg via INTRAVENOUS
  Filled 2021-07-29: qty 2

## 2021-07-29 MED ORDER — ONDANSETRON HCL 4 MG/2ML IJ SOLN
4.0000 mg | Freq: Four times a day (QID) | INTRAMUSCULAR | Status: DC | PRN
  Administered 2021-07-31: 4 mg via INTRAVENOUS
  Filled 2021-07-29 (×2): qty 2

## 2021-07-29 MED ORDER — PROPOFOL 1000 MG/100ML IV EMUL
0.0000 ug/kg/min | INTRAVENOUS | Status: DC
  Administered 2021-07-29: 5 ug/kg/min via INTRAVENOUS

## 2021-07-29 MED ORDER — ETOMIDATE 2 MG/ML IV SOLN
25.0000 mg | Freq: Once | INTRAVENOUS | Status: AC
  Administered 2021-07-29: 25 mg via INTRAVENOUS

## 2021-07-29 MED ORDER — FENTANYL CITRATE (PF) 100 MCG/2ML IJ SOLN
25.0000 ug | Freq: Once | INTRAMUSCULAR | Status: DC
  Filled 2021-07-29: qty 2

## 2021-07-29 MED ORDER — FENTANYL BOLUS VIA INFUSION
25.0000 ug | INTRAVENOUS | Status: DC | PRN
  Administered 2021-07-29: 25 ug via INTRAVENOUS
  Administered 2021-07-29: 100 ug via INTRAVENOUS
  Administered 2021-07-29: 50 ug via INTRAVENOUS
  Administered 2021-07-29: 25 ug via INTRAVENOUS
  Administered 2021-07-30: 100 ug via INTRAVENOUS
  Administered 2021-07-30: 50 ug via INTRAVENOUS
  Filled 2021-07-29: qty 100

## 2021-07-29 MED ORDER — ORAL CARE MOUTH RINSE
15.0000 mL | OROMUCOSAL | Status: DC
  Administered 2021-07-29 – 2021-07-30 (×11): 15 mL via OROMUCOSAL

## 2021-07-29 MED ORDER — NOREPINEPHRINE 4 MG/250ML-% IV SOLN
2.0000 ug/min | INTRAVENOUS | Status: DC
  Administered 2021-07-29: 5 ug/min via INTRAVENOUS
  Administered 2021-07-30: 2 ug/min via INTRAVENOUS
  Filled 2021-07-29: qty 250

## 2021-07-29 MED ORDER — DOCUSATE SODIUM 50 MG/5ML PO LIQD: 100.0000 mg | Freq: Every day | ORAL | Status: DC | PRN

## 2021-07-29 MED ORDER — DEXMEDETOMIDINE HCL IN NACL 200 MCG/50ML IV SOLN
0.4000 ug/kg/h | INTRAVENOUS | Status: DC
Start: 2021-07-29 — End: 2021-07-29
  Filled 2021-07-29: qty 50

## 2021-07-29 MED ORDER — DOCUSATE SODIUM 100 MG PO CAPS: 100.0000 mg | ORAL_CAPSULE | Freq: Two times a day (BID) | ORAL | Status: DC | PRN

## 2021-07-29 MED ORDER — CHLORHEXIDINE GLUCONATE 0.12% ORAL RINSE (MEDLINE KIT)
15.0000 mL | Freq: Two times a day (BID) | OROMUCOSAL | Status: DC
  Administered 2021-07-29 – 2021-08-03 (×9): 15 mL via OROMUCOSAL

## 2021-07-29 MED ORDER — REVEFENACIN 175 MCG/3ML IN SOLN
175.0000 ug | Freq: Every day | RESPIRATORY_TRACT | Status: DC
  Administered 2021-07-30 – 2021-08-03 (×5): 175 ug via RESPIRATORY_TRACT
  Filled 2021-07-29 (×8): qty 3

## 2021-07-29 MED ORDER — DOCUSATE SODIUM 50 MG/5ML PO LIQD
100.0000 mg | Freq: Two times a day (BID) | ORAL | Status: DC
  Administered 2021-07-29: 100 mg

## 2021-07-29 MED ORDER — PANTOPRAZOLE SODIUM 40 MG IV SOLR
40.0000 mg | Freq: Every day | INTRAVENOUS | Status: DC
  Administered 2021-07-29: 40 mg via INTRAVENOUS

## 2021-07-29 MED ORDER — ROCURONIUM BROMIDE 50 MG/5ML IV SOLN
80.0000 mg | Freq: Once | INTRAVENOUS | Status: AC
  Administered 2021-07-29: 80 mg via INTRAVENOUS
  Filled 2021-07-29: qty 8

## 2021-07-29 MED ORDER — METHYLPREDNISOLONE SODIUM SUCC 125 MG IJ SOLR
125.0000 mg | Freq: Once | INTRAMUSCULAR | Status: AC
  Administered 2021-07-29: 125 mg via INTRAVENOUS

## 2021-07-29 MED ORDER — IOHEXOL 350 MG/ML SOLN
100.0000 mL | Freq: Once | INTRAVENOUS | Status: AC | PRN
  Administered 2021-07-29: 100 mL via INTRAVENOUS

## 2021-07-29 MED ORDER — BUDESONIDE 0.5 MG/2ML IN SUSP
0.2500 mg | Freq: Two times a day (BID) | RESPIRATORY_TRACT | Status: DC
  Administered 2021-07-29 – 2021-08-03 (×9): 0.25 mg via RESPIRATORY_TRACT
  Filled 2021-07-29 (×9): qty 2

## 2021-07-29 MED ORDER — IPRATROPIUM BROMIDE 0.02 % IN SOLN
0.5000 mg | Freq: Once | RESPIRATORY_TRACT | Status: AC
  Administered 2021-07-29: 0.5 mg via RESPIRATORY_TRACT

## 2021-07-29 MED ORDER — IPRATROPIUM-ALBUTEROL 0.5-2.5 (3) MG/3ML IN SOLN
3.0000 mL | Freq: Three times a day (TID) | RESPIRATORY_TRACT | Status: DC
  Administered 2021-07-29: 3 mL via RESPIRATORY_TRACT

## 2021-07-29 MED ORDER — SODIUM CHLORIDE 0.9 % IV SOLN: 250.0000 mL | INTRAVENOUS | Status: DC

## 2021-07-29 NOTE — Progress Notes (Signed)
Patient SPO2 dropped to 74% after transfer to room 7.  Patient was placed on 2 liter nasal cannula.  Patients SPO2 increased to 100%.  RT will continue to monitor.

## 2021-07-29 NOTE — Progress Notes (Signed)
Hypotensive upon arrival to [facility] on fentanyl 58mcg & propofol 41mcg.  He was given paralytics en route, not sure if he was given bolus sedation. Propofol stopped, 500cc LR bolus given, Norepi started peripherally.  BP (!) 129/49   Pulse 86   Temp 98.3 F (36.8 C) (Axillary)   Resp 20   Ht 5\' 9"  (1.753 m)   Wt 79.4 kg   SpO2 98%   BMI 25.84 kg/m  Critically ill appearing man lying in bed in NAD [place]/AT Breathing synchronouly with MV, no obstruction on waveforms. Pplat 21, DP 16. Symmetric breath sounds. S1S2, bradycardic, irreg rhythm Abd soft, NT Skin warm, dry Mild pedal edema but wrinkles in feet and shins.  RASS -5  BNP 809 WBC 11.7 7.21/72/351/28.7- unknown if vent adjustments made since then.  Repeat CXR ordered to reevaluate possible pneumothorax. No concerning peak pressures on the vent. Of note, no pneumothorax present on CT scan done before intubation. PCT still not resulted. UA with 0-5 WBCs.  No blood cultures drawn in the ED. No infiltrates on CTA to suggest pneumonia. Tamiflu, aspirin, lovenox not given at [facility] Re-ordering. Stop propofol, con't fentanyl. Can use versed PRNs. With bradycardia would recommend against precedex at this point. Con't NE peripherally. Will need CVC if needing >10 Repeat ABG in 1 hour.  Con't steroids, BDs. Code status DNR is appropriate with severe underlying cardiac and pulmonary disease. He may be difficult to wean from MV.  This patient is critically ill with multiple organ system failure which requires frequent high complexity decision making, assessment, support, evaluation, and titration of therapies. This was completed through the application of advanced monitoring technologies and extensive interpretation of multiple databases. During this encounter critical care time was devoted to patient care services described in this note for 35 minutes.  Julian Hy, DO 07/29/21 4:01 PM [facility]

## 2021-07-29 NOTE — ED Notes (Signed)
XR at bedside

## 2021-07-29 NOTE — ED Notes (Signed)
Attempted IV , unable to obtain, was able to collect labs.  RT at bedside for treatment.

## 2021-07-29 NOTE — ED Notes (Signed)
PT placed on Zoll per Physician for intubation. Room cleaned post intubation and pt cleaned, dressed, and all lines organized. Family outside of room and checked on multiple times and offered food and drink. Call bell at bedside for Family member.

## 2021-07-29 NOTE — ED Notes (Signed)
Report given to Martinique Weaver, RN, receiving MICU nurse at [facility].

## 2021-07-29 NOTE — ED Notes (Signed)
Report given to [facility]  ETA 10-15 min

## 2021-07-29 NOTE — Progress Notes (Signed)
CXR without left-sided pneumothorax. ETT 7-8cm above carina, order placed to be advanced 4cm. D/w RT.  Julian Hy, DO 07/29/21 4:53 PM [facility]

## 2021-07-29 NOTE — Progress Notes (Signed)
Brief Progress Note  Called to bedside. Patient had arrived from [place] and was hypotensive.  He had been given Rocuronium in transport, and most likely sedation   Orders placed for peripheral Levophed, and 500 cc LR bolus.  Will recheck ABG 1 hour after arrival to [facility] ICU Orders placed PCT and Echo also ordered Will repeat CXR to reassess ? Very small pneumothorax and check KUB for OG placement  Will have nursing wean peripheral Levophed for MAP > 65. Hopeful once sedation reaches half life, BP will respond and can wean pressors off.   Magdalen Spatz, MSN, AGACNP-BC [place] for personal pager PCCM on call pager [phone]  07/29/2021 4:02 PM

## 2021-07-29 NOTE — H&P (Signed)
NAME:  Harry Norris, MRN:  [id number], DOB:  Nov 30, 1935, LOS: 0 ADMISSION DATE:  07/29/2021, CONSULTATION DATE:  07/29/21 REFERRING MD:  Ronnald Nian, CHIEF COMPLAINT:  SOB   History of Present Illness:  85 year old man with hx of COPD FEV1 45% pred on 2LPM HOT (follows Dr. Lamonte Sakai), DM2, cardiomyopathy, anemia who was just hospitalized with RLL pneumonia (See CT chest 07/12/2021) earlier this month who presents with recurrent congestion, dyspnea and wheezing.  Initially improved in ER then went less responsive requiring emergent intubation after discussion with son.  Influenza returned positive.  Patient intubated and sedated so hx per chart review.  Pertinent  Medical History  Cardiomyopathy EF 35% HTN HLD COPD Chronic rhinitis  Significant Hospital Events: Including procedures, antibiotic start and stop dates in addition to other pertinent events   11/29 [facility] admit, intubate, tx [facility]  Interim History / Subjective:  Consulted  Objective   Blood pressure (!) 183/106, pulse (!) 113, temperature 98.1 F (36.7 C), temperature source Oral, resp. rate 20, height 5\' 9"  (1.753 m), weight 79.4 kg, SpO2 100 %.    Vent Mode: PRVC FiO2 (%):  [40 %-100 %] 40 % Set Rate:  [16 bmp-20 bmp] 20 bmp Vt Set:  [560 mL] 560 mL PEEP:  [5 cmH20] 5 cmH20 Plateau Pressure:  [22 cmH20] 22 cmH20   Intake/Output Summary (Last 24 hours) at 07/29/2021 1142 Last data filed at 07/29/2021 1124 Gross per 24 hour  Intake 100 ml  Output --  Net 100 ml   Filed Weights   07/29/21 0553  Weight: 79.4 kg    Examination: General: chronically ill appearing man on vent HENT: ETT in place, minimal secretions Lungs: diffuse wheezing, passive on vent Cardiovascular: Irregular, tachycardic (PVCs on monitor, sinus tach) Abdomen: Soft, +BS Extremities: Chronic venous stasis without superimposed [place] Neuro: was moving all 4 ext prior to intubation per family Skin: ashen  BNP 11/12: 348, here is in 800s CT chest  personally reviewed notable for broncho-tracheomalacia without acute infiltrates  Resolved Hospital Problem list   N/a  Assessment & Plan:  Acute on chronic hypoxemic and hypercapneic respiratory failure in patient who contracted flu with frail cardiopulmonary baseline (COPD, cardiomyopathy).  Do not see any clear evidence of infection or fluid overload at present.  Acute hypercarbic encephalopathy, re-eval once pH corrects to see if wakes up  Questionable PTX on CXR: will repeat but think its just a skin fold  Cardiomyopathy, elevated BNP, trop flat; OP f/u, consider diuresis if get too far behind fluidwise  DNR  - Vent support, VAP prevention bundle - Fent/Prop/Precedex titrted to RASS -1 - Tamiflu, check Pct and UA, do not really see need to continue abx at present unless these labs indicate to do so - Nebs, steroids - Son updated at bedside, patient DNR but wants trial of mechanical ventilation  Best Practice (right click and "Reselect all SmartList Selections" daily)   Diet/type: NPO DVT prophylaxis: LMWH GI prophylaxis: PPI Lines: N/A Foley:  Yes, and it is still needed Code Status:  DNR Last date of multidisciplinary goals of care discussion [07/29/21]  Labs   CBC: Recent Labs  Lab 07/29/21 0820 07/29/21 1105  WBC 11.7*  --   NEUTROABS 9.6*  --   HGB 11.4* 13.6  HCT 36.0* 40.0  MCV 94.5  --   PLT 237  --     Basic Metabolic Panel: Recent Labs  Lab 07/29/21 0742 07/29/21 1105  NA 133* 134*  K 5.0 4.7  CL 99  --   CO2 27  --   GLUCOSE 97  --   BUN 39*  --   CREATININE 1.20  --   CALCIUM 9.1  --    GFR: Estimated Creatinine Clearance: 46.6 mL/min (by C-G formula based on SCr of 1.2 mg/dL). Recent Labs  Lab 07/29/21 0820  WBC 11.7*    Liver Function Tests: No results for input(s): AST, ALT, ALKPHOS, BILITOT, PROT, ALBUMIN in the last 168 hours. No results for input(s): LIPASE, AMYLASE in the last 168 hours. No results for input(s): AMMONIA in  the last 168 hours.  ABG    Component Value Date/Time   PHART 7.205 (L) 07/29/2021 1105   PCO2ART 72.3 (HH) 07/29/2021 1105   PO2ART 351 (H) 07/29/2021 1105   HCO3 28.7 (H) 07/29/2021 1105   TCO2 31 07/29/2021 1105   ACIDBASEDEF 1.0 07/29/2021 1105   O2SAT 100.0 07/29/2021 1105     Coagulation Profile: No results for input(s): INR, PROTIME in the last 168 hours.  Cardiac Enzymes: No results for input(s): CKTOTAL, CKMB, CKMBINDEX, TROPONINI in the last 168 hours.  HbA1C: No results found for: HGBA1C  CBG: No results for input(s): GLUCAP in the last 168 hours.  Review of Systems:   Cannot assess, coma  Past Medical History:  He,  has a past medical history of AAA (abdominal aortic aneurysm), Anemia, Asthma (02/07/2018), Benign neoplasm of colon, Bradycardia (12/19/2018), Bronchitis, Cancer ([place]), Cardiomyopathy ([place]) (12/18/2015), Carotid artery occlusion, Chronic rhinitis (02/26/2016), Chronic venous insufficiency (12/18/2015), COPD with emphysema Gold C  (02/27/2014), Diverticulosis of colon (without mention of hemorrhage), DM type 2 (diabetes mellitus, type 2) ([place]), Fatigue (12/12/2018), Hyperlipidemia, Hypertension, Hypertensive heart disease, Hypoxemia (04/10/2014), Kidney stone, LBBB (left bundle branch block) (12/18/2015), Leg swelling (11/28/2013), Orthostatic hypotension (12/19/2018), Other primary cardiomyopathies, Other symptoms involving cardiovascular system (11/28/2013), Prostate cancer ([place]), Syncope (12/18/2015), Unspecified venous (peripheral) insufficiency, Varicose veins, and Varicose veins of lower extremities with other complications (12/23/9561).   Surgical History:   Past Surgical History:  Procedure Laterality Date    cardiac catheterization  may 2011   CATARACT EXTRACTION Bilateral    ENDOVENOUS ABLATION SAPHENOUS VEIN W/ LASER Right 03-29-2014   EVLA right greater saphenous vein     gold seed implants  2011   treatment of prostate cancer   HERNIA REPAIR Bilateral     LITHOTRIPSY  2010   right ear surgery       Social History:   reports that he quit smoking about 21 years ago. His smoking use included cigarettes. He has a 100.00 pack-year smoking history. He has never used smokeless tobacco. He reports that he does not drink alcohol and does not use drugs.   Family History:  His family history includes Asthma in his sister; Cancer - Colon in his mother; Diabetes in his mother; Emphysema in his sister; Heart disease in his sister; Heart failure in his father; Hypertension in his mother; Kidney failure in his father.   Allergies Allergies  Allergen Reactions   Ace Inhibitors Cough     Home Medications  Prior to Admission medications   Medication Sig Start Date End Date Taking? Authorizing Provider  aspirin 325 MG tablet Take 325 mg by mouth daily.    [provider]  atorvastatin (LIPITOR) 40 MG tablet Take 40 mg by mouth daily. Patient not taking: Reported on 07/01/2021    [provider]  budesonide (PULMICORT) 180 MCG/ACT inhaler Inhale 2 puffs into the lungs 2 (two) times daily. Patient  not taking: Reported on 07/01/2021    [provider]  fenofibrate 160 MG tablet Take 160 mg by mouth daily.    [provider]  formoterol (PERFOROMIST) 20 MCG/2ML nebulizer solution Take 20 mcg by nebulization 2 (two) times daily.    [provider]  furosemide (LASIX) 40 MG tablet Take 40 mg by mouth daily.  Patient not taking: Reported on 07/01/2021    [provider]  Glycopyrrolate-Formoterol (BEVESPI AEROSPHERE) 9-4.8 MCG/ACT AERO Inhale 2 puffs into the lungs daily. Patient not taking: Reported on 07/01/2021    [provider]  IPRATROPIUM BROMIDE HFA IN Inhale 0.5 mg into the lungs daily as needed. Patient not taking: Reported on 07/01/2021    [provider]  losartan (COZAAR) 100 MG tablet Take 100 mg by mouth daily.    [provider]  Methylcellulose, Laxative, 500 MG TABS  Take 500 mg by mouth 2 (two) times daily. Patient not taking: Reported on 07/01/2021    [provider]  Multiple Vitamin (MULTIVITAMIN) capsule Take 1 capsule by mouth daily.    [provider]  Omega-3 Fatty Acids (FISH OIL) 1000 MG CAPS Take 1,000 mg by mouth 2 (two) times daily.    [provider]  vitamin C (ASCORBIC ACID) 500 MG tablet Take 500 mg by mouth daily.    [provider]  vitamin E 400 UNIT capsule Take 400 Units by mouth daily. Patient not taking: Reported on 07/01/2021    [provider]     Critical care time: 41 minutes

## 2021-07-29 NOTE — ED Provider Notes (Addendum)
[facility] EMERGENCY DEPARTMENT Provider Note   CSN: [id number] Arrival date & time: 07/29/21  0546     History Chief Complaint  Patient presents with   Shortness of Breath    Harry Norris is a 85 y.o. male.  The history is provided by the patient.  Shortness of Breath Severity:  Moderate Onset quality:  Gradual Duration:  2 weeks Timing:  Intermittent Progression:  Waxing and waning Chronicity:  Recurrent Context: URI   Relieved by:  Nothing Worsened by:  Exertion Associated symptoms: cough and wheezing   Associated symptoms: no abdominal pain, no chest pain, no claudication, no ear pain, no fever, no rash, no sore throat and no vomiting       Past Medical History:  Diagnosis Date   AAA (abdominal aortic aneurysm)    Anemia    Asthma 02/07/2018   Benign neoplasm of colon    Bradycardia 12/19/2018   Bronchitis    Cancer ([place])    prostate   Cardiomyopathy ([place]) 12/18/2015   Carotid artery occlusion    Chronic rhinitis 02/26/2016   Chronic venous insufficiency 12/18/2015   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   PFTs 01/18/14:  FeV1 1.58 45% FVC 2.0 50% FeV1/FVC 79% Fef 25-75 795 ( 40% improved after BD) CXR: Copd changes ONO 03/07/14 desats to <88% on RA    Diverticulosis of colon (without mention of hemorrhage)    DM type 2 (diabetes mellitus, type 2) ([place])    Fatigue 12/12/2018   Hyperlipidemia    Hypertension    Hypertensive heart disease    Hypoxemia 04/10/2014   Overview:  Last Assessment & Plan:  Cont nocturnal oxygen therapy 2L    Kidney stone    LBBB (left bundle branch block) 12/18/2015   Leg swelling 11/28/2013   Orthostatic hypotension 12/19/2018   Other primary cardiomyopathies    Other symptoms involving cardiovascular system 11/28/2013   Prostate cancer ([place])    prostate    Syncope 12/18/2015   Unspecified venous (peripheral) insufficiency    Varicose veins    Varicose veins of lower extremities with other complications 03/12/4579     Patient Active Problem List   Diagnosis Date Noted   Bradycardia 12/19/2018   Orthostatic hypotension 12/19/2018   Fatigue 12/12/2018   Hyperlipidemia 02/07/2018   Asthma 02/07/2018   Chronic rhinitis 02/26/2016   Cardiomyopathy ([place]) 12/18/2015   Chronic venous insufficiency 12/18/2015   LBBB (left bundle branch block) 12/18/2015   Syncope 12/18/2015   Hypoxemia 04/10/2014   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   Hypertensive heart disease    Prostate cancer ([place])    AAA (abdominal aortic aneurysm)    Carotid artery occlusion    DM type 2 (diabetes mellitus, type 2) ([place])    Leg swelling 11/28/2013   Other symptoms involving cardiovascular system 11/28/2013   Varicose veins of lower extremities with other complications 99/83/3825    Past Surgical History:  Procedure Laterality Date    cardiac catheterization  may 2011   CATARACT EXTRACTION Bilateral    ENDOVENOUS ABLATION SAPHENOUS VEIN W/ LASER Right 03-29-2014   EVLA right greater saphenous vein     gold seed implants  2011   treatment of prostate cancer   HERNIA REPAIR Bilateral    LITHOTRIPSY  2010   right ear surgery         Family History  Problem Relation Age of Onset   Cancer - Colon Mother  deceased   Hypertension Mother    Diabetes Mother    Emphysema Sister        deceased   Heart disease Sister    Asthma Sister    Kidney failure Father    Heart failure Father     Social History   Tobacco Use   Smoking status: Former    Packs/day: 2.50    Years: 40.00    Pack years: 100.00    Types: Cigarettes    Quit date: 09/01/1999    Years since quitting: 21.9   Smokeless tobacco: Never  Vaping Use   Vaping Use: Never used  Substance Use Topics   Alcohol use: No   Drug use: No    Home Medications Prior to Admission medications   Medication Sig Start Date End Date Taking? Authorizing Provider  aspirin 325 MG tablet Take 325 mg by mouth daily.    [provider]   atorvastatin (LIPITOR) 40 MG tablet Take 40 mg by mouth daily. Patient not taking: Reported on 07/01/2021    [provider]  budesonide (PULMICORT) 180 MCG/ACT inhaler Inhale 2 puffs into the lungs 2 (two) times daily. Patient not taking: Reported on 07/01/2021    [provider]  fenofibrate 160 MG tablet Take 160 mg by mouth daily.    [provider]  formoterol (PERFOROMIST) 20 MCG/2ML nebulizer solution Take 20 mcg by nebulization 2 (two) times daily.    [provider]  furosemide (LASIX) 40 MG tablet Take 40 mg by mouth daily.  Patient not taking: Reported on 07/01/2021    [provider]  Glycopyrrolate-Formoterol (BEVESPI AEROSPHERE) 9-4.8 MCG/ACT AERO Inhale 2 puffs into the lungs daily. Patient not taking: Reported on 07/01/2021    [provider]  IPRATROPIUM BROMIDE HFA IN Inhale 0.5 mg into the lungs daily as needed. Patient not taking: Reported on 07/01/2021    [provider]  losartan (COZAAR) 100 MG tablet Take 100 mg by mouth daily.    [provider]  Methylcellulose, Laxative, 500 MG TABS Take 500 mg by mouth 2 (two) times daily. Patient not taking: Reported on 07/01/2021    [provider]  Multiple Vitamin (MULTIVITAMIN) capsule Take 1 capsule by mouth daily.    [provider]  Omega-3 Fatty Acids (FISH OIL) 1000 MG CAPS Take 1,000 mg by mouth 2 (two) times daily.    [provider]  vitamin C (ASCORBIC ACID) 500 MG tablet Take 500 mg by mouth daily.    [provider]  vitamin E 400 UNIT capsule Take 400 Units by mouth daily. Patient not taking: Reported on 07/01/2021    [provider]    Allergies    Ace inhibitors  Review of Systems   Review of Systems  Constitutional:  Negative for chills and fever.  HENT:  Negative for ear pain and sore throat.   Eyes:  Negative for pain and visual disturbance.  Respiratory:  Positive for cough, shortness of  breath and wheezing.   Cardiovascular:  Positive for leg swelling. Negative for chest pain, palpitations and claudication.  Gastrointestinal:  Negative for abdominal pain and vomiting.  Genitourinary:  Negative for dysuria and hematuria.  Musculoskeletal:  Negative for arthralgias and back pain.  Skin:  Negative for color change and rash.  Neurological:  Negative for seizures and syncope.  All other systems reviewed and are negative.  Physical Exam Updated Vital Signs BP (!) 115/104   Pulse (!) 109   Temp 98.1  F (36.7 C) (Oral)   Resp (!) 24   Ht 5\' 9"  (1.753 m)   Wt 79.4 kg   SpO2 94%   BMI 25.84 kg/m   Physical Exam Vitals and nursing note reviewed.  Constitutional:      General: He is not in acute distress.    Appearance: He is well-developed. He is not ill-appearing.  HENT:     Head: Normocephalic and atraumatic.  Eyes:     Conjunctiva/sclera: Conjunctivae normal.  Cardiovascular:     Rate and Rhythm: Normal rate and regular rhythm.     Pulses: Normal pulses.     Heart sounds: Normal heart sounds. No murmur heard. Pulmonary:     Effort: Pulmonary effort is normal. Tachypnea present. No respiratory distress.     Breath sounds: Wheezing present.  Abdominal:     Palpations: Abdomen is soft.     Tenderness: There is no abdominal tenderness.  Musculoskeletal:        General: No swelling. Normal range of motion.     Cervical back: Neck supple.     Right lower leg: Edema present.     Left lower leg: Edema present.  Skin:    General: Skin is warm and dry.     Capillary Refill: Capillary refill takes less than 2 seconds.  Neurological:     General: No focal deficit present.     Mental Status: He is alert.  Psychiatric:        Mood and Affect: Mood normal.    ED Results / Procedures / Treatments   Labs (all labs ordered are listed, but only abnormal results are displayed) Labs Reviewed  RESP PANEL BY RT-PCR (FLU A&B, COVID) ARPGX2 - Abnormal; Notable for the  following components:      Result Value   Influenza A by PCR POSITIVE (*)    All other components within normal limits  BASIC METABOLIC PANEL - Abnormal; Notable for the following components:   Sodium 133 (*)    BUN 39 (*)    All other components within normal limits  BRAIN NATRIURETIC PEPTIDE - Abnormal; Notable for the following components:   B Natriuretic Peptide 809.3 (*)    All other components within normal limits  CBC WITH DIFFERENTIAL/PLATELET - Abnormal; Notable for the following components:   WBC 11.7 (*)    RBC 3.81 (*)    Hemoglobin 11.4 (*)    HCT 36.0 (*)    RDW 16.6 (*)    Neutro Abs 9.6 (*)    Abs Immature Granulocytes 0.21 (*)    All other components within normal limits  TROPONIN I (HIGH SENSITIVITY) - Abnormal; Notable for the following components:   Troponin I (High Sensitivity) 27 (*)    All other components within normal limits  CBC WITH DIFFERENTIAL/PLATELET  I-STAT ARTERIAL BLOOD GAS, ED  TROPONIN I (HIGH SENSITIVITY)    EKG EKG Interpretation  Date/Time:  Tuesday July 29 2021 10:03:35 EST Ventricular Rate:  122 PR Interval:  140 QRS Duration: 146 QT Interval:  332 QTC Calculation: 473 R Axis:   259 Text Interpretation: Sinus tachycardia Paired ventricular premature complexes Confirmed by Lennice Sites ([id number]) on 07/29/2021 10:06:17 AM  Radiology CT Angio Chest PE W and/or Wo Contrast  Result Date: 07/29/2021 CLINICAL DATA:  85 year old male with history of shortness of breath, cough, and influenza A. EXAM: CT ANGIOGRAPHY CHEST WITH CONTRAST TECHNIQUE: Multidetector CT imaging of the chest was performed using the standard protocol during bolus administration of intravenous contrast.  Multiplanar CT image reconstructions and MIPs were obtained to evaluate the vascular anatomy. CONTRAST:  100 mL Omnipaque 350, intravenous COMPARISON:  07/29/2021, 06/06/2021, 07/12/2021 FINDINGS: Cardiovascular: Satisfactory opacification of the pulmonary arteries to  the segmental level. No evidence of pulmonary embolism. Similar appearing mild global cardiomegaly. No pericardial effusion. Atherosclerotic calcifications of the thoracic aorta which is normal in caliber and appears patent. Mild coronary atherosclerotic calcifications. Mediastinum/Nodes: No enlarged mediastinal, hilar, or axillary lymph nodes. Thyroid gland, trachea, and esophagus demonstrate no significant findings. Lungs/Pleura: Fine parenchymal details limited by respiratory motion artifact. Scattered right basilar and focal left basilar peripheral consolidative opacities, slightly less conspicuous than comparison. Again seen is right basilar pleural thickening versus trac pleural effusion. Significantly decreased conspicuity of previously visualized focal ground-glass opacity in the superior segment of the left lower lobe. No pneumothorax. Upper Abdomen: The visualized upper abdomen is within normal limits. Musculoskeletal: Multilevel mild degenerative changes of the thoracic spine. Similar appearing right sternoclavicular joint degenerative change. No acute osseous abnormality. Review of the MIP images confirms the above findings. IMPRESSION: Vascular: 1. No evidence of pulmonary embolism. 2. Similar appearing coronary and aortic atherosclerosis (ICD10-I70.0). 3. Similar appearing mild global cardiomegaly. Non-Vascular: Global improvement of consolidative and ground-glass opacities in the bilateral lung bases and superior segment of the left lower lobe, with the remaining opacities most prominent in the dependent right lower lobe. There is persistent posterior right lower lobe reactive pleural thickening versus trace pleural effusion Ruthann Cancer, MD [facility] Electronically Signed   By: Ruthann Cancer M.D.   On: 07/29/2021 09:20   DG Chest Portable 1 View  Result Date: 07/29/2021 CLINICAL DATA:  Shortness of breath and cough. EXAM: PORTABLE  CHEST 1 VIEW COMPARISON:  07/12/2021 FINDINGS: 0652 hours. The cardio pericardial silhouette is enlarged. Lungs are hyperexpanded. Interstitial markings are diffusely coarsened with chronic features. Minimal basilar atelectasis or infiltrate noted bilaterally. No substantial pleural effusion. Bones are diffusely demineralized. Telemetry leads overlie the chest. IMPRESSION: Chronic interstitial changes with bibasilar atelectasis or infiltrate. Electronically Signed   By: Misty Stanley M.D.   On: 07/29/2021 07:35    Procedures .Critical Care Performed by: Lennice Sites, DO Authorized by: Lennice Sites, DO   Critical care provider statement:    Critical care time (minutes):  38   Critical care was necessary to treat or prevent imminent or life-threatening deterioration of the following conditions:  Sepsis and respiratory failure   Critical care was time spent personally by me on the following activities:  Blood draw for specimens, development of treatment plan with patient or surrogate, discussions with primary provider, evaluation of patient's response to treatment, examination of patient, obtaining history from patient or surrogate, ordering and performing treatments and interventions, ordering and review of laboratory studies, ordering and review of radiographic studies, pulse oximetry, review of old charts and re-evaluation of patient's condition   Care discussed with: admitting provider   Procedure Name: Intubation Date/Time: 07/29/2021 10:40 AM Performed by: Lennice Sites, DO Pre-anesthesia Checklist: Patient identified, Patient being monitored, Emergency Drugs available, Timeout performed and Suction available Oxygen Delivery Method: Non-rebreather mask Preoxygenation: Pre-oxygenation with 100% oxygen Induction Type: Rapid sequence Ventilation: Mask ventilation without difficulty Laryngoscope Size: Glidescope and 3 Grade View: Grade I Tube size: 7.5 mm Number of attempts: 1 Airway  Equipment and Method: Video-laryngoscopy and Rigid stylet Placement Confirmation: ETT inserted through vocal cords under direct vision, CO2 detector and Breath sounds checked- equal and bilateral Secured at: 22 cm Tube secured  with: ETT holder Dental Injury: Teeth and Oropharynx as per pre-operative assessment  Difficulty Due To: Difficulty was unanticipated Future Recommendations: Recommend- induction with short-acting agent, and alternative techniques readily available      Medications Ordered in ED Medications  ipratropium-albuterol (DUONEB) 0.5-2.5 (3) MG/3ML nebulizer solution 3 mL (3 mLs Nebulization Given by Other 07/29/21 0745)  ceFEPIme (MAXIPIME) 2 g in sodium chloride 0.9 % 100 mL IVPB (has no administration in time range)  vancomycin (VANCOCIN) IVPB 1000 mg/200 mL premix (1,000 mg Intravenous New Bag/Given 07/29/21 1007)  acetaminophen (TYLENOL) tablet 1,000 mg (has no administration in time range)  fentaNYL (SUBLIMAZE) injection 25 mcg (has no administration in time range)  fentaNYL 2598mcg in NS 225mL (60mcg/ml) infusion-PREMIX (has no administration in time range)  fentaNYL (SUBLIMAZE) bolus via infusion 25-100 mcg (has no administration in time range)  iohexol (OMNIPAQUE) 350 MG/ML injection 100 mL (100 mLs Intravenous Contrast Given 07/29/21 0849)  methylPREDNISolone sodium succinate (SOLU-MEDROL) 125 mg/2 mL injection 125 mg (125 mg Intravenous Given 07/29/21 0950)  albuterol (PROVENTIL) (2.5 MG/3ML) 0.083% nebulizer solution 5 mg (5 mg Nebulization Given 07/29/21 1009)  ipratropium (ATROVENT) nebulizer solution 0.5 mg (0.5 mg Nebulization Given 07/29/21 1009)  etomidate (AMIDATE) injection 25 mg (25 mg Intravenous Given by Other 07/29/21 1033)  rocuronium (ZEMURON) injection 80 mg (80 mg Intravenous Given by Other 07/29/21 1034)    ED Course  I have reviewed the triage vital signs and the nursing notes.  Pertinent labs & imaging results that were available during my  care of the patient were reviewed by me and considered in my medical decision making (see chart for details).    MDM Rules/Calculators/A&P                           Harry Norris is here with shortness of breath and cough.  History of COPD, cardiomyopathy, hypertension.  Currently on antibiotics and steroids that were started earlier this week.  He is on day 2 of antibiotics.  He has been on several rounds of antibiotics since hospital admission few weeks ago.  Continues with intermittent shortness of breath.  He is on his home 2 L of oxygen.  He has diminished breath sounds throughout with wheezing.  He has some mild leg swelling.  No chest pain.  We will give breathing treatment.  We will check for ACS, heart failure, pneumonia.  Will consider CT scan of chest to rule out PE.  Overal suspect COPD exacerbation as well.  Will give breathing treatment, IV Solu-Medrol.  Influenza is positive.  BNP and troponin mildly elevated.  CT scan shows no blood clot but does show some persistent pneumonia.  We will start him back on IV antibiotics and broaden coverage given recent hospital admission for the same.  Given ongoing illness for the last 2 weeks does not appear to be a candidate for Tamiflu but suspect influenza fairly recent and likely causing shortness of breath symptoms of COPD exacerbation.  He did have negative flu testing while in the hospital recently.  Will admit to medicine for further care.  While patient was awaiting admission respiratory status got worse.  Became more sleepy and obtunded was not safe for BiPAP.  Patient is DNR and would not want CPR but family was okay with intubation and will continue to discuss ongoing goals of care.  We will admit him to ICU.  11:26 AM radiology called me on the phone and thinks  there may be a tiny apical pneumothorax on chest x-ray after intubation.  He remains hemodynamically stable.  Arterial blood gas shows a pH of 7.2 and CO2 of 72.  Ventilator has  been adjusted accordingly.  We will continue to monitor for any type of hemodynamic compromise.  Family was made aware of this.  ICU team made aware of this as well.  This chart was dictated using voice recognition software.  Despite best efforts to proofread,  errors can occur which can change the documentation meaning.    Final Clinical Impression(s) / ED Diagnoses Final diagnoses:  HAP (hospital-acquired pneumonia)  Influenza A  COPD exacerbation ([place])  Acute respiratory failure with hypoxia [facility])    Rx / DC Orders ED Discharge Orders     None        Lennice Sites, DO 07/29/21 [place], [place], DO 07/29/21 [place], [place], DO 07/29/21 1126

## 2021-07-29 NOTE — Progress Notes (Signed)
Patient woke up, followed commands x 4, fine to re-sedate.

## 2021-07-29 NOTE — ED Notes (Signed)
Diprovan being held due to BP.

## 2021-07-29 NOTE — ED Triage Notes (Signed)
Pt c/o cough and shob that has been waxing and waning x 2 week since discharge from hospital.

## 2021-07-29 NOTE — ED Notes (Signed)
Continuous monitoring; attempting to balance sedation level with BP requiring frequent titration and BP q 47min.

## 2021-07-29 NOTE — Progress Notes (Signed)
ETT advanced 4cm per MD order. ETT now placed at 26@ lip.

## 2021-07-29 NOTE — Progress Notes (Addendum)
Secure chatted with Caryl Pina RN regarding consult received for placing an US guided PIV. Caryl Pina stated, "I'm going to probably have his Levo off soon." Instructed to re-consult IV team if continuing the Levo. Fran Lowes, RN VAST

## 2021-07-29 NOTE — Progress Notes (Signed)
RT assisted MD with intubation. 1 attempt made = 7.5 @ 22 at the lip. Placed on 8cc VT amd a rate of 16 originally. Changed to a rate of 20 per ABG. MD aware. RT to monitor as needed

## 2021-07-29 NOTE — ED Notes (Signed)
Pt. Has been minimum of  1:1 care with RN &/or RT  at bedside since 10:00am.   Pt. Began with feeling more short of breath, only minimal relief from Inhaler or HHN.  HR and BP increased with patient showing signs of distress and fatigue.  Code status and Care wishes discussed with son by Dr. Ronnald Nian.  Son, Ramar Nobrega II, agreeable to plan of care for intubation but does not want CPR if his heart were to stop.  Pt.  Intubated by Dr. Ronnald Nian.  OGT & Foley placed.  Fentanyl titrated for sedation per orders.   Continues to be monitored closely

## 2021-07-30 ENCOUNTER — Inpatient Hospital Stay (HOSPITAL_COMMUNITY): Payer: Medicare Other

## 2021-07-30 DIAGNOSIS — L8990 Pressure ulcer of unspecified site, unspecified stage: Secondary | ICD-10-CM

## 2021-07-30 DIAGNOSIS — J449 Chronic obstructive pulmonary disease, unspecified: Secondary | ICD-10-CM | POA: Diagnosis not present

## 2021-07-30 DIAGNOSIS — J9621 Acute and chronic respiratory failure with hypoxia: Secondary | ICD-10-CM | POA: Diagnosis not present

## 2021-07-30 DIAGNOSIS — I5031 Acute diastolic (congestive) heart failure: Secondary | ICD-10-CM

## 2021-07-30 DIAGNOSIS — J9622 Acute and chronic respiratory failure with hypercapnia: Secondary | ICD-10-CM | POA: Diagnosis not present

## 2021-07-30 DIAGNOSIS — E1159 Type 2 diabetes mellitus with other circulatory complications: Secondary | ICD-10-CM | POA: Diagnosis not present

## 2021-07-30 HISTORY — DX: Pressure ulcer of unspecified site, unspecified stage: L89.90

## 2021-07-30 LAB — BASIC METABOLIC PANEL
Anion gap: 10 (ref 5–15)
BUN: 49 mg/dL — ABNORMAL HIGH (ref 8–23)
CO2: 22 mmol/L (ref 22–32)
Calcium: 8.5 mg/dL — ABNORMAL LOW (ref 8.9–10.3)
Chloride: 101 mmol/L (ref 98–111)
Creatinine, Ser: 1.59 mg/dL — ABNORMAL HIGH (ref 0.61–1.24)
GFR, Estimated: 43 mL/min — ABNORMAL LOW (ref >60)
Glucose, Bld: 164 mg/dL — ABNORMAL HIGH (ref 70–99)
Potassium: 4.6 mmol/L (ref 3.5–5.1)
Sodium: 133 mmol/L — ABNORMAL LOW (ref 135–145)

## 2021-07-30 LAB — GLUCOSE, CAPILLARY
Glucose-Capillary: 163 mg/dL — ABNORMAL HIGH (ref 70–99)
Glucose-Capillary: 141 mg/dL — ABNORMAL HIGH (ref 70–99)
Glucose-Capillary: 101 mg/dL — ABNORMAL HIGH (ref 70–99)
Glucose-Capillary: 117 mg/dL — ABNORMAL HIGH (ref 70–99)
Glucose-Capillary: 161 mg/dL — ABNORMAL HIGH (ref 70–99)
Glucose-Capillary: 156 mg/dL — ABNORMAL HIGH (ref 70–99)

## 2021-07-30 LAB — ECHOCARDIOGRAM COMPLETE
Area-P 1/2: 3.34 cm2
Calc EF: 25.1 %
Height: 69 in
S' Lateral: 4.7 cm
Single Plane A2C EF: 27.2 %
Single Plane A4C EF: 25 %
Weight: 2797.2 oz

## 2021-07-30 LAB — MAGNESIUM: Magnesium: 2.1 mg/dL (ref 1.7–2.4)

## 2021-07-30 LAB — PHOSPHORUS: Phosphorus: 4.6 mg/dL (ref 2.5–4.6)

## 2021-07-30 LAB — TRIGLYCERIDES: Triglycerides: 150 mg/dL — ABNORMAL HIGH (ref <150)

## 2021-07-30 MED ORDER — CEFDINIR 300 MG PO CAPS
300.0000 mg | ORAL_CAPSULE | Freq: Two times a day (BID) | ORAL | Status: DC
  Filled 2021-07-30: qty 1

## 2021-07-30 MED ORDER — SODIUM CHLORIDE 0.9 % IV SOLN
1.0000 g | INTRAVENOUS | Status: DC
  Administered 2021-07-30 – 2021-08-02 (×4): 1 g via INTRAVENOUS
  Filled 2021-07-30 (×5): qty 10

## 2021-07-30 MED ORDER — ASPIRIN 325 MG PO TABS
325.0000 mg | ORAL_TABLET | Freq: Every day | ORAL | Status: DC
  Administered 2021-07-31 – 2021-08-03 (×4): 325 mg via ORAL
  Filled 2021-07-30 (×4): qty 1

## 2021-07-30 MED ORDER — AZITHROMYCIN 500 MG PO TABS
500.0000 mg | ORAL_TABLET | Freq: Every day | ORAL | Status: DC
  Filled 2021-07-30: qty 1

## 2021-07-30 MED ORDER — OSELTAMIVIR PHOSPHATE 30 MG PO CAPS
30.0000 mg | ORAL_CAPSULE | Freq: Two times a day (BID) | ORAL | Status: AC
  Administered 2021-07-31 – 2021-08-02 (×6): 30 mg via ORAL
  Filled 2021-07-30 (×9): qty 1

## 2021-07-30 MED ORDER — DEXMEDETOMIDINE HCL IN NACL 400 MCG/100ML IV SOLN
0.4000 ug/kg/h | INTRAVENOUS | Status: DC
  Administered 2021-07-30: 1 ug/kg/h via INTRAVENOUS
  Administered 2021-07-30: 0.4 ug/kg/h via INTRAVENOUS
  Administered 2021-07-30: 1 ug/kg/h via INTRAVENOUS
  Filled 2021-07-30 (×4): qty 100

## 2021-07-30 MED ORDER — POLYETHYLENE GLYCOL 3350 17 G PO PACK: 17.0000 g | PACK | Freq: Every day | ORAL | Status: DC

## 2021-07-30 MED ORDER — METHYLPREDNISOLONE SODIUM SUCC 125 MG IJ SOLR
40.0000 mg | Freq: Every day | INTRAMUSCULAR | Status: AC
  Administered 2021-07-30 – 2021-07-31 (×2): 40 mg via INTRAVENOUS
  Filled 2021-07-30 (×2): qty 2

## 2021-07-30 MED ORDER — METHYLPREDNISOLONE SODIUM SUCC 40 MG IJ SOLR
20.0000 mg | Freq: Every day | INTRAMUSCULAR | Status: AC
  Administered 2021-08-01 – 2021-08-02 (×2): 20 mg via INTRAVENOUS
  Filled 2021-07-30: qty 1
  Filled 2021-07-30: qty 0.5

## 2021-07-30 MED ORDER — OSELTAMIVIR PHOSPHATE 30 MG PO CAPS
30.0000 mg | ORAL_CAPSULE | Freq: Two times a day (BID) | ORAL | Status: DC
  Filled 2021-07-30: qty 1

## 2021-07-30 MED ORDER — CEFDINIR 300 MG PO CAPS
300.0000 mg | ORAL_CAPSULE | Freq: Two times a day (BID) | ORAL | Status: DC
  Administered 2021-07-30: 300 mg
  Filled 2021-07-30 (×2): qty 1

## 2021-07-30 MED ORDER — AZITHROMYCIN 500 MG PO TABS: 500.0000 mg | ORAL_TABLET | Freq: Every day | ORAL | Status: DC

## 2021-07-30 MED ORDER — BUDESONIDE 0.25 MG/2ML IN SUSP
RESPIRATORY_TRACT | Status: AC
  Administered 2021-07-30: 0.25 mg
  Filled 2021-07-30: qty 2

## 2021-07-30 MED ORDER — METHYLPREDNISOLONE SODIUM SUCC 125 MG IJ SOLR
125.0000 mg | Freq: Once | INTRAMUSCULAR | Status: AC
  Administered 2021-07-30: 125 mg via INTRAVENOUS

## 2021-07-30 MED ORDER — ACETAMINOPHEN 160 MG/5ML PO SOLN
650.0000 mg | ORAL | Status: DC | PRN
  Administered 2021-07-30: 650 mg
  Filled 2021-07-30: qty 20.3

## 2021-07-30 MED ORDER — FENTANYL CITRATE (PF) 100 MCG/2ML IJ SOLN: 100.0000 ug | Freq: Once | INTRAMUSCULAR | Status: DC

## 2021-07-30 MED ORDER — ACETAMINOPHEN 325 MG PO TABS
650.0000 mg | ORAL_TABLET | ORAL | Status: DC | PRN
  Administered 2021-08-01 – 2021-08-03 (×3): 650 mg via ORAL
  Filled 2021-07-30 (×3): qty 2

## 2021-07-30 MED ORDER — DOCUSATE SODIUM 100 MG PO CAPS
100.0000 mg | ORAL_CAPSULE | Freq: Two times a day (BID) | ORAL | Status: DC
  Administered 2021-08-01 – 2021-08-03 (×4): 100 mg via ORAL
  Filled 2021-07-30 (×4): qty 1

## 2021-07-30 MED ORDER — IPRATROPIUM-ALBUTEROL 0.5-2.5 (3) MG/3ML IN SOLN
3.0000 mL | RESPIRATORY_TRACT | Status: DC
Start: 2021-07-30 — End: 2021-07-30
  Administered 2021-07-30 (×3): 3 mL via RESPIRATORY_TRACT
  Filled 2021-07-30 (×3): qty 3

## 2021-07-30 MED ORDER — SODIUM CHLORIDE 0.9 % IV SOLN
500.0000 mg | INTRAVENOUS | Status: AC
  Administered 2021-07-30 – 2021-07-31 (×2): 500 mg via INTRAVENOUS
  Filled 2021-07-30 (×2): qty 500

## 2021-07-30 MED ORDER — FENTANYL CITRATE (PF) 100 MCG/2ML IJ SOLN
25.0000 ug | Freq: Once | INTRAMUSCULAR | Status: AC
  Administered 2021-07-30: 25 ug via INTRAVENOUS

## 2021-07-30 MED ORDER — ALBUTEROL SULFATE (2.5 MG/3ML) 0.083% IN NEBU
2.5000 mg | INHALATION_SOLUTION | Freq: Three times a day (TID) | RESPIRATORY_TRACT | Status: DC
  Administered 2021-07-30 – 2021-08-01 (×5): 2.5 mg via RESPIRATORY_TRACT
  Filled 2021-07-30 (×5): qty 3

## 2021-07-30 MED ORDER — ALBUTEROL SULFATE (2.5 MG/3ML) 0.083% IN NEBU
2.5000 mg | INHALATION_SOLUTION | RESPIRATORY_TRACT | Status: DC | PRN
  Administered 2021-07-30: 2.5 mg via RESPIRATORY_TRACT
  Filled 2021-07-30: qty 3

## 2021-07-30 NOTE — Progress Notes (Signed)
Goals of care conversation with patient's two sons and daughter-in-law. They would like to pursue BiPAP +/- nasal cannula as tolerated with least amount of medications to control agitation but DO NOT want the patient intubated if his respiratory status became emergent and required further intervention. If his level of agitation becomes greater than can be safely controlled they wish to be contacted so that they can come in and see him as again they DO NOT want emergent intubation for the patient.  Son--Travis--[phone] Son--Ronald--[phone]

## 2021-07-30 NOTE — Procedures (Signed)
Extubation Procedure Note  Patient Details:   Name: Harry Norris DOB: 1936/07/10 MRN: [id number]   Airway Documentation:    Vent end date: 07/30/21 Vent end time: 0840   Evaluation  O2 sats: stable throughout Complications: No apparent complications Patient did tolerate procedure well. Bilateral Breath Sounds: Clear, Diminished   Patient extubated per MD order & placed on 4L . Patient able to speak & has good strong cough post extubation. Will continue to monitor.  Kathie Dike 07/30/2021, 8:45 AM

## 2021-07-30 NOTE — Progress Notes (Signed)
  Echocardiogram 2D Echocardiogram has been performed.  Harry Norris 07/30/2021, 11:23 AM

## 2021-07-30 NOTE — Progress Notes (Signed)
NAME:  Harry Norris, MRN:  [id number], DOB:  11/12/1935, LOS: 1 ADMISSION DATE:  07/29/2021, CONSULTATION DATE:  11/29 REFERRING MD:  Ronnald Nian, CHIEF COMPLAINT:  SOB   History of Present Illness:  Harry Norris is a 85 y.o. M who presented to [facility] complaining of congestion, dyspnea, and wheezing. Of note he was hospitalized 11/12 for pneumonia and these symptoms are recurrent. On initial ED presentation he had improvement of respiratory symptoms initially and was found to be positive for flu however did become less responsive requiring emergent sedation and intubation. He was transferred to [facility] for further management.  Pertinent  Medical History  Cardiomyopathy with EF 35%, HTN, HLD, COPD, chronic rhinitis  Significant Hospital Events: Including procedures, antibiotic start and stop dates in addition to other pertinent events   11/29 [facility] admitted, intubated, and transferred to [facility] 11/30 Extubated  Interim History / Subjective:  Patient tolerating weaning trial well. He is alert and engaging. We will extubate and monitor how he tolerates this.  Objective   Blood pressure (!) 132/57, pulse (!) 104, temperature 99.1 F (37.3 C), temperature source Oral, resp. rate 17, height 5\' 9"  (1.753 m), weight 79.3 kg, SpO2 (!) 89 %.    Vent Mode: CPAP;PSV FiO2 (%):  [40 %-100 %] 40 % Set Rate:  [16 bmp-20 bmp] 20 bmp Vt Set:  [560 mL] 560 mL PEEP:  [5 cmH20] 5 cmH20 Pressure Support:  [10 cmH20] 10 cmH20 Plateau Pressure:  [20 cmH20-22 cmH20] 20 cmH20   Intake/Output Summary (Last 24 hours) at 07/30/2021 0920 Last data filed at 07/30/2021 0900 Gross per 24 hour  Intake 1275.66 ml  Output 865 ml  Net 410.66 ml   Filed Weights   07/29/21 0553 07/30/21 0500  Weight: 79.4 kg 79.3 kg    Examination: Constitutional: Elderly gentleman resting in bed, no acute distress. Cardio: Regular rate and rhythm. No murmurs, rubs, gallops. Pulm: Clear to auscultation bilaterally.  Abdomen:  Protuberant, soft, non-tender. MSK: No extremity edema noted. Skin: Warm and dry.  Resolved Hospital Problem list   Encephalopathy  Assessment & Plan:  Acute on chronic hypoxemic respiratory failure Acute on chronic hypercapnic respiratory failure - Will trial patient off of ventilator - Tamiflu - Nebulizers - Steroid taper - Start Zithromax 500 mg daily for 3 days, omnicef 300 mg BID for 5 days.  Acute kidney injury Slight bump of Cr 1.20>1.59. - Encourage PO intake - Trend BMP  Best Practice (right click and "Reselect all SmartList Selections" daily)   Diet/type: NPO DVT prophylaxis: LMWH GI prophylaxis: PPI Lines: N/A Foley:  Yes, and it is still needed Code Status:  DNR Last date of multidisciplinary goals of care discussion [11/29]  Labs   CBC: Recent Labs  Lab 07/29/21 0820 07/29/21 1105 07/29/21 1728  WBC 11.7*  --   --   NEUTROABS 9.6*  --   --   HGB 11.4* 13.6 11.9*  HCT 36.0* 40.0 35.0*  MCV 94.5  --   --   PLT 237  --   --     Basic Metabolic Panel: Recent Labs  Lab 07/29/21 0742 07/29/21 1105 07/29/21 1728 07/30/21 0605  NA 133* 134* 134* 133*  K 5.0 4.7 5.6* 4.6  CL 99  --   --  101  CO2 27  --   --  22  GLUCOSE 97  --   --  164*  BUN 39*  --   --  49*  CREATININE 1.20  --   --  1.59*  CALCIUM 9.1  --   --  8.5*  MG  --   --   --  2.1  PHOS  --   --   --  4.6   GFR: Estimated Creatinine Clearance: 35.2 mL/min (A) (by C-G formula based on SCr of 1.59 mg/dL (H)). Recent Labs  Lab 07/29/21 0820  PROCALCITON <0.10  WBC 11.7*    Liver Function Tests: No results for input(s): AST, ALT, ALKPHOS, BILITOT, PROT, ALBUMIN in the last 168 hours. No results for input(s): LIPASE, AMYLASE in the last 168 hours. No results for input(s): AMMONIA in the last 168 hours.  ABG    Component Value Date/Time   PHART 7.355 07/29/2021 1728   PCO2ART 41.9 07/29/2021 1728   PO2ART 98 07/29/2021 1728   HCO3 23.4 07/29/2021 1728   TCO2 25 07/29/2021  1728   ACIDBASEDEF 2.0 07/29/2021 1728   O2SAT 97.0 07/29/2021 1728     Coagulation Profile: No results for input(s): INR, PROTIME in the last 168 hours.  Cardiac Enzymes: No results for input(s): CKTOTAL, CKMB, CKMBINDEX, TROPONINI in the last 168 hours.  HbA1C: No results found for: HGBA1C  CBG: Recent Labs  Lab 07/29/21 1530 07/29/21 1912 07/29/21 2307 07/30/21 0310 07/30/21 0715  GLUCAP 173* 168* 171* 163* 141*    Review of Systems:   Review of Systems  Unable to perform ROS: Intubated    Past Medical History:  He,  has a past medical history of AAA (abdominal aortic aneurysm), Anemia, Asthma (02/07/2018), Benign neoplasm of colon, Bradycardia (12/19/2018), Bronchitis, Cancer ([place]), Cardiomyopathy ([place]) (12/18/2015), Carotid artery occlusion, Chronic rhinitis (02/26/2016), Chronic venous insufficiency (12/18/2015), COPD with emphysema Gold C  (02/27/2014), Diverticulosis of colon (without mention of hemorrhage), DM type 2 (diabetes mellitus, type 2) ([place]), Fatigue (12/12/2018), Hyperlipidemia, Hypertension, Hypertensive heart disease, Hypoxemia (04/10/2014), Kidney stone, LBBB (left bundle branch block) (12/18/2015), Leg swelling (11/28/2013), Orthostatic hypotension (12/19/2018), Other primary cardiomyopathies, Other symptoms involving cardiovascular system (11/28/2013), Prostate cancer ([place]), Syncope (12/18/2015), Unspecified venous (peripheral) insufficiency, Varicose veins, and Varicose veins of lower extremities with other complications (03/15/9677).   Surgical History:   Past Surgical History:  Procedure Laterality Date    cardiac catheterization  may 2011   CATARACT EXTRACTION Bilateral    ENDOVENOUS ABLATION SAPHENOUS VEIN W/ LASER Right 03-29-2014   EVLA right greater saphenous vein     gold seed implants  2011   treatment of prostate cancer   HERNIA REPAIR Bilateral    LITHOTRIPSY  2010   right ear surgery       Social History:   reports that he quit smoking about 21  years ago. His smoking use included cigarettes. He has a 100.00 pack-year smoking history. He has never used smokeless tobacco. He reports that he does not drink alcohol and does not use drugs.   Family History:  His family history includes Asthma in his sister; Cancer - Colon in his mother; Diabetes in his mother; Emphysema in his sister; Heart disease in his sister; Heart failure in his father; Hypertension in his mother; Kidney failure in his father.   Allergies Allergies  Allergen Reactions   Ace Inhibitors Cough     Home Medications  Prior to Admission medications   Medication Sig Start Date End Date Taking? Authorizing Provider  albuterol (VENTOLIN HFA) 108 (90 Base) MCG/ACT inhaler Inhale 2 puffs into the lungs every 6 (six) hours as needed for wheezing or shortness of breath.   Yes [provider]  aspirin EC  81 MG tablet Take 81 mg by mouth daily. Swallow whole.   Yes [provider]  atorvastatin (LIPITOR) 40 MG tablet Take 40 mg by mouth at bedtime.   Yes [provider]  carvedilol (COREG) 12.5 MG tablet Take 12.5 mg by mouth in the morning and at bedtime.   Yes [provider]  fenofibrate 160 MG tablet Take 160 mg by mouth daily.   Yes [provider]  formoterol (PERFOROMIST) 20 MCG/2ML nebulizer solution Take 20 mcg by nebulization 2 (two) times daily.   Yes [provider]  furosemide (LASIX) 40 MG tablet Take 40 mg by mouth in the morning.   Yes [provider]  levofloxacin (LEVAQUIN) 750 MG tablet Take 750 mg by mouth daily. 07/25/21 08/04/21 Yes [provider]  losartan (COZAAR) 100 MG tablet Take 100 mg by mouth daily.   Yes [provider]  Multiple Vitamins-Minerals (CENTRUM SILVER 50+MEN) TABS Take 1 tablet by mouth daily with breakfast.   Yes [provider]  naproxen (NAPROSYN) 500 MG tablet Take 500 mg by mouth daily as needed (for pain).   Yes [provider]   Omega-3 Fatty Acids (FISH OIL) 1000 MG CAPS Take 1,000 mg by mouth 2 (two) times daily.   Yes [provider]  OXYGEN Inhale 2 L/min into the lungs as needed (for shortness of breath).   Yes [provider]  oxymetazoline (AFRIN) 0.05 % nasal spray Place 1 spray into both nostrils 2 (two) times daily as needed for congestion.   Yes [provider]  predniSONE (DELTASONE) 10 MG tablet Take 10-60 mg by mouth See admin instructions. Take 60 mg by mouth once a day for 3 days, 40 mg once a day for 3 days, 20 mg once a day for 3 days, then decrease to 10 mg once a day until completed 07/25/21  Yes [provider]  vitamin C (ASCORBIC ACID) 500 MG tablet Take 500 mg by mouth daily.   Yes [provider]  vitamin E 400 UNIT capsule Take 400 Units by mouth daily.   Yes [provider]  budesonide (PULMICORT) 180 MCG/ACT inhaler Inhale 2 puffs into the lungs 2 (two) times daily. Patient not taking: Reported on 07/29/2021    [provider]  Glycopyrrolate-Formoterol (BEVESPI AEROSPHERE) 9-4.8 MCG/ACT AERO Inhale 2 puffs into the lungs daily. Patient not taking: Reported on 07/29/2021    [provider]  IPRATROPIUM BROMIDE HFA IN Inhale 0.5 mg into the lungs daily as needed. Patient not taking: Reported on 07/29/2021    [provider]     Critical care time: 35 minutes

## 2021-07-31 DIAGNOSIS — J111 Influenza due to unidentified influenza virus with other respiratory manifestations: Secondary | ICD-10-CM

## 2021-07-31 DIAGNOSIS — J9622 Acute and chronic respiratory failure with hypercapnia: Secondary | ICD-10-CM | POA: Diagnosis not present

## 2021-07-31 DIAGNOSIS — J9621 Acute and chronic respiratory failure with hypoxia: Secondary | ICD-10-CM | POA: Diagnosis not present

## 2021-07-31 LAB — GLUCOSE, CAPILLARY
Glucose-Capillary: 97 mg/dL (ref 70–99)
Glucose-Capillary: 73 mg/dL (ref 70–99)
Glucose-Capillary: 110 mg/dL — ABNORMAL HIGH (ref 70–99)
Glucose-Capillary: 133 mg/dL — ABNORMAL HIGH (ref 70–99)
Glucose-Capillary: 111 mg/dL — ABNORMAL HIGH (ref 70–99)
Glucose-Capillary: 73 mg/dL (ref 70–99)

## 2021-07-31 LAB — BASIC METABOLIC PANEL
Anion gap: 10 (ref 5–15)
BUN: 44 mg/dL — ABNORMAL HIGH (ref 8–23)
CO2: 24 mmol/L (ref 22–32)
Calcium: 8.6 mg/dL — ABNORMAL LOW (ref 8.9–10.3)
Chloride: 102 mmol/L (ref 98–111)
Creatinine, Ser: 1.22 mg/dL (ref 0.61–1.24)
GFR, Estimated: 59 mL/min — ABNORMAL LOW (ref >60)
Glucose, Bld: 147 mg/dL — ABNORMAL HIGH (ref 70–99)
Potassium: 5.1 mmol/L (ref 3.5–5.1)
Sodium: 136 mmol/L (ref 135–145)

## 2021-07-31 LAB — PHOSPHORUS: Phosphorus: 4.7 mg/dL — ABNORMAL HIGH (ref 2.5–4.6)

## 2021-07-31 LAB — MAGNESIUM: Magnesium: 2.4 mg/dL (ref 1.7–2.4)

## 2021-07-31 MED ORDER — WHITE PETROLATUM EX OINT
TOPICAL_OINTMENT | CUTANEOUS | Status: AC
  Filled 2021-07-31: qty 28.35

## 2021-07-31 MED ORDER — INSULIN ASPART 100 UNIT/ML IJ SOLN
0.0000 [IU] | INTRAMUSCULAR | Status: DC
  Administered 2021-07-31 – 2021-08-03 (×3): 1 [IU] via SUBCUTANEOUS

## 2021-07-31 MED ORDER — PHENOL 1.4 % MT LIQD
1.0000 | OROMUCOSAL | Status: DC | PRN
  Filled 2021-07-31: qty 177

## 2021-07-31 MED ORDER — ACETAMINOPHEN 650 MG RE SUPP
650.0000 mg | RECTAL | Status: DC | PRN
  Filled 2021-07-31: qty 1

## 2021-07-31 MED ORDER — FENTANYL CITRATE (PF) 100 MCG/2ML IJ SOLN
INTRAMUSCULAR | Status: AC
  Filled 2021-07-31: qty 2

## 2021-07-31 MED ORDER — ZOLPIDEM TARTRATE 5 MG PO TABS
5.0000 mg | ORAL_TABLET | Freq: Every evening | ORAL | Status: DC | PRN
  Administered 2021-07-31 – 2021-08-02 (×3): 5 mg via ORAL
  Filled 2021-07-31 (×3): qty 1

## 2021-07-31 MED ORDER — FENTANYL CITRATE (PF) 100 MCG/2ML IJ SOLN
25.0000 ug | INTRAMUSCULAR | Status: DC | PRN
  Administered 2021-07-31: 25 ug via INTRAVENOUS

## 2021-07-31 MED ORDER — METHYLPREDNISOLONE SODIUM SUCC 125 MG IJ SOLR
INTRAMUSCULAR | Status: AC
  Filled 2021-07-31: qty 2

## 2021-07-31 MED ORDER — GUAIFENESIN ER 600 MG PO TB12
600.0000 mg | ORAL_TABLET | Freq: Two times a day (BID) | ORAL | Status: DC
  Administered 2021-07-31 – 2021-08-03 (×6): 600 mg via ORAL
  Filled 2021-07-31 (×8): qty 1

## 2021-07-31 NOTE — Progress Notes (Signed)
NAME:  Harry Norris, MRN:  [id number], DOB:  03/27/1936, LOS: 2 ADMISSION DATE:  07/29/2021, CONSULTATION DATE:  11/29 REFERRING MD:  Ronnald Nian, CHIEF COMPLAINT:  SOB   History of Present Illness:  Harry Norris is a 85 y.o. M with hx COPD, cardiomyopathy with EF 35%, HTN who presented to [facility] 11/29 complaining of congestion, dyspnea, and wheezing. On initial ED presentation he had improvement of respiratory symptoms initially and was found to be positive for flu however did become less responsive requiring emergent sedation and intubation. He was transferred to [facility] for further management.  Pertinent  Medical History  Cardiomyopathy with EF 35%, HTN, HLD, COPD, chronic rhinitis  Significant Hospital Events: Including procedures, antibiotic start and stop dates in addition to other pertinent events   11/29 [facility] admitted, intubated, and transferred to [facility] 11/29>> FLU A POS 11/30 Extubated  Interim History / Subjective:  Extubated yesterday. Initially did well then became very SOB with activity getting OOB and required bipap. Off bipap since 4am, doing well on HFNC, denies SOB.   Objective   Blood pressure 139/64, pulse 80, temperature 98.3 F (36.8 C), temperature source Oral, resp. rate (!) 22, height 5\' 9"  (1.753 m), weight 74.3 kg, SpO2 100 %.    Vent Mode: PCV;BIPAP FiO2 (%):  [40 %-99 %] 99 % Set Rate:  [10 bmp] 10 bmp PEEP:  [5 cmH20] 5 cmH20 Pressure Support:  [10 cmH20] 10 cmH20 Plateau Pressure:  [8 cmH20] 8 cmH20   Intake/Output Summary (Last 24 hours) at 07/31/2021 1010 Last data filed at 07/31/2021 0900 Gross per 24 hour  Intake 398.92 ml  Output 1260 ml  Net -861.08 ml   Filed Weights   07/29/21 0553 07/30/21 0500 07/31/21 0230  Weight: 79.4 kg 79.3 kg 74.3 kg    Examination: Constitutional: Elderly gentleman resting in bed, no acute distress. Cardio: s1s2 rrr Pulm: resps even non labored on [place], Clear to auscultation bilaterally.  Abdomen: Protuberant,  soft, non-tender. MSK: No extremity edema noted. Skin: Warm and dry.  Resolved Hospital Problem list   Encephalopathy  Assessment & Plan:  Acute on chronic hypoxemic and hypercapnic respiratory failure Flu A Hx COPD  PLAN -  Pulmonary hygiene  Mobilize  Wean supplemental O2 as able to keep sats >92%  Continue tamiflu Continue IV steroids - taper in place  Bipap PRN  Zithromax 500 mg daily for 3 days, omnicef 300 mg BID for 5 days.  Cardiomyopathy - EF 35%  HTN Monitor volume status  ASA Holding home coreg, lasix, losartan for now (just off pressors)  Acute kidney injury Slight bump of Cr 1.20>1.59. - Encourage PO intake - Trend BMP Monitor in ICU today, if continues to improve can likely tx tele this afternoon   Best Practice (right click and "Reselect all SmartList Selections" daily)   Diet/type: Regular consistency (see orders) DVT prophylaxis: LMWH GI prophylaxis: PPI Lines: N/A Foley:  Yes, and it is still needed Code Status:  DNR Last date of multidisciplinary goals of care discussion [11/29]  Labs   CBC: Recent Labs  Lab 07/29/21 0820 07/29/21 1105 07/29/21 1728  WBC 11.7*  --   --   NEUTROABS 9.6*  --   --   HGB 11.4* 13.6 11.9*  HCT 36.0* 40.0 35.0*  MCV 94.5  --   --   PLT 237  --   --     Basic Metabolic Panel: Recent Labs  Lab 07/29/21 0742 07/29/21 1105 07/29/21 1728 07/30/21  5449 07/31/21 0153  NA 133* 134* 134* 133* 136  K 5.0 4.7 5.6* 4.6 5.1  CL 99  --   --  101 102  CO2 27  --   --  22 24  GLUCOSE 97  --   --  164* 147*  BUN 39*  --   --  49* 44*  CREATININE 1.20  --   --  1.59* 1.22  CALCIUM 9.1  --   --  8.5* 8.6*  MG  --   --   --  2.1 2.4  PHOS  --   --   --  4.6 4.7*   GFR: Estimated Creatinine Clearance: 45.9 mL/min (by C-G formula based on SCr of 1.22 mg/dL). Recent Labs  Lab 07/29/21 0820  PROCALCITON <0.10  WBC 11.7*    Liver Function Tests: No results for input(s): AST, ALT, ALKPHOS, BILITOT, PROT,  ALBUMIN in the last 168 hours. No results for input(s): LIPASE, AMYLASE in the last 168 hours. No results for input(s): AMMONIA in the last 168 hours.  ABG    Component Value Date/Time   PHART 7.355 07/29/2021 1728   PCO2ART 41.9 07/29/2021 1728   PO2ART 98 07/29/2021 1728   HCO3 23.4 07/29/2021 1728   TCO2 25 07/29/2021 1728   ACIDBASEDEF 2.0 07/29/2021 1728   O2SAT 97.0 07/29/2021 1728     Coagulation Profile: No results for input(s): INR, PROTIME in the last 168 hours.  Cardiac Enzymes: No results for input(s): CKTOTAL, CKMB, CKMBINDEX, TROPONINI in the last 168 hours.  HbA1C: No results found for: HGBA1C  CBG: Recent Labs  Lab 07/30/21 1515 07/30/21 1929 07/30/21 2321 07/31/21 0322 07/31/21 0738  GLUCAP 117* 161* 156* 97 73   Critical care time: 32 minutes   Nickolas Madrid, NP Pulmonary/Critical Care Medicine  07/31/2021  10:10 AM

## 2021-07-31 NOTE — Progress Notes (Addendum)
eLink Physician-Brief Progress Note Patient Name: Harry Norris DOB: 14-Apr-1936 MRN: [id number]   Date of Service  07/31/2021  HPI/Events of Note  Notified of hyperglycemia.  Pt is NPO.    Pt also complaining of pain all over.   eICU Interventions  Insulin sliding scale ordered q4hrs.  Tylenol PR ordered.     Intervention Category Intermediate Interventions: Pain - evaluation and management  Elsie Lincoln 07/31/2021, 12:34 AM  5:34 AM Pt complaining of sore throat.  He was extubated yesterday.   Plan> Chloraseptic spray PRN.

## 2021-07-31 NOTE — Progress Notes (Signed)
Parents remained stable  Oxygen requirement improving Alert and cooperative  Will transfer to [place]

## 2021-07-31 NOTE — Evaluation (Signed)
Clinical/Bedside Swallow Evaluation Patient Details  Name: Harry Norris MRN: [id number] Date of Birth: [birth year]  Today's Date: 07/31/2021 Time: SLP Start Time (ACUTE ONLY): 0930 SLP Stop Time (ACUTE ONLY): 0940 SLP Time Calculation (min) (ACUTE ONLY): 10 min  Past Medical History:  Past Medical History:  Diagnosis Date   AAA (abdominal aortic aneurysm)    Anemia    Asthma 02/07/2018   Benign neoplasm of colon    Bradycardia 12/19/2018   Bronchitis    Cancer ([place])    prostate   Cardiomyopathy ([place]) 12/18/2015   Carotid artery occlusion    Chronic rhinitis 02/26/2016   Chronic venous insufficiency 12/18/2015   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   PFTs 01/18/14:  FeV1 1.58 45% FVC 2.0 50% FeV1/FVC 79% Fef 25-75 795 ( 40% improved after BD) CXR: Copd changes ONO 03/07/14 desats to <88% on RA    Diverticulosis of colon (without mention of hemorrhage)    DM type 2 (diabetes mellitus, type 2) ([place])    Fatigue 12/12/2018   Hyperlipidemia    Hypertension    Hypertensive heart disease    Hypoxemia 04/10/2014   Overview:  Last Assessment & Plan:  Cont nocturnal oxygen therapy 2L    Kidney stone    LBBB (left bundle branch block) 12/18/2015   Leg swelling 11/28/2013   Orthostatic hypotension 12/19/2018   Other primary cardiomyopathies    Other symptoms involving cardiovascular system 11/28/2013   Prostate cancer ([place])    prostate    Syncope 12/18/2015   Unspecified venous (peripheral) insufficiency    Varicose veins    Varicose veins of lower extremities with other complications 03/06/2375   Past Surgical History:  Past Surgical History:  Procedure Laterality Date    cardiac catheterization  may 2011   CATARACT EXTRACTION Bilateral    ENDOVENOUS ABLATION SAPHENOUS VEIN W/ LASER Right 03-29-2014   EVLA right greater saphenous vein     gold seed implants  2011   treatment of prostate cancer   HERNIA REPAIR Bilateral    LITHOTRIPSY  2010   right ear surgery     HPI:  Harry R.  Norris is a 85 y.o. M who presented to [facility] complaining of congestion, dyspnea, and wheezing. Of note he was hospitalized 11/12 for pneumonia and these symptoms are recurrent. On initial ED presentation he had improvement of respiratory symptoms initially and was found to be positive for flu however did become less responsive requiring emergent sedation and intubation11/29-11/30. Otherwise on Bipap.    Assessment / Plan / Recommendation  Clinical Impression  Pt demonstrates no signs of dysphagia, tolerates liquids well. Reports no history of dysphagia.  Pts dentures are not present, but he is able to masticate well. His son will bring them later today. Will initaite a regular diet and thin liquids. SLP will sign off. SLP Visit Diagnosis: Dysphagia, unspecified (R13.10)    Aspiration Risk       Diet Recommendation Regular;Thin liquid   Liquid Administration via: Cup;Straw Medication Administration: Whole meds with liquid Supervision: Patient able to self feed    Other  Recommendations      Recommendations for follow up therapy are one component of a multi-disciplinary discharge planning process, led by the attending physician.  Recommendations may be updated based on patient status, additional functional criteria and insurance authorization.  Follow up Recommendations No SLP follow up      Assistance Recommended at Discharge    Functional Status Assessment    Frequency  and Duration            Prognosis        Swallow Study   General HPI: Harry Norris is a 85 y.o. M who presented to [facility] complaining of congestion, dyspnea, and wheezing. Of note he was hospitalized 11/12 for pneumonia and these symptoms are recurrent. On initial ED presentation he had improvement of respiratory symptoms initially and was found to be positive for flu however did become less responsive requiring emergent sedation and intubation11/29-11/30. Otherwise on Bipap. Type of Study: Bedside Swallow  Evaluation Previous Swallow Assessment: none Diet Prior to this Study: NPO Temperature Spikes Noted: No Respiratory Status: Nasal cannula History of Recent Intubation: Yes Length of Intubations (days): 2 days Date extubated: 07/30/21 Behavior/Cognition: Alert;Cooperative;Pleasant mood Oral Cavity Assessment: Within Functional Limits Oral Care Completed by SLP: No Oral Cavity - Dentition: Edentulous;Dentures, not available Vision: Functional for self-feeding Self-Feeding Abilities: Able to feed self Patient Positioning: Upright in bed Baseline Vocal Quality: Normal Volitional Cough: Strong Volitional Swallow: Able to elicit    Oral/Motor/Sensory Function Overall Oral Motor/Sensory Function: Within functional limits   Ice Chips     Thin Liquid Thin Liquid: Within functional limits Presentation: Cup    Nectar Thick Nectar Thick Liquid: Not tested   Honey Thick Honey Thick Liquid: Not tested   Puree Puree: Within functional limits   Solid     Solid: Within functional limits      Harry Norris, Katherene Ponto 07/31/2021,10:50 AM

## 2021-08-01 LAB — PHOSPHORUS: Phosphorus: 3 mg/dL (ref 2.5–4.6)

## 2021-08-01 LAB — GLUCOSE, CAPILLARY
Glucose-Capillary: 82 mg/dL (ref 70–99)
Glucose-Capillary: 57 mg/dL — ABNORMAL LOW (ref 70–99)
Glucose-Capillary: 61 mg/dL — ABNORMAL LOW (ref 70–99)
Glucose-Capillary: 63 mg/dL — ABNORMAL LOW (ref 70–99)
Glucose-Capillary: 69 mg/dL — ABNORMAL LOW (ref 70–99)
Glucose-Capillary: 77 mg/dL (ref 70–99)
Glucose-Capillary: 92 mg/dL (ref 70–99)
Glucose-Capillary: 116 mg/dL — ABNORMAL HIGH (ref 70–99)
Glucose-Capillary: 111 mg/dL — ABNORMAL HIGH (ref 70–99)
Glucose-Capillary: 96 mg/dL (ref 70–99)
Glucose-Capillary: 76 mg/dL (ref 70–99)

## 2021-08-01 LAB — BASIC METABOLIC PANEL
Anion gap: 8 (ref 5–15)
Anion gap: 9 (ref 5–15)
BUN: 37 mg/dL — ABNORMAL HIGH (ref 8–23)
BUN: 32 mg/dL — ABNORMAL HIGH (ref 8–23)
CO2: 24 mmol/L (ref 22–32)
CO2: 26 mmol/L (ref 22–32)
Calcium: 8.5 mg/dL — ABNORMAL LOW (ref 8.9–10.3)
Calcium: 8.4 mg/dL — ABNORMAL LOW (ref 8.9–10.3)
Chloride: 104 mmol/L (ref 98–111)
Chloride: 100 mmol/L (ref 98–111)
Creatinine, Ser: 0.95 mg/dL (ref 0.61–1.24)
Creatinine, Ser: 1.01 mg/dL (ref 0.61–1.24)
GFR, Estimated: >60 mL/min (ref >60)
GFR, Estimated: >60 mL/min (ref >60)
Glucose, Bld: 73 mg/dL (ref 70–99)
Glucose, Bld: 134 mg/dL — ABNORMAL HIGH (ref 70–99)
Potassium: 5.7 mmol/L — ABNORMAL HIGH (ref 3.5–5.1)
Potassium: 5.1 mmol/L (ref 3.5–5.1)
Sodium: 136 mmol/L (ref 135–145)
Sodium: 135 mmol/L (ref 135–145)

## 2021-08-01 LAB — MAGNESIUM: Magnesium: 2.2 mg/dL (ref 1.7–2.4)

## 2021-08-01 MED ORDER — HYDROCOD POLST-CPM POLST ER 10-8 MG/5ML PO SUER
5.0000 mL | Freq: Two times a day (BID) | ORAL | Status: DC | PRN
  Administered 2021-08-01: 5 mL via ORAL
  Filled 2021-08-01: qty 5

## 2021-08-01 NOTE — Progress Notes (Signed)
eLink Physician-Brief Progress Note Patient Name: Harry Norris DOB: 1936/04/11 MRN: [id number]   Date of Service  08/01/2021  HPI/Events of Note  Potassium 5.7 Creat normal No EKG changes  eICU Interventions  Repeat potassium 1400     Intervention Category Major Interventions: Electrolyte abnormality - evaluation and management  Mauri [place], P 08/01/2021, 6:18 AM

## 2021-08-01 NOTE — Progress Notes (Signed)
Pt transported to 5N in wheelchair by Nurse Tech on 2L Nasal Cannula, dentures with patient.

## 2021-08-01 NOTE — Progress Notes (Signed)
PROGRESS NOTE    Harry Norris  WCB:[id number] DOB: 01/21/1936 DOA: 07/29/2021 PCP: [name], MD   Brief Narrative:  Harry Norris is a 85 y.o. M with hx COPD, cardiomyopathy with EF 35%, HTN who presented to [facility] 11/29 complaining of congestion, dyspnea, and wheezing. On initial ED presentation he had improvement of respiratory symptoms initially and was found to be positive for flu however did become less responsive requiring emergent sedation and intubation.  Successfully extubated, now weaning supplemental oxygen given recovery.   Assessment & Plan:  Acute on chronic hypoxemic and hypercapnic respiratory failure Flu A Questionable acute concurrent COPD exacerbation -Continue to wean oxygen as tolerated, baseline patient uses 2 L nasal cannula at night only -Continue Tamiflu, IV steroids with taper as well as concurrent azithromycin and ceftriaxone x5 days to cover atypical bacterial pneumonia  -BiPAP as needed   Cardiomyopathy - EF 35%  HTN -Appears euvolemic, consider reinitiation of home carvedilol, Lasix, losartan once more appropriate, patient recently off pressors for 24 hours   Acute kidney injury, resolved -Creatinine at baseline, follow repeat labs   DVT prophylaxis: lovenox Code Status: DNR Family Communication: None present  Status is: Inpatient  Dispo: The patient is from: Home              Anticipated d/c is to: Home              Anticipated d/c date is: 48 to 72 hours pending clinical course              Patient currently not medically stable for discharge  Consultants:  PCCM  Procedures:  Intubated 07/29/2021, extubated 07/30/2021  Antimicrobials:  Tamiflu, ceftriaxone, azithromycin  Subjective: No acute issues or events overnight denies nausea vomiting diarrhea constipation headache fevers chills or chest pain  Objective: Vitals:   08/01/21 0600 08/01/21 0700 08/01/21 0718 08/01/21 0731  BP:  (!) 142/73    Pulse: 91 90 92    Resp: (!) 29 (!) 25 (!) 22   Temp:    98.9 F (37.2 C)  TempSrc:    Axillary  SpO2: 100% 100% 100%   Weight:      Height:        Intake/Output Summary (Last 24 hours) at 08/01/2021 0737 Last data filed at 08/01/2021 0300 Gross per 24 hour  Intake 1310 ml  Output 650 ml  Net 660 ml   Filed Weights   07/30/21 0500 07/31/21 0230 08/01/21 0500  Weight: 79.3 kg 74.3 kg 74.3 kg    Examination:  General:  Pleasantly resting in bed, No acute distress. HEENT:  Normocephalic atraumatic.  Sclerae nonicteric, noninjected.  Extraocular movements intact bilaterally. Neck:  Without mass or deformity.  Trachea is midline. Lungs: Coarse breath sounds bilaterally without overt wheeze or rales. Heart:  Regular rate and rhythm.  Without murmurs, rubs, or gallops. Abdomen:  Soft, nontender, nondistended.  Without guarding or rebound. Extremities: Without cyanosis, clubbing, edema, or obvious deformity. Vascular:  Dorsalis pedis and posterior tibial pulses palpable bilaterally. Skin:  Warm and dry, no erythema, no ulcerations.   Data Reviewed: I have personally reviewed following labs and imaging studies  CBC: Recent Labs  Lab 07/29/21 0820 07/29/21 1105 07/29/21 1728  WBC 11.7*  --   --   NEUTROABS 9.6*  --   --   HGB 11.4* 13.6 11.9*  HCT 36.0* 40.0 35.0*  MCV 94.5  --   --   PLT 237  --   --  Basic Metabolic Panel: Recent Labs  Lab 07/29/21 0742 07/29/21 1105 07/29/21 1728 07/30/21 0605 07/31/21 0153 08/01/21 0345  NA 133* 134* 134* 133* 136 136  K 5.0 4.7 5.6* 4.6 5.1 5.7*  CL 99  --   --  101 102 104  CO2 27  --   --  '22 24 24  ' GLUCOSE 97  --   --  164* 147* 73  BUN 39*  --   --  49* 44* 37*  CREATININE 1.20  --   --  1.59* 1.22 0.95  CALCIUM 9.1  --   --  8.5* 8.6* 8.5*  MG  --   --   --  2.1 2.4 2.2  PHOS  --   --   --  4.6 4.7* 3.0   GFR: Estimated Creatinine Clearance: 58.9 mL/min (by C-G formula based on SCr of 0.95 mg/dL). Liver Function Tests: No  results for input(s): AST, ALT, ALKPHOS, BILITOT, PROT, ALBUMIN in the last 168 hours. No results for input(s): LIPASE, AMYLASE in the last 168 hours. No results for input(s): AMMONIA in the last 168 hours. Coagulation Profile: No results for input(s): INR, PROTIME in the last 168 hours. Cardiac Enzymes: No results for input(s): CKTOTAL, CKMB, CKMBINDEX, TROPONINI in the last 168 hours. BNP (last 3 results) No results for input(s): PROBNP in the last 8760 hours. HbA1C: No results for input(s): HGBA1C in the last 72 hours. CBG: Recent Labs  Lab 07/31/21 1513 07/31/21 1925 07/31/21 2322 08/01/21 0329 08/01/21 0730  GLUCAP 133* 111* 73 82 61*   Lipid Profile: Recent Labs    07/30/21 0605  TRIG 150*   Thyroid Function Tests: No results for input(s): TSH, T4TOTAL, FREET4, T3FREE, THYROIDAB in the last 72 hours. Anemia Panel: No results for input(s): VITAMINB12, FOLATE, FERRITIN, TIBC, IRON, RETICCTPCT in the last 72 hours. Sepsis Labs: Recent Labs  Lab 07/29/21 0820  PROCALCITON <0.10    Recent Results (from the past 240 hour(s))  Resp Panel by RT-PCR (Flu A&B, Covid) Nasopharyngeal Swab     Status: Abnormal   Collection Time: 07/29/21  7:42 AM   Specimen: Nasopharyngeal Swab; Nasopharyngeal(NP) swabs in vial transport medium  Result Value Ref Range Status   SARS Coronavirus 2 by RT PCR NEGATIVE NEGATIVE Final    Comment: (NOTE) SARS-CoV-2 target nucleic acids are NOT DETECTED.  The SARS-CoV-2 RNA is generally detectable in upper respiratory specimens during the acute phase of infection. The lowest concentration of SARS-CoV-2 viral copies this assay can detect is 138 copies/mL. A negative result does not preclude SARS-Cov-2 infection and should not be used as the sole basis for treatment or other patient management decisions. A negative result may occur with  improper specimen collection/handling, submission of specimen other than nasopharyngeal swab, presence of  viral mutation(s) within the areas targeted by this assay, and inadequate number of viral copies(<138 copies/mL). A negative result must be combined with clinical observations, patient history, and epidemiological information. The expected result is Negative.  Fact Sheet for Patients:  EntrepreneurPulse.com.au  Fact Sheet for Healthcare Providers:  IncredibleEmployment.be  This test is no t yet approved or cleared by the [place] FDA and  has been authorized for detection and/or diagnosis of SARS-CoV-2 by FDA under an Emergency Use Authorization (EUA). This EUA will remain  in effect (meaning this test can be used) for the duration of the COVID-19 declaration under Section 564(b)(1) of the Act, 21 U.S.C.section 360bbb-3(b)(1), unless the authorization is terminated  or revoked sooner.  Influenza A by PCR POSITIVE (A) NEGATIVE Final   Influenza B by PCR NEGATIVE NEGATIVE Final    Comment: (NOTE) The Xpert Xpress SARS-CoV-2/FLU/RSV plus assay is intended as an aid in the diagnosis of influenza from Nasopharyngeal swab specimens and should not be used as a sole basis for treatment. Nasal washings and aspirates are unacceptable for Xpert Xpress SARS-CoV-2/FLU/RSV testing.  Fact Sheet for Patients: EntrepreneurPulse.com.au  Fact Sheet for Healthcare Providers: IncredibleEmployment.be  This test is not yet approved or cleared by the [place] FDA and has been authorized for detection and/or diagnosis of SARS-CoV-2 by FDA under an Emergency Use Authorization (EUA). This EUA will remain in effect (meaning this test can be used) for the duration of the COVID-19 declaration under Section 564(b)(1) of the Act, 21 U.S.C. section 360bbb-3(b)(1), unless the authorization is terminated or revoked.  Performed at [facility], [address] [place], [place] [address]   MRSA Next Gen by PCR,  Nasal     Status: None   Collection Time: 07/29/21  3:47 PM   Specimen: Nasal Mucosa; Nasal Swab  Result Value Ref Range Status   MRSA by PCR Next Gen NOT DETECTED NOT DETECTED Final    Comment: (NOTE) The GeneXpert MRSA Assay (FDA approved for NASAL specimens only), is one component of a comprehensive MRSA colonization surveillance program. It is not intended to diagnose MRSA infection nor to guide or monitor treatment for MRSA infections. Test performance is not FDA approved in patients less than 32 years old. Performed at [facility], [place] [address] [place], [place] [address]          Radiology Studies: ECHOCARDIOGRAM COMPLETE  Result Date: 07/30/2021    ECHOCARDIOGRAM REPORT   Patient Name:   TREVONTE ASHKAR Date of Exam: 07/30/2021 Medical Rec #:  [id number]       Height:       69.0 in Accession #:    [id number]      Weight:       174.8 lb Date of Birth:  1936-07-18        BSA:          1.951 m Patient Age:    71 years        BP:           141/57 mmHg Patient Gender: M               HR:           91 bpm. Exam Location:  Inpatient Procedure: 2D Echo, 3D Echo, Cardiac Doppler and Color Doppler Indications:    CHF-Acute Diastolic T01.77  History:        Patient has prior history of Echocardiogram examinations, most                 recent 03/02/2018. Cardiomyopathy, COPD, Arrythmias:LBBB and                 Bradycardia; Risk Factors:Diabetes, Hypertension and                 Dyslipidemia.  Sonographer:    Bernadene Person RDCS Referring Phys: [place]  1. Left ventricular ejection fraction, by estimation, is 25 to 30%. Left ventricular ejection fraction by 3D volume is 26 %. The left ventricle has severely decreased function. The left ventricle demonstrates regional wall motion abnormalities (see scoring diagram/findings for description). Left ventricular diastolic parameters are consistent with Grade III diastolic dysfunction (restrictive).  2. Right ventricular  systolic function is normal. The right ventricular size is normal. Tricuspid regurgitation signal is inadequate for assessing PA pressure.  3. The pericardial effusion is circumferential.  4. The mitral valve is normal in structure. Trivial mitral valve regurgitation. No evidence of mitral stenosis.  5. The aortic valve is normal in structure. Aortic valve regurgitation is not visualized. Aortic valve sclerosis is present, with no evidence of aortic valve stenosis.  6. The inferior vena cava is dilated in size with >50% respiratory variability, suggesting right atrial pressure of 8 mmHg. FINDINGS  Left Ventricle: Left ventricular ejection fraction, by estimation, is 25 to 30%. Left ventricular ejection fraction by 3D volume is 26 %. The left ventricle has severely decreased function. The left ventricle demonstrates regional wall motion abnormalities. The left ventricular internal cavity size was normal in size. There is no left ventricular hypertrophy. Left ventricular diastolic parameters are consistent with Grade III diastolic dysfunction (restrictive).  LV Wall Scoring: The entire anterior wall, antero-lateral wall, entire anterior septum, and apical lateral segment are akinetic. The entire inferior wall, posterior wall, mid inferoseptal segment, and basal inferoseptal segment are hypokinetic. Right Ventricle: The right ventricular size is normal. No increase in right ventricular wall thickness. Right ventricular systolic function is normal. Tricuspid regurgitation signal is inadequate for assessing PA pressure. Left Atrium: Left atrial size was normal in size. Right Atrium: Right atrial size was normal in size. Pericardium: Trivial pericardial effusion is present. The pericardial effusion is circumferential. Mitral Valve: The mitral valve is normal in structure. Trivial mitral valve regurgitation. No evidence of mitral valve stenosis. Tricuspid Valve: The tricuspid valve is normal in structure. Tricuspid valve  regurgitation is not demonstrated. No evidence of tricuspid stenosis. Aortic Valve: The aortic valve is normal in structure. Aortic valve regurgitation is not visualized. Aortic valve sclerosis is present, with no evidence of aortic valve stenosis. Pulmonic Valve: The pulmonic valve was normal in structure. Pulmonic valve regurgitation is not visualized. No evidence of pulmonic stenosis. Aorta: The aortic root is normal in size and structure. Venous: The inferior vena cava is dilated in size with greater than 50% respiratory variability, suggesting right atrial pressure of 8 mmHg. IAS/Shunts: No atrial level shunt detected by color flow Doppler.  LEFT VENTRICLE PLAX 2D LVIDd:         5.30 cm         Diastology LVIDs:         4.70 cm         LV e' medial:    4.82 cm/s LV PW:         1.20 cm         LV E/e' medial:  20.6 LV IVS:        0.80 cm         LV e' lateral:   6.66 cm/s LVOT diam:     2.00 cm         LV E/e' lateral: 14.9 LV SV:         74 LV SV Index:   38 LVOT Area:     3.14 cm        3D Volume EF                                LV 3D EF:    Left  ventricul LV Volumes (MOD)                            ar LV vol d, MOD    217.0 ml                   ejection A2C:                                        fraction LV vol d, MOD    180.0 ml                   by 3D A4C:                                        volume is LV vol s, MOD    158.0 ml                   26 %. A2C: LV vol s, MOD    135.0 ml A4C:                           3D Volume EF: LV SV MOD A2C:   59.0 ml       3D EF:        26 % LV SV MOD A4C:   180.0 ml      LV EDV:       200 ml LV SV MOD BP:    52.7 ml       LV ESV:       147 ml                                LV SV:        53 ml RIGHT VENTRICLE RV S prime:     14.00 cm/s TAPSE (M-mode): 2.3 cm LEFT ATRIUM             Index        RIGHT ATRIUM           Index LA diam:        3.50 cm 1.79 cm/m   RA Area:     18.90 cm LA Vol (A2C):   59.4 ml 30.44 ml/m  RA  Volume:   52.30 ml  26.80 ml/m LA Vol (A4C):   63.4 ml 32.49 ml/m LA Biplane Vol: 61.0 ml 31.26 ml/m  AORTIC VALVE LVOT Vmax:   120.00 cm/s LVOT Vmean:  80.800 cm/s LVOT VTI:    0.234 m  AORTA Ao Root diam: 3.30 cm Ao Asc diam:  3.00 cm MITRAL VALVE MV Area (PHT): 3.34 cm     SHUNTS MV Decel Time: 227 msec     Systemic VTI:  0.23 m MV E velocity: 99.20 cm/s   Systemic Diam: 2.00 cm MV A velocity: 132.00 cm/s MV E/A ratio:  0.75 Kardie Tobb DO Electronically signed by Berniece Salines DO Signature Date/Time: 07/30/2021/1:19:38 PM    Final         Scheduled Meds:  albuterol  2.5 mg Nebulization TID   arformoterol  15 mcg Nebulization BID   aspirin  325 mg Oral Daily   budesonide (PULMICORT)  nebulizer solution  0.25 mg Nebulization BID   chlorhexidine gluconate (MEDLINE KIT)  15 mL Mouth Rinse BID   Chlorhexidine Gluconate Cloth  6 each Topical Daily   docusate sodium  100 mg Oral BID   enoxaparin (LOVENOX) injection  40 mg Subcutaneous Q24H   guaiFENesin  600 mg Oral BID   insulin aspart  0-9 Units Subcutaneous Q4H   methylPREDNISolone (SOLU-MEDROL) injection  20 mg Intravenous Daily   oseltamivir  30 mg Oral BID   revefenacin  175 mcg Nebulization Daily   Continuous Infusions:  sodium chloride Stopped (07/29/21 1549)   cefTRIAXone (ROCEPHIN)  IV 1 g (07/31/21 1757)    LOS: 3 days   Time spent: 80mn  Shmiel Morton C Nazaria Riesen, DO [facility]  If 7PM-7AM, please contact night-coverage [email or web address]  08/01/2021, 7:37 AM

## 2021-08-01 NOTE — Progress Notes (Signed)
Hypoglycemic Event  CBG: 61, 57, 63, 69  Treatment: 4 oz juice/soda, 8 oz juice  Symptoms: None  Follow-up CBG: Time: 0754, 6606,0045, 0907  CBG Result: Final CBG: 77  Possible Reasons for Event: Unknown   MD Notified: Dr. Avon Gully made aware at [phone], RN instructed to wait 30 minutes and check CBG again.    Delilah Shan

## 2021-08-02 LAB — BASIC METABOLIC PANEL
Anion gap: 8 (ref 5–15)
BUN: 28 mg/dL — ABNORMAL HIGH (ref 8–23)
CO2: 30 mmol/L (ref 22–32)
Calcium: 8.6 mg/dL — ABNORMAL LOW (ref 8.9–10.3)
Chloride: 98 mmol/L (ref 98–111)
Creatinine, Ser: 0.84 mg/dL (ref 0.61–1.24)
GFR, Estimated: >60 mL/min (ref >60)
Glucose, Bld: 88 mg/dL (ref 70–99)
Potassium: 5 mmol/L (ref 3.5–5.1)
Sodium: 136 mmol/L (ref 135–145)

## 2021-08-02 LAB — GLUCOSE, CAPILLARY
Glucose-Capillary: 100 mg/dL — ABNORMAL HIGH (ref 70–99)
Glucose-Capillary: 115 mg/dL — ABNORMAL HIGH (ref 70–99)
Glucose-Capillary: 150 mg/dL — ABNORMAL HIGH (ref 70–99)
Glucose-Capillary: 82 mg/dL (ref 70–99)
Glucose-Capillary: 89 mg/dL (ref 70–99)
Glucose-Capillary: 93 mg/dL (ref 70–99)

## 2021-08-02 LAB — MAGNESIUM: Magnesium: 2 mg/dL (ref 1.7–2.4)

## 2021-08-02 LAB — PHOSPHORUS: Phosphorus: 2.3 mg/dL — ABNORMAL LOW (ref 2.5–4.6)

## 2021-08-02 NOTE — Progress Notes (Signed)
OT Cancellation Note  Patient Details Name: Harry Norris MRN: [id number] DOB: 1935-10-13   Cancelled Treatment:    Reason Eval/Treat Not Completed: Patient currently with another discipline.  Continue efforts as schedule allows.   Christelle Igoe D Camreigh Michie 08/02/2021, 3:54 PM

## 2021-08-02 NOTE — Progress Notes (Signed)
PROGRESS NOTE    KASCH BORQUEZ  GEZ:[id number] DOB: 1935-10-16 DOA: 07/29/2021 PCP: [name], MD   Brief Narrative:  Camila Li. Rosenow is a 85 y.o. M with hx COPD, cardiomyopathy with EF 35%, HTN who presented to [facility] 11/29 complaining of congestion, dyspnea, and wheezing. On initial ED presentation he had improvement of respiratory symptoms initially and was found to be positive for flu however did become less responsive requiring emergent sedation and intubation.  Successfully extubated, now weaning supplemental oxygen given recovery.   Assessment & Plan:  Acute on chronic hypoxemic and hypercapnic respiratory failure, improving Flu A Questionable acute concurrent COPD exacerbation -Continue to wean oxygen as tolerated, baseline patient uses 2 L nasal cannula at night only -Continue Tamiflu, IV steroids with taper as well as concurrent azithromycin and ceftriaxone x5 days to cover atypical bacterial pneumonia  -BiPAP as needed -Ambulatory oxygen screen pending   Cardiomyopathy - EF 35%  HTN -Appears euvolemic, consider reinitiation of home carvedilol, Lasix, losartan once more appropriate, patient recently off pressors for 24 hours   Acute kidney injury, resolved -Creatinine at baseline, follow repeat labs   DVT prophylaxis: lovenox Code Status: DNR Family Communication: None present  Status is: Inpatient  Dispo: The patient is from: Home              Anticipated d/c is to: Home              Anticipated d/c date is: 48 to 72 hours pending clinical course              Patient currently not medically stable for discharge  Consultants:  PCCM  Procedures:  Intubated 07/29/2021, extubated 07/30/2021  Antimicrobials:  Tamiflu, ceftriaxone, azithromycin  Subjective: No acute issues or events overnight denies nausea vomiting diarrhea constipation headache fevers chills or chest pain  Objective: Vitals:   08/02/21 0029 08/02/21 0334 08/02/21 0500 08/02/21  0758  BP: (!) 168/70 (!) 149/88  (!) 145/60  Pulse: 79 89  89  Resp: _0 Temp: 98.2 F (36.8 C) 97.9 F (36.6 C)  97.8 F (36.6 C)  TempSrc:    Oral  SpO2: 98% 91%  96%  Weight:   74.2 kg   Height:        Intake/Output Summary (Last 24 hours) at 08/02/2021 0808 Last data filed at 08/01/2021 1925 Gross per 24 hour  Intake 990 ml  Output 300 ml  Net 690 ml    Filed Weights   07/31/21 0230 08/01/21 0500 08/02/21 0500  Weight: 74.3 kg 74.3 kg 74.2 kg    Examination:  General:  Pleasantly resting in bed, No acute distress. HEENT:  Normocephalic atraumatic.  Sclerae nonicteric, noninjected.  Extraocular movements intact bilaterally. Neck:  Without mass or deformity.  Trachea is midline. Lungs: Coarse breath sounds bilaterally without overt wheeze or rales. Heart:  Regular rate and rhythm.  Without murmurs, rubs, or gallops. Abdomen:  Soft, nontender, nondistended.  Without guarding or rebound. Extremities: Without cyanosis, clubbing, edema, or obvious deformity. Vascular:  Dorsalis pedis and posterior tibial pulses palpable bilaterally. Skin:  Warm and dry, no erythema, no ulcerations.   Data Reviewed: I have personally reviewed following labs and imaging studies  CBC: Recent Labs  Lab 07/29/21 0820 07/29/21 1105 07/29/21 1728  WBC 11.7*  --   --   NEUTROABS 9.6*  --   --   HGB 11.4* 13.6 11.9*  HCT 36.0* 40.0 35.0*  MCV 94.5  --   --  PLT 237  --   --     Basic Metabolic Panel: Recent Labs  Lab 07/30/21 0605 07/31/21 0153 08/01/21 0345 08/01/21 1327 08/02/21 0032  NA 133* 136 136 135 136  K 4.6 5.1 5.7* 5.1 5.0  CL 101 102 104 100 98  CO2 _0 GLUCOSE 164* 147* 73 134* 88  BUN 49* 44* 37* 32* 28*  CREATININE 1.59* 1.22 0.95 1.01 0.84  CALCIUM 8.5* 8.6* 8.5* 8.4* 8.6*  MG 2.1 2.4 2.2  --  2.0  PHOS 4.6 4.7* 3.0  --  2.3*    GFR: Estimated Creatinine Clearance: 66.6 mL/min (by C-G formula based on SCr of 0.84 mg/dL). Liver  Function Tests: No results for input(s): AST, ALT, ALKPHOS, BILITOT, PROT, ALBUMIN in the last 168 hours. No results for input(s): LIPASE, AMYLASE in the last 168 hours. No results for input(s): AMMONIA in the last 168 hours. Coagulation Profile: No results for input(s): INR, PROTIME in the last 168 hours. Cardiac Enzymes: No results for input(s): CKTOTAL, CKMB, CKMBINDEX, TROPONINI in the last 168 hours. BNP (last 3 results) No results for input(s): PROBNP in the last 8760 hours. HbA1C: No results for input(s): HGBA1C in the last 72 hours. CBG: Recent Labs  Lab 08/01/21 1623 08/01/21 2100 08/01/21 2310 08/02/21 0430 08/02/21 0758  GLUCAP 111* 96 76 93 82    Lipid Profile: No results for input(s): CHOL, HDL, LDLCALC, TRIG, CHOLHDL, LDLDIRECT in the last 72 hours.  Thyroid Function Tests: No results for input(s): TSH, T4TOTAL, FREET4, T3FREE, THYROIDAB in the last 72 hours. Anemia Panel: No results for input(s): VITAMINB12, FOLATE, FERRITIN, TIBC, IRON, RETICCTPCT in the last 72 hours. Sepsis Labs: Recent Labs  Lab 07/29/21 0820  PROCALCITON <0.10     Recent Results (from the past 240 hour(s))  Resp Panel by RT-PCR (Flu A&B, Covid) Nasopharyngeal Swab     Status: Abnormal   Collection Time: 07/29/21  7:42 AM   Specimen: Nasopharyngeal Swab; Nasopharyngeal(NP) swabs in vial transport medium  Result Value Ref Range Status   SARS Coronavirus 2 by RT PCR NEGATIVE NEGATIVE Final    Comment: (NOTE) SARS-CoV-2 target nucleic acids are NOT DETECTED.  The SARS-CoV-2 RNA is generally detectable in upper respiratory specimens during the acute phase of infection. The lowest concentration of SARS-CoV-2 viral copies this assay can detect is 138 copies/mL. A negative result does not preclude SARS-Cov-2 infection and should not be used as the sole basis for treatment or other patient management decisions. A negative result may occur with  improper specimen collection/handling,  submission of specimen other than nasopharyngeal swab, presence of viral mutation(s) within the areas targeted by this assay, and inadequate number of viral copies(<138 copies/mL). A negative result must be combined with clinical observations, patient history, and epidemiological information. The expected result is Negative.  Fact Sheet for Patients:  EntrepreneurPulse.com.au  Fact Sheet for Healthcare Providers:  IncredibleEmployment.be  This test is no t yet approved or cleared by the [place] FDA and  has been authorized for detection and/or diagnosis of SARS-CoV-2 by FDA under an Emergency Use Authorization (EUA). This EUA will remain  in effect (meaning this test can be used) for the duration of the COVID-19 declaration under Section 564(b)(1) of the Act, 21 U.S.C.section 360bbb-3(b)(1), unless the authorization is terminated  or revoked sooner.       Influenza A by PCR POSITIVE (A) NEGATIVE Final   Influenza B by PCR NEGATIVE NEGATIVE Final    Comment: (  NOTE) The Xpert Xpress SARS-CoV-2/FLU/RSV plus assay is intended as an aid in the diagnosis of influenza from Nasopharyngeal swab specimens and should not be used as a sole basis for treatment. Nasal washings and aspirates are unacceptable for Xpert Xpress SARS-CoV-2/FLU/RSV testing.  Fact Sheet for Patients: https://www.fda.gov/media/152166/download  Fact Sheet for Healthcare Providers: https://www.fda.gov/media/152162/download  This test is not yet approved or cleared by the [place] FDA and has been authorized for detection and/or diagnosis of SARS-CoV-2 by FDA under an Emergency Use Authorization (EUA). This EUA will remain in effect (meaning this test can be used) for the duration of the COVID-19 declaration under Section 564(b)(1) of the Act, 21 U.S.C. section 360bbb-3(b)(1), unless the authorization is terminated or revoked.  Performed at [facility] [place],  [address] [place], [place] [address]   MRSA Next Gen by PCR, Nasal     Status: None   Collection Time: 07/29/21  3:47 PM   Specimen: Nasal Mucosa; Nasal Swab  Result Value Ref Range Status   MRSA by PCR Next Gen NOT DETECTED NOT DETECTED Final    Comment: (NOTE) The GeneXpert MRSA Assay (FDA approved for NASAL specimens only), is one component of a comprehensive MRSA colonization surveillance program. It is not intended to diagnose MRSA infection nor to guide or monitor treatment for MRSA infections. Test performance is not FDA approved in patients less than 2 years old. Performed at [facility], [address] , [place] [address]           Radiology Studies: No results found.      Scheduled Meds:  arformoterol  15 mcg Nebulization BID   aspirin  325 mg Oral Daily   budesonide (PULMICORT) nebulizer solution  0.25 mg Nebulization BID   chlorhexidine gluconate (MEDLINE KIT)  15 mL Mouth Rinse BID   Chlorhexidine Gluconate Cloth  6 each Topical Daily   docusate sodium  100 mg Oral BID   enoxaparin (LOVENOX) injection  40 mg Subcutaneous Q24H   guaiFENesin  600 mg Oral BID   insulin aspart  0-9 Units Subcutaneous Q4H   methylPREDNISolone (SOLU-MEDROL) injection  20 mg Intravenous Daily   oseltamivir  30 mg Oral BID   revefenacin  175 mcg Nebulization Daily   Continuous Infusions:  sodium chloride Stopped (07/29/21 1549)   cefTRIAXone (ROCEPHIN)  IV 1 g (08/01/21 1640)    LOS: 4 days   Time spent: 35min  William C Lancaster, DO [facility]  If 7PM-7AM, please contact night-coverage [email or web address]  08/02/2021, 8:08 AM      

## 2021-08-02 NOTE — Evaluation (Signed)
Physical Therapy Evaluation Patient Details Name: Harry Norris MRN: [id number] DOB: 21-May-1936 Today's Date: 08/02/2021  History of Present Illness  85 yo male admitted on 11/29 for wheezing, SOB and congestion and was found to have flu A and PNA along with AKI complicated by EF 66%.  Pt was briefly intubated then became more responsive.  Pt is referred to PT to see how his exacerbated COPD is allwoing him to move toward decision for home vs rehab.  PMHx:  DM, cardiomyopathy, anemia, PNA, COPD,  Clinical Impression  Pt was seen for progression of mobility after testing off O2 and noting desaturation, was returned to O2 and noted 100% sat with pulse 99.  He is hoping to go directly home from hospital, but has had a great deal of medical complication.  Talked with pt and family about making pt's home care more elaborate, with HHPT, CNA for care of his needs and safety with home management, and for family to be more availiable to him to allow for his pulmonary recovery to be more stable.  Pt is against the idea of going to SNF and family is committed to helping him to make this possible.  Follow for acute PT goals and progress as tolerated with a careful watch of vitals and need to use O2.  Pt is interested in using a longer O2 line and will get guidance from MD to make the decision about feasibility.      Recommendations for follow up therapy are one component of a multi-disciplinary discharge planning process, led by the attending physician.  Recommendations may be updated based on patient status, additional functional criteria and insurance authorization.  Follow Up Recommendations Home health PT    Assistance Recommended at Discharge Intermittent Supervision/Assistance  Functional Status Assessment Patient has had a recent decline in their functional status and demonstrates the ability to make significant improvements in function in a reasonable and predictable amount of time.  Equipment  Recommendations  Rolling walker (2 wheels);Other (comment)    Recommendations for Other Services       Precautions / Restrictions Precautions Precautions: Fall Restrictions Weight Bearing Restrictions: No      Mobility  Bed Mobility Overal bed mobility: Needs Assistance Bed Mobility: Supine to Sit;Sit to Supine     Supine to sit: Min guard Sit to supine: Min guard        Transfers Overall transfer level: Needs assistance Equipment used: Rolling walker (2 wheels);1 person hand held assist Transfers: Sit to/from Stand Sit to Stand: Min guard                Ambulation/Gait Ambulation/Gait assistance: Min guard Gait Distance (Feet): 60 Feet (30 x 2) Assistive device: Rolling walker (2 wheels);1 person hand held assist Gait Pattern/deviations: Step-through pattern;Decreased stride length;Wide base of support (wide turns) Gait velocity: reduced Gait velocity interpretation: <1.31 ft/sec, indicative of household ambulator Pre-gait activities: standing wgt shift and sidesteps    Stairs            Wheelchair Mobility    Modified Rankin (Stroke Patients Only)       Balance Overall balance assessment: Needs assistance Sitting-balance support: Feet supported Sitting balance-Leahy Scale: Good     Standing balance support: Bilateral upper extremity supported;During functional activity Standing balance-Leahy Scale: Poor                               Pertinent Vitals/Pain Pain Assessment: No/denies pain  Breathing: occasional labored breathing, short period of hyperventilation Negative Vocalization: none Facial Expression: smiling or inexpressive Body Language: relaxed Consolability: no need to console PAINAD Score: 1 Facial Expression: Relaxed, neutral Body Movements: Absence of movements Muscle Tension: Relaxed    Home Living Family/patient expects to be discharged to:: Private residence Living Arrangements: Alone Available Help at  Discharge: Family;Friend(s);Available PRN/intermittently Type of Home: House Home Access: Stairs to enter   Entrance Stairs-Number of Steps: 1   Home Layout: One level Home Equipment: Conservation officer, nature (2 wheels);Rollator (4 wheels);Cane - single point;Shower seat;Toilet riser Additional Comments: pt is on O2 PRN but has concentrator and uses short line typically    Prior Function Prior Level of Function : Independent/Modified Independent             Mobility Comments: did his own cooking cleaning and self care       Hand Dominance   Dominant Hand: Right    Extremity/Trunk Assessment   Upper Extremity Assessment Upper Extremity Assessment: Overall WFL for tasks assessed    Lower Extremity Assessment Lower Extremity Assessment: Generalized weakness    Cervical / Trunk Assessment Cervical / Trunk Assessment: Kyphotic (mild)  Communication   Communication: HOH  Cognition Arousal/Alertness: Awake/alert Behavior During Therapy: WFL for tasks assessed/performed Overall Cognitive Status: Within Functional Limits for tasks assessed                                 General Comments: requires repetition of comments as hearing is poor        General Comments General comments (skin integrity, edema, etc.): pt is mildly weak, tried to go without O2 and was down to 86% sat, but on O2 and walking was 100% with pulse 99    Exercises     Assessment/Plan    PT Assessment Patient needs continued PT services  PT Problem List Cardiopulmonary status limiting activity;Decreased strength       PT Treatment Interventions DME instruction;Gait training;Stair training;Functional mobility training;Therapeutic activities;Therapeutic exercise;Balance training;Neuromuscular re-education;Patient/family education    PT Goals (Current goals can be found in the Care Plan section)  Acute Rehab PT Goals Patient Stated Goal: to go home and have help there PT Goal Formulation:  With patient/family Time For Goal Achievement: 08/16/21 Potential to Achieve Goals: Good    Frequency Min 3X/week   Barriers to discharge Decreased caregiver support home with sporadic help    Co-evaluation               AM-PAC PT "6 Clicks" Mobility  Outcome Measure Help needed turning from your back to your side while in a flat bed without using bedrails?: A Little Help needed moving from lying on your back to sitting on the side of a flat bed without using bedrails?: A Little Help needed moving to and from a bed to a chair (including a wheelchair)?: A Little Help needed standing up from a chair using your arms (e.g., wheelchair or bedside chair)?: A Little Help needed to walk in hospital room?: A Little Help needed climbing 3-5 steps with a railing? : A Lot 6 Click Score: 17    End of Session Equipment Utilized During Treatment: Gait belt;Oxygen Activity Tolerance: Patient tolerated treatment well;Treatment limited secondary to medical complications (Comment) Patient left: in bed;with call bell/phone within reach;with bed alarm set;with family/visitor present Nurse Communication: Mobility status PT Visit Diagnosis: Unsteadiness on feet (R26.81);Difficulty in walking, not elsewhere classified (R26.2)  Time: 9012-2241 PT Time Calculation (min) (ACUTE ONLY): 34 min   Charges:   PT Evaluation $PT Eval Moderate Complexity: 1 Mod PT Treatments $Gait Training: 8-22 mins       Ramond Dial 08/02/2021, 9:10 PM

## 2021-08-03 LAB — CBC
HCT: 38.3 % — ABNORMAL LOW (ref 39.0–52.0)
Hemoglobin: 12.6 g/dL — ABNORMAL LOW (ref 13.0–17.0)
MCH: 30.1 pg (ref 26.0–34.0)
MCHC: 32.9 g/dL (ref 30.0–36.0)
MCV: 91.6 fL (ref 80.0–100.0)
Platelets: 202 10*3/uL (ref 150–400)
RBC: 4.18 MIL/uL — ABNORMAL LOW (ref 4.22–5.81)
RDW: 16 % — ABNORMAL HIGH (ref 11.5–15.5)
WBC: 6.5 10*3/uL (ref 4.0–10.5)
nRBC: 0 % (ref 0.0–0.2)

## 2021-08-03 LAB — GLUCOSE, CAPILLARY
Glucose-Capillary: 85 mg/dL (ref 70–99)
Glucose-Capillary: 105 mg/dL — ABNORMAL HIGH (ref 70–99)
Glucose-Capillary: 112 mg/dL — ABNORMAL HIGH (ref 70–99)
Glucose-Capillary: 121 mg/dL — ABNORMAL HIGH (ref 70–99)
Glucose-Capillary: 93 mg/dL (ref 70–99)

## 2021-08-03 LAB — BASIC METABOLIC PANEL
Anion gap: 8 (ref 5–15)
BUN: 28 mg/dL — ABNORMAL HIGH (ref 8–23)
CO2: 27 mmol/L (ref 22–32)
Calcium: 8.6 mg/dL — ABNORMAL LOW (ref 8.9–10.3)
Chloride: 100 mmol/L (ref 98–111)
Creatinine, Ser: 0.9 mg/dL (ref 0.61–1.24)
GFR, Estimated: >60 mL/min (ref >60)
Glucose, Bld: 88 mg/dL (ref 70–99)
Potassium: 4.2 mmol/L (ref 3.5–5.1)
Sodium: 135 mmol/L (ref 135–145)

## 2021-08-03 MED ORDER — ARFORMOTEROL TARTRATE 15 MCG/2ML IN NEBU: 15.0000 ug | INHALATION_SOLUTION | Freq: Two times a day (BID) | RESPIRATORY_TRACT | 1 refills | Status: DC

## 2021-08-03 MED ORDER — REVEFENACIN 175 MCG/3ML IN SOLN: 175.0000 ug | Freq: Every day | RESPIRATORY_TRACT | 1 refills | Status: DC

## 2021-08-03 NOTE — Evaluation (Signed)
Occupational Therapy Evaluation Patient Details Name: Harry Norris MRN: [id number] DOB: 1935/09/17 Today's Date: 08/03/2021   History of Present Illness 85 yo male admitted on 11/29 for wheezing, SOB and congestion and was found to have flu A and PNA along with AKI complicated by EF 01%.  Pt was briefly intubated then became more responsive.  Pt is referred to PT to see how his exacerbated COPD is allwoing him to move toward decision for home vs rehab.  PMHx:  DM, cardiomyopathy, anemia, PNA, COPD,   Clinical Impression   At baseline pt independent with ADLs and functional mobility. Lives independently, but has family nearby, and is willing to hire help at d/c if needed. Pt supervision - min A for ADLs, requiring increased time due to fatigue. Pt  supervision for bed mobility, min guard for step pivot transfer. Pt remained on 2LO2 via [place] during session, SpO2 dropped into mid-80's upon sitting EOB, increased into 90's after ~1 minute. Pt limited by decreased balance, activity tolerance, strength, and ROM at this time. Will continue to follow acutely to maximize safety with ADLs and functional mobility. Recommend d/c home with [place].     Recommendations for follow up therapy are one component of a multi-disciplinary discharge planning process, led by the attending physician.  Recommendations may be updated based on patient status, additional functional criteria and insurance authorization.   Follow Up Recommendations  Home health OT    Assistance Recommended at Discharge Intermittent Supervision/Assistance  Functional Status Assessment  Patient has had a recent decline in their functional status and demonstrates the ability to make significant improvements in function in a reasonable and predictable amount of time.  Equipment Recommendations  None recommended by OT;Other (comment) (pt has all DME)    Recommendations for Other Services PT consult     Precautions / Restrictions  Precautions Precautions: Fall      Mobility Bed Mobility Overal bed mobility: Needs Assistance Bed Mobility: Supine to Sit     Supine to sit: Supervision          Transfers Overall transfer level: Needs assistance Equipment used: Rolling walker (2 wheels) Transfers: Sit to/from Stand;Bed to chair/wheelchair/BSC Sit to Stand: Min guard     Step pivot transfers: Min guard            Balance Overall balance assessment: Needs assistance Sitting-balance support: Feet supported Sitting balance-Leahy Scale: Normal     Standing balance support: Bilateral upper extremity supported;During functional activity Standing balance-Leahy Scale: Poor                             ADL either performed or assessed with clinical judgement   ADL Overall ADL's : Needs assistance/impaired Eating/Feeding: Set up;Sitting   Grooming: Oral care;Set up;Sitting   Upper Body Bathing: Sitting;Supervision/ safety Upper Body Bathing Details (indicate cue type and reason): RN reports pt bathed himself this AM Lower Body Bathing: Supervison/ safety;Sitting/lateral leans Lower Body Bathing Details (indicate cue type and reason): RN reports pt bathed self this morning Upper Body Dressing : Minimal assistance;Sitting   Lower Body Dressing: Minimal assistance;Sit to/from stand Lower Body Dressing Details (indicate cue type and reason): able to reach down and pull up socks prior to ambulation Toilet Transfer: Min guard;Ambulation;Rolling walker (2 wheels);BSC/3in1 Toilet Transfer Details (indicate cue type and reason): simulated to chair Toileting- Clothing Manipulation and Hygiene: Min guard;Sit to/from stand       Functional mobility during ADLs: Min guard  Vision   Vision Assessment?: No apparent visual deficits     Perception     Praxis      Pertinent Vitals/Pain Pain Assessment: No/denies pain     Hand Dominance Right   Extremity/Trunk Assessment Upper  Extremity Assessment Upper Extremity Assessment: Overall WFL for tasks assessed   Lower Extremity Assessment Lower Extremity Assessment: Defer to PT evaluation   Cervical / Trunk Assessment Cervical / Trunk Assessment: Kyphotic   Communication Communication Communication: HOH   Cognition Arousal/Alertness: Awake/alert Behavior During Therapy: WFL for tasks assessed/performed Overall Cognitive Status: Within Functional Limits for tasks assessed                                       General Comments  Pt's O2 dropped into high 80's with sitting EOB, recoverd quickly into 90's    Exercises     Shoulder Instructions      Home Living Family/patient expects to be discharged to:: Private residence Living Arrangements: Alone Available Help at Discharge: Family;Friend(s);Available PRN/intermittently Type of Home: House Home Access: Stairs to enter [employer] of Steps: 1   Home Layout: One level         Bathroom Toilet: Standard         Additional Comments: reports only using O2 at night, 2L, pt notes he will hire help if needed in order to d/c home      Prior Functioning/Environment Prior Level of Function : Independent/Modified Independent;Driving             Mobility Comments: did his own cooking cleaning and self care ADLs Comments: did his own cooking cleaning and self care        OT Problem List: Decreased strength;Decreased range of motion;Decreased activity tolerance;Impaired balance (sitting and/or standing);Cardiopulmonary status limiting activity      OT Treatment/Interventions: Self-care/ADL training;Energy conservation;Modalities;Therapeutic activities;Patient/family education    OT Goals(Current goals can be found in the care plan section) Acute Rehab OT Goals Patient Stated Goal: to go home OT Goal Formulation: With patient Time For Goal Achievement: 08/17/21 Potential to Achieve Goals: Good ADL Goals Pt Will  Perform Lower Body Bathing: with modified independence;sit to/from stand Pt Will Perform Lower Body Dressing: with modified independence;sit to/from stand Pt Will Transfer to Toilet: with modified independence;ambulating;regular height toilet Pt Will Perform Tub/Shower Transfer: ambulating;rolling walker;with modified independence  OT Frequency: Min 2X/week   Barriers to D/C: Decreased caregiver support          Co-evaluation              AM-PAC OT "6 Clicks" Daily Activity     Outcome Measure Help from another person eating meals?: None Help from another person taking care of personal grooming?: None Help from another person toileting, which includes using toliet, bedpan, or urinal?: A Little Help from another person bathing (including washing, rinsing, drying)?: A Little Help from another person to put on and taking off regular upper body clothing?: A Little Help from another person to put on and taking off regular lower body clothing?: A Little 6 Click Score: 20   End of Session Equipment Utilized During Treatment: Gait belt;Rolling walker (2 wheels);Oxygen Nurse Communication: Mobility status;Other (comment) (morning ADLs)  Activity Tolerance: Patient tolerated treatment well Patient left: in chair;with call bell/phone within reach;with chair alarm set  OT Visit Diagnosis: Unsteadiness on feet (R26.81);Other abnormalities of gait and mobility (R26.89);Muscle weakness (generalized) (M62.81)  Time: 0100-7121 OT Time Calculation (min): 26 min Charges:  OT General Charges $OT Visit: 1 Visit OT Evaluation $OT Eval Low Complexity: 1 Low OT Treatments $Self Care/Home Management : 8-22 mins  Lynnda Child, OTD, OTR/L Acute Rehab [phone]) [phone] [place] 08/03/2021, 10:26 AM

## 2021-08-03 NOTE — TOC Initial Note (Addendum)
Transition of Care [facility]) - Initial/Assessment Note    Patient Details  Name: Harry Norris MRN: [id number] Date of Birth: 1936-05-29  Transition [facility] CM/SW Contact:    Bartholomew Crews, RN Phone Number: [phone] 08/03/2021, 2:50 PM  Clinical Narrative:                  Spoke with patient and his son, Jori Moll, at the bedside. Demographics confirmed.   Patient has a nebulizer through [facility] and would like to use [facility] for home oxygen. [facility] ([facility]) in [place] - referral information faxed. Portable oxygen to be delivered to the room today prior to discharge.   Patient and son agreeable to home health PT and OT. Choice offered. Pending agency acceptance of referral.   Patient lives alone, but family to provide supervision during patient's recovery. Family to provide transportation home.   TOC following for transition needs.   Update 1523p: [facility]; [facility]; and  [facility] unable to accept referral d/t staffing. [facility] unable to accept d/t not covering [employer]. Referral faxed to [facility] pending.    Update 1700p: [facility], [facility], and [facility] unable to accept referral d/t staffing. Message left for [facility] pending call back. Will follow up with [facility]. Patient and son aware of difficulty finding [place] agency to accept.   Expected Discharge Plan: [place] Barriers to Discharge: No Barriers Identified   Patient Goals and CMS Choice Patient states their goals for this hospitalization and ongoing recovery are:: return home with family support CMS Medicare.gov Compare Post Acute Care list provided to:: Patient Choice offered to / list presented to : Patient, Adult Children  Expected Discharge Plan and Services Expected Discharge Plan: [place] In-house Referral: NA Discharge Planning Services: CM Consult Post Acute Care Choice: Home Health, Durable Medical Equipment Living  arrangements for the past 2 months: Single Family Home Expected Discharge Date: 08/03/21               DME Arranged: Oxygen DME Agency: [facility] Date DME Agency Contacted: 08/03/21 Time DME Agency Contacted: [phone] Representative spoke with at DME Agency: [place]: PT, OT          Prior Living Arrangements/Services Living arrangements for the past 2 months: [place] with:: Self Patient language and need for interpreter reviewed:: Yes Do you feel safe going back to the place where you live?: Yes      Need for Family Participation in Patient Care: Yes (Comment) Care giver support system in place?: Yes (comment) Current home services: DME (nebulizer) Criminal Activity/Legal Involvement Pertinent to Current Situation/Hospitalization: No - Comment as needed  Activities of Daily Living Home Assistive Devices/Equipment: Walker (specify type) (Rolators x 4,) ADL Screening (condition at time of admission) Patient's cognitive ability adequate to safely complete daily activities?: Yes Is the patient deaf or have difficulty hearing?: Yes ([place]) Does the patient have difficulty seeing, even when wearing glasses/contacts?: No Does the patient have difficulty concentrating, remembering, or making decisions?: Yes Patient able to express need for assistance with ADLs?: Yes Does the patient have difficulty dressing or bathing?: Yes Independently performs ADLs?: No Communication: Independent Dressing (OT): Needs assistance Is this a change from baseline?: Change from baseline, expected to last >3 days Grooming: Needs assistance Is this a change from baseline?: Change from baseline, expected to last >3 days Feeding: Independent Bathing: Needs assistance Is this a change from baseline?: Change from baseline, expected  to last >3 days Toileting: Needs assistance Is this a change from baseline?: Change from baseline, expected to last >3days In/Out Bed: Needs assistance Is  this a change from baseline?: Change from baseline, expected to last >3 days Walks in Home: Needs assistance Is this a change from baseline?: Change from baseline, expected to last >3 days Does the patient have difficulty walking or climbing stairs?: Yes Weakness of Legs: Both Weakness of Arms/Hands: Both  Permission Sought/Granted Permission sought to share information with : Family Supports    Share Information with NAME: Phillp Dolores     Permission granted to share info w Relationship: son     Emotional Assessment Appearance:: Appears stated age Attitude/Demeanor/Rapport: Engaged Affect (typically observed): Accepting Orientation: : Oriented to Self, Oriented to Place, Oriented to  Time, Oriented to Situation Alcohol / Substance Use: Not Applicable Psych Involvement: No (comment)  Admission diagnosis:  Respiratory failure ([place]) [J96.90] Influenza A [J10.1] COPD exacerbation ([place]) [J44.1] Acute respiratory failure with hypoxia ([place]) [J96.01] History of ETT [Z92.89] HAP (hospital-acquired pneumonia) [J18.9, Y95] Pneumothorax [J93.9] Influenza [J11.1] Patient Active Problem List   Diagnosis Date Noted   Pressure injury of skin 07/30/2021   Respiratory failure ([place]) 07/29/2021   Influenza 07/29/2021   Bradycardia 12/19/2018   Orthostatic hypotension 12/19/2018   Fatigue 12/12/2018   Hyperlipidemia 02/07/2018   Asthma 02/07/2018   Chronic rhinitis 02/26/2016   Cardiomyopathy ([place]) 12/18/2015   Chronic venous insufficiency 12/18/2015   LBBB (left bundle branch block) 12/18/2015   Syncope 12/18/2015   Hypoxemia 04/10/2014   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 [place] [address] Phone: [phone] Fax: [facility], [address] [place] [address] Phone: [phone] Fax: [phone]  [facility] ([facility]) - [place], [address] [place] [address] Phone: [phone] Fax: [phone]     Social Determinants of Health (SDOH) Interventions    Readmission Risk Interventions No flowsheet data found.

## 2021-08-03 NOTE — Plan of Care (Signed)

## 2021-08-03 NOTE — Discharge Summary (Signed)
Physician Discharge Summary  Harry Norris XNT:[id number] DOB: [birth year] DOA: 07/29/2021  PCP: [name], MD  Admit date: 07/29/2021 Discharge date: 08/03/2021  Admitted From: Home Disposition: Home  Recommendations for Outpatient Follow-up:  Follow up with PCP in 1-2 weeks Please obtain BMP/CBC in one week Please follow up with pulmonology as scheduled  Home Health: PT OT Equipment/Devices: Oxygen 2 L with exertion  Discharge Condition: Stable CODE STATUS: DNR Diet recommendation: Low-salt low-fat diet  Brief/Interim Summary: Harry Norris is a 85 y.o. M with hx COPD, cardiomyopathy with EF 35%, HTN who presented to [facility] 11/29 complaining of congestion, dyspnea, and wheezing. On initial ED presentation he had improvement of respiratory symptoms initially and was found to be positive for flu however did become less responsive requiring emergent sedation and intubation.  Successfully extubated, now weaning supplemental oxygen given recovery.     Assessment & Plan:   Acute on chronic hypoxemic and hypercapnic respiratory failure, improving Flu A, resolving Questionable acute concurrent COPD exacerbation -Patient completed antibiotic and antiviral treatments -Hypoxia improving drastically, requires 2 L nasal cannula with exertion only, baseline is 2 L at night only, continue to wean in the outpatient setting per PCP and pulmonology  Cardiomyopathy - EF 35%  HTN -Appears euvolemic, consider reinitiation of home carvedilol, Lasix, losartan once more appropriate, patient recently off pressors for 24 hours   Acute kidney injury, resolved -Creatinine at baseline, follow repeat labs     Discharge Diagnoses:  Principal Problem:   Respiratory failure ([place]) Active Problems:   Influenza   Pressure injury of skin    Discharge Instructions  Discharge Instructions     Discharge patient   Complete by: As directed    Discharge disposition: 01-Home or Self Care    Discharge patient date: 08/03/2021   For home use only DME oxygen   Complete by: As directed    Length of Need: 6 Months   Mode or (Route): Nasal cannula   Liters per Minute: 2   Frequency: Continuous (stationary and portable oxygen unit needed)   Oxygen delivery system: Gas      Allergies as of 08/03/2021       Reactions   Ace Inhibitors Cough        Medication List     STOP taking these medications    Bevespi Aerosphere 9-4.8 MCG/ACT Aero Generic drug: Glycopyrrolate-Formoterol   IPRATROPIUM BROMIDE HFA IN   levofloxacin 750 MG tablet Commonly known as: LEVAQUIN   losartan 100 MG tablet Commonly known as: COZAAR   Perforomist 20 MCG/2ML nebulizer solution Generic drug: formoterol   predniSONE 10 MG tablet Commonly known as: DELTASONE       TAKE these medications    albuterol 108 (90 Base) MCG/ACT inhaler Commonly known as: VENTOLIN HFA Inhale 2 puffs into the lungs every 6 (six) hours as needed for wheezing or shortness of breath.   arformoterol 15 MCG/2ML Nebu Commonly known as: BROVANA Take 2 mLs (15 mcg total) by nebulization 2 (two) times daily.   aspirin EC 81 MG tablet Take 81 mg by mouth daily. Swallow whole.   atorvastatin 40 MG tablet Commonly known as: LIPITOR Take 40 mg by mouth at bedtime.   budesonide 180 MCG/ACT inhaler Commonly known as: PULMICORT Inhale 2 puffs into the lungs 2 (two) times daily.   carvedilol 12.5 MG tablet Commonly known as: COREG Take 12.5 mg by mouth in the morning and at bedtime.   Centrum Silver 50+Men Tabs Take 1 tablet  by mouth daily with breakfast.   fenofibrate 160 MG tablet Take 160 mg by mouth daily.   Fish Oil 1000 MG Caps Take 1,000 mg by mouth 2 (two) times daily.   furosemide 40 MG tablet Commonly known as: LASIX Take 40 mg by mouth in the morning.   naproxen 500 MG tablet Commonly known as: NAPROSYN Take 500 mg by mouth daily as needed (for pain).   OXYGEN Inhale 2 L/min into the  lungs as needed (for shortness of breath).   oxymetazoline 0.05 % nasal spray Commonly known as: AFRIN Place 1 spray into both nostrils 2 (two) times daily as needed for congestion.   revefenacin 175 MCG/3ML nebulizer solution Commonly known as: YUPELRI Take 3 mLs (175 mcg total) by nebulization daily. Start taking on: August 04, 2021   vitamin C 500 MG tablet Commonly known as: ASCORBIC ACID Take 500 mg by mouth daily.   vitamin E 180 MG (400 UNITS) capsule Take 400 Units by mouth daily.               Durable Medical Equipment  (From admission, onward)           Start     Ordered   08/03/21 0000  For home use only DME oxygen       Question Answer Comment  Length of Need 6 Months   Mode or (Route) Nasal cannula   Liters per Minute 2   Frequency Continuous (stationary and portable oxygen unit needed)   Oxygen delivery system Gas      08/03/21 1327            Allergies  Allergen Reactions   Ace Inhibitors Cough    Consultations: PCCM  Procedures/Studies: CT Angio Chest PE W and/or Wo Contrast  Result Date: 07/29/2021 CLINICAL DATA:  85 year old male with history of shortness of breath, cough, and influenza A. EXAM: CT ANGIOGRAPHY CHEST WITH CONTRAST TECHNIQUE: Multidetector CT imaging of the chest was performed using the standard protocol during bolus administration of intravenous contrast. Multiplanar CT image reconstructions and MIPs were obtained to evaluate the vascular anatomy. CONTRAST:  100 mL Omnipaque 350, intravenous COMPARISON:  07/29/2021, 06/06/2021, 07/12/2021 FINDINGS: Cardiovascular: Satisfactory opacification of the pulmonary arteries to the segmental level. No evidence of pulmonary embolism. Similar appearing mild global cardiomegaly. No pericardial effusion. Atherosclerotic calcifications of the thoracic aorta which is normal in caliber and appears patent. Mild coronary atherosclerotic calcifications. Mediastinum/Nodes: No enlarged  mediastinal, hilar, or axillary lymph nodes. Thyroid gland, trachea, and esophagus demonstrate no significant findings. Lungs/Pleura: Fine parenchymal details limited by respiratory motion artifact. Scattered right basilar and focal left basilar peripheral consolidative opacities, slightly less conspicuous than comparison. Again seen is right basilar pleural thickening versus trac pleural effusion. Significantly decreased conspicuity of previously visualized focal ground-glass opacity in the superior segment of the left lower lobe. No pneumothorax. Upper Abdomen: The visualized upper abdomen is within normal limits. Musculoskeletal: Multilevel mild degenerative changes of the thoracic spine. Similar appearing right sternoclavicular joint degenerative change. No acute osseous abnormality. Review of the MIP images confirms the above findings. IMPRESSION: Vascular: 1. No evidence of pulmonary embolism. 2. Similar appearing coronary and aortic atherosclerosis (ICD10-I70.0). 3. Similar appearing mild global cardiomegaly. Non-Vascular: Global improvement of consolidative and ground-glass opacities in the bilateral lung bases and superior segment of the left lower lobe, with the remaining opacities most prominent in the dependent right lower lobe. There is persistent posterior right lower lobe reactive pleural thickening versus trace pleural effusion Dylan  Serafina Royals, MD [facility] Electronically Signed   By: Ruthann Cancer M.D.   On: 07/29/2021 09:20   DG Chest Port 1 View  Result Date: 07/30/2021 CLINICAL DATA:  Endotracheal tube.  Respiratory insufficiency. EXAM: PORTABLE CHEST 1 VIEW COMPARISON:  Yesterday FINDINGS: Endotracheal tube terminates 5.0 cm above carina. Nasogastric tube terminates at the body of the stomach with the side port just below the gastroesophageal junction. Midline trachea. Normal heart size. Atherosclerosis in the transverse aorta. No  pleural effusion or pneumothorax. Mild diffuse interstitial thickening is likely secondary to the history of smoking and resultant chronic bronchitis. Mild bibasilar airspace disease is felt to be similar. IMPRESSION: COPD/chronic bronchitis with mild bibasilar atelectasis. Aortic Atherosclerosis (ICD10-I70.0). Electronically Signed   By: Abigail Miyamoto M.D.   On: 07/30/2021 08:04   DG Chest Port 1 View  Result Date: 07/29/2021 CLINICAL DATA:  Evaluate for pneumothorax. EXAM: PORTABLE CHEST 1 VIEW COMPARISON:  July 29, 2021 (4:32 p.m.) FINDINGS: There is stable endotracheal tube and nasogastric tube positioning. Mild, predominant stable bibasilar atelectasis is seen. There is no evidence of a pleural effusion or pneumothorax. The heart size and mediastinal contours are within normal limits. Moderate severity calcification of the aortic arch is seen. Degenerative changes are noted throughout the thoracic spine. IMPRESSION: 1. Stable endotracheal tube and nasogastric tube positioning. 2. Mild, predominant stable bibasilar atelectasis. Electronically Signed   By: Virgina Norfolk M.D.   On: 07/29/2021 21:02   DG CHEST PORT 1 VIEW  Result Date: 07/29/2021 CLINICAL DATA:  Respiratory failure. EXAM: PORTABLE CHEST 1 VIEW COMPARISON:  Chest x-ray 07/29/2021. FINDINGS: Endotracheal tube tip is 7 cm above the carina at the level of the clavicular heads, unchanged. Enteric tube extends below the diaphragm Previously identified left pneumothorax is no longer seen. Cardiomediastinal silhouette is stable, the heart is mildly enlarged. There are minimal patchy left infrahilar opacities similar to prior. The right lung base is now clear. There is a stable small right pleural effusion. The osseous structures are unchanged. IMPRESSION: 1. Left pneumothorax is no longer visualized. 2. Endotracheal tube tip 7 cm above carina. 3. Improved aeration in the right lung base. Stable left infrahilar atelectasis/airspace disease.  Electronically Signed   By: Ronney Asters M.D.   On: 07/29/2021 16:43   DG Chest Port 1 View  Addendum Date: 07/29/2021   ADDENDUM REPORT: 07/29/2021 11:20 ADDENDUM: The original report was by Dr. Van Clines. The following addendum is by Dr. Van Clines: Critical Value/emergent results were called by telephone at the time of interpretation on 07/29/2021 at 11:20 am to provider Dr. Lennice Sites, Who verbally acknowledged these results. Electronically Signed   By: Van Clines M.D.   On: 07/29/2021 11:20   Result Date: 07/29/2021 CLINICAL DATA:  Endotracheal tube placement EXAM: PORTABLE CHEST 1 VIEW COMPARISON:  Radiographs and CT of 07/29/2021 including radiographs from 6:49 a.m. FINDINGS: The endotracheal tube tip is 7.3 cm above the carina. Atherosclerotic calcification of the aortic arch. The lung apices are partially excluded, but there some peripheral lucency at the left lung apex raising suspicion for a potential 2% left apical pneumothorax. There is no pneumothorax bursa along the CT from 8:58 a.m. Mild cardiomegaly. Stable bandlike airspace opacity at the right lung base. Thoracic spondylosis. Mildly increased interstitial accentuation in the lungs potentially from early interstitial edema or atypical pneumonia. IMPRESSION: 1. Endotracheal tube tip is 7.3 cm above the carina. 2. Possible 2% left apical pneumothorax. This is not of a  volume to be clinically threatening, but was not present on exams from earlier today. Surveillance chest radiography is likely warranted. 3. Mild cardiomegaly with suspected early interstitial edema or atypical pneumonia. Radiology assistant personnel have been notified to put me in telephone contact with the referring physician or the referring physician's clinical representative in order to discuss these findings. Once this communication is established I will issue an addendum to this report for documentation purposes. Electronically Signed: By:  Van Clines M.D. On: 07/29/2021 11:11   DG Chest Portable 1 View  Result Date: 07/29/2021 CLINICAL DATA:  Shortness of breath and cough. EXAM: PORTABLE CHEST 1 VIEW COMPARISON:  07/12/2021 FINDINGS: 0652 hours. The cardio pericardial silhouette is enlarged. Lungs are hyperexpanded. Interstitial markings are diffusely coarsened with chronic features. Minimal basilar atelectasis or infiltrate noted bilaterally. No substantial pleural effusion. Bones are diffusely demineralized. Telemetry leads overlie the chest. IMPRESSION: Chronic interstitial changes with bibasilar atelectasis or infiltrate. Electronically Signed   By: Misty Stanley M.D.   On: 07/29/2021 07:35   DG Abd Portable 1V  Result Date: 07/29/2021 CLINICAL DATA:  OG tube placement EXAM: PORTABLE ABDOMEN - 1 VIEW COMPARISON:  08/21/2020, CT 06/06/2021 FINDINGS: Esophageal tube tip and side port overlie the proximal stomach. Nonobstructed upper gas pattern. IMPRESSION: Esophageal tube tip and side port overlie the proximal stomach Electronically Signed   By: Donavan Foil M.D.   On: 07/29/2021 19:10   ECHOCARDIOGRAM COMPLETE  Result Date: 07/30/2021    ECHOCARDIOGRAM REPORT   Patient Name:   Harry Norris Date of Exam: 07/30/2021 Medical Rec #:  [id number]       Height:       69.0 in Accession #:    [id number]      Weight:       174.8 lb Date of Birth:  03/14/1936        BSA:          1.951 m Patient Age:    57 years        BP:           141/57 mmHg Patient Gender: M               HR:           91 bpm. Exam Location:  Inpatient Procedure: 2D Echo, 3D Echo, Cardiac Doppler and Color Doppler Indications:    CHF-Acute Diastolic I34.74  History:        Patient has prior history of Echocardiogram examinations, most                 recent 03/02/2018. Cardiomyopathy, COPD, Arrythmias:LBBB and                 Bradycardia; Risk Factors:Diabetes, Hypertension and                 Dyslipidemia.  Sonographer:    Bernadene Person RDCS Referring Phys: [place]  1. Left ventricular ejection fraction, by estimation, is 25 to 30%. Left ventricular ejection fraction by 3D volume is 26 %. The left ventricle has severely decreased function. The left ventricle demonstrates regional wall motion abnormalities (see scoring diagram/findings for description). Left ventricular diastolic parameters are consistent with Grade III diastolic dysfunction (restrictive).  2. Right ventricular systolic function is normal. The right ventricular size is normal. Tricuspid regurgitation signal is inadequate for assessing PA pressure.  3. The pericardial effusion is circumferential.  4. The mitral valve is normal in structure. Trivial mitral valve  regurgitation. No evidence of mitral stenosis.  5. The aortic valve is normal in structure. Aortic valve regurgitation is not visualized. Aortic valve sclerosis is present, with no evidence of aortic valve stenosis.  6. The inferior vena cava is dilated in size with >50% respiratory variability, suggesting right atrial pressure of 8 mmHg. FINDINGS  Left Ventricle: Left ventricular ejection fraction, by estimation, is 25 to 30%. Left ventricular ejection fraction by 3D volume is 26 %. The left ventricle has severely decreased function. The left ventricle demonstrates regional wall motion abnormalities. The left ventricular internal cavity size was normal in size. There is no left ventricular hypertrophy. Left ventricular diastolic parameters are consistent with Grade III diastolic dysfunction (restrictive).  LV Wall Scoring: The entire anterior wall, antero-lateral wall, entire anterior septum, and apical lateral segment are akinetic. The entire inferior wall, posterior wall, mid inferoseptal segment, and basal inferoseptal segment are hypokinetic. Right Ventricle: The right ventricular size is normal. No increase in right ventricular wall thickness. Right ventricular systolic function is normal. Tricuspid regurgitation signal is  inadequate for assessing PA pressure. Left Atrium: Left atrial size was normal in size. Right Atrium: Right atrial size was normal in size. Pericardium: Trivial pericardial effusion is present. The pericardial effusion is circumferential. Mitral Valve: The mitral valve is normal in structure. Trivial mitral valve regurgitation. No evidence of mitral valve stenosis. Tricuspid Valve: The tricuspid valve is normal in structure. Tricuspid valve regurgitation is not demonstrated. No evidence of tricuspid stenosis. Aortic Valve: The aortic valve is normal in structure. Aortic valve regurgitation is not visualized. Aortic valve sclerosis is present, with no evidence of aortic valve stenosis. Pulmonic Valve: The pulmonic valve was normal in structure. Pulmonic valve regurgitation is not visualized. No evidence of pulmonic stenosis. Aorta: The aortic root is normal in size and structure. Venous: The inferior vena cava is dilated in size with greater than 50% respiratory variability, suggesting right atrial pressure of 8 mmHg. IAS/Shunts: No atrial level shunt detected by color flow Doppler.  LEFT VENTRICLE PLAX 2D LVIDd:         5.30 cm         Diastology LVIDs:         4.70 cm         LV e' medial:    4.82 cm/s LV PW:         1.20 cm         LV E/e' medial:  20.6 LV IVS:        0.80 cm         LV e' lateral:   6.66 cm/s LVOT diam:     2.00 cm         LV E/e' lateral: 14.9 LV SV:         74 LV SV Index:   38 LVOT Area:     3.14 cm        3D Volume EF                                LV 3D EF:    Left                                             ventricul LV Volumes (MOD)  ar LV vol d, MOD    217.0 ml                   ejection A2C:                                        fraction LV vol d, MOD    180.0 ml                   by 3D A4C:                                        volume is LV vol s, MOD    158.0 ml                   26 %. A2C: LV vol s, MOD    135.0 ml A4C:                           3D Volume  EF: LV SV MOD A2C:   59.0 ml       3D EF:        26 % LV SV MOD A4C:   180.0 ml      LV EDV:       200 ml LV SV MOD BP:    52.7 ml       LV ESV:       147 ml                                LV SV:        53 ml RIGHT VENTRICLE RV S prime:     14.00 cm/s TAPSE (M-mode): 2.3 cm LEFT ATRIUM             Index        RIGHT ATRIUM           Index LA diam:        3.50 cm 1.79 cm/m   RA Area:     18.90 cm LA Vol (A2C):   59.4 ml 30.44 ml/m  RA Volume:   52.30 ml  26.80 ml/m LA Vol (A4C):   63.4 ml 32.49 ml/m LA Biplane Vol: 61.0 ml 31.26 ml/m  AORTIC VALVE LVOT Vmax:   120.00 cm/s LVOT Vmean:  80.800 cm/s LVOT VTI:    0.234 m  AORTA Ao Root diam: 3.30 cm Ao Asc diam:  3.00 cm MITRAL VALVE MV Area (PHT): 3.34 cm     SHUNTS MV Decel Time: 227 msec     Systemic VTI:  0.23 m MV E velocity: 99.20 cm/s   Systemic Diam: 2.00 cm MV A velocity: 132.00 cm/s MV E/A ratio:  0.75 Kardie Tobb DO Electronically signed by Berniece Salines DO Signature Date/Time: 07/30/2021/1:19:38 PM    Final      Subjective: No acute issues or events overnight   Discharge Exam: Vitals:   08/03/21 0804 08/03/21 0831  BP: (!) 142/56   Pulse: 100   Resp: 16   Temp: (!) 97.5 F (36.4 C)   SpO2: 90% 94%   Vitals:   08/02/21 2109 08/03/21 0449 08/03/21 0804 08/03/21 0831  BP:   (!) 142/56  Pulse:   100   Resp:   16   Temp:   (!) 97.5 F (36.4 C)   TempSrc:   Oral   SpO2: 92%  90% 94%  Weight:  73.7 kg    Height:        General: Pt is alert, awake, not in acute distress Cardiovascular: RRR, S1/S2 +, no rubs, no gallops Respiratory: CTA bilaterally, no wheezing, no rhonchi Abdominal: Soft, NT, ND, bowel sounds + Extremities: no edema, no cyanosis    The results of significant diagnostics from this hospitalization (including imaging, microbiology, ancillary and laboratory) are listed below for reference.     Microbiology: Recent Results (from the past 240 hour(s))  Resp Panel by RT-PCR (Flu A&B, Covid) Nasopharyngeal Swab      Status: Abnormal   Collection Time: 07/29/21  7:42 AM   Specimen: Nasopharyngeal Swab; Nasopharyngeal(NP) swabs in vial transport medium  Result Value Ref Range Status   SARS Coronavirus 2 by RT PCR NEGATIVE NEGATIVE Final    Comment: (NOTE) SARS-CoV-2 target nucleic acids are NOT DETECTED.  The SARS-CoV-2 RNA is generally detectable in upper respiratory specimens during the acute phase of infection. The lowest concentration of SARS-CoV-2 viral copies this assay can detect is 138 copies/mL. A negative result does not preclude SARS-Cov-2 infection and should not be used as the sole basis for treatment or other patient management decisions. A negative result may occur with  improper specimen collection/handling, submission of specimen other than nasopharyngeal swab, presence of viral mutation(s) within the areas targeted by this assay, and inadequate number of viral copies(<138 copies/mL). A negative result must be combined with clinical observations, patient history, and epidemiological information. The expected result is Negative.  Fact Sheet for Patients:  EntrepreneurPulse.com.au  Fact Sheet for Healthcare Providers:  IncredibleEmployment.be  This test is no t yet approved or cleared by the [place] FDA and  has been authorized for detection and/or diagnosis of SARS-CoV-2 by FDA under an Emergency Use Authorization (EUA). This EUA will remain  in effect (meaning this test can be used) for the duration of the COVID-19 declaration under Section 564(b)(1) of the Act, 21 U.S.C.section 360bbb-3(b)(1), unless the authorization is terminated  or revoked sooner.       Influenza A by PCR POSITIVE (A) NEGATIVE Final   Influenza B by PCR NEGATIVE NEGATIVE Final    Comment: (NOTE) The Xpert Xpress SARS-CoV-2/FLU/RSV plus assay is intended as an aid in the diagnosis of influenza from Nasopharyngeal swab specimens and should not be used as a  sole basis for treatment. Nasal washings and aspirates are unacceptable for Xpert Xpress SARS-CoV-2/FLU/RSV testing.  Fact Sheet for Patients: EntrepreneurPulse.com.au  Fact Sheet for Healthcare Providers: IncredibleEmployment.be  This test is not yet approved or cleared by the [place] FDA and has been authorized for detection and/or diagnosis of SARS-CoV-2 by FDA under an Emergency Use Authorization (EUA). This EUA will remain in effect (meaning this test can be used) for the duration of the COVID-19 declaration under Section 564(b)(1) of the Act, 21 U.S.C. section 360bbb-3(b)(1), unless the authorization is terminated or revoked.  Performed at [facility], [address] [place], [place] [address]   MRSA Next Gen by PCR, Nasal     Status: None   Collection Time: 07/29/21  3:47 PM   Specimen: Nasal Mucosa; Nasal Swab  Result Value Ref Range Status   MRSA by PCR Next Gen NOT DETECTED NOT DETECTED Final    Comment: (NOTE) The  GeneXpert MRSA Assay (FDA approved for NASAL specimens only), is one component of a comprehensive MRSA colonization surveillance program. It is not intended to diagnose MRSA infection nor to guide or monitor treatment for MRSA infections. Test performance is not FDA approved in patients less than 39 years old. Performed at [facility], [place] [address] [place], [place] [address]      Labs: BNP (last 3 results) Recent Labs    07/29/21 0742  BNP 093.8*   Basic Metabolic Panel: Recent Labs  Lab 07/30/21 0605 07/31/21 0153 08/01/21 0345 08/01/21 1327 08/02/21 0032 08/03/21 0712  NA 133* 136 136 135 136 135  K 4.6 5.1 5.7* 5.1 5.0 4.2  CL 101 102 104 100 98 100  CO2 22 24 24 26 30 27   GLUCOSE 164* 147* 73 134* 88 88  BUN 49* 44* 37* 32* 28* 28*  CREATININE 1.59* 1.22 0.95 1.01 0.84 0.90  CALCIUM 8.5* 8.6* 8.5* 8.4* 8.6* 8.6*  MG 2.1 2.4 2.2  --  2.0  --   PHOS 4.6 4.7* 3.0  --   2.3*  --    Liver Function Tests: No results for input(s): AST, ALT, ALKPHOS, BILITOT, PROT, ALBUMIN in the last 168 hours. No results for input(s): LIPASE, AMYLASE in the last 168 hours. No results for input(s): AMMONIA in the last 168 hours. CBC: Recent Labs  Lab 07/29/21 0820 07/29/21 1105 07/29/21 1728 08/03/21 0712  WBC 11.7*  --   --  6.5  NEUTROABS 9.6*  --   --   --   HGB 11.4* 13.6 11.9* 12.6*  HCT 36.0* 40.0 35.0* 38.3*  MCV 94.5  --   --  91.6  PLT 237  --   --  202   Cardiac Enzymes: No results for input(s): CKTOTAL, CKMB, CKMBINDEX, TROPONINI in the last 168 hours. BNP: Invalid input(s): POCBNP CBG: Recent Labs  Lab 08/02/21 2348 08/03/21 0340 08/03/21 0523 08/03/21 0809 08/03/21 1148  GLUCAP 100* 85 105* 112* 121*   D-Dimer No results for input(s): DDIMER in the last 72 hours. Hgb A1c No results for input(s): HGBA1C in the last 72 hours. Lipid Profile No results for input(s): CHOL, HDL, LDLCALC, TRIG, CHOLHDL, LDLDIRECT in the last 72 hours. Thyroid function studies No results for input(s): TSH, T4TOTAL, T3FREE, THYROIDAB in the last 72 hours.  Invalid input(s): FREET3 Anemia work up No results for input(s): VITAMINB12, FOLATE, FERRITIN, TIBC, IRON, RETICCTPCT in the last 72 hours. Urinalysis    Component Value Date/Time   COLORURINE YELLOW 07/29/2021 1252   APPEARANCEUR CLEAR 07/29/2021 1252   LABSPEC 1.015 07/29/2021 1252   PHURINE 5.0 07/29/2021 1252   GLUCOSEU NEGATIVE 07/29/2021 1252   HGBUR MODERATE (A) 07/29/2021 1252   BILIRUBINUR NEGATIVE 07/29/2021 1252   KETONESUR NEGATIVE 07/29/2021 1252   PROTEINUR 30 (A) 07/29/2021 1252   NITRITE NEGATIVE 07/29/2021 1252   LEUKOCYTESUR NEGATIVE 07/29/2021 1252   Sepsis Labs Invalid input(s): PROCALCITONIN,  WBC,  LACTICIDVEN Microbiology Recent Results (from the past 240 hour(s))  Resp Panel by RT-PCR (Flu A&B, Covid) Nasopharyngeal Swab     Status: Abnormal   Collection Time: 07/29/21   7:42 AM   Specimen: Nasopharyngeal Swab; Nasopharyngeal(NP) swabs in vial transport medium  Result Value Ref Range Status   SARS Coronavirus 2 by RT PCR NEGATIVE NEGATIVE Final    Comment: (NOTE) SARS-CoV-2 target nucleic acids are NOT DETECTED.  The SARS-CoV-2 RNA is generally detectable in upper respiratory specimens during the acute phase of infection. The  lowest concentration of SARS-CoV-2 viral copies this assay can detect is 138 copies/mL. A negative result does not preclude SARS-Cov-2 infection and should not be used as the sole basis for treatment or other patient management decisions. A negative result may occur with  improper specimen collection/handling, submission of specimen other than nasopharyngeal swab, presence of viral mutation(s) within the areas targeted by this assay, and inadequate number of viral copies(<138 copies/mL). A negative result must be combined with clinical observations, patient history, and epidemiological information. The expected result is Negative.  Fact Sheet for Patients:  EntrepreneurPulse.com.au  Fact Sheet for Healthcare Providers:  IncredibleEmployment.be  This test is no t yet approved or cleared by the [place] FDA and  has been authorized for detection and/or diagnosis of SARS-CoV-2 by FDA under an Emergency Use Authorization (EUA). This EUA will remain  in effect (meaning this test can be used) for the duration of the COVID-19 declaration under Section 564(b)(1) of the Act, 21 U.S.C.section 360bbb-3(b)(1), unless the authorization is terminated  or revoked sooner.       Influenza A by PCR POSITIVE (A) NEGATIVE Final   Influenza B by PCR NEGATIVE NEGATIVE Final    Comment: (NOTE) The Xpert Xpress SARS-CoV-2/FLU/RSV plus assay is intended as an aid in the diagnosis of influenza from Nasopharyngeal swab specimens and should not be used as a sole basis for treatment. Nasal washings  and aspirates are unacceptable for Xpert Xpress SARS-CoV-2/FLU/RSV testing.  Fact Sheet for Patients: EntrepreneurPulse.com.au  Fact Sheet for Healthcare Providers: IncredibleEmployment.be  This test is not yet approved or cleared by the [place] FDA and has been authorized for detection and/or diagnosis of SARS-CoV-2 by FDA under an Emergency Use Authorization (EUA). This EUA will remain in effect (meaning this test can be used) for the duration of the COVID-19 declaration under Section 564(b)(1) of the Act, 21 U.S.C. section 360bbb-3(b)(1), unless the authorization is terminated or revoked.  Performed at [facility], [address] [place], [place] [address]   MRSA Next Gen by PCR, Nasal     Status: None   Collection Time: 07/29/21  3:47 PM   Specimen: Nasal Mucosa; Nasal Swab  Result Value Ref Range Status   MRSA by PCR Next Gen NOT DETECTED NOT DETECTED Final    Comment: (NOTE) The GeneXpert MRSA Assay (FDA approved for NASAL specimens only), is one component of a comprehensive MRSA colonization surveillance program. It is not intended to diagnose MRSA infection nor to guide or monitor treatment for MRSA infections. Test performance is not FDA approved in patients less than 38 years old. Performed at [facility], [place] [address], [place], [place] [address]      Time coordinating discharge: Over 30 minutes  SIGNED:   Little Ishikawa, DO [facility] 08/03/2021, 2:12 PM Pager   If 7PM-7AM, please contact night-coverage [email or web address]

## 2021-08-03 NOTE — Progress Notes (Signed)
SATURATION QUALIFICATIONS: (This note is used to comply with regulatory documentation for home oxygen)  Patient Saturations on Room Air at Rest = 94%  Patient Saturations on Room Air while Ambulating = 87%  Patient Saturations on 2 Liters of oxygen while Ambulating = 91%  Please briefly explain why patient needs home oxygen:

## 2021-08-04 ENCOUNTER — Other Ambulatory Visit: Payer: Self-pay

## 2021-08-04 DIAGNOSIS — J9621 Acute and chronic respiratory failure with hypoxia: Secondary | ICD-10-CM

## 2021-08-04 DIAGNOSIS — J432 Centrilobular emphysema: Secondary | ICD-10-CM

## 2021-08-04 DIAGNOSIS — J9622 Acute and chronic respiratory failure with hypercapnia: Secondary | ICD-10-CM

## 2021-08-04 DIAGNOSIS — I119 Hypertensive heart disease without heart failure: Secondary | ICD-10-CM

## 2021-08-05 DIAGNOSIS — J310 Chronic rhinitis: Secondary | ICD-10-CM | POA: Diagnosis not present

## 2021-08-05 DIAGNOSIS — J45909 Unspecified asthma, uncomplicated: Secondary | ICD-10-CM | POA: Diagnosis not present

## 2021-08-05 DIAGNOSIS — J439 Emphysema, unspecified: Secondary | ICD-10-CM | POA: Diagnosis not present

## 2021-08-05 DIAGNOSIS — J9601 Acute respiratory failure with hypoxia: Secondary | ICD-10-CM | POA: Diagnosis not present

## 2021-08-05 DIAGNOSIS — J9621 Acute and chronic respiratory failure with hypoxia: Secondary | ICD-10-CM | POA: Diagnosis not present

## 2021-08-05 DIAGNOSIS — Z85038 Personal history of other malignant neoplasm of large intestine: Secondary | ICD-10-CM | POA: Diagnosis not present

## 2021-08-05 DIAGNOSIS — J449 Chronic obstructive pulmonary disease, unspecified: Secondary | ICD-10-CM | POA: Diagnosis not present

## 2021-08-05 DIAGNOSIS — I951 Orthostatic hypotension: Secondary | ICD-10-CM | POA: Diagnosis not present

## 2021-08-05 DIAGNOSIS — Z7951 Long term (current) use of inhaled steroids: Secondary | ICD-10-CM | POA: Diagnosis not present

## 2021-08-05 DIAGNOSIS — Z9981 Dependence on supplemental oxygen: Secondary | ICD-10-CM | POA: Diagnosis not present

## 2021-08-05 DIAGNOSIS — K5730 Diverticulosis of large intestine without perforation or abscess without bleeding: Secondary | ICD-10-CM | POA: Diagnosis not present

## 2021-08-05 DIAGNOSIS — Z7982 Long term (current) use of aspirin: Secondary | ICD-10-CM | POA: Diagnosis not present

## 2021-08-05 DIAGNOSIS — I83899 Varicose veins of unspecified lower extremities with other complications: Secondary | ICD-10-CM | POA: Diagnosis not present

## 2021-08-05 DIAGNOSIS — Z87442 Personal history of urinary calculi: Secondary | ICD-10-CM | POA: Diagnosis not present

## 2021-08-05 DIAGNOSIS — Z87891 Personal history of nicotine dependence: Secondary | ICD-10-CM | POA: Diagnosis not present

## 2021-08-05 DIAGNOSIS — I428 Other cardiomyopathies: Secondary | ICD-10-CM | POA: Diagnosis not present

## 2021-08-05 DIAGNOSIS — I878 Other specified disorders of veins: Secondary | ICD-10-CM | POA: Diagnosis not present

## 2021-08-05 DIAGNOSIS — J9622 Acute and chronic respiratory failure with hypercapnia: Secondary | ICD-10-CM | POA: Diagnosis not present

## 2021-08-05 DIAGNOSIS — E119 Type 2 diabetes mellitus without complications: Secondary | ICD-10-CM | POA: Diagnosis not present

## 2021-08-05 DIAGNOSIS — G9340 Encephalopathy, unspecified: Secondary | ICD-10-CM | POA: Diagnosis not present

## 2021-08-05 DIAGNOSIS — I119 Hypertensive heart disease without heart failure: Secondary | ICD-10-CM | POA: Diagnosis not present

## 2021-08-05 DIAGNOSIS — E785 Hyperlipidemia, unspecified: Secondary | ICD-10-CM | POA: Diagnosis not present

## 2021-08-05 DIAGNOSIS — D649 Anemia, unspecified: Secondary | ICD-10-CM | POA: Diagnosis not present

## 2021-08-06 DIAGNOSIS — J439 Emphysema, unspecified: Secondary | ICD-10-CM | POA: Diagnosis not present

## 2021-08-06 DIAGNOSIS — J45909 Unspecified asthma, uncomplicated: Secondary | ICD-10-CM | POA: Diagnosis not present

## 2021-08-06 DIAGNOSIS — D649 Anemia, unspecified: Secondary | ICD-10-CM | POA: Diagnosis not present

## 2021-08-06 DIAGNOSIS — I83899 Varicose veins of unspecified lower extremities with other complications: Secondary | ICD-10-CM | POA: Diagnosis not present

## 2021-08-06 DIAGNOSIS — I119 Hypertensive heart disease without heart failure: Secondary | ICD-10-CM | POA: Diagnosis not present

## 2021-08-06 DIAGNOSIS — I878 Other specified disorders of veins: Secondary | ICD-10-CM | POA: Diagnosis not present

## 2021-08-06 DIAGNOSIS — E119 Type 2 diabetes mellitus without complications: Secondary | ICD-10-CM | POA: Diagnosis not present

## 2021-08-06 DIAGNOSIS — Z85038 Personal history of other malignant neoplasm of large intestine: Secondary | ICD-10-CM | POA: Diagnosis not present

## 2021-08-06 DIAGNOSIS — Z7982 Long term (current) use of aspirin: Secondary | ICD-10-CM | POA: Diagnosis not present

## 2021-08-06 DIAGNOSIS — I951 Orthostatic hypotension: Secondary | ICD-10-CM | POA: Diagnosis not present

## 2021-08-06 DIAGNOSIS — J9621 Acute and chronic respiratory failure with hypoxia: Secondary | ICD-10-CM | POA: Diagnosis not present

## 2021-08-06 DIAGNOSIS — J9622 Acute and chronic respiratory failure with hypercapnia: Secondary | ICD-10-CM | POA: Diagnosis not present

## 2021-08-06 DIAGNOSIS — Z7951 Long term (current) use of inhaled steroids: Secondary | ICD-10-CM | POA: Diagnosis not present

## 2021-08-06 DIAGNOSIS — J310 Chronic rhinitis: Secondary | ICD-10-CM | POA: Diagnosis not present

## 2021-08-06 DIAGNOSIS — Z87891 Personal history of nicotine dependence: Secondary | ICD-10-CM | POA: Diagnosis not present

## 2021-08-06 DIAGNOSIS — K5730 Diverticulosis of large intestine without perforation or abscess without bleeding: Secondary | ICD-10-CM | POA: Diagnosis not present

## 2021-08-06 DIAGNOSIS — E785 Hyperlipidemia, unspecified: Secondary | ICD-10-CM | POA: Diagnosis not present

## 2021-08-06 DIAGNOSIS — G9340 Encephalopathy, unspecified: Secondary | ICD-10-CM | POA: Diagnosis not present

## 2021-08-06 DIAGNOSIS — Z9981 Dependence on supplemental oxygen: Secondary | ICD-10-CM | POA: Diagnosis not present

## 2021-08-06 DIAGNOSIS — Z87442 Personal history of urinary calculi: Secondary | ICD-10-CM | POA: Diagnosis not present

## 2021-08-06 DIAGNOSIS — I428 Other cardiomyopathies: Secondary | ICD-10-CM | POA: Diagnosis not present

## 2021-08-07 ENCOUNTER — Other Ambulatory Visit: Payer: Self-pay | Admitting: *Deleted

## 2021-08-07 ENCOUNTER — Encounter: Payer: Self-pay | Admitting: *Deleted

## 2021-08-07 DIAGNOSIS — E119 Type 2 diabetes mellitus without complications: Secondary | ICD-10-CM | POA: Diagnosis not present

## 2021-08-07 DIAGNOSIS — Z7982 Long term (current) use of aspirin: Secondary | ICD-10-CM | POA: Diagnosis not present

## 2021-08-07 DIAGNOSIS — N179 Acute kidney failure, unspecified: Secondary | ICD-10-CM | POA: Diagnosis not present

## 2021-08-07 DIAGNOSIS — D649 Anemia, unspecified: Secondary | ICD-10-CM | POA: Diagnosis not present

## 2021-08-07 DIAGNOSIS — I428 Other cardiomyopathies: Secondary | ICD-10-CM | POA: Diagnosis not present

## 2021-08-07 DIAGNOSIS — G9340 Encephalopathy, unspecified: Secondary | ICD-10-CM | POA: Diagnosis not present

## 2021-08-07 DIAGNOSIS — Z87442 Personal history of urinary calculi: Secondary | ICD-10-CM | POA: Diagnosis not present

## 2021-08-07 DIAGNOSIS — I83899 Varicose veins of unspecified lower extremities with other complications: Secondary | ICD-10-CM | POA: Diagnosis not present

## 2021-08-07 DIAGNOSIS — J441 Chronic obstructive pulmonary disease with (acute) exacerbation: Secondary | ICD-10-CM

## 2021-08-07 DIAGNOSIS — J310 Chronic rhinitis: Secondary | ICD-10-CM | POA: Diagnosis not present

## 2021-08-07 DIAGNOSIS — Z7951 Long term (current) use of inhaled steroids: Secondary | ICD-10-CM | POA: Diagnosis not present

## 2021-08-07 DIAGNOSIS — I878 Other specified disorders of veins: Secondary | ICD-10-CM | POA: Diagnosis not present

## 2021-08-07 DIAGNOSIS — Z9981 Dependence on supplemental oxygen: Secondary | ICD-10-CM | POA: Diagnosis not present

## 2021-08-07 DIAGNOSIS — H9193 Unspecified hearing loss, bilateral: Secondary | ICD-10-CM | POA: Diagnosis not present

## 2021-08-07 DIAGNOSIS — Z87891 Personal history of nicotine dependence: Secondary | ICD-10-CM | POA: Diagnosis not present

## 2021-08-07 DIAGNOSIS — I951 Orthostatic hypotension: Secondary | ICD-10-CM | POA: Diagnosis not present

## 2021-08-07 DIAGNOSIS — J45909 Unspecified asthma, uncomplicated: Secondary | ICD-10-CM | POA: Diagnosis not present

## 2021-08-07 DIAGNOSIS — Z85038 Personal history of other malignant neoplasm of large intestine: Secondary | ICD-10-CM | POA: Diagnosis not present

## 2021-08-07 DIAGNOSIS — J439 Emphysema, unspecified: Secondary | ICD-10-CM | POA: Diagnosis not present

## 2021-08-07 DIAGNOSIS — J9622 Acute and chronic respiratory failure with hypercapnia: Secondary | ICD-10-CM | POA: Diagnosis not present

## 2021-08-07 DIAGNOSIS — K5730 Diverticulosis of large intestine without perforation or abscess without bleeding: Secondary | ICD-10-CM | POA: Diagnosis not present

## 2021-08-07 DIAGNOSIS — E1159 Type 2 diabetes mellitus with other circulatory complications: Secondary | ICD-10-CM | POA: Diagnosis not present

## 2021-08-07 DIAGNOSIS — E785 Hyperlipidemia, unspecified: Secondary | ICD-10-CM | POA: Diagnosis not present

## 2021-08-07 DIAGNOSIS — I429 Cardiomyopathy, unspecified: Secondary | ICD-10-CM | POA: Diagnosis not present

## 2021-08-07 DIAGNOSIS — I119 Hypertensive heart disease without heart failure: Secondary | ICD-10-CM | POA: Diagnosis not present

## 2021-08-07 DIAGNOSIS — J9621 Acute and chronic respiratory failure with hypoxia: Secondary | ICD-10-CM | POA: Diagnosis not present

## 2021-08-07 NOTE — Patient Outreach (Signed)
[facility]) Care Management  08/07/2021  Harry Norris [birth year] [id number]  Referral for medication assistance from Raina Mina, RN sent to [place].  Ina [facility] Management Assistant [phone]

## 2021-08-07 NOTE — Patient Outreach (Signed)
[name]) Care Management  [facility] Care Manager  08/07/2021   Harry Norris 02-06-1936 [id number]  Spoke with primary caregiver son Harry Norris today who provided the initial assessment. Pt lives alone and is currently receiving [facility] services from [name]. Son indicated agency is also working on getting pt some assistance through [name] for aids services. Son encouraged to apply for VA benefits for pt due to his [name]. Son states he is aware of someone at the pt's bank that he can work with to assist with these benefits. All discussed information will be reiterated to the pt for adherence to the agreed plan of care.  Encounter Medications:  Outpatient Encounter Medications as of 08/07/2021  Medication Sig   albuterol (VENTOLIN HFA) 108 (90 Base) MCG/ACT inhaler Inhale 2 puffs into the lungs every 6 (six) hours as needed for wheezing or shortness of breath.   aspirin EC 81 MG tablet Take 81 mg by mouth daily. Swallow whole.   atorvastatin (LIPITOR) 40 MG tablet Take 40 mg by mouth at bedtime.   carvedilol (COREG) 12.5 MG tablet Take 12.5 mg by mouth in the morning and at bedtime.   fenofibrate 160 MG tablet Take 160 mg by mouth daily.   furosemide (LASIX) 40 MG tablet Take 40 mg by mouth in the morning.   Multiple Vitamins-Minerals (CENTRUM SILVER 50+MEN) TABS Take 1 tablet by mouth daily with breakfast.   naproxen (NAPROSYN) 500 MG tablet Take 500 mg by mouth daily as needed (for pain).   Omega-3 Fatty Acids (FISH OIL) 1000 MG CAPS Take 1,000 mg by mouth 2 (two) times daily.   OXYGEN Inhale 2 L/min into the lungs as needed (for shortness of breath).   oxymetazoline (AFRIN) 0.05 % nasal spray Place 1 spray into both nostrils 2 (two) times daily as needed for congestion.   vitamin C (ASCORBIC ACID) 500 MG tablet Take 500 mg by mouth daily.   vitamin E 400 UNIT capsule Take 400 Units by mouth daily.   arformoterol (BROVANA)  15 MCG/2ML NEBU Take 2 mLs (15 mcg total) by nebulization 2 (two) times daily. (Patient not taking: Reported on 08/07/2021)   budesonide (PULMICORT) 180 MCG/ACT inhaler Inhale 2 puffs into the lungs 2 (two) times daily. (Patient not taking: Reported on 07/29/2021)   revefenacin (YUPELRI) 175 MCG/3ML nebulizer solution Take 3 mLs (175 mcg total) by nebulization daily. (Patient not taking: Reported on 08/07/2021)   No facility-administered encounter medications on file as of 08/07/2021.    Functional Status:  In your present state of health, do you have any difficulty performing the following activities: 08/07/2021 08/02/2021  Hearing? [name]? N N  Difficulty concentrating or making decisions? [name] withthis task -  Walking or climbing stairs? Y Y  Comment Uses rolator -  Dressing or bathing? [name]  Comment assistance witht his task -  Doing errands, shopping? [name] with this task -  Preparing Food and eating ? N -  Using the Toilet? N -  In the past six months, have you accidently leaked urine? N -  Do you have problems with loss of bowel control? Y -  Comment assistance with this task -  Managing your Medications? [name] with this task -  Managing your Finances? [name] with this task -  Housekeeping or managing your Housekeeping? N -  Some recent data might be hidden    Fall/Depression Screening: Fall Risk  08/07/2021 08/07/2021  Falls in the past year? 0 0  Risk for fall due to : - No Fall Risks  Follow up - Education provided   [name] 2/9 Scores 08/07/2021  PHQ - 2 Score 0    Assessment:   Care Plan Care Plan : [name]  Updates made by [name], RN since 08/07/2021 12:00 AM     Problem: Knowledge Deficit related to COPD and care coordination needs   Priority: High     Long-Range Goal: Development of care for management of COPD   Start Date: 08/07/2021   Expected End Date: 11/28/2021  Priority: High  Note:   Current Barriers:  Knowledge Deficits related to plan of care for management of COPD   RNCM Clinical Goal(s):  Patient will verbalize understanding of plan for management of COPD as evidenced by awareness of what to do if exacerbated symptoms are encountered. continue to work with RN Care Manager to address care management and care coordination needs related to  COPD as evidenced by adherence to CM Team Scheduled appointments through collaboration with RN Care manager, provider, and care team.   Interventions: Inter-disciplinary care team collaboration (see longitudinal plan of care) Evaluation of current treatment plan related to  self management and patient's adherence to plan as established by provider   COPD Interventions:  (Status:  New goal.) Long Term Goal Provided patient with basic written and verbal COPD education on self care/management/and exacerbation prevention Advised patient to track and manage COPD triggers Provided instruction about proper use of medications used for management of COPD including inhalers Advised patient to self assesses COPD action plan zone and make appointment with provider if in the yellow zone for 48 hours without improvement Advised patient to engage in light exercise as tolerated 3-5 days a week to aid in the the management of COPD Provided education about and advised patient to utilize infection prevention strategies to reduce risk of respiratory infection Discussed the importance of adequate rest and management of fatigue with COPD Referral made to community resources care guide team for assistance with secondary source of transportation. Also made a referral for pharmacy to assist with electronic pillbox dispenser with timer.   Patient Goals/Self-Care Activities: Take all medications as prescribed Attend all scheduled provider appointments Call pharmacy for medication refills 3-7 days in  advance of running out of medications Perform all self care activities independently  Perform IADL's (shopping, preparing meals, housekeeping, managing finances) independently Call provider office for new concerns or questions   Follow Up Plan:  Telephone follow up appointment with care management team member scheduled for:  Jan 2023 The patient has been provided with contact information for the care management team and has been advised to call with any health related questions or concerns.  Next PCP appointment scheduled for:  [facility] will follow up with the patient and will provide direct communication to the PCP for this patient.       Raina Mina, RN Care Management Coordinator [place] Office [phone]

## 2021-08-07 NOTE — Patient Instructions (Signed)
Visit Information  Thank you for taking time to visit with me today. Please don't hesitate to contact me if I can be of assistance to you before our next scheduled telephone appointment.    

## 2021-08-08 DIAGNOSIS — G9340 Encephalopathy, unspecified: Secondary | ICD-10-CM | POA: Diagnosis not present

## 2021-08-08 DIAGNOSIS — Z9981 Dependence on supplemental oxygen: Secondary | ICD-10-CM | POA: Diagnosis not present

## 2021-08-08 DIAGNOSIS — E119 Type 2 diabetes mellitus without complications: Secondary | ICD-10-CM | POA: Diagnosis not present

## 2021-08-08 DIAGNOSIS — I951 Orthostatic hypotension: Secondary | ICD-10-CM | POA: Diagnosis not present

## 2021-08-08 DIAGNOSIS — Z87891 Personal history of nicotine dependence: Secondary | ICD-10-CM | POA: Diagnosis not present

## 2021-08-08 DIAGNOSIS — Z7982 Long term (current) use of aspirin: Secondary | ICD-10-CM | POA: Diagnosis not present

## 2021-08-08 DIAGNOSIS — I878 Other specified disorders of veins: Secondary | ICD-10-CM | POA: Diagnosis not present

## 2021-08-08 DIAGNOSIS — D649 Anemia, unspecified: Secondary | ICD-10-CM | POA: Diagnosis not present

## 2021-08-08 DIAGNOSIS — J45909 Unspecified asthma, uncomplicated: Secondary | ICD-10-CM | POA: Diagnosis not present

## 2021-08-08 DIAGNOSIS — Z85038 Personal history of other malignant neoplasm of large intestine: Secondary | ICD-10-CM | POA: Diagnosis not present

## 2021-08-08 DIAGNOSIS — I428 Other cardiomyopathies: Secondary | ICD-10-CM | POA: Diagnosis not present

## 2021-08-08 DIAGNOSIS — J310 Chronic rhinitis: Secondary | ICD-10-CM | POA: Diagnosis not present

## 2021-08-08 DIAGNOSIS — E785 Hyperlipidemia, unspecified: Secondary | ICD-10-CM | POA: Diagnosis not present

## 2021-08-08 DIAGNOSIS — I83899 Varicose veins of unspecified lower extremities with other complications: Secondary | ICD-10-CM | POA: Diagnosis not present

## 2021-08-08 DIAGNOSIS — Z87442 Personal history of urinary calculi: Secondary | ICD-10-CM | POA: Diagnosis not present

## 2021-08-08 DIAGNOSIS — J439 Emphysema, unspecified: Secondary | ICD-10-CM | POA: Diagnosis not present

## 2021-08-08 DIAGNOSIS — Z7951 Long term (current) use of inhaled steroids: Secondary | ICD-10-CM | POA: Diagnosis not present

## 2021-08-08 DIAGNOSIS — K5730 Diverticulosis of large intestine without perforation or abscess without bleeding: Secondary | ICD-10-CM | POA: Diagnosis not present

## 2021-08-08 DIAGNOSIS — J9621 Acute and chronic respiratory failure with hypoxia: Secondary | ICD-10-CM | POA: Diagnosis not present

## 2021-08-08 DIAGNOSIS — I119 Hypertensive heart disease without heart failure: Secondary | ICD-10-CM | POA: Diagnosis not present

## 2021-08-08 DIAGNOSIS — J9622 Acute and chronic respiratory failure with hypercapnia: Secondary | ICD-10-CM | POA: Diagnosis not present

## 2021-08-12 ENCOUNTER — Telehealth: Payer: Self-pay | Admitting: *Deleted

## 2021-08-12 NOTE — Telephone Encounter (Signed)
° °  Telephone encounter was:  Successful.  08/12/2021 Name: Harry Norris MRN: [id number] DOB: [birth year]  Harry Norris is a 85 y.o. year old male who is a primary care patient of [name], MD . The community resource team was consulted for assistance with Transportation Needs Informed patient of Transportaion benefit from united [employer] care the number is on his card and he has no scheduled appts right now so he will pass it on to his [place] guide performed the following interventions: Patient provided with information about care guide support team and interviewed to confirm resource needs Follow up call placed to community resources to determine status of patients referral.  Follow Up Plan:  No further follow up planned at this time. The patient has been provided with needed resources.  [place], Care Management  [phone] [address] , [place] [address] Email : [email or web address]

## 2021-08-13 DIAGNOSIS — I951 Orthostatic hypotension: Secondary | ICD-10-CM | POA: Diagnosis not present

## 2021-08-13 DIAGNOSIS — J310 Chronic rhinitis: Secondary | ICD-10-CM | POA: Diagnosis not present

## 2021-08-13 DIAGNOSIS — Z87891 Personal history of nicotine dependence: Secondary | ICD-10-CM | POA: Diagnosis not present

## 2021-08-13 DIAGNOSIS — I119 Hypertensive heart disease without heart failure: Secondary | ICD-10-CM | POA: Diagnosis not present

## 2021-08-13 DIAGNOSIS — Z7982 Long term (current) use of aspirin: Secondary | ICD-10-CM | POA: Diagnosis not present

## 2021-08-13 DIAGNOSIS — K5730 Diverticulosis of large intestine without perforation or abscess without bleeding: Secondary | ICD-10-CM | POA: Diagnosis not present

## 2021-08-13 DIAGNOSIS — J439 Emphysema, unspecified: Secondary | ICD-10-CM | POA: Diagnosis not present

## 2021-08-13 DIAGNOSIS — Z87442 Personal history of urinary calculi: Secondary | ICD-10-CM | POA: Diagnosis not present

## 2021-08-13 DIAGNOSIS — I878 Other specified disorders of veins: Secondary | ICD-10-CM | POA: Diagnosis not present

## 2021-08-13 DIAGNOSIS — J9621 Acute and chronic respiratory failure with hypoxia: Secondary | ICD-10-CM | POA: Diagnosis not present

## 2021-08-13 DIAGNOSIS — D649 Anemia, unspecified: Secondary | ICD-10-CM | POA: Diagnosis not present

## 2021-08-13 DIAGNOSIS — E785 Hyperlipidemia, unspecified: Secondary | ICD-10-CM | POA: Diagnosis not present

## 2021-08-13 DIAGNOSIS — J9622 Acute and chronic respiratory failure with hypercapnia: Secondary | ICD-10-CM | POA: Diagnosis not present

## 2021-08-13 DIAGNOSIS — I428 Other cardiomyopathies: Secondary | ICD-10-CM | POA: Diagnosis not present

## 2021-08-13 DIAGNOSIS — Z85038 Personal history of other malignant neoplasm of large intestine: Secondary | ICD-10-CM | POA: Diagnosis not present

## 2021-08-13 DIAGNOSIS — E119 Type 2 diabetes mellitus without complications: Secondary | ICD-10-CM | POA: Diagnosis not present

## 2021-08-13 DIAGNOSIS — Z7951 Long term (current) use of inhaled steroids: Secondary | ICD-10-CM | POA: Diagnosis not present

## 2021-08-13 DIAGNOSIS — Z9981 Dependence on supplemental oxygen: Secondary | ICD-10-CM | POA: Diagnosis not present

## 2021-08-13 DIAGNOSIS — G9340 Encephalopathy, unspecified: Secondary | ICD-10-CM | POA: Diagnosis not present

## 2021-08-13 DIAGNOSIS — J45909 Unspecified asthma, uncomplicated: Secondary | ICD-10-CM | POA: Diagnosis not present

## 2021-08-13 DIAGNOSIS — I83899 Varicose veins of unspecified lower extremities with other complications: Secondary | ICD-10-CM | POA: Diagnosis not present

## 2021-08-15 DIAGNOSIS — Z87442 Personal history of urinary calculi: Secondary | ICD-10-CM | POA: Diagnosis not present

## 2021-08-15 DIAGNOSIS — I428 Other cardiomyopathies: Secondary | ICD-10-CM | POA: Diagnosis not present

## 2021-08-15 DIAGNOSIS — Z85038 Personal history of other malignant neoplasm of large intestine: Secondary | ICD-10-CM | POA: Diagnosis not present

## 2021-08-15 DIAGNOSIS — Z7951 Long term (current) use of inhaled steroids: Secondary | ICD-10-CM | POA: Diagnosis not present

## 2021-08-15 DIAGNOSIS — Z9981 Dependence on supplemental oxygen: Secondary | ICD-10-CM | POA: Diagnosis not present

## 2021-08-15 DIAGNOSIS — J45909 Unspecified asthma, uncomplicated: Secondary | ICD-10-CM | POA: Diagnosis not present

## 2021-08-15 DIAGNOSIS — Z7982 Long term (current) use of aspirin: Secondary | ICD-10-CM | POA: Diagnosis not present

## 2021-08-15 DIAGNOSIS — I951 Orthostatic hypotension: Secondary | ICD-10-CM | POA: Diagnosis not present

## 2021-08-15 DIAGNOSIS — J9622 Acute and chronic respiratory failure with hypercapnia: Secondary | ICD-10-CM | POA: Diagnosis not present

## 2021-08-15 DIAGNOSIS — I119 Hypertensive heart disease without heart failure: Secondary | ICD-10-CM | POA: Diagnosis not present

## 2021-08-15 DIAGNOSIS — I83899 Varicose veins of unspecified lower extremities with other complications: Secondary | ICD-10-CM | POA: Diagnosis not present

## 2021-08-15 DIAGNOSIS — I878 Other specified disorders of veins: Secondary | ICD-10-CM | POA: Diagnosis not present

## 2021-08-15 DIAGNOSIS — Z87891 Personal history of nicotine dependence: Secondary | ICD-10-CM | POA: Diagnosis not present

## 2021-08-15 DIAGNOSIS — K5730 Diverticulosis of large intestine without perforation or abscess without bleeding: Secondary | ICD-10-CM | POA: Diagnosis not present

## 2021-08-15 DIAGNOSIS — E119 Type 2 diabetes mellitus without complications: Secondary | ICD-10-CM | POA: Diagnosis not present

## 2021-08-15 DIAGNOSIS — J439 Emphysema, unspecified: Secondary | ICD-10-CM | POA: Diagnosis not present

## 2021-08-15 DIAGNOSIS — G9340 Encephalopathy, unspecified: Secondary | ICD-10-CM | POA: Diagnosis not present

## 2021-08-15 DIAGNOSIS — J9621 Acute and chronic respiratory failure with hypoxia: Secondary | ICD-10-CM | POA: Diagnosis not present

## 2021-08-15 DIAGNOSIS — E785 Hyperlipidemia, unspecified: Secondary | ICD-10-CM | POA: Diagnosis not present

## 2021-08-15 DIAGNOSIS — J310 Chronic rhinitis: Secondary | ICD-10-CM | POA: Diagnosis not present

## 2021-08-15 DIAGNOSIS — D649 Anemia, unspecified: Secondary | ICD-10-CM | POA: Diagnosis not present

## 2021-08-18 DIAGNOSIS — E785 Hyperlipidemia, unspecified: Secondary | ICD-10-CM | POA: Diagnosis not present

## 2021-08-18 DIAGNOSIS — Z7982 Long term (current) use of aspirin: Secondary | ICD-10-CM | POA: Diagnosis not present

## 2021-08-18 DIAGNOSIS — J310 Chronic rhinitis: Secondary | ICD-10-CM | POA: Diagnosis not present

## 2021-08-18 DIAGNOSIS — I951 Orthostatic hypotension: Secondary | ICD-10-CM | POA: Diagnosis not present

## 2021-08-18 DIAGNOSIS — Z7951 Long term (current) use of inhaled steroids: Secondary | ICD-10-CM | POA: Diagnosis not present

## 2021-08-18 DIAGNOSIS — E119 Type 2 diabetes mellitus without complications: Secondary | ICD-10-CM | POA: Diagnosis not present

## 2021-08-18 DIAGNOSIS — I119 Hypertensive heart disease without heart failure: Secondary | ICD-10-CM | POA: Diagnosis not present

## 2021-08-18 DIAGNOSIS — G9340 Encephalopathy, unspecified: Secondary | ICD-10-CM | POA: Diagnosis not present

## 2021-08-18 DIAGNOSIS — J45909 Unspecified asthma, uncomplicated: Secondary | ICD-10-CM | POA: Diagnosis not present

## 2021-08-18 DIAGNOSIS — I878 Other specified disorders of veins: Secondary | ICD-10-CM | POA: Diagnosis not present

## 2021-08-18 DIAGNOSIS — J9622 Acute and chronic respiratory failure with hypercapnia: Secondary | ICD-10-CM | POA: Diagnosis not present

## 2021-08-18 DIAGNOSIS — Z87891 Personal history of nicotine dependence: Secondary | ICD-10-CM | POA: Diagnosis not present

## 2021-08-18 DIAGNOSIS — Z87442 Personal history of urinary calculi: Secondary | ICD-10-CM | POA: Diagnosis not present

## 2021-08-18 DIAGNOSIS — J9621 Acute and chronic respiratory failure with hypoxia: Secondary | ICD-10-CM | POA: Diagnosis not present

## 2021-08-18 DIAGNOSIS — Z85038 Personal history of other malignant neoplasm of large intestine: Secondary | ICD-10-CM | POA: Diagnosis not present

## 2021-08-18 DIAGNOSIS — D649 Anemia, unspecified: Secondary | ICD-10-CM | POA: Diagnosis not present

## 2021-08-18 DIAGNOSIS — I428 Other cardiomyopathies: Secondary | ICD-10-CM | POA: Diagnosis not present

## 2021-08-18 DIAGNOSIS — J439 Emphysema, unspecified: Secondary | ICD-10-CM | POA: Diagnosis not present

## 2021-08-18 DIAGNOSIS — I83899 Varicose veins of unspecified lower extremities with other complications: Secondary | ICD-10-CM | POA: Diagnosis not present

## 2021-08-18 DIAGNOSIS — K5730 Diverticulosis of large intestine without perforation or abscess without bleeding: Secondary | ICD-10-CM | POA: Diagnosis not present

## 2021-08-18 DIAGNOSIS — Z9981 Dependence on supplemental oxygen: Secondary | ICD-10-CM | POA: Diagnosis not present

## 2021-08-19 DIAGNOSIS — Z7951 Long term (current) use of inhaled steroids: Secondary | ICD-10-CM | POA: Diagnosis not present

## 2021-08-19 DIAGNOSIS — J439 Emphysema, unspecified: Secondary | ICD-10-CM | POA: Diagnosis not present

## 2021-08-19 DIAGNOSIS — Z9981 Dependence on supplemental oxygen: Secondary | ICD-10-CM | POA: Diagnosis not present

## 2021-08-19 DIAGNOSIS — I119 Hypertensive heart disease without heart failure: Secondary | ICD-10-CM | POA: Diagnosis not present

## 2021-08-19 DIAGNOSIS — J9621 Acute and chronic respiratory failure with hypoxia: Secondary | ICD-10-CM | POA: Diagnosis not present

## 2021-08-19 DIAGNOSIS — I878 Other specified disorders of veins: Secondary | ICD-10-CM | POA: Diagnosis not present

## 2021-08-19 DIAGNOSIS — I951 Orthostatic hypotension: Secondary | ICD-10-CM | POA: Diagnosis not present

## 2021-08-19 DIAGNOSIS — Z87442 Personal history of urinary calculi: Secondary | ICD-10-CM | POA: Diagnosis not present

## 2021-08-19 DIAGNOSIS — Z85038 Personal history of other malignant neoplasm of large intestine: Secondary | ICD-10-CM | POA: Diagnosis not present

## 2021-08-19 DIAGNOSIS — Z87891 Personal history of nicotine dependence: Secondary | ICD-10-CM | POA: Diagnosis not present

## 2021-08-19 DIAGNOSIS — J45909 Unspecified asthma, uncomplicated: Secondary | ICD-10-CM | POA: Diagnosis not present

## 2021-08-19 DIAGNOSIS — Z7982 Long term (current) use of aspirin: Secondary | ICD-10-CM | POA: Diagnosis not present

## 2021-08-19 DIAGNOSIS — E785 Hyperlipidemia, unspecified: Secondary | ICD-10-CM | POA: Diagnosis not present

## 2021-08-19 DIAGNOSIS — I83899 Varicose veins of unspecified lower extremities with other complications: Secondary | ICD-10-CM | POA: Diagnosis not present

## 2021-08-19 DIAGNOSIS — G9340 Encephalopathy, unspecified: Secondary | ICD-10-CM | POA: Diagnosis not present

## 2021-08-19 DIAGNOSIS — D649 Anemia, unspecified: Secondary | ICD-10-CM | POA: Diagnosis not present

## 2021-08-19 DIAGNOSIS — J9622 Acute and chronic respiratory failure with hypercapnia: Secondary | ICD-10-CM | POA: Diagnosis not present

## 2021-08-19 DIAGNOSIS — E119 Type 2 diabetes mellitus without complications: Secondary | ICD-10-CM | POA: Diagnosis not present

## 2021-08-19 DIAGNOSIS — J310 Chronic rhinitis: Secondary | ICD-10-CM | POA: Diagnosis not present

## 2021-08-19 DIAGNOSIS — I428 Other cardiomyopathies: Secondary | ICD-10-CM | POA: Diagnosis not present

## 2021-08-19 DIAGNOSIS — K5730 Diverticulosis of large intestine without perforation or abscess without bleeding: Secondary | ICD-10-CM | POA: Diagnosis not present

## 2021-08-20 DIAGNOSIS — J439 Emphysema, unspecified: Secondary | ICD-10-CM | POA: Diagnosis not present

## 2021-08-21 DIAGNOSIS — I878 Other specified disorders of veins: Secondary | ICD-10-CM | POA: Diagnosis not present

## 2021-08-21 DIAGNOSIS — I119 Hypertensive heart disease without heart failure: Secondary | ICD-10-CM | POA: Diagnosis not present

## 2021-08-21 DIAGNOSIS — Z87891 Personal history of nicotine dependence: Secondary | ICD-10-CM | POA: Diagnosis not present

## 2021-08-21 DIAGNOSIS — E119 Type 2 diabetes mellitus without complications: Secondary | ICD-10-CM | POA: Diagnosis not present

## 2021-08-21 DIAGNOSIS — Z87442 Personal history of urinary calculi: Secondary | ICD-10-CM | POA: Diagnosis not present

## 2021-08-21 DIAGNOSIS — J45909 Unspecified asthma, uncomplicated: Secondary | ICD-10-CM | POA: Diagnosis not present

## 2021-08-21 DIAGNOSIS — J439 Emphysema, unspecified: Secondary | ICD-10-CM | POA: Diagnosis not present

## 2021-08-21 DIAGNOSIS — J9621 Acute and chronic respiratory failure with hypoxia: Secondary | ICD-10-CM | POA: Diagnosis not present

## 2021-08-21 DIAGNOSIS — K5730 Diverticulosis of large intestine without perforation or abscess without bleeding: Secondary | ICD-10-CM | POA: Diagnosis not present

## 2021-08-21 DIAGNOSIS — G9340 Encephalopathy, unspecified: Secondary | ICD-10-CM | POA: Diagnosis not present

## 2021-08-21 DIAGNOSIS — I428 Other cardiomyopathies: Secondary | ICD-10-CM | POA: Diagnosis not present

## 2021-08-21 DIAGNOSIS — D649 Anemia, unspecified: Secondary | ICD-10-CM | POA: Diagnosis not present

## 2021-08-21 DIAGNOSIS — Z9981 Dependence on supplemental oxygen: Secondary | ICD-10-CM | POA: Diagnosis not present

## 2021-08-21 DIAGNOSIS — I951 Orthostatic hypotension: Secondary | ICD-10-CM | POA: Diagnosis not present

## 2021-08-21 DIAGNOSIS — Z85038 Personal history of other malignant neoplasm of large intestine: Secondary | ICD-10-CM | POA: Diagnosis not present

## 2021-08-21 DIAGNOSIS — J310 Chronic rhinitis: Secondary | ICD-10-CM | POA: Diagnosis not present

## 2021-08-21 DIAGNOSIS — J9622 Acute and chronic respiratory failure with hypercapnia: Secondary | ICD-10-CM | POA: Diagnosis not present

## 2021-08-21 DIAGNOSIS — E785 Hyperlipidemia, unspecified: Secondary | ICD-10-CM | POA: Diagnosis not present

## 2021-08-21 DIAGNOSIS — Z7951 Long term (current) use of inhaled steroids: Secondary | ICD-10-CM | POA: Diagnosis not present

## 2021-08-21 DIAGNOSIS — I83899 Varicose veins of unspecified lower extremities with other complications: Secondary | ICD-10-CM | POA: Diagnosis not present

## 2021-08-21 DIAGNOSIS — Z7982 Long term (current) use of aspirin: Secondary | ICD-10-CM | POA: Diagnosis not present

## 2021-08-22 DIAGNOSIS — Z87442 Personal history of urinary calculi: Secondary | ICD-10-CM | POA: Diagnosis not present

## 2021-08-22 DIAGNOSIS — J310 Chronic rhinitis: Secondary | ICD-10-CM | POA: Diagnosis not present

## 2021-08-22 DIAGNOSIS — I951 Orthostatic hypotension: Secondary | ICD-10-CM | POA: Diagnosis not present

## 2021-08-22 DIAGNOSIS — J439 Emphysema, unspecified: Secondary | ICD-10-CM | POA: Diagnosis not present

## 2021-08-22 DIAGNOSIS — Z9981 Dependence on supplemental oxygen: Secondary | ICD-10-CM | POA: Diagnosis not present

## 2021-08-22 DIAGNOSIS — J45909 Unspecified asthma, uncomplicated: Secondary | ICD-10-CM | POA: Diagnosis not present

## 2021-08-22 DIAGNOSIS — Z7951 Long term (current) use of inhaled steroids: Secondary | ICD-10-CM | POA: Diagnosis not present

## 2021-08-22 DIAGNOSIS — I83899 Varicose veins of unspecified lower extremities with other complications: Secondary | ICD-10-CM | POA: Diagnosis not present

## 2021-08-22 DIAGNOSIS — Z85038 Personal history of other malignant neoplasm of large intestine: Secondary | ICD-10-CM | POA: Diagnosis not present

## 2021-08-22 DIAGNOSIS — I878 Other specified disorders of veins: Secondary | ICD-10-CM | POA: Diagnosis not present

## 2021-08-22 DIAGNOSIS — I428 Other cardiomyopathies: Secondary | ICD-10-CM | POA: Diagnosis not present

## 2021-08-22 DIAGNOSIS — Z87891 Personal history of nicotine dependence: Secondary | ICD-10-CM | POA: Diagnosis not present

## 2021-08-22 DIAGNOSIS — Z7982 Long term (current) use of aspirin: Secondary | ICD-10-CM | POA: Diagnosis not present

## 2021-08-22 DIAGNOSIS — G9340 Encephalopathy, unspecified: Secondary | ICD-10-CM | POA: Diagnosis not present

## 2021-08-22 DIAGNOSIS — J9621 Acute and chronic respiratory failure with hypoxia: Secondary | ICD-10-CM | POA: Diagnosis not present

## 2021-08-22 DIAGNOSIS — I119 Hypertensive heart disease without heart failure: Secondary | ICD-10-CM | POA: Diagnosis not present

## 2021-08-22 DIAGNOSIS — K5730 Diverticulosis of large intestine without perforation or abscess without bleeding: Secondary | ICD-10-CM | POA: Diagnosis not present

## 2021-08-22 DIAGNOSIS — E785 Hyperlipidemia, unspecified: Secondary | ICD-10-CM | POA: Diagnosis not present

## 2021-08-22 DIAGNOSIS — D649 Anemia, unspecified: Secondary | ICD-10-CM | POA: Diagnosis not present

## 2021-08-22 DIAGNOSIS — J9622 Acute and chronic respiratory failure with hypercapnia: Secondary | ICD-10-CM | POA: Diagnosis not present

## 2021-08-22 DIAGNOSIS — E119 Type 2 diabetes mellitus without complications: Secondary | ICD-10-CM | POA: Diagnosis not present

## 2021-08-26 DIAGNOSIS — Z85038 Personal history of other malignant neoplasm of large intestine: Secondary | ICD-10-CM | POA: Diagnosis not present

## 2021-08-26 DIAGNOSIS — I951 Orthostatic hypotension: Secondary | ICD-10-CM | POA: Diagnosis not present

## 2021-08-26 DIAGNOSIS — J45909 Unspecified asthma, uncomplicated: Secondary | ICD-10-CM | POA: Diagnosis not present

## 2021-08-26 DIAGNOSIS — Z87891 Personal history of nicotine dependence: Secondary | ICD-10-CM | POA: Diagnosis not present

## 2021-08-26 DIAGNOSIS — G9340 Encephalopathy, unspecified: Secondary | ICD-10-CM | POA: Diagnosis not present

## 2021-08-26 DIAGNOSIS — E785 Hyperlipidemia, unspecified: Secondary | ICD-10-CM | POA: Diagnosis not present

## 2021-08-26 DIAGNOSIS — K5730 Diverticulosis of large intestine without perforation or abscess without bleeding: Secondary | ICD-10-CM | POA: Diagnosis not present

## 2021-08-26 DIAGNOSIS — I119 Hypertensive heart disease without heart failure: Secondary | ICD-10-CM | POA: Diagnosis not present

## 2021-08-26 DIAGNOSIS — E119 Type 2 diabetes mellitus without complications: Secondary | ICD-10-CM | POA: Diagnosis not present

## 2021-08-26 DIAGNOSIS — I878 Other specified disorders of veins: Secondary | ICD-10-CM | POA: Diagnosis not present

## 2021-08-26 DIAGNOSIS — I83899 Varicose veins of unspecified lower extremities with other complications: Secondary | ICD-10-CM | POA: Diagnosis not present

## 2021-08-26 DIAGNOSIS — Z87442 Personal history of urinary calculi: Secondary | ICD-10-CM | POA: Diagnosis not present

## 2021-08-26 DIAGNOSIS — Z9981 Dependence on supplemental oxygen: Secondary | ICD-10-CM | POA: Diagnosis not present

## 2021-08-26 DIAGNOSIS — J439 Emphysema, unspecified: Secondary | ICD-10-CM | POA: Diagnosis not present

## 2021-08-26 DIAGNOSIS — D649 Anemia, unspecified: Secondary | ICD-10-CM | POA: Diagnosis not present

## 2021-08-26 DIAGNOSIS — Z7982 Long term (current) use of aspirin: Secondary | ICD-10-CM | POA: Diagnosis not present

## 2021-08-26 DIAGNOSIS — J310 Chronic rhinitis: Secondary | ICD-10-CM | POA: Diagnosis not present

## 2021-08-26 DIAGNOSIS — I428 Other cardiomyopathies: Secondary | ICD-10-CM | POA: Diagnosis not present

## 2021-08-26 DIAGNOSIS — J9622 Acute and chronic respiratory failure with hypercapnia: Secondary | ICD-10-CM | POA: Diagnosis not present

## 2021-08-26 DIAGNOSIS — J9621 Acute and chronic respiratory failure with hypoxia: Secondary | ICD-10-CM | POA: Diagnosis not present

## 2021-08-26 DIAGNOSIS — Z7951 Long term (current) use of inhaled steroids: Secondary | ICD-10-CM | POA: Diagnosis not present

## 2021-08-27 DIAGNOSIS — Z87442 Personal history of urinary calculi: Secondary | ICD-10-CM | POA: Diagnosis not present

## 2021-08-27 DIAGNOSIS — E119 Type 2 diabetes mellitus without complications: Secondary | ICD-10-CM | POA: Diagnosis not present

## 2021-08-27 DIAGNOSIS — J45909 Unspecified asthma, uncomplicated: Secondary | ICD-10-CM | POA: Diagnosis not present

## 2021-08-27 DIAGNOSIS — I119 Hypertensive heart disease without heart failure: Secondary | ICD-10-CM | POA: Diagnosis not present

## 2021-08-27 DIAGNOSIS — Z9981 Dependence on supplemental oxygen: Secondary | ICD-10-CM | POA: Diagnosis not present

## 2021-08-27 DIAGNOSIS — G9340 Encephalopathy, unspecified: Secondary | ICD-10-CM | POA: Diagnosis not present

## 2021-08-27 DIAGNOSIS — J9621 Acute and chronic respiratory failure with hypoxia: Secondary | ICD-10-CM | POA: Diagnosis not present

## 2021-08-27 DIAGNOSIS — Z87891 Personal history of nicotine dependence: Secondary | ICD-10-CM | POA: Diagnosis not present

## 2021-08-27 DIAGNOSIS — E785 Hyperlipidemia, unspecified: Secondary | ICD-10-CM | POA: Diagnosis not present

## 2021-08-27 DIAGNOSIS — D649 Anemia, unspecified: Secondary | ICD-10-CM | POA: Diagnosis not present

## 2021-08-27 DIAGNOSIS — I878 Other specified disorders of veins: Secondary | ICD-10-CM | POA: Diagnosis not present

## 2021-08-27 DIAGNOSIS — J439 Emphysema, unspecified: Secondary | ICD-10-CM | POA: Diagnosis not present

## 2021-08-27 DIAGNOSIS — K5730 Diverticulosis of large intestine without perforation or abscess without bleeding: Secondary | ICD-10-CM | POA: Diagnosis not present

## 2021-08-27 DIAGNOSIS — Z7982 Long term (current) use of aspirin: Secondary | ICD-10-CM | POA: Diagnosis not present

## 2021-08-27 DIAGNOSIS — J9622 Acute and chronic respiratory failure with hypercapnia: Secondary | ICD-10-CM | POA: Diagnosis not present

## 2021-08-27 DIAGNOSIS — I428 Other cardiomyopathies: Secondary | ICD-10-CM | POA: Diagnosis not present

## 2021-08-27 DIAGNOSIS — Z7951 Long term (current) use of inhaled steroids: Secondary | ICD-10-CM | POA: Diagnosis not present

## 2021-08-27 DIAGNOSIS — J310 Chronic rhinitis: Secondary | ICD-10-CM | POA: Diagnosis not present

## 2021-08-27 DIAGNOSIS — Z85038 Personal history of other malignant neoplasm of large intestine: Secondary | ICD-10-CM | POA: Diagnosis not present

## 2021-08-27 DIAGNOSIS — I83899 Varicose veins of unspecified lower extremities with other complications: Secondary | ICD-10-CM | POA: Diagnosis not present

## 2021-08-27 DIAGNOSIS — I951 Orthostatic hypotension: Secondary | ICD-10-CM | POA: Diagnosis not present

## 2021-08-28 DIAGNOSIS — J439 Emphysema, unspecified: Secondary | ICD-10-CM | POA: Diagnosis not present

## 2021-08-28 DIAGNOSIS — Z7951 Long term (current) use of inhaled steroids: Secondary | ICD-10-CM | POA: Diagnosis not present

## 2021-08-28 DIAGNOSIS — I428 Other cardiomyopathies: Secondary | ICD-10-CM | POA: Diagnosis not present

## 2021-08-28 DIAGNOSIS — K5730 Diverticulosis of large intestine without perforation or abscess without bleeding: Secondary | ICD-10-CM | POA: Diagnosis not present

## 2021-08-28 DIAGNOSIS — I83899 Varicose veins of unspecified lower extremities with other complications: Secondary | ICD-10-CM | POA: Diagnosis not present

## 2021-08-28 DIAGNOSIS — D649 Anemia, unspecified: Secondary | ICD-10-CM | POA: Diagnosis not present

## 2021-08-28 DIAGNOSIS — I878 Other specified disorders of veins: Secondary | ICD-10-CM | POA: Diagnosis not present

## 2021-08-28 DIAGNOSIS — G9340 Encephalopathy, unspecified: Secondary | ICD-10-CM | POA: Diagnosis not present

## 2021-08-28 DIAGNOSIS — Z9981 Dependence on supplemental oxygen: Secondary | ICD-10-CM | POA: Diagnosis not present

## 2021-08-28 DIAGNOSIS — I119 Hypertensive heart disease without heart failure: Secondary | ICD-10-CM | POA: Diagnosis not present

## 2021-08-28 DIAGNOSIS — J9621 Acute and chronic respiratory failure with hypoxia: Secondary | ICD-10-CM | POA: Diagnosis not present

## 2021-08-28 DIAGNOSIS — J45909 Unspecified asthma, uncomplicated: Secondary | ICD-10-CM | POA: Diagnosis not present

## 2021-08-28 DIAGNOSIS — Z87442 Personal history of urinary calculi: Secondary | ICD-10-CM | POA: Diagnosis not present

## 2021-08-28 DIAGNOSIS — Z7982 Long term (current) use of aspirin: Secondary | ICD-10-CM | POA: Diagnosis not present

## 2021-08-28 DIAGNOSIS — Z85038 Personal history of other malignant neoplasm of large intestine: Secondary | ICD-10-CM | POA: Diagnosis not present

## 2021-08-28 DIAGNOSIS — I951 Orthostatic hypotension: Secondary | ICD-10-CM | POA: Diagnosis not present

## 2021-08-28 DIAGNOSIS — E119 Type 2 diabetes mellitus without complications: Secondary | ICD-10-CM | POA: Diagnosis not present

## 2021-08-28 DIAGNOSIS — Z87891 Personal history of nicotine dependence: Secondary | ICD-10-CM | POA: Diagnosis not present

## 2021-08-28 DIAGNOSIS — J310 Chronic rhinitis: Secondary | ICD-10-CM | POA: Diagnosis not present

## 2021-08-28 DIAGNOSIS — E785 Hyperlipidemia, unspecified: Secondary | ICD-10-CM | POA: Diagnosis not present

## 2021-08-28 DIAGNOSIS — J9622 Acute and chronic respiratory failure with hypercapnia: Secondary | ICD-10-CM | POA: Diagnosis not present

## 2021-08-29 DIAGNOSIS — I700 Atherosclerosis of aorta: Secondary | ICD-10-CM | POA: Diagnosis not present

## 2021-08-29 DIAGNOSIS — Z87442 Personal history of urinary calculi: Secondary | ICD-10-CM | POA: Diagnosis not present

## 2021-08-29 DIAGNOSIS — I878 Other specified disorders of veins: Secondary | ICD-10-CM | POA: Diagnosis not present

## 2021-08-29 DIAGNOSIS — E785 Hyperlipidemia, unspecified: Secondary | ICD-10-CM | POA: Diagnosis not present

## 2021-08-29 DIAGNOSIS — Z7982 Long term (current) use of aspirin: Secondary | ICD-10-CM | POA: Diagnosis not present

## 2021-08-29 DIAGNOSIS — E119 Type 2 diabetes mellitus without complications: Secondary | ICD-10-CM | POA: Diagnosis not present

## 2021-08-29 DIAGNOSIS — J9622 Acute and chronic respiratory failure with hypercapnia: Secondary | ICD-10-CM | POA: Diagnosis not present

## 2021-08-29 DIAGNOSIS — Z7951 Long term (current) use of inhaled steroids: Secondary | ICD-10-CM | POA: Diagnosis not present

## 2021-08-29 DIAGNOSIS — Z85038 Personal history of other malignant neoplasm of large intestine: Secondary | ICD-10-CM | POA: Diagnosis not present

## 2021-08-29 DIAGNOSIS — J449 Chronic obstructive pulmonary disease, unspecified: Secondary | ICD-10-CM | POA: Diagnosis not present

## 2021-08-29 DIAGNOSIS — Z87891 Personal history of nicotine dependence: Secondary | ICD-10-CM | POA: Diagnosis not present

## 2021-08-29 DIAGNOSIS — D638 Anemia in other chronic diseases classified elsewhere: Secondary | ICD-10-CM | POA: Diagnosis not present

## 2021-08-29 DIAGNOSIS — I119 Hypertensive heart disease without heart failure: Secondary | ICD-10-CM | POA: Diagnosis not present

## 2021-08-29 DIAGNOSIS — K5730 Diverticulosis of large intestine without perforation or abscess without bleeding: Secondary | ICD-10-CM | POA: Diagnosis not present

## 2021-08-29 DIAGNOSIS — D649 Anemia, unspecified: Secondary | ICD-10-CM | POA: Diagnosis not present

## 2021-08-29 DIAGNOSIS — I5022 Chronic systolic (congestive) heart failure: Secondary | ICD-10-CM | POA: Diagnosis not present

## 2021-08-29 DIAGNOSIS — J310 Chronic rhinitis: Secondary | ICD-10-CM | POA: Diagnosis not present

## 2021-08-29 DIAGNOSIS — J9621 Acute and chronic respiratory failure with hypoxia: Secondary | ICD-10-CM | POA: Diagnosis not present

## 2021-08-29 DIAGNOSIS — G9340 Encephalopathy, unspecified: Secondary | ICD-10-CM | POA: Diagnosis not present

## 2021-08-29 DIAGNOSIS — I951 Orthostatic hypotension: Secondary | ICD-10-CM | POA: Diagnosis not present

## 2021-08-29 DIAGNOSIS — J439 Emphysema, unspecified: Secondary | ICD-10-CM | POA: Diagnosis not present

## 2021-08-29 DIAGNOSIS — Z9981 Dependence on supplemental oxygen: Secondary | ICD-10-CM | POA: Diagnosis not present

## 2021-08-29 DIAGNOSIS — E1159 Type 2 diabetes mellitus with other circulatory complications: Secondary | ICD-10-CM | POA: Diagnosis not present

## 2021-08-29 DIAGNOSIS — I428 Other cardiomyopathies: Secondary | ICD-10-CM | POA: Diagnosis not present

## 2021-08-29 DIAGNOSIS — I83899 Varicose veins of unspecified lower extremities with other complications: Secondary | ICD-10-CM | POA: Diagnosis not present

## 2021-08-29 DIAGNOSIS — J45909 Unspecified asthma, uncomplicated: Secondary | ICD-10-CM | POA: Diagnosis not present

## 2021-09-02 ENCOUNTER — Other Ambulatory Visit: Payer: Self-pay | Admitting: *Deleted

## 2021-09-02 NOTE — Patient Instructions (Signed)
Visit Information  Thank you for taking time to visit with me today. Please don't hesitate to contact me if I can be of assistance to you before our next scheduled telephone appointment.  Following are the goals we discussed today:    Take all medications as prescribed Attend all scheduled provider appointments Call pharmacy for medication refills 3-7 days in advance of running out of medications Perform all self care activities independently  Perform IADL's (shopping, preparing meals, housekeeping, managing finances) independently Call provider office for new concerns or questions

## 2021-09-03 DIAGNOSIS — E785 Hyperlipidemia, unspecified: Secondary | ICD-10-CM | POA: Diagnosis not present

## 2021-09-03 DIAGNOSIS — Z7982 Long term (current) use of aspirin: Secondary | ICD-10-CM | POA: Diagnosis not present

## 2021-09-03 DIAGNOSIS — J9621 Acute and chronic respiratory failure with hypoxia: Secondary | ICD-10-CM | POA: Diagnosis not present

## 2021-09-03 DIAGNOSIS — I878 Other specified disorders of veins: Secondary | ICD-10-CM | POA: Diagnosis not present

## 2021-09-03 DIAGNOSIS — I119 Hypertensive heart disease without heart failure: Secondary | ICD-10-CM | POA: Diagnosis not present

## 2021-09-03 DIAGNOSIS — Z9981 Dependence on supplemental oxygen: Secondary | ICD-10-CM | POA: Diagnosis not present

## 2021-09-03 DIAGNOSIS — I951 Orthostatic hypotension: Secondary | ICD-10-CM | POA: Diagnosis not present

## 2021-09-03 DIAGNOSIS — K5730 Diverticulosis of large intestine without perforation or abscess without bleeding: Secondary | ICD-10-CM | POA: Diagnosis not present

## 2021-09-03 DIAGNOSIS — G9340 Encephalopathy, unspecified: Secondary | ICD-10-CM | POA: Diagnosis not present

## 2021-09-03 DIAGNOSIS — J439 Emphysema, unspecified: Secondary | ICD-10-CM | POA: Diagnosis not present

## 2021-09-03 DIAGNOSIS — E119 Type 2 diabetes mellitus without complications: Secondary | ICD-10-CM | POA: Diagnosis not present

## 2021-09-03 DIAGNOSIS — I428 Other cardiomyopathies: Secondary | ICD-10-CM | POA: Diagnosis not present

## 2021-09-03 DIAGNOSIS — Z87891 Personal history of nicotine dependence: Secondary | ICD-10-CM | POA: Diagnosis not present

## 2021-09-03 DIAGNOSIS — Z7951 Long term (current) use of inhaled steroids: Secondary | ICD-10-CM | POA: Diagnosis not present

## 2021-09-03 DIAGNOSIS — I83899 Varicose veins of unspecified lower extremities with other complications: Secondary | ICD-10-CM | POA: Diagnosis not present

## 2021-09-03 DIAGNOSIS — Z87442 Personal history of urinary calculi: Secondary | ICD-10-CM | POA: Diagnosis not present

## 2021-09-03 DIAGNOSIS — J310 Chronic rhinitis: Secondary | ICD-10-CM | POA: Diagnosis not present

## 2021-09-03 DIAGNOSIS — D649 Anemia, unspecified: Secondary | ICD-10-CM | POA: Diagnosis not present

## 2021-09-03 DIAGNOSIS — Z85038 Personal history of other malignant neoplasm of large intestine: Secondary | ICD-10-CM | POA: Diagnosis not present

## 2021-09-03 DIAGNOSIS — J45909 Unspecified asthma, uncomplicated: Secondary | ICD-10-CM | POA: Diagnosis not present

## 2021-09-03 DIAGNOSIS — J9622 Acute and chronic respiratory failure with hypercapnia: Secondary | ICD-10-CM | POA: Diagnosis not present

## 2021-09-05 DIAGNOSIS — D649 Anemia, unspecified: Secondary | ICD-10-CM | POA: Diagnosis not present

## 2021-09-05 DIAGNOSIS — K5730 Diverticulosis of large intestine without perforation or abscess without bleeding: Secondary | ICD-10-CM | POA: Diagnosis not present

## 2021-09-05 DIAGNOSIS — Z87891 Personal history of nicotine dependence: Secondary | ICD-10-CM | POA: Diagnosis not present

## 2021-09-05 DIAGNOSIS — Z87442 Personal history of urinary calculi: Secondary | ICD-10-CM | POA: Diagnosis not present

## 2021-09-05 DIAGNOSIS — J45909 Unspecified asthma, uncomplicated: Secondary | ICD-10-CM | POA: Diagnosis not present

## 2021-09-05 DIAGNOSIS — I878 Other specified disorders of veins: Secondary | ICD-10-CM | POA: Diagnosis not present

## 2021-09-05 DIAGNOSIS — Z85038 Personal history of other malignant neoplasm of large intestine: Secondary | ICD-10-CM | POA: Diagnosis not present

## 2021-09-05 DIAGNOSIS — J9601 Acute respiratory failure with hypoxia: Secondary | ICD-10-CM | POA: Diagnosis not present

## 2021-09-05 DIAGNOSIS — J310 Chronic rhinitis: Secondary | ICD-10-CM | POA: Diagnosis not present

## 2021-09-05 DIAGNOSIS — Z9981 Dependence on supplemental oxygen: Secondary | ICD-10-CM | POA: Diagnosis not present

## 2021-09-05 DIAGNOSIS — Z7982 Long term (current) use of aspirin: Secondary | ICD-10-CM | POA: Diagnosis not present

## 2021-09-05 DIAGNOSIS — J9621 Acute and chronic respiratory failure with hypoxia: Secondary | ICD-10-CM | POA: Diagnosis not present

## 2021-09-05 DIAGNOSIS — I83899 Varicose veins of unspecified lower extremities with other complications: Secondary | ICD-10-CM | POA: Diagnosis not present

## 2021-09-05 DIAGNOSIS — E785 Hyperlipidemia, unspecified: Secondary | ICD-10-CM | POA: Diagnosis not present

## 2021-09-05 DIAGNOSIS — J9622 Acute and chronic respiratory failure with hypercapnia: Secondary | ICD-10-CM | POA: Diagnosis not present

## 2021-09-05 DIAGNOSIS — J449 Chronic obstructive pulmonary disease, unspecified: Secondary | ICD-10-CM | POA: Diagnosis not present

## 2021-09-05 DIAGNOSIS — Z7951 Long term (current) use of inhaled steroids: Secondary | ICD-10-CM | POA: Diagnosis not present

## 2021-09-05 DIAGNOSIS — I428 Other cardiomyopathies: Secondary | ICD-10-CM | POA: Diagnosis not present

## 2021-09-05 DIAGNOSIS — I119 Hypertensive heart disease without heart failure: Secondary | ICD-10-CM | POA: Diagnosis not present

## 2021-09-05 DIAGNOSIS — G9340 Encephalopathy, unspecified: Secondary | ICD-10-CM | POA: Diagnosis not present

## 2021-09-05 DIAGNOSIS — I951 Orthostatic hypotension: Secondary | ICD-10-CM | POA: Diagnosis not present

## 2021-09-05 DIAGNOSIS — J439 Emphysema, unspecified: Secondary | ICD-10-CM | POA: Diagnosis not present

## 2021-09-05 DIAGNOSIS — E119 Type 2 diabetes mellitus without complications: Secondary | ICD-10-CM | POA: Diagnosis not present

## 2021-09-10 DIAGNOSIS — J310 Chronic rhinitis: Secondary | ICD-10-CM | POA: Diagnosis not present

## 2021-09-10 DIAGNOSIS — Z7982 Long term (current) use of aspirin: Secondary | ICD-10-CM | POA: Diagnosis not present

## 2021-09-10 DIAGNOSIS — Z9981 Dependence on supplemental oxygen: Secondary | ICD-10-CM | POA: Diagnosis not present

## 2021-09-10 DIAGNOSIS — Z87891 Personal history of nicotine dependence: Secondary | ICD-10-CM | POA: Diagnosis not present

## 2021-09-10 DIAGNOSIS — I119 Hypertensive heart disease without heart failure: Secondary | ICD-10-CM | POA: Diagnosis not present

## 2021-09-10 DIAGNOSIS — J9622 Acute and chronic respiratory failure with hypercapnia: Secondary | ICD-10-CM | POA: Diagnosis not present

## 2021-09-10 DIAGNOSIS — J9621 Acute and chronic respiratory failure with hypoxia: Secondary | ICD-10-CM | POA: Diagnosis not present

## 2021-09-10 DIAGNOSIS — E119 Type 2 diabetes mellitus without complications: Secondary | ICD-10-CM | POA: Diagnosis not present

## 2021-09-10 DIAGNOSIS — I878 Other specified disorders of veins: Secondary | ICD-10-CM | POA: Diagnosis not present

## 2021-09-10 DIAGNOSIS — I428 Other cardiomyopathies: Secondary | ICD-10-CM | POA: Diagnosis not present

## 2021-09-10 DIAGNOSIS — K5730 Diverticulosis of large intestine without perforation or abscess without bleeding: Secondary | ICD-10-CM | POA: Diagnosis not present

## 2021-09-10 DIAGNOSIS — I951 Orthostatic hypotension: Secondary | ICD-10-CM | POA: Diagnosis not present

## 2021-09-10 DIAGNOSIS — I83899 Varicose veins of unspecified lower extremities with other complications: Secondary | ICD-10-CM | POA: Diagnosis not present

## 2021-09-10 DIAGNOSIS — Z85038 Personal history of other malignant neoplasm of large intestine: Secondary | ICD-10-CM | POA: Diagnosis not present

## 2021-09-10 DIAGNOSIS — D649 Anemia, unspecified: Secondary | ICD-10-CM | POA: Diagnosis not present

## 2021-09-10 DIAGNOSIS — E785 Hyperlipidemia, unspecified: Secondary | ICD-10-CM | POA: Diagnosis not present

## 2021-09-10 DIAGNOSIS — Z7951 Long term (current) use of inhaled steroids: Secondary | ICD-10-CM | POA: Diagnosis not present

## 2021-09-10 DIAGNOSIS — J45909 Unspecified asthma, uncomplicated: Secondary | ICD-10-CM | POA: Diagnosis not present

## 2021-09-10 DIAGNOSIS — Z87442 Personal history of urinary calculi: Secondary | ICD-10-CM | POA: Diagnosis not present

## 2021-09-10 DIAGNOSIS — J439 Emphysema, unspecified: Secondary | ICD-10-CM | POA: Diagnosis not present

## 2021-09-10 DIAGNOSIS — G9340 Encephalopathy, unspecified: Secondary | ICD-10-CM | POA: Diagnosis not present

## 2021-09-12 DIAGNOSIS — E119 Type 2 diabetes mellitus without complications: Secondary | ICD-10-CM | POA: Diagnosis not present

## 2021-09-12 DIAGNOSIS — Z85038 Personal history of other malignant neoplasm of large intestine: Secondary | ICD-10-CM | POA: Diagnosis not present

## 2021-09-12 DIAGNOSIS — J9622 Acute and chronic respiratory failure with hypercapnia: Secondary | ICD-10-CM | POA: Diagnosis not present

## 2021-09-12 DIAGNOSIS — D649 Anemia, unspecified: Secondary | ICD-10-CM | POA: Diagnosis not present

## 2021-09-12 DIAGNOSIS — Z87891 Personal history of nicotine dependence: Secondary | ICD-10-CM | POA: Diagnosis not present

## 2021-09-12 DIAGNOSIS — J310 Chronic rhinitis: Secondary | ICD-10-CM | POA: Diagnosis not present

## 2021-09-12 DIAGNOSIS — I428 Other cardiomyopathies: Secondary | ICD-10-CM | POA: Diagnosis not present

## 2021-09-12 DIAGNOSIS — E785 Hyperlipidemia, unspecified: Secondary | ICD-10-CM | POA: Diagnosis not present

## 2021-09-12 DIAGNOSIS — Z87442 Personal history of urinary calculi: Secondary | ICD-10-CM | POA: Diagnosis not present

## 2021-09-12 DIAGNOSIS — J439 Emphysema, unspecified: Secondary | ICD-10-CM | POA: Diagnosis not present

## 2021-09-12 DIAGNOSIS — I878 Other specified disorders of veins: Secondary | ICD-10-CM | POA: Diagnosis not present

## 2021-09-12 DIAGNOSIS — G9340 Encephalopathy, unspecified: Secondary | ICD-10-CM | POA: Diagnosis not present

## 2021-09-12 DIAGNOSIS — I83899 Varicose veins of unspecified lower extremities with other complications: Secondary | ICD-10-CM | POA: Diagnosis not present

## 2021-09-12 DIAGNOSIS — K5730 Diverticulosis of large intestine without perforation or abscess without bleeding: Secondary | ICD-10-CM | POA: Diagnosis not present

## 2021-09-12 DIAGNOSIS — J45909 Unspecified asthma, uncomplicated: Secondary | ICD-10-CM | POA: Diagnosis not present

## 2021-09-12 DIAGNOSIS — J9621 Acute and chronic respiratory failure with hypoxia: Secondary | ICD-10-CM | POA: Diagnosis not present

## 2021-09-12 DIAGNOSIS — I951 Orthostatic hypotension: Secondary | ICD-10-CM | POA: Diagnosis not present

## 2021-09-12 DIAGNOSIS — Z9981 Dependence on supplemental oxygen: Secondary | ICD-10-CM | POA: Diagnosis not present

## 2021-09-12 DIAGNOSIS — Z7982 Long term (current) use of aspirin: Secondary | ICD-10-CM | POA: Diagnosis not present

## 2021-09-12 DIAGNOSIS — Z7951 Long term (current) use of inhaled steroids: Secondary | ICD-10-CM | POA: Diagnosis not present

## 2021-09-12 DIAGNOSIS — I119 Hypertensive heart disease without heart failure: Secondary | ICD-10-CM | POA: Diagnosis not present

## 2021-09-16 DIAGNOSIS — J45909 Unspecified asthma, uncomplicated: Secondary | ICD-10-CM | POA: Diagnosis not present

## 2021-09-16 DIAGNOSIS — J310 Chronic rhinitis: Secondary | ICD-10-CM | POA: Diagnosis not present

## 2021-09-16 DIAGNOSIS — D649 Anemia, unspecified: Secondary | ICD-10-CM | POA: Diagnosis not present

## 2021-09-16 DIAGNOSIS — I428 Other cardiomyopathies: Secondary | ICD-10-CM | POA: Diagnosis not present

## 2021-09-16 DIAGNOSIS — Z7982 Long term (current) use of aspirin: Secondary | ICD-10-CM | POA: Diagnosis not present

## 2021-09-16 DIAGNOSIS — E785 Hyperlipidemia, unspecified: Secondary | ICD-10-CM | POA: Diagnosis not present

## 2021-09-16 DIAGNOSIS — Z9981 Dependence on supplemental oxygen: Secondary | ICD-10-CM | POA: Diagnosis not present

## 2021-09-16 DIAGNOSIS — J9621 Acute and chronic respiratory failure with hypoxia: Secondary | ICD-10-CM | POA: Diagnosis not present

## 2021-09-16 DIAGNOSIS — Z85038 Personal history of other malignant neoplasm of large intestine: Secondary | ICD-10-CM | POA: Diagnosis not present

## 2021-09-16 DIAGNOSIS — J439 Emphysema, unspecified: Secondary | ICD-10-CM | POA: Diagnosis not present

## 2021-09-16 DIAGNOSIS — Z7951 Long term (current) use of inhaled steroids: Secondary | ICD-10-CM | POA: Diagnosis not present

## 2021-09-16 DIAGNOSIS — I951 Orthostatic hypotension: Secondary | ICD-10-CM | POA: Diagnosis not present

## 2021-09-16 DIAGNOSIS — I119 Hypertensive heart disease without heart failure: Secondary | ICD-10-CM | POA: Diagnosis not present

## 2021-09-16 DIAGNOSIS — Z87891 Personal history of nicotine dependence: Secondary | ICD-10-CM | POA: Diagnosis not present

## 2021-09-16 DIAGNOSIS — I878 Other specified disorders of veins: Secondary | ICD-10-CM | POA: Diagnosis not present

## 2021-09-16 DIAGNOSIS — G9340 Encephalopathy, unspecified: Secondary | ICD-10-CM | POA: Diagnosis not present

## 2021-09-16 DIAGNOSIS — I83899 Varicose veins of unspecified lower extremities with other complications: Secondary | ICD-10-CM | POA: Diagnosis not present

## 2021-09-16 DIAGNOSIS — Z87442 Personal history of urinary calculi: Secondary | ICD-10-CM | POA: Diagnosis not present

## 2021-09-16 DIAGNOSIS — E119 Type 2 diabetes mellitus without complications: Secondary | ICD-10-CM | POA: Diagnosis not present

## 2021-09-16 DIAGNOSIS — K5730 Diverticulosis of large intestine without perforation or abscess without bleeding: Secondary | ICD-10-CM | POA: Diagnosis not present

## 2021-09-16 DIAGNOSIS — J9622 Acute and chronic respiratory failure with hypercapnia: Secondary | ICD-10-CM | POA: Diagnosis not present

## 2021-09-17 DIAGNOSIS — D649 Anemia, unspecified: Secondary | ICD-10-CM | POA: Diagnosis not present

## 2021-09-17 DIAGNOSIS — Z7951 Long term (current) use of inhaled steroids: Secondary | ICD-10-CM | POA: Diagnosis not present

## 2021-09-17 DIAGNOSIS — Z87442 Personal history of urinary calculi: Secondary | ICD-10-CM | POA: Diagnosis not present

## 2021-09-17 DIAGNOSIS — G9340 Encephalopathy, unspecified: Secondary | ICD-10-CM | POA: Diagnosis not present

## 2021-09-17 DIAGNOSIS — Z87891 Personal history of nicotine dependence: Secondary | ICD-10-CM | POA: Diagnosis not present

## 2021-09-17 DIAGNOSIS — J439 Emphysema, unspecified: Secondary | ICD-10-CM | POA: Diagnosis not present

## 2021-09-17 DIAGNOSIS — J9622 Acute and chronic respiratory failure with hypercapnia: Secondary | ICD-10-CM | POA: Diagnosis not present

## 2021-09-17 DIAGNOSIS — Z9981 Dependence on supplemental oxygen: Secondary | ICD-10-CM | POA: Diagnosis not present

## 2021-09-17 DIAGNOSIS — J310 Chronic rhinitis: Secondary | ICD-10-CM | POA: Diagnosis not present

## 2021-09-17 DIAGNOSIS — I951 Orthostatic hypotension: Secondary | ICD-10-CM | POA: Diagnosis not present

## 2021-09-17 DIAGNOSIS — K5730 Diverticulosis of large intestine without perforation or abscess without bleeding: Secondary | ICD-10-CM | POA: Diagnosis not present

## 2021-09-17 DIAGNOSIS — Z7982 Long term (current) use of aspirin: Secondary | ICD-10-CM | POA: Diagnosis not present

## 2021-09-17 DIAGNOSIS — J45909 Unspecified asthma, uncomplicated: Secondary | ICD-10-CM | POA: Diagnosis not present

## 2021-09-17 DIAGNOSIS — I119 Hypertensive heart disease without heart failure: Secondary | ICD-10-CM | POA: Diagnosis not present

## 2021-09-17 DIAGNOSIS — Z85038 Personal history of other malignant neoplasm of large intestine: Secondary | ICD-10-CM | POA: Diagnosis not present

## 2021-09-17 DIAGNOSIS — E119 Type 2 diabetes mellitus without complications: Secondary | ICD-10-CM | POA: Diagnosis not present

## 2021-09-17 DIAGNOSIS — I878 Other specified disorders of veins: Secondary | ICD-10-CM | POA: Diagnosis not present

## 2021-09-17 DIAGNOSIS — E785 Hyperlipidemia, unspecified: Secondary | ICD-10-CM | POA: Diagnosis not present

## 2021-09-17 DIAGNOSIS — J9621 Acute and chronic respiratory failure with hypoxia: Secondary | ICD-10-CM | POA: Diagnosis not present

## 2021-09-17 DIAGNOSIS — I83899 Varicose veins of unspecified lower extremities with other complications: Secondary | ICD-10-CM | POA: Diagnosis not present

## 2021-09-17 DIAGNOSIS — I428 Other cardiomyopathies: Secondary | ICD-10-CM | POA: Diagnosis not present

## 2021-09-18 DIAGNOSIS — E785 Hyperlipidemia, unspecified: Secondary | ICD-10-CM | POA: Diagnosis not present

## 2021-09-18 DIAGNOSIS — E119 Type 2 diabetes mellitus without complications: Secondary | ICD-10-CM | POA: Diagnosis not present

## 2021-09-18 DIAGNOSIS — Z9981 Dependence on supplemental oxygen: Secondary | ICD-10-CM | POA: Diagnosis not present

## 2021-09-18 DIAGNOSIS — J45909 Unspecified asthma, uncomplicated: Secondary | ICD-10-CM | POA: Diagnosis not present

## 2021-09-18 DIAGNOSIS — I83899 Varicose veins of unspecified lower extremities with other complications: Secondary | ICD-10-CM | POA: Diagnosis not present

## 2021-09-18 DIAGNOSIS — I951 Orthostatic hypotension: Secondary | ICD-10-CM | POA: Diagnosis not present

## 2021-09-18 DIAGNOSIS — K5730 Diverticulosis of large intestine without perforation or abscess without bleeding: Secondary | ICD-10-CM | POA: Diagnosis not present

## 2021-09-18 DIAGNOSIS — D649 Anemia, unspecified: Secondary | ICD-10-CM | POA: Diagnosis not present

## 2021-09-18 DIAGNOSIS — I119 Hypertensive heart disease without heart failure: Secondary | ICD-10-CM | POA: Diagnosis not present

## 2021-09-18 DIAGNOSIS — J439 Emphysema, unspecified: Secondary | ICD-10-CM | POA: Diagnosis not present

## 2021-09-18 DIAGNOSIS — J9622 Acute and chronic respiratory failure with hypercapnia: Secondary | ICD-10-CM | POA: Diagnosis not present

## 2021-09-18 DIAGNOSIS — Z7982 Long term (current) use of aspirin: Secondary | ICD-10-CM | POA: Diagnosis not present

## 2021-09-18 DIAGNOSIS — Z85038 Personal history of other malignant neoplasm of large intestine: Secondary | ICD-10-CM | POA: Diagnosis not present

## 2021-09-18 DIAGNOSIS — I878 Other specified disorders of veins: Secondary | ICD-10-CM | POA: Diagnosis not present

## 2021-09-18 DIAGNOSIS — Z87442 Personal history of urinary calculi: Secondary | ICD-10-CM | POA: Diagnosis not present

## 2021-09-18 DIAGNOSIS — J310 Chronic rhinitis: Secondary | ICD-10-CM | POA: Diagnosis not present

## 2021-09-18 DIAGNOSIS — Z7951 Long term (current) use of inhaled steroids: Secondary | ICD-10-CM | POA: Diagnosis not present

## 2021-09-18 DIAGNOSIS — I428 Other cardiomyopathies: Secondary | ICD-10-CM | POA: Diagnosis not present

## 2021-09-18 DIAGNOSIS — J9621 Acute and chronic respiratory failure with hypoxia: Secondary | ICD-10-CM | POA: Diagnosis not present

## 2021-09-18 DIAGNOSIS — G9340 Encephalopathy, unspecified: Secondary | ICD-10-CM | POA: Diagnosis not present

## 2021-09-18 DIAGNOSIS — Z87891 Personal history of nicotine dependence: Secondary | ICD-10-CM | POA: Diagnosis not present

## 2021-09-23 DIAGNOSIS — Z7982 Long term (current) use of aspirin: Secondary | ICD-10-CM | POA: Diagnosis not present

## 2021-09-23 DIAGNOSIS — Z87891 Personal history of nicotine dependence: Secondary | ICD-10-CM | POA: Diagnosis not present

## 2021-09-23 DIAGNOSIS — Z85038 Personal history of other malignant neoplasm of large intestine: Secondary | ICD-10-CM | POA: Diagnosis not present

## 2021-09-23 DIAGNOSIS — J310 Chronic rhinitis: Secondary | ICD-10-CM | POA: Diagnosis not present

## 2021-09-23 DIAGNOSIS — J9622 Acute and chronic respiratory failure with hypercapnia: Secondary | ICD-10-CM | POA: Diagnosis not present

## 2021-09-23 DIAGNOSIS — Z9981 Dependence on supplemental oxygen: Secondary | ICD-10-CM | POA: Diagnosis not present

## 2021-09-23 DIAGNOSIS — I83899 Varicose veins of unspecified lower extremities with other complications: Secondary | ICD-10-CM | POA: Diagnosis not present

## 2021-09-23 DIAGNOSIS — J45909 Unspecified asthma, uncomplicated: Secondary | ICD-10-CM | POA: Diagnosis not present

## 2021-09-23 DIAGNOSIS — E119 Type 2 diabetes mellitus without complications: Secondary | ICD-10-CM | POA: Diagnosis not present

## 2021-09-23 DIAGNOSIS — I951 Orthostatic hypotension: Secondary | ICD-10-CM | POA: Diagnosis not present

## 2021-09-23 DIAGNOSIS — Z7951 Long term (current) use of inhaled steroids: Secondary | ICD-10-CM | POA: Diagnosis not present

## 2021-09-23 DIAGNOSIS — J439 Emphysema, unspecified: Secondary | ICD-10-CM | POA: Diagnosis not present

## 2021-09-23 DIAGNOSIS — G9340 Encephalopathy, unspecified: Secondary | ICD-10-CM | POA: Diagnosis not present

## 2021-09-23 DIAGNOSIS — I428 Other cardiomyopathies: Secondary | ICD-10-CM | POA: Diagnosis not present

## 2021-09-23 DIAGNOSIS — J9621 Acute and chronic respiratory failure with hypoxia: Secondary | ICD-10-CM | POA: Diagnosis not present

## 2021-09-23 DIAGNOSIS — D649 Anemia, unspecified: Secondary | ICD-10-CM | POA: Diagnosis not present

## 2021-09-23 DIAGNOSIS — I119 Hypertensive heart disease without heart failure: Secondary | ICD-10-CM | POA: Diagnosis not present

## 2021-09-23 DIAGNOSIS — Z87442 Personal history of urinary calculi: Secondary | ICD-10-CM | POA: Diagnosis not present

## 2021-09-23 DIAGNOSIS — I878 Other specified disorders of veins: Secondary | ICD-10-CM | POA: Diagnosis not present

## 2021-09-23 DIAGNOSIS — K5730 Diverticulosis of large intestine without perforation or abscess without bleeding: Secondary | ICD-10-CM | POA: Diagnosis not present

## 2021-09-23 DIAGNOSIS — E785 Hyperlipidemia, unspecified: Secondary | ICD-10-CM | POA: Diagnosis not present

## 2021-09-24 DIAGNOSIS — I83899 Varicose veins of unspecified lower extremities with other complications: Secondary | ICD-10-CM | POA: Diagnosis not present

## 2021-09-24 DIAGNOSIS — J9622 Acute and chronic respiratory failure with hypercapnia: Secondary | ICD-10-CM | POA: Diagnosis not present

## 2021-09-24 DIAGNOSIS — Z9981 Dependence on supplemental oxygen: Secondary | ICD-10-CM | POA: Diagnosis not present

## 2021-09-24 DIAGNOSIS — I428 Other cardiomyopathies: Secondary | ICD-10-CM | POA: Diagnosis not present

## 2021-09-24 DIAGNOSIS — Z7982 Long term (current) use of aspirin: Secondary | ICD-10-CM | POA: Diagnosis not present

## 2021-09-24 DIAGNOSIS — J45909 Unspecified asthma, uncomplicated: Secondary | ICD-10-CM | POA: Diagnosis not present

## 2021-09-24 DIAGNOSIS — Z7951 Long term (current) use of inhaled steroids: Secondary | ICD-10-CM | POA: Diagnosis not present

## 2021-09-24 DIAGNOSIS — K5730 Diverticulosis of large intestine without perforation or abscess without bleeding: Secondary | ICD-10-CM | POA: Diagnosis not present

## 2021-09-24 DIAGNOSIS — I951 Orthostatic hypotension: Secondary | ICD-10-CM | POA: Diagnosis not present

## 2021-09-24 DIAGNOSIS — E785 Hyperlipidemia, unspecified: Secondary | ICD-10-CM | POA: Diagnosis not present

## 2021-09-24 DIAGNOSIS — Z85038 Personal history of other malignant neoplasm of large intestine: Secondary | ICD-10-CM | POA: Diagnosis not present

## 2021-09-24 DIAGNOSIS — J439 Emphysema, unspecified: Secondary | ICD-10-CM | POA: Diagnosis not present

## 2021-09-24 DIAGNOSIS — D649 Anemia, unspecified: Secondary | ICD-10-CM | POA: Diagnosis not present

## 2021-09-24 DIAGNOSIS — J310 Chronic rhinitis: Secondary | ICD-10-CM | POA: Diagnosis not present

## 2021-09-24 DIAGNOSIS — E119 Type 2 diabetes mellitus without complications: Secondary | ICD-10-CM | POA: Diagnosis not present

## 2021-09-24 DIAGNOSIS — Z87891 Personal history of nicotine dependence: Secondary | ICD-10-CM | POA: Diagnosis not present

## 2021-09-24 DIAGNOSIS — J9621 Acute and chronic respiratory failure with hypoxia: Secondary | ICD-10-CM | POA: Diagnosis not present

## 2021-09-24 DIAGNOSIS — I878 Other specified disorders of veins: Secondary | ICD-10-CM | POA: Diagnosis not present

## 2021-09-24 DIAGNOSIS — Z87442 Personal history of urinary calculi: Secondary | ICD-10-CM | POA: Diagnosis not present

## 2021-09-24 DIAGNOSIS — I119 Hypertensive heart disease without heart failure: Secondary | ICD-10-CM | POA: Diagnosis not present

## 2021-09-24 DIAGNOSIS — G9340 Encephalopathy, unspecified: Secondary | ICD-10-CM | POA: Diagnosis not present

## 2021-09-26 ENCOUNTER — Other Ambulatory Visit: Payer: Self-pay

## 2021-09-26 ENCOUNTER — Encounter (HOSPITAL_COMMUNITY): Payer: Self-pay | Admitting: Emergency Medicine

## 2021-09-26 ENCOUNTER — Emergency Department (HOSPITAL_COMMUNITY): Payer: Medicare Other

## 2021-09-26 ENCOUNTER — Inpatient Hospital Stay (HOSPITAL_COMMUNITY)
Admission: EM | Admit: 2021-09-26 | Discharge: 2021-09-29 | DRG: 682 | Disposition: A | Discharge status: Home / Self Care | Payer: Medicare Other | Attending: Family Medicine | Admitting: Family Medicine

## 2021-09-26 DIAGNOSIS — M7989 Other specified soft tissue disorders: Secondary | ICD-10-CM | POA: Diagnosis not present

## 2021-09-26 DIAGNOSIS — D638 Anemia in other chronic diseases classified elsewhere: Secondary | ICD-10-CM | POA: Diagnosis present

## 2021-09-26 DIAGNOSIS — Z66 Do not resuscitate: Secondary | ICD-10-CM | POA: Diagnosis not present

## 2021-09-26 DIAGNOSIS — K921 Melena: Secondary | ICD-10-CM | POA: Diagnosis not present

## 2021-09-26 DIAGNOSIS — M47812 Spondylosis without myelopathy or radiculopathy, cervical region: Secondary | ICD-10-CM | POA: Diagnosis not present

## 2021-09-26 DIAGNOSIS — D649 Anemia, unspecified: Secondary | ICD-10-CM | POA: Diagnosis not present

## 2021-09-26 DIAGNOSIS — I214 Non-ST elevation (NSTEMI) myocardial infarction: Secondary | ICD-10-CM

## 2021-09-26 DIAGNOSIS — J984 Other disorders of lung: Secondary | ICD-10-CM | POA: Diagnosis not present

## 2021-09-26 DIAGNOSIS — E785 Hyperlipidemia, unspecified: Secondary | ICD-10-CM | POA: Diagnosis present

## 2021-09-26 DIAGNOSIS — E872 Acidosis, unspecified: Secondary | ICD-10-CM | POA: Diagnosis present

## 2021-09-26 DIAGNOSIS — Y92000 Kitchen of unspecified non-institutional (private) residence as  the place of occurrence of the external cause: Secondary | ICD-10-CM

## 2021-09-26 DIAGNOSIS — I714 Abdominal aortic aneurysm, without rupture, unspecified: Secondary | ICD-10-CM | POA: Diagnosis not present

## 2021-09-26 DIAGNOSIS — Z833 Family history of diabetes mellitus: Secondary | ICD-10-CM

## 2021-09-26 DIAGNOSIS — J322 Chronic ethmoidal sinusitis: Secondary | ICD-10-CM | POA: Diagnosis not present

## 2021-09-26 DIAGNOSIS — E860 Dehydration: Secondary | ICD-10-CM | POA: Diagnosis present

## 2021-09-26 DIAGNOSIS — E876 Hypokalemia: Secondary | ICD-10-CM | POA: Diagnosis not present

## 2021-09-26 DIAGNOSIS — Z8249 Family history of ischemic heart disease and other diseases of the circulatory system: Secondary | ICD-10-CM

## 2021-09-26 DIAGNOSIS — J320 Chronic maxillary sinusitis: Secondary | ICD-10-CM | POA: Diagnosis present

## 2021-09-26 DIAGNOSIS — J439 Emphysema, unspecified: Secondary | ICD-10-CM | POA: Diagnosis not present

## 2021-09-26 DIAGNOSIS — E871 Hypo-osmolality and hyponatremia: Secondary | ICD-10-CM | POA: Diagnosis present

## 2021-09-26 DIAGNOSIS — L89322 Pressure ulcer of left buttock, stage 2: Secondary | ICD-10-CM | POA: Diagnosis not present

## 2021-09-26 DIAGNOSIS — L89152 Pressure ulcer of sacral region, stage 2: Secondary | ICD-10-CM | POA: Diagnosis present

## 2021-09-26 DIAGNOSIS — S0990XA Unspecified injury of head, initial encounter: Secondary | ICD-10-CM | POA: Diagnosis not present

## 2021-09-26 DIAGNOSIS — J189 Pneumonia, unspecified organism: Secondary | ICD-10-CM

## 2021-09-26 DIAGNOSIS — D696 Thrombocytopenia, unspecified: Secondary | ICD-10-CM | POA: Diagnosis present

## 2021-09-26 DIAGNOSIS — N179 Acute kidney failure, unspecified: Principal | ICD-10-CM | POA: Diagnosis present

## 2021-09-26 DIAGNOSIS — N289 Disorder of kidney and ureter, unspecified: Secondary | ICD-10-CM

## 2021-09-26 DIAGNOSIS — I4891 Unspecified atrial fibrillation: Secondary | ICD-10-CM | POA: Diagnosis present

## 2021-09-26 DIAGNOSIS — I959 Hypotension, unspecified: Secondary | ICD-10-CM | POA: Diagnosis present

## 2021-09-26 DIAGNOSIS — S199XXA Unspecified injury of neck, initial encounter: Secondary | ICD-10-CM | POA: Diagnosis not present

## 2021-09-26 DIAGNOSIS — M4312 Spondylolisthesis, cervical region: Secondary | ICD-10-CM | POA: Diagnosis not present

## 2021-09-26 DIAGNOSIS — Z841 Family history of disorders of kidney and ureter: Secondary | ICD-10-CM

## 2021-09-26 DIAGNOSIS — Z7982 Long term (current) use of aspirin: Secondary | ICD-10-CM

## 2021-09-26 DIAGNOSIS — Z8679 Personal history of other diseases of the circulatory system: Secondary | ICD-10-CM | POA: Diagnosis not present

## 2021-09-26 DIAGNOSIS — I10 Essential (primary) hypertension: Secondary | ICD-10-CM | POA: Diagnosis not present

## 2021-09-26 DIAGNOSIS — K5733 Diverticulitis of large intestine without perforation or abscess with bleeding: Secondary | ICD-10-CM | POA: Diagnosis not present

## 2021-09-26 DIAGNOSIS — E11649 Type 2 diabetes mellitus with hypoglycemia without coma: Secondary | ICD-10-CM | POA: Diagnosis not present

## 2021-09-26 DIAGNOSIS — J0130 Acute sphenoidal sinusitis, unspecified: Secondary | ICD-10-CM | POA: Diagnosis not present

## 2021-09-26 DIAGNOSIS — R197 Diarrhea, unspecified: Secondary | ICD-10-CM

## 2021-09-26 DIAGNOSIS — Z743 Need for continuous supervision: Secondary | ICD-10-CM | POA: Diagnosis not present

## 2021-09-26 DIAGNOSIS — S0993XA Unspecified injury of face, initial encounter: Secondary | ICD-10-CM | POA: Diagnosis not present

## 2021-09-26 DIAGNOSIS — S299XXA Unspecified injury of thorax, initial encounter: Secondary | ICD-10-CM | POA: Diagnosis not present

## 2021-09-26 DIAGNOSIS — K5792 Diverticulitis of intestine, part unspecified, without perforation or abscess without bleeding: Secondary | ICD-10-CM

## 2021-09-26 DIAGNOSIS — Z20822 Contact with and (suspected) exposure to covid-19: Secondary | ICD-10-CM | POA: Diagnosis present

## 2021-09-26 DIAGNOSIS — N281 Cyst of kidney, acquired: Secondary | ICD-10-CM

## 2021-09-26 DIAGNOSIS — Z23 Encounter for immunization: Secondary | ICD-10-CM

## 2021-09-26 DIAGNOSIS — S91211A Laceration without foreign body of right great toe with damage to nail, initial encounter: Secondary | ICD-10-CM | POA: Diagnosis present

## 2021-09-26 DIAGNOSIS — W010XXA Fall on same level from slipping, tripping and stumbling without subsequent striking against object, initial encounter: Secondary | ICD-10-CM | POA: Diagnosis present

## 2021-09-26 DIAGNOSIS — Z825 Family history of asthma and other chronic lower respiratory diseases: Secondary | ICD-10-CM

## 2021-09-26 DIAGNOSIS — D500 Iron deficiency anemia secondary to blood loss (chronic): Secondary | ICD-10-CM | POA: Diagnosis not present

## 2021-09-26 DIAGNOSIS — R6889 Other general symptoms and signs: Secondary | ICD-10-CM | POA: Diagnosis not present

## 2021-09-26 DIAGNOSIS — Z79899 Other long term (current) drug therapy: Secondary | ICD-10-CM

## 2021-09-26 DIAGNOSIS — R0902 Hypoxemia: Secondary | ICD-10-CM | POA: Diagnosis not present

## 2021-09-26 DIAGNOSIS — Z8701 Personal history of pneumonia (recurrent): Secondary | ICD-10-CM

## 2021-09-26 DIAGNOSIS — I700 Atherosclerosis of aorta: Secondary | ICD-10-CM | POA: Diagnosis not present

## 2021-09-26 DIAGNOSIS — R911 Solitary pulmonary nodule: Secondary | ICD-10-CM | POA: Diagnosis present

## 2021-09-26 DIAGNOSIS — L89326 Pressure-induced deep tissue damage of left buttock: Secondary | ICD-10-CM | POA: Diagnosis present

## 2021-09-26 DIAGNOSIS — K922 Gastrointestinal hemorrhage, unspecified: Secondary | ICD-10-CM

## 2021-09-26 DIAGNOSIS — R531 Weakness: Secondary | ICD-10-CM | POA: Diagnosis not present

## 2021-09-26 DIAGNOSIS — M4322 Fusion of spine, cervical region: Secondary | ICD-10-CM | POA: Diagnosis not present

## 2021-09-26 DIAGNOSIS — L8990 Pressure ulcer of unspecified site, unspecified stage: Secondary | ICD-10-CM | POA: Diagnosis present

## 2021-09-26 DIAGNOSIS — I447 Left bundle-branch block, unspecified: Secondary | ICD-10-CM | POA: Diagnosis not present

## 2021-09-26 DIAGNOSIS — Z87891 Personal history of nicotine dependence: Secondary | ICD-10-CM

## 2021-09-26 DIAGNOSIS — L89312 Pressure ulcer of right buttock, stage 2: Secondary | ICD-10-CM | POA: Diagnosis present

## 2021-09-26 DIAGNOSIS — R031 Nonspecific low blood-pressure reading: Secondary | ICD-10-CM | POA: Diagnosis not present

## 2021-09-26 DIAGNOSIS — J329 Chronic sinusitis, unspecified: Secondary | ICD-10-CM | POA: Diagnosis not present

## 2021-09-26 DIAGNOSIS — Z86010 Personal history of colonic polyps: Secondary | ICD-10-CM

## 2021-09-26 DIAGNOSIS — Z8546 Personal history of malignant neoplasm of prostate: Secondary | ICD-10-CM

## 2021-09-26 DIAGNOSIS — R296 Repeated falls: Secondary | ICD-10-CM | POA: Diagnosis present

## 2021-09-26 DIAGNOSIS — R109 Unspecified abdominal pain: Secondary | ICD-10-CM | POA: Diagnosis not present

## 2021-09-26 DIAGNOSIS — Z87442 Personal history of urinary calculi: Secondary | ICD-10-CM

## 2021-09-26 HISTORY — DX: Disorder of kidney and ureter, unspecified: N28.9

## 2021-09-26 LAB — I-STAT VENOUS BLOOD GAS, ED
Acid-base deficit: 3 mmol/L — ABNORMAL HIGH (ref 0.0–2.0)
Bicarbonate: 22.8 mmol/L (ref 20.0–28.0)
Calcium, Ion: 1.03 mmol/L — ABNORMAL LOW (ref 1.15–1.40)
HCT: 28 % — ABNORMAL LOW (ref 39.0–52.0)
Hemoglobin: 9.5 g/dL — ABNORMAL LOW (ref 13.0–17.0)
O2 Saturation: 99 %
Potassium: 3.9 mmol/L (ref 3.5–5.1)
Sodium: 130 mmol/L — ABNORMAL LOW (ref 135–145)
TCO2: 24 mmol/L (ref 22–32)
pCO2, Ven: 42.1 mmHg — ABNORMAL LOW (ref 44.0–60.0)
pH, Ven: 7.342 (ref 7.250–7.430)
pO2, Ven: 146 mmHg — ABNORMAL HIGH (ref 32.0–45.0)

## 2021-09-26 LAB — CBC WITH DIFFERENTIAL/PLATELET
Abs Immature Granulocytes: 0.08 10*3/uL — ABNORMAL HIGH (ref 0.00–0.07)
Basophils Absolute: 0 10*3/uL (ref 0.0–0.1)
Basophils Relative: 0 %
Eosinophils Absolute: 0.2 10*3/uL (ref 0.0–0.5)
Eosinophils Relative: 3 %
HCT: 27.7 % — ABNORMAL LOW (ref 39.0–52.0)
Hemoglobin: 9.2 g/dL — ABNORMAL LOW (ref 13.0–17.0)
Immature Granulocytes: 1 %
Lymphocytes Relative: 23 %
Lymphs Abs: 1.7 10*3/uL (ref 0.7–4.0)
MCH: 29.5 pg (ref 26.0–34.0)
MCHC: 33.2 g/dL (ref 30.0–36.0)
MCV: 88.8 fL (ref 80.0–100.0)
Monocytes Absolute: 0.6 10*3/uL (ref 0.1–1.0)
Monocytes Relative: 8 %
Neutro Abs: 5 10*3/uL (ref 1.7–7.7)
Neutrophils Relative %: 65 %
Platelets: 141 10*3/uL — ABNORMAL LOW (ref 150–400)
RBC: 3.12 MIL/uL — ABNORMAL LOW (ref 4.22–5.81)
RDW: 16.8 % — ABNORMAL HIGH (ref 11.5–15.5)
WBC: 7.7 10*3/uL (ref 4.0–10.5)
nRBC: 0 % (ref 0.0–0.2)

## 2021-09-26 LAB — RESP PANEL BY RT-PCR (FLU A&B, COVID) ARPGX2
Influenza A by PCR: NEGATIVE
Influenza B by PCR: NEGATIVE
SARS Coronavirus 2 by RT PCR: NEGATIVE

## 2021-09-26 LAB — COMPREHENSIVE METABOLIC PANEL
ALT: 46 U/L — ABNORMAL HIGH (ref 0–44)
AST: 33 U/L (ref 15–41)
Albumin: 2.6 g/dL — ABNORMAL LOW (ref 3.5–5.0)
Alkaline Phosphatase: 38 U/L (ref 38–126)
Anion gap: 17 — ABNORMAL HIGH (ref 5–15)
BUN: 86 mg/dL — ABNORMAL HIGH (ref 8–23)
CO2: 17 mmol/L — ABNORMAL LOW (ref 22–32)
Calcium: 8 mg/dL — ABNORMAL LOW (ref 8.9–10.3)
Chloride: 96 mmol/L — ABNORMAL LOW (ref 98–111)
Creatinine, Ser: 5.89 mg/dL — ABNORMAL HIGH (ref 0.61–1.24)
GFR, Estimated: 9 mL/min — ABNORMAL LOW (ref >60)
Glucose, Bld: 93 mg/dL (ref 70–99)
Potassium: 3.8 mmol/L (ref 3.5–5.1)
Sodium: 130 mmol/L — ABNORMAL LOW (ref 135–145)
Total Bilirubin: 0.7 mg/dL (ref 0.3–1.2)
Total Protein: 5.5 g/dL — ABNORMAL LOW (ref 6.5–8.1)

## 2021-09-26 LAB — BRAIN NATRIURETIC PEPTIDE: B Natriuretic Peptide: 118.8 pg/mL — ABNORMAL HIGH (ref 0.0–100.0)

## 2021-09-26 LAB — LACTIC ACID, PLASMA: Lactic Acid, Venous: 0.9 mmol/L (ref 0.5–1.9)

## 2021-09-26 LAB — PROCALCITONIN: Procalcitonin: 1.07 ng/mL

## 2021-09-26 LAB — LIPASE, BLOOD: Lipase: 49 U/L (ref 11–51)

## 2021-09-26 LAB — TROPONIN I (HIGH SENSITIVITY)
Troponin I (High Sensitivity): 18 ng/L — ABNORMAL HIGH (ref <18)
Troponin I (High Sensitivity): 21 ng/L — ABNORMAL HIGH (ref <18)

## 2021-09-26 LAB — POC OCCULT BLOOD, ED: Fecal Occult Bld: POSITIVE — AB

## 2021-09-26 MED ORDER — AZITHROMYCIN 500 MG IV SOLR
500.0000 mg | Freq: Once | INTRAVENOUS | Status: AC
  Administered 2021-09-26: 500 mg via INTRAVENOUS
  Filled 2021-09-26: qty 5

## 2021-09-26 MED ORDER — LACTATED RINGERS IV BOLUS
1000.0000 mL | Freq: Once | INTRAVENOUS | Status: AC
Start: 2021-09-26 — End: 2021-09-26
  Administered 2021-09-26: 1000 mL via INTRAVENOUS

## 2021-09-26 MED ORDER — CEFTRIAXONE SODIUM 1 G IJ SOLR
1.0000 g | Freq: Once | INTRAMUSCULAR | Status: AC
  Administered 2021-09-26: 1 g via INTRAVENOUS
  Filled 2021-09-26: qty 10

## 2021-09-26 MED ORDER — SODIUM CHLORIDE 0.9 % IV BOLUS
500.0000 mL | Freq: Once | INTRAVENOUS | Status: AC
  Administered 2021-09-26: 500 mL via INTRAVENOUS

## 2021-09-26 MED ORDER — PANTOPRAZOLE INFUSION (NEW) - SIMPLE MED
8.0000 mg/h | INTRAVENOUS | Status: DC
  Administered 2021-09-26: 8 mg/h via INTRAVENOUS
  Filled 2021-09-26: qty 100
  Filled 2021-09-26: qty 80
  Filled 2021-09-26: qty 100

## 2021-09-26 MED ORDER — TETANUS-DIPHTH-ACELL PERTUSSIS 5-2.5-18.5 LF-MCG/0.5 IM SUSY
0.5000 mL | PREFILLED_SYRINGE | Freq: Once | INTRAMUSCULAR | Status: AC
  Administered 2021-09-26: 0.5 mL via INTRAMUSCULAR
  Filled 2021-09-26: qty 0.5

## 2021-09-26 MED ORDER — SODIUM CHLORIDE 0.9 % IV SOLN: INTRAVENOUS | Status: DC

## 2021-09-26 MED ORDER — PANTOPRAZOLE SODIUM 40 MG IV SOLR
40.0000 mg | Freq: Once | INTRAVENOUS | Status: AC
Start: 2021-09-26 — End: 2021-09-26
  Administered 2021-09-26: 40 mg via INTRAVENOUS
  Filled 2021-09-26: qty 40

## 2021-09-26 NOTE — ED Provider Notes (Signed)
[facility] EMERGENCY DEPARTMENT Provider Note   CSN: [id number] Arrival date & time: 09/26/21  1110     History  Chief Complaint  Patient presents with   Weakness    Harry Norris is a 86 y.o. male.  This is a 86 y.o. male with significant medical history as below, including cardiomyopathy EF 35%, hypertension, hyperlipidemia, left bundle branch block, prostate cancer, Aaa who presents to the ED with complaint of weakness.  Multiple falls.  Patient is a poor historian.  Reports that he has been feeling weak and tired the last few days.  Is been having diarrhea.  Mild discomfort to his abdomen.  No vomiting.  Poor p.o. intake.  Diffuse medications.  Patient had a fall earlier today, he does believe that he passed out.  He believes that he lost consciousness after the fall, +head injury, - thinners.  No chest pain, dyspnea, palpitations.  Denies blood thinners.  Evidence of facial trauma and foot trauma were noted on exam.    Past Medical History: No date: AAA (abdominal aortic aneurysm) No date: Anemia 02/07/2018: Asthma No date: Benign neoplasm of colon 12/19/2018: Bradycardia No date: Bronchitis No date: Cancer [facility])     Comment:  prostate 12/18/2015: Cardiomyopathy ([place]) No date: Carotid artery occlusion 02/26/2016: Chronic rhinitis 12/18/2015: Chronic venous insufficiency 02/27/2014: COPD with emphysema [facility]      Comment:  PFTs 01/18/14:  FeV1 1.58 45% FVC 2.0 50% FeV1/FVC 79%               Fef 25-75 795 ( 40% improved after BD) CXR: Copd changes               ONO 03/07/14 desats to <88% on RA  No date: Diverticulosis of colon (without mention of hemorrhage) No date: DM type 2 (diabetes mellitus, type 2) ([place]) 12/12/2018: Fatigue No date: Hyperlipidemia No date: Hypertension No date: Hypertensive heart disease 04/10/2014: Hypoxemia     Comment:  Overview:  Last Assessment & Plan:  Cont nocturnal               oxygen therapy 2L  No date: Kidney  stone 12/18/2015: LBBB (left bundle branch block) 11/28/2013: Leg swelling 12/19/2018: Orthostatic hypotension No date: Other primary cardiomyopathies 11/28/2013: Other symptoms involving cardiovascular system No date: Prostate cancer [facility])     Comment:  prostate  12/18/2015: Syncope No date: Unspecified venous (peripheral) insufficiency No date: Varicose veins 11/28/2013: Varicose veins of lower extremities with other  complications  Past Surgical History: may 2011:  cardiac catheterization No date: CATARACT EXTRACTION; Bilateral 03-29-2014: ENDOVENOUS ABLATION SAPHENOUS VEIN W/ LASER; Right     Comment:  EVLA right greater saphenous vein   2011: gold seed implants     Comment:  treatment of prostate cancer No date: HERNIA REPAIR; Bilateral 2010: LITHOTRIPSY No date: right ear surgery    The history is provided by the patient. No language interpreter was used.  Weakness Associated symptoms: abdominal pain, diarrhea and headaches   Associated symptoms: no chest pain, no cough, no fever, no nausea, no shortness of breath and no vomiting       Home Medications Prior to Admission medications   Medication Sig Start Date End Date Taking? Authorizing Provider  albuterol (VENTOLIN HFA) 108 (90 Base) MCG/ACT inhaler Inhale 2 puffs into the lungs every 6 (six) hours as needed for wheezing or shortness of breath.    [provider]  arformoterol (BROVANA) 15 MCG/2ML NEBU Take 2 mLs (15  mcg total) by nebulization 2 (two) times daily. Patient not taking: Reported on 08/07/2021 08/03/21   Little Ishikawa, MD  aspirin EC 81 MG tablet Take 81 mg by mouth daily. Swallow whole.    [provider]  atorvastatin (LIPITOR) 40 MG tablet Take 40 mg by mouth at bedtime.    [provider]  budesonide (PULMICORT) 180 MCG/ACT inhaler Inhale 2 puffs into the lungs 2 (two) times daily. Patient not taking: Reported on 07/29/2021    [provider]  carvedilol (COREG)  12.5 MG tablet Take 12.5 mg by mouth in the morning and at bedtime.    [provider]  fenofibrate 160 MG tablet Take 160 mg by mouth daily.    [provider]  furosemide (LASIX) 40 MG tablet Take 40 mg by mouth in the morning.    [provider]  Multiple Vitamins-Minerals (CENTRUM SILVER 50+MEN) TABS Take 1 tablet by mouth daily with breakfast.    [provider]  naproxen (NAPROSYN) 500 MG tablet Take 500 mg by mouth daily as needed (for pain).    [provider]  Omega-3 Fatty Acids (FISH OIL) 1000 MG CAPS Take 1,000 mg by mouth 2 (two) times daily.    [provider]  OXYGEN Inhale 2 L/min into the lungs as needed (for shortness of breath).    [provider]  oxymetazoline (AFRIN) 0.05 % nasal spray Place 1 spray into both nostrils 2 (two) times daily as needed for congestion.    [provider]  revefenacin (YUPELRI) 175 MCG/3ML nebulizer solution Take 3 mLs (175 mcg total) by nebulization daily. Patient not taking: Reported on 08/07/2021 08/04/21   Little Ishikawa, MD  vitamin C (ASCORBIC ACID) 500 MG tablet Take 500 mg by mouth daily.    [provider]  vitamin E 400 UNIT capsule Take 400 Units by mouth daily.    [provider]      Allergies    Ace inhibitors    Review of Systems   Review of Systems  Constitutional:  Positive for appetite change and fatigue. Negative for chills and fever.  HENT:  Negative for facial swelling and trouble swallowing.   Eyes:  Negative for photophobia and visual disturbance.  Respiratory:  Negative for cough and shortness of breath.   Cardiovascular:  Negative for chest pain and palpitations.  Gastrointestinal:  Positive for abdominal pain and diarrhea. Negative for nausea and vomiting.  Endocrine: Negative for polydipsia and polyuria.  Genitourinary:  Negative for difficulty urinating and hematuria.  Musculoskeletal:  Negative for gait problem and joint  swelling.  Skin:  Negative for pallor and rash.  Neurological:  Positive for syncope, weakness and headaches.  Psychiatric/Behavioral:  Negative for agitation and confusion.    Physical Exam Updated Vital Signs BP (!) 111/44    Pulse 73    Resp 20    SpO2 96%  Physical Exam Vitals and nursing note reviewed. Exam conducted with a chaperone present.  Constitutional:      General: He is not in acute distress.    Appearance: Normal appearance. He is well-developed.     Comments: Dried stool to lower extremities  HENT:     Head: Normocephalic and atraumatic.     Right Ear: External ear normal.     Left Ear: External ear normal.     Mouth/Throat:     Mouth: Mucous membranes are moist.  Eyes:     General: No scleral icterus. Cardiovascular:  Rate and Rhythm: Normal rate and regular rhythm.     Pulses: Normal pulses.     Heart sounds: Normal heart sounds.  Pulmonary:     Effort: Pulmonary effort is normal.     Breath sounds: Normal breath sounds. No wheezing.  Abdominal:     General: Abdomen is flat.     Palpations: Abdomen is soft.     Tenderness: There is no abdominal tenderness.  Genitourinary:    Comments: Brown stool to anal opening, no palpable hemorrhoids. Small lesion lateral to anus. Irritation to sacrum, no ulceration.  Musculoskeletal:        General: Normal range of motion.     Cervical back: Normal range of motion.     Right lower leg: No edema.     Left lower leg: No edema.  Feet:     Comments: Nail avulsion to r hallux Skin:    General: Skin is warm and dry.     Capillary Refill: Capillary refill takes less than 2 seconds.  Neurological:     Mental Status: He is alert and oriented to person, place, and time.  Psychiatric:        Mood and Affect: Mood normal.        Behavior: Behavior normal.    ED Results / Procedures / Treatments   Labs (all labs ordered are listed, but only abnormal results are displayed) Labs Reviewed  CBC WITH  DIFFERENTIAL/PLATELET - Abnormal; Notable for the following components:      Result Value   RBC 3.12 (*)    Hemoglobin 9.2 (*)    HCT 27.7 (*)    RDW 16.8 (*)    Platelets 141 (*)    Abs Immature Granulocytes 0.08 (*)    All other components within normal limits  COMPREHENSIVE METABOLIC PANEL - Abnormal; Notable for the following components:   Sodium 130 (*)    Chloride 96 (*)    CO2 17 (*)    BUN 86 (*)    Creatinine, Ser 5.89 (*)    Calcium 8.0 (*)    Total Protein 5.5 (*)    Albumin 2.6 (*)    ALT 46 (*)    GFR, Estimated 9 (*)    Anion gap 17 (*)    All other components within normal limits  BRAIN NATRIURETIC PEPTIDE - Abnormal; Notable for the following components:   B Natriuretic Peptide 118.8 (*)    All other components within normal limits  I-STAT VENOUS BLOOD GAS, ED - Abnormal; Notable for the following components:   pCO2, Ven 42.1 (*)    pO2, Ven 146.0 (*)    Acid-base deficit 3.0 (*)    Sodium 130 (*)    Calcium, Ion 1.03 (*)    HCT 28.0 (*)    Hemoglobin 9.5 (*)    All other components within normal limits  POC OCCULT BLOOD, ED - Abnormal; Notable for the following components:   Fecal Occult Bld POSITIVE (*)    All other components within normal limits  TROPONIN I (HIGH SENSITIVITY) - Abnormal; Notable for the following components:   Troponin I (High Sensitivity) 18 (*)    All other components within normal limits  TROPONIN I (HIGH SENSITIVITY) - Abnormal; Notable for the following components:   Troponin I (High Sensitivity) 21 (*)    All other components within normal limits  RESP PANEL BY RT-PCR (FLU A&B, COVID) ARPGX2  CULTURE, BLOOD (ROUTINE X 2)  CULTURE, BLOOD (ROUTINE X 2)  EXPECTORATED SPUTUM  ASSESSMENT W GRAM STAIN, RFLX TO RESP C  LIPASE, BLOOD  URINALYSIS, ROUTINE W REFLEX MICROSCOPIC  LACTIC ACID, PLASMA  LACTIC ACID, PLASMA  LEGIONELLA PNEUMOPHILA SEROGP 1 UR AG  PROCALCITONIN  CBG MONITORING, ED    EKG EKG  Interpretation  Date/Time:  Friday September 26 2021 11:18:11 EST Ventricular Rate:  69 PR Interval:  167 QRS Duration: 171 QT Interval:  508 QTC Calculation: 459 R Axis:   75 Text Interpretation: Bradycardia with irregular rate Nonspecific intraventricular conduction delay Borderline T abnormalities, lateral leads in a pattern of bigeminy similar to prior Confirmed by Wynona Dove ([id number]) on 09/26/2021 12:16:34 PM  Radiology CT ABDOMEN PELVIS WO CONTRAST  Result Date: 09/26/2021 CLINICAL DATA:  Acute abdominal pain with fall. EXAM: CT ABDOMEN AND PELVIS WITHOUT CONTRAST TECHNIQUE: Multidetector CT imaging of the abdomen and pelvis was performed following the standard protocol without IV contrast. RADIATION DOSE REDUCTION: This exam was performed according to the departmental dose-optimization program which includes automated exposure control, adjustment of the mA and/or kV according to patient size and/or use of iterative reconstruction technique. COMPARISON:  Plain film 07/29/2021. CTA of the abdomen and pelvis of 06/06/2021. FINDINGS: Lower chest: Centrilobular emphysema. Bibasilar patchy airspace disease with areas of cylindrical bronchiectasis. No basilar pulmonary contusion or pneumothorax. 6 mm left lower lobe pulmonary nodule on 16/4 is likely new since the prior. Normal heart size without pericardial or pleural effusion. Lad coronary artery calcification. Hepatobiliary: Normal liver. Normal gallbladder, without biliary ductal dilatation. Pancreas: Normal, without mass or ductal dilatation. Spleen: Normal in size, without focal abnormality. Adrenals/Urinary Tract: Normal adrenal glands. Bilateral renal cortical thinning. Within both kidneys, there are renal lesions of varying complexity. Maximally 2.7 cm in the interpolar right kidney which is most consistent with a minimally complex cyst. Other lesions measure greater than fluid density, including at 1.7 cm in the interpolar right kidney on 36/3,  and scattered throughout the left kidney including at 1.8 cm in the interpolar region on 41/3. When comparing to the CTA of 06/06/2021, some demonstrate possible enhancement, including the above 1.7 cm interpolar right renal lesion. No hydronephrosis.  No bladder calculi. Stomach/Bowel: Tiny hiatal hernia. Extensive colonic diverticulosis. Edema is seen adjacent to the distal descending colon including on 49/3. Normal terminal ileum and appendix. Minimal small bowel extends into an area of ventral pelvic wall laxity on 60/3, similar. Vascular/Lymphatic: Advanced aortic and branch vessel atherosclerosis. Infrarenal abdominal aortic aneurysm of 3.5 cm is similar to on the prior exam. No surrounding hemorrhage. A retroaortic left renal vein. No abdominopelvic adenopathy. Reproductive: Radiation seeds in the prostate. Other: No significant free fluid. Small fat containing right inguinal hernia. No free intraperitoneal air. Musculoskeletal: Lumbosacral spondylosis. S shaped thoracolumbar spine curvature. Right L1 transverse process lucency on 27/3 is similar and most likely developmental. IMPRESSION: 1. No posttraumatic deformity identified. 2. New areas of bibasilar airspace disease since 06/06/2021. Concurrent cylindrical bronchiectasis. Most consistent with infection or aspiration of indeterminate acuity. 3. Non complicated descending diverticulitis. 4. Nonobstructive small bowel extends minimally into an area of ventral pelvic wall laxity. 5. Bilateral renal lesions of varying complexity, incompletely characterized on this noncontrast exam. These could represent hemorrhagic cysts or solid neoplasms. Consider further evaluation with outpatient pre and post contrast abdominal MRI. If this 86 year old patient is not a good MRI candidate, renal protocol CT at 6 months suggested. 6.  Tiny hiatal hernia. 7. Aortic atherosclerosis (ICD10-I70.0), coronary artery atherosclerosis and emphysema (ICD10-J43.9). 8. 6 mm left lower  lobe pulmonary nodule which  is likely new since the prior. Non-contrast chest CT at 6-12 months is recommended. If the nodule is stable at time of repeat CT, then future CT at 18-24 months (from today's scan) is considered optional for low-risk patients, but is recommended for high-risk patients. This recommendation follows the consensus statement: Guidelines for Management of Incidental Pulmonary Nodules Detected on CT Images: From the Fleischner Society 2017; Radiology 2017; 284:228-243. Electronically Signed   By: Abigail Miyamoto M.D.   On: 09/26/2021 14:10   CT Head Wo Contrast  Result Date: 09/26/2021 CLINICAL DATA:  Trauma EXAM: CT HEAD WITHOUT CONTRAST TECHNIQUE: Contiguous axial images were obtained from the base of the skull through the vertex without intravenous contrast. RADIATION DOSE REDUCTION: This exam was performed according to the departmental dose-optimization program which includes automated exposure control, adjustment of the mA and/or kV according to patient size and/or use of iterative reconstruction technique. COMPARISON:  None. FINDINGS: Brain: No acute intracranial findings are seen. There are no signs of bleeding. Cavum septum pellucidum and cavum septum vergae are seen. There is no shift of midline structures. Cortical sulci are prominent. Vascular: There are scattered arterial calcifications. Skull: No fracture is seen. Sinuses/Orbits: There is small air-fluid level in sphenoid sinus. Mucosal thickening is seen in the ethmoid and maxillary sinuses. There is fluid density in the mastoid air cells on both sides. There is no break in the cortical margins in the mastoids. There is suggestion of previous cataract surgery in both optic globes. Other: None IMPRESSION: No acute intracranial findings are seen.  Atrophy. Chronic ethmoid and maxillary sinusitis. Small air-fluid level is seen in the sphenoid sinus suggesting possible acute sinusitis. There is fluid density in mastoid air cells on  both sides suggesting mastoid effusions or chronic mastoiditis. Electronically Signed   By: Elmer Picker M.D.   On: 09/26/2021 13:58   CT Cervical Spine Wo Contrast  Result Date: 09/26/2021 CLINICAL DATA:  Facial trauma, blunt force injury in a male at age 7. EXAM: CT CERVICAL SPINE WITHOUT CONTRAST TECHNIQUE: Multidetector CT imaging of the cervical spine was performed without intravenous contrast. Multiplanar CT image reconstructions were also generated. RADIATION DOSE REDUCTION: This exam was performed according to the departmental dose-optimization program which includes automated exposure control, adjustment of the mA and/or kV according to patient size and/or use of iterative reconstruction technique. COMPARISON:  None FINDINGS: Alignment: Mild straightening of normal cervical lordotic curvature. Mild anterolisthesis of C3 on C4 and C4 on C5. Skull base and vertebrae: No evidence of acute fracture. Increased basin into dens interval is just above normal range at 11 mm but without associated soft tissue swelling. There is degenerative change at C1-C2. C1 shows normal relationship to the occipital condyles. Degenerative changes at the tip of the dens and around the dens are noted. Soft tissues and spinal canal: No prevertebral fluid or swelling. No visible canal hematoma. Disc levels: Multilevel degenerative changes with disc space narrowing and facet arthropathy likely explains mild anterolisthesis of C3 on C4 and C4 on C5. RIGHT-sided C2-3 facet fusion is demonstrated. Moderate to marked facet arthropathy seen elsewhere in the cervical spine. Upper chest: Negative. Other: None IMPRESSION: 1. No acute fracture or traumatic malalignment in the cervical spine. 2. Increased basilar and dens interval, just above normal with associated degenerative changes at C1-2 and at the tip of the dens favored to represent sequela of remote trauma and or degenerative process. In the absence of priors for comparison,  if there is high clinical suspicion for  ligamentous injury MRI could be helpful for further assessment. 3. Multilevel degenerative changes with disc space narrowing and facet arthropathy likely explains mild anterolisthesis of C3 on C4 and C4 on C5. Electronically Signed   By: Zetta Bills M.D.   On: 09/26/2021 14:26   DG Chest Portable 1 View  Result Date: 09/26/2021 CLINICAL DATA:  A male at age 77 4 presents for evaluation of foot injury and fall with low blood pressure. EXAM: PORTABLE CHEST 1 VIEW COMPARISON:  July 30, 2021. FINDINGS: EKG leads project over the chest. Trachea midline. Cardiomediastinal contours and hilar structures are stable. There is bibasilar airspace disease with more linear appearance over the RIGHT as compared to the LEFT hemidiaphragm. Increased airspace process compared to more remote priors at the LEFT lung base and similar appearance as compared to prior studies with respect to RIGHT lower lobe findings. No visible pneumothorax. On limited assessment there is no acute skeletal finding. IMPRESSION: Bibasilar airspace disease likely atelectasis given morphology, likely associated with scarring at the RIGHT lung base based on prior imaging. Given increased airspace disease at the LEFT lung base however compared to prior studies would correlate with any signs of pulmonary infection. Electronically Signed   By: Zetta Bills M.D.   On: 09/26/2021 11:45   DG Foot Complete Right  Result Date: 09/26/2021 CLINICAL DATA:  Right hallux injury, fall EXAM: RIGHT FOOT COMPLETE - 3+ VIEW COMPARISON:  None. FINDINGS: No acute fracture or dislocation. Pes cavus alignment. Mild hallux valgus. Small erosion with well-defined sclerotic margin along the medial aspect of the first metatarsal head, likely sequela of crystalline arthropathy such as gout. Small plantar calcaneal spur. Soft tissue swelling over the dorsum of the forefoot. Atherosclerotic vascular calcifications are present.  IMPRESSION: 1. No acute fracture or dislocation. Soft tissue swelling over the dorsum of the forefoot. 2. Mild hallux valgus. Electronically Signed   By: Davina Poke D.O.   On: 09/26/2021 11:51   CT Maxillofacial Wo Contrast  Result Date: 09/26/2021 CLINICAL DATA:  In 86 year old male presents for evaluation of trauma. EXAM: CT MAXILLOFACIAL WITHOUT CONTRAST TECHNIQUE: Multidetector CT imaging of the maxillofacial structures was performed. Multiplanar CT image reconstructions were also generated. RADIATION DOSE REDUCTION: This exam was performed according to the departmental dose-optimization program which includes automated exposure control, adjustment of the mA and/or kV according to patient size and/or use of iterative reconstruction technique. COMPARISON:  Head CT of the same date. FINDINGS: Osseous: No fracture or mandibular dislocation. No destructive process. Orbits: Negative. No traumatic or inflammatory finding. Sinuses: Mucosal thickening in the maxillary sinuses. Small amount of debris in the dependent sphenoid sinus on the RIGHT. Scattered ethmoid mucosal thickening. No air-fluid levels in the sinuses. Incidental note is made of bilateral chronic mastoid effusions not substantially changed since imaging dating back to 2016 Soft tissues: Negative. Limited intracranial: No significant or unexpected finding. IMPRESSION: 1. No signs of acute facial bone fracture. 2. Sinus disease and chronic mastoid effusions. Electronically Signed   By: Zetta Bills M.D.   On: 09/26/2021 15:00    Procedures .Critical Care Performed by: Jeanell Sparrow, DO Authorized by: Jeanell Sparrow, DO   Critical care provider statement:    Critical care time (minutes):  71   Critical care time was exclusive of:  Separately billable procedures and treating other patients   Critical care was necessary to treat or prevent imminent or life-threatening deterioration of the following conditions:  Cardiac failure,  circulatory failure and dehydration  Critical care was time spent personally by me on the following activities:  Development of treatment plan with patient or surrogate, discussions with consultants, evaluation of patient's response to treatment, examination of patient, ordering and review of laboratory studies, ordering and review of radiographic studies, ordering and performing treatments and interventions, pulse oximetry, re-evaluation of patient's condition and review of old charts   Care discussed with: admitting provider      Medications Ordered in ED Medications  cefTRIAXone (ROCEPHIN) 1 g in sodium chloride 0.9 % 100 mL IVPB (1 g Intravenous New Bag/Given 09/26/21 1602)  azithromycin (ZITHROMAX) 500 mg in sodium chloride 0.9 % 250 mL IVPB (500 mg Intravenous New Bag/Given 09/26/21 1601)  pantoprozole (PROTONIX) 80 mg /NS 100 mL infusion (8 mg/hr Intravenous New Bag/Given 09/26/21 1600)  sodium chloride 0.9 % bolus 500 mL (0 mLs Intravenous Stopped 09/26/21 1412)  Tdap (BOOSTRIX) injection 0.5 mL (0.5 mLs Intramuscular Given 09/26/21 1410)  lactated ringers bolus 1,000 mL (1,000 mLs Intravenous New Bag/Given 09/26/21 1557)  pantoprazole (PROTONIX) injection 40 mg (40 mg Intravenous Given 09/26/21 1556)    ED Course/ Medical Decision Making/ A&P Clinical Course as of 09/26/21 1607  Fri Sep 26, 2021  1508 Rectal exam completed, stool is brown [SG]    Clinical Course User Index [SG] Jeanell Sparrow, DO                           Medical Decision Making Amount and/or Complexity of Data Reviewed External Data Reviewed: labs and radiology. Labs: ordered. Decision-making details documented in ED Course. Radiology: ordered and independent interpretation performed. Decision-making details documented in ED Course. ECG/medicine tests: ordered and independent interpretation performed. Decision-making details documented in ED Course.  Risk Prescription drug management. Decision regarding  hospitalization.    CC: weakness, recurrent falls, diarrhea, poor PO  This patient presents to the Emergency Department for the above complaint. This involves an extensive number of treatment options and is a complaint that carries with it a high risk of complications and morbidity. Vital signs were reviewed. Serious etiologies considered.  DNR confirmed with documents at bedside and with patient.      Record review:  Previous records obtained and reviewed   Medical and surgical history as noted above.   Work up as above, notable for:  Labs & imaging results that were available during my care of the patient were reviewed by me and considered in my medical decision making.   I ordered imaging studies which included CTH, c-spine, CT a/p non contrast, CXR, right foot and I independently visualized and interpreted imaging which showed no fracture to foot, possible pneumonia on CT, renal lesions bilateral, no hydronephrosis, pulmonary nodule that appears new.   Cardiac monitoring reviewed and interpreted personally which shows normal sinus rhythm  Social determinants of health include - N/a  Personally discussed patient care with consultant; nephrology  Patient with renal insufficiency, question acute renal failure.  Potassium stable.  Sodium is 130.  Patient given IV fluid resuscitation.  Patient with diarrhea, poor oral intake.  Likely etiology of renal insufficiency.  CT does not show obstructive renal lesion.   Occult positive, patient with diarrhea, no thinners.  Hemoglobin reduced from baseline.  History of colon cancer.  Given Protonix bolus + infusion.   Management: Patient with IV fluids, broad-spectrum antibiotics, Protonix  Recommend gentle rehydration given history of cardiomyopathy with reduced ejection fraction  He is on his home 2 L, no respiratory  distress.  Reassessment:  Patient reports he is feeling somewhat better, hemodynamically stable.   Plans admit  patient for pneumonia, renal insufficiency, symptomatic anemia, GIB.  Patient agreeable.   D/w nephrology regarding renal insufficiency, they will see the pt.      This chart was dictated using voice recognition software.  Despite best efforts to proofread,  errors can occur which can change the documentation meaning.         Final Clinical Impression(s) / ED Diagnoses Final diagnoses:  Dehydration  Renal insufficiency  Generalized weakness  Community acquired pneumonia, unspecified laterality  NSTEMI (non-ST elevated myocardial infarction) ([place])  Gastrointestinal hemorrhage, unspecified gastrointestinal hemorrhage type  Renal cyst  Pulmonary nodule    Rx / DC Orders ED Discharge Orders     None         Jeanell Sparrow, DO 09/26/21 1607

## 2021-09-26 NOTE — H&P (Addendum)
[facility] Admission History and Physical Service Pager: [phone]  Patient name: Harry Norris Medical record number: [id number] Date of birth: 03-Jun-1936 Age: 86 y.o. Gender: male  Primary Care Provider: [name], MD Consultants:  Code Status: DNR  Preferred Emergency Contact: Delrico Minehart (son): [phone]  [name] (son) [phone]  Chief Complaint: Weakness  Assessment and Plan: Harry Norris is a 86 y.o. male presenting with weakness and multiple falls. PMH is significant for DM, COPD, cardiomyopathy EF 35%, HLD, HTN, HLD, prostate cancer, AAA,   Weakness   Fall Patient complaining of weakness and multiple falls. Patient was at home in the kitchen, lost balance, and fell, prompting family to bring him to the ED. Patient has had multiple falls in the last few months, which family attribute to dehydration. Family notes patient hasn't been eating and drinking much. On arrival to ED patient has low DBP with vials of 111/35, pulse 71, RR 18, sating well on 2 L [place]. In the ED patient was started on azithromycin and ceftriaxone for suspected pneumonia. Labs were significant for a positive positive FOBT , Hgb of 9.2, PLT 141, Na 130, a Cr 5.89, BUN 86, GFR 9, ALT 46, anion gap 17. Trop was 18, with a BNP 118.8. CXR showed bibasilar airspace disease with possible left lung base pulmonary infection.  Foot x-ray showed no fracture or dislocation with soft tissue swelling over the dorsum of the foot. CT head showed chronic maxillary and ethmoid sinusitis, with acute sphenoid sinusitis.  CT max of facial showed sinus disease with no acute fracture.CT cervical spine showed no acute fracture or traumatic malalignment in the cervical spine.  With mild multilevel degenerative changes.  CT abdomen showed bibasilar airspace disease most consistent with infection or aspiration of indeterminate acuity.  Descending diverticulitis.  Bilateral renal lesions of varying  complexity concerning for hemorrhagic cyst or solid neoplasms.  Also showed tiny hiatal hernia.  And 6 mm pulmonary nodule in LLL. EKG was notable for irregular rhthm with prolonged qrs interval and no discernable P-waves. On physical exam patient was alert and oriented x3, with notable dry skin on face and dry mucous membranes.  Abdomen was slightly distended, but non TTP. Patient had no edema in extremities right great toenail was hanging off. Patient likely suffering from orthostasis in the setting of poor PO and diarrhea. Patient could also be suffering weakness from his blood loss causing anemia, with a Hgb 9.2 that was 12.6 last admission in December. Deconditioning also considered, given patients age, health, and recurrent falls. Son of patient noted that patient had been working with home health PT, with notable improvement, but has stopped doing the exercises and lost the benefit.   -Admit to [place], attending Dr. Shon Hough -Consult to [place] to Nephrology -Vitals per unit -Continuous cardiac telemetry -Orthostatic Vitals -mIVF 100 cc/hr -C. Diff testing -Azithromycin x 1 -Ceftriaxone x 1 -UA -Protonix gtt 80mg  continued from ED. Can switch to 40mg  IV BID tomorrow   Dehydration in setting of diarrhea   Orthostasis Patient notably dehydrated on physical exam with poor PO intake over the last few days. Patient had Flu and pneumonia a few weeks ago, and was treated with antibiotics. According to son, he's had diarrhea for two weeks, with little relief from antidiarrheals. Patient may be suffering from C. Diff infection. -C. Diff test -mIVF -Orthostatic BP  Concern for Pneumonia   Concern for Sinusits  Imaging concerning for new bibasilar airspace disease concerning for  possible pneumonia. Patient had recent history of Pneumonia and Flu. When looking at prior CXR does not appear to have worsened. Given he does not have respiratory symptoms, has remained afebrile and with normal WBC  will not continue abx for now. Patient imaging also showing sinus disease though he is asymptomatic.  -S/p Azithromycin x1 & Ceftriaxone x1. Will hold for now -continue to monitor fever curve and respiratory status  Renal insufficiency Patient Cr was elevated to 5.89, with his baseline Cr being 0.9 in 12/22 -Consulted Nephrology, appreciate recommendations   Concern for Diverticulitis Patient imaging showed diverticulitis in descending colon, patient not complaining of any sick symptoms or abdominal pain.  -Consider cipro and flagyl in am  Anemia   FOBT + Patient hgb 9.5 on admission with + FOBT. Patient last Hgb was 12.6 in December. History of colon cancer noted on ED note but could not find on chart review.  -GI consulted, will see 1/28 -Protonix gtt -Hold ASA, Naproxen -Repeat CBC in am  Sacral wound   Rt Grt toenail Patient has sacral pressure ulcer that is stage 1-2, with skin break down, erythematous base, and blistering. Patient also has toenail partially removed on right great toe, of right great foot. Pictures in media tab -Consult to wound care  Thrombocytopenia PLT 144 on admission, previously 202 in 12/22 -Continue to monitor - am CBC  Hyponatremia Na 130 on admission. Previously 135 in 12/22 - mIVF -am BMP  Irregular EKG Patient with abnormal EKG, showing irregular rhthym with absence of p-waves and wide QRS complex. -Continue to monitor -Continue tele  Memory concern Son of patient noticed that patient has had short term memory issues since his Flu/Pneumonia sickness. Son of patient notes that his long term memory is fine, but short term is lacking. Patient is alert and oriented x 3 on exam -continue to monitor  Pulmonary nodule 6 mm pulmonary nodule in LLL noted on CT abdomen -Recommended non-contrast chest CT at 6-12 months is recommended. -If the nodule is stable at time of repeat CT, then future CT at 18-24   FEN/GI: NPO Prophylaxis:  SCDs  Disposition: Admit to FPTS  History of Present Illness:  Harry Norris is a 86 y.o. male presenting with weakness and fall  Review Of Systems:   States he fell after standing, tripped on something and lost his balance. Denies loss of consciousness. Denies feeling dizzy. Does not believe he was down for long, thinks someone helped him. States last fell about 3 weeks ago  States he had pneumonia and flu a few weeks ago. Not sure what he took for it. Does endorse it clearing up. Denies feeling ill or sick in the last few days. Does endorse diarrhea for the past 3 or 4 days. Unsure of how many Bms he has had daily but does state multiple. Denies blood in diarrhea. Does not recall diarrhea today, last had it yesterday. States he has been drinking mostly pepsi. Last had something to drink last night. Denies issues with urinating, though he states he did not urinate today, last urination yesterday. Denies CP, abdominal pain, headaches, changes in vision  Per son, states patient fell yesterday in the kitchen. Endorses history of orthostatic hypotension which causes these episodes. States he has been dehydrated which causes him to pass out. Second son was in the kitchen when patient fell, but unsure if he hit is head, states if he did it wasn't very hard. Patient also fell yesterday and hit his nose.  Son endorses diarrhea for the past couple of weeks. States in the past 2 months has been more forgetful with regards to short term things. Previously had a caretaker and was improving from functional status but has stopped doing his exercises, and lost his progress.  Denies alcohol use, tobacco use, recreational drugs    Patient Active Problem List   Diagnosis Date Noted   Pressure injury of skin 07/30/2021   Respiratory failure ([place]) 07/29/2021   Influenza 07/29/2021   Bradycardia 12/19/2018   Orthostatic hypotension 12/19/2018   Fatigue 12/12/2018   Hyperlipidemia 02/07/2018   Asthma  02/07/2018   Chronic rhinitis 02/26/2016   Cardiomyopathy ([place]) 12/18/2015   Chronic venous insufficiency 12/18/2015   LBBB (left bundle branch block) 12/18/2015   Syncope 12/18/2015   Hypoxemia 04/10/2014   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   Hypertensive heart disease    Prostate cancer ([place])    AAA (abdominal aortic aneurysm)    Carotid artery occlusion    DM type 2 (diabetes mellitus, type 2) ([place])    Leg swelling 11/28/2013   Other symptoms involving cardiovascular system 11/28/2013   Varicose veins of lower extremities with other complications 08/23/8249    Past Medical History: Past Medical History:  Diagnosis Date   AAA (abdominal aortic aneurysm)    Anemia    Asthma 02/07/2018   Benign neoplasm of colon    Bradycardia 12/19/2018   Bronchitis    Cancer ([place])    prostate   Cardiomyopathy ([place]) 12/18/2015   Carotid artery occlusion    Chronic rhinitis 02/26/2016   Chronic venous insufficiency 12/18/2015   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   PFTs 01/18/14:  FeV1 1.58 45% FVC 2.0 50% FeV1/FVC 79% Fef 25-75 795 ( 40% improved after BD) CXR: Copd changes ONO 03/07/14 desats to <88% on RA    Diverticulosis of colon (without mention of hemorrhage)    DM type 2 (diabetes mellitus, type 2) ([place])    Fatigue 12/12/2018   Hyperlipidemia    Hypertension    Hypertensive heart disease    Hypoxemia 04/10/2014   Overview:  Last Assessment & Plan:  Cont nocturnal oxygen therapy 2L    Kidney stone    LBBB (left bundle branch block) 12/18/2015   Leg swelling 11/28/2013   Orthostatic hypotension 12/19/2018   Other primary cardiomyopathies    Other symptoms involving cardiovascular system 11/28/2013   Prostate cancer ([place])    prostate    Syncope 12/18/2015   Unspecified venous (peripheral) insufficiency    Varicose veins    Varicose veins of lower extremities with other complications 0/37/0488    Past Surgical History: Past Surgical History:  Procedure Laterality Date     cardiac catheterization  may 2011   CATARACT EXTRACTION Bilateral    ENDOVENOUS ABLATION SAPHENOUS VEIN W/ LASER Right 03-29-2014   EVLA right greater saphenous vein     gold seed implants  2011   treatment of prostate cancer   HERNIA REPAIR Bilateral    LITHOTRIPSY  2010   right ear surgery      Social History: Social History   Tobacco Use   Smoking status: Former    Packs/day: 2.50    Years: 40.00    Pack years: 100.00    Types: Cigarettes    Quit date: 09/01/1999    Years since quitting: 22.0   Smokeless tobacco: Never  Vaping Use   Vaping Use: Never used  Substance Use Topics   Alcohol use:  No   Drug use: No    Please also refer to relevant sections of EMR.  Family History: Family History  Problem Relation Age of Onset   Cancer - Colon Mother        deceased   Hypertension Mother    Diabetes Mother    Emphysema Sister        deceased   Heart disease Sister    Asthma Sister    Kidney failure Father    Heart failure Father      Allergies and Medications: Allergies  Allergen Reactions   Ace Inhibitors Cough   No current facility-administered medications on file prior to encounter.   Current Outpatient Medications on File Prior to Encounter  Medication Sig Dispense Refill   albuterol (VENTOLIN HFA) 108 (90 Base) MCG/ACT inhaler Inhale 2 puffs into the lungs every 6 (six) hours as needed for wheezing or shortness of breath.     arformoterol (BROVANA) 15 MCG/2ML NEBU Take 2 mLs (15 mcg total) by nebulization 2 (two) times daily. (Patient not taking: Reported on 08/07/2021) 120 mL 1   aspirin EC 81 MG tablet Take 81 mg by mouth daily. Swallow whole.     atorvastatin (LIPITOR) 40 MG tablet Take 40 mg by mouth at bedtime.     budesonide (PULMICORT) 180 MCG/ACT inhaler Inhale 2 puffs into the lungs 2 (two) times daily. (Patient not taking: Reported on 07/29/2021)     carvedilol (COREG) 12.5 MG tablet Take 12.5 mg by mouth in the morning and at bedtime.      fenofibrate 160 MG tablet Take 160 mg by mouth daily.     furosemide (LASIX) 40 MG tablet Take 40 mg by mouth in the morning.     Multiple Vitamins-Minerals (CENTRUM SILVER 50+MEN) TABS Take 1 tablet by mouth daily with breakfast.     naproxen (NAPROSYN) 500 MG tablet Take 500 mg by mouth daily as needed (for pain).     Omega-3 Fatty Acids (FISH OIL) 1000 MG CAPS Take 1,000 mg by mouth 2 (two) times daily.     OXYGEN Inhale 2 L/min into the lungs as needed (for shortness of breath).     oxymetazoline (AFRIN) 0.05 % nasal spray Place 1 spray into both nostrils 2 (two) times daily as needed for congestion.     revefenacin (YUPELRI) 175 MCG/3ML nebulizer solution Take 3 mLs (175 mcg total) by nebulization daily. (Patient not taking: Reported on 08/07/2021) 90 mL 1   vitamin C (ASCORBIC ACID) 500 MG tablet Take 500 mg by mouth daily.     vitamin E 400 UNIT capsule Take 400 Units by mouth daily.      Objective: BP (!) 135/47    Pulse 74    Resp (!) 25    SpO2 98%  Exam: General: Dry, tired, fatigued, uncomfortable, NAD, white male Eyes: EOMI grossly  ENTM: Dry mucous membranes Neck: Soft Cardiovascular: Irregular rhythm, NRMG Respiratory: CTABL Gastrointestinal: Soft, mildly distended, non TTP MSK: Moving all extremities independently, no edema Neuro: Alert and oriented x 3 Psych: Good mood and affect Sacrum: Stage 1-2 pressure wound with blister  Labs and Imaging: CBC BMET  Recent Labs  Lab 09/26/21 1125 09/26/21 1147  WBC 7.7  --   HGB 9.2* 9.5*  HCT 27.7* 28.0*  PLT 141*  --    Recent Labs  Lab 09/26/21 1125 09/26/21 1147  NA 130* 130*  K 3.8 3.9  CL 96*  --   CO2 17*  --  BUN 86*  --   CREATININE 5.89*  --   GLUCOSE 93  --   CALCIUM 8.0*  --      EKG: My own interpretation (not copied from electronic read).   Patient with abnormal EKG, showing irregular rhthym with absence of p-waves and wide QRS complex.    Holley Bouche, MD 09/26/2021, 3:14 PM PGY-1, [place] Intern pager: [phone], text pages welcome   FPTS Upper-Level Resident Addendum   I have independently interviewed and examined the patient. I have discussed the above with the original author and agree with their documentation. My edits for correction/addition/clarification are included. Please see also any attending notes.   Sonia Side, D.O. PGY-2, [facility] 09/26/2021 8:25 PM  [place] Service pager: [phone] (text pages welcome through [facility]

## 2021-09-26 NOTE — ED Notes (Signed)
Son advising PCP stopped losartan on Tuesday completely and changed carvedilol yesterday to 6.25 mg morning and night.

## 2021-09-26 NOTE — Consult Note (Signed)
[facility] Renal Consultation Note  Requesting MD: Pearline Cables Indication for Consultation: AKI in the last 79 days  HPI:  Harry Norris is a 86 y.o. male with past medical history significant for HTN, DM, cardiomyopathy-  EF 35%, history of a AAA and prostate and colon cancer, COPD-  he is s/p a hospitalization for respiratory failure from 07/29/21-08/03/21 where he required intubation-  felt due to COPD plus flu.  His renal function was good during that hospitalization -  crt was 0.9 at the time of discharge on 08/03/21-  discharged on lasix 40 daily but there was mention of resuming his home meds most notably of losartan but not officially done that I can see.  He presents to the ER today with complaint of weakness and falls-  BP low 100's-  given fluid-  labs show crt of 5.89 so I am asked to see-  imaging does not reveal urinary obstruction.  Son was present at bedside- know his meds-  confirms that in fact he was taking losartan.  Of late, having diarrhea and not eating well-  thinks the "fall"  today may have been passing out.  He is very weak -  he denies change in UOP or any urinary sxms   Creatinine, Ser  Date/Time Value Ref Range Status  09/26/2021 11:25 AM 5.89 (H) 0.61 - 1.24 mg/dL Final  08/03/2021 07:12 AM 0.90 0.61 - 1.24 mg/dL Final  08/02/2021 12:32 AM 0.84 0.61 - 1.24 mg/dL Final  08/01/2021 01:27 PM 1.01 0.61 - 1.24 mg/dL Final  08/01/2021 03:45 AM 0.95 0.61 - 1.24 mg/dL Final  07/31/2021 01:53 AM 1.22 0.61 - 1.24 mg/dL Final  07/30/2021 06:05 AM 1.59 (H) 0.61 - 1.24 mg/dL Final  07/29/2021 07:42 AM 1.20 0.61 - 1.24 mg/dL Final  06/06/2021 08:30 AM 1.36 (H) 0.61 - 1.24 mg/dL Final     PMHx:   Past Medical History:  Diagnosis Date   AAA (abdominal aortic aneurysm)    Anemia    Asthma 02/07/2018   Benign neoplasm of colon    Bradycardia 12/19/2018   Bronchitis    Cancer ([place])    prostate   Cardiomyopathy ([place]) 12/18/2015   Carotid artery occlusion     Chronic rhinitis 02/26/2016   Chronic venous insufficiency 12/18/2015   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   PFTs 01/18/14:  FeV1 1.58 45% FVC 2.0 50% FeV1/FVC 79% Fef 25-75 795 ( 40% improved after BD) CXR: Copd changes ONO 03/07/14 desats to <88% on RA    Diverticulosis of colon (without mention of hemorrhage)    DM type 2 (diabetes mellitus, type 2) ([place])    Fatigue 12/12/2018   Hyperlipidemia    Hypertension    Hypertensive heart disease    Hypoxemia 04/10/2014   Overview:  Last Assessment & Plan:  Cont nocturnal oxygen therapy 2L    Kidney stone    LBBB (left bundle branch block) 12/18/2015   Leg swelling 11/28/2013   Orthostatic hypotension 12/19/2018   Other primary cardiomyopathies    Other symptoms involving cardiovascular system 11/28/2013   Prostate cancer ([place])    prostate    Syncope 12/18/2015   Unspecified venous (peripheral) insufficiency    Varicose veins    Varicose veins of lower extremities with other complications 2/44/0102    Past Surgical History:  Procedure Laterality Date    cardiac catheterization  may 2011   CATARACT EXTRACTION Bilateral    ENDOVENOUS ABLATION SAPHENOUS VEIN W/ LASER Right 03-29-2014  EVLA right greater saphenous vein     gold seed implants  2011   treatment of prostate cancer   HERNIA REPAIR Bilateral    LITHOTRIPSY  2010   right ear surgery      Family Hx:  Family History  Problem Relation Age of Onset   Cancer - Colon Mother        deceased   Hypertension Mother    Diabetes Mother    Emphysema Sister        deceased   Heart disease Sister    Asthma Sister    Kidney failure Father    Heart failure Father     Social History:  reports that he quit smoking about 22 years ago. His smoking use included cigarettes. He has a 100.00 pack-year smoking history. He has never used smokeless tobacco. He reports that he does not drink alcohol and does not use drugs.  Allergies:  Allergies  Allergen Reactions   Ace Inhibitors Cough     Medications: Prior to Admission medications   Medication Sig Start Date End Date Taking? Authorizing Provider  albuterol (VENTOLIN HFA) 108 (90 Base) MCG/ACT inhaler Inhale 2 puffs into the lungs every 6 (six) hours as needed for wheezing or shortness of breath.    [provider]  arformoterol (BROVANA) 15 MCG/2ML NEBU Take 2 mLs (15 mcg total) by nebulization 2 (two) times daily. Patient not taking: Reported on 08/07/2021 08/03/21   Little Ishikawa, MD  aspirin EC 81 MG tablet Take 81 mg by mouth daily. Swallow whole.    [provider]  atorvastatin (LIPITOR) 40 MG tablet Take 40 mg by mouth at bedtime.    [provider]  budesonide (PULMICORT) 180 MCG/ACT inhaler Inhale 2 puffs into the lungs 2 (two) times daily. Patient not taking: Reported on 07/29/2021    [provider]  carvedilol (COREG) 12.5 MG tablet Take 12.5 mg by mouth in the morning and at bedtime.    [provider]  fenofibrate 160 MG tablet Take 160 mg by mouth daily.    [provider]  furosemide (LASIX) 40 MG tablet Take 40 mg by mouth in the morning.    [provider]  Multiple Vitamins-Minerals (CENTRUM SILVER 50+MEN) TABS Take 1 tablet by mouth daily with breakfast.    [provider]  naproxen (NAPROSYN) 500 MG tablet Take 500 mg by mouth daily as needed (for pain).    [provider]  Omega-3 Fatty Acids (FISH OIL) 1000 MG CAPS Take 1,000 mg by mouth 2 (two) times daily.    [provider]  OXYGEN Inhale 2 L/min into the lungs as needed (for shortness of breath).    [provider]  oxymetazoline (AFRIN) 0.05 % nasal spray Place 1 spray into both nostrils 2 (two) times daily as needed for congestion.    [provider]  revefenacin (YUPELRI) 175 MCG/3ML nebulizer solution Take 3 mLs (175 mcg total) by nebulization daily. Patient not taking: Reported on 08/07/2021 08/04/21   Little Ishikawa, MD   vitamin C (ASCORBIC ACID) 500 MG tablet Take 500 mg by mouth daily.    [provider]  vitamin E 400 UNIT capsule Take 400 Units by mouth daily.    [provider]    I have reviewed the patient's current medications.  Labs:  Results for orders placed or performed during the hospital encounter of 09/26/21 (from the past 48 hour(s))  CBC with Differential  Status: Abnormal   Collection Time: 09/26/21 11:25 AM  Result Value Ref Range   WBC 7.7 4.0 - 10.5 K/uL   RBC 3.12 (L) 4.22 - 5.81 MIL/uL   Hemoglobin 9.2 (L) 13.0 - 17.0 g/dL   HCT 27.7 (L) 39.0 - 52.0 %   MCV 88.8 80.0 - 100.0 fL   MCH 29.5 26.0 - 34.0 pg   MCHC 33.2 30.0 - 36.0 g/dL   RDW 16.8 (H) 11.5 - 15.5 %   Platelets 141 (L) 150 - 400 K/uL   nRBC 0.0 0.0 - 0.2 %   Neutrophils Relative % 65 %   Neutro Abs 5.0 1.7 - 7.7 K/uL   Lymphocytes Relative 23 %   Lymphs Abs 1.7 0.7 - 4.0 K/uL   Monocytes Relative 8 %   Monocytes Absolute 0.6 0.1 - 1.0 K/uL   Eosinophils Relative 3 %   Eosinophils Absolute 0.2 0.0 - 0.5 K/uL   Basophils Relative 0 %   Basophils Absolute 0.0 0.0 - 0.1 K/uL   Immature Granulocytes 1 %   Abs Immature Granulocytes 0.08 (H) 0.00 - 0.07 K/uL    Comment: Performed at [facility], [address], [place], [place] [address]  Comprehensive metabolic panel     Status: Abnormal   Collection Time: 09/26/21 11:25 AM  Result Value Ref Range   Sodium 130 (L) 135 - 145 mmol/L   Potassium 3.8 3.5 - 5.1 mmol/L   Chloride 96 (L) 98 - 111 mmol/L   CO2 17 (L) 22 - 32 mmol/L   Glucose, Bld 93 70 - 99 mg/dL    Comment: Glucose reference range applies only to samples taken after fasting for at least 8 hours.   BUN 86 (H) 8 - 23 mg/dL   Creatinine, Ser 5.89 (H) 0.61 - 1.24 mg/dL   Calcium 8.0 (L) 8.9 - 10.3 mg/dL   Total Protein 5.5 (L) 6.5 - 8.1 g/dL   Albumin 2.6 (L) 3.5 - 5.0 g/dL   AST 33 15 - 41 U/L   ALT 46 (H) 0 - 44 U/L   Alkaline Phosphatase 38 38 - 126 U/L   Total  Bilirubin 0.7 0.3 - 1.2 mg/dL   GFR, Estimated 9 (L) >60 mL/min    Comment: (NOTE) Calculated using the CKD-EPI Creatinine Equation (2021)    Anion gap 17 (H) 5 - 15    Comment: Performed at [facility], [place] [address] [place], [place] [address]  Troponin I (High Sensitivity)     Status: Abnormal   Collection Time: 09/26/21 11:25 AM  Result Value Ref Range   Troponin I (High Sensitivity) 18 (H) <18 ng/L    Comment: (NOTE) Elevated high sensitivity troponin I (hsTnI) values and significant  changes across serial measurements may suggest ACS but many other  chronic and acute conditions are known to elevate hsTnI results.  Refer to the "Links" section for chest pain algorithms and additional  guidance. Performed at [facility], [place] [address] [place], [place] [address]   Lipase, blood     Status: None   Collection Time: 09/26/21 11:25 AM  Result Value Ref Range   Lipase 49 11 - 51 U/L    Comment: Performed at [facility] [address] [place], [place] [address]  Brain natriuretic peptide     Status: Abnormal   Collection Time: 09/26/21 11:25 AM  Result Value Ref Range   B Natriuretic Peptide 118.8 (H) 0.0 - 100.0 pg/mL  Comment: Performed at [facility], [place] [address], [place], [place] [address]  Resp Panel by RT-PCR (Flu A&B, Covid) Nasopharyngeal Swab     Status: None   Collection Time: 09/26/21 11:28 AM   Specimen: Nasopharyngeal Swab; Nasopharyngeal(NP) swabs in vial transport medium  Result Value Ref Range   SARS Coronavirus 2 by RT PCR NEGATIVE NEGATIVE    Comment: (NOTE) SARS-CoV-2 target nucleic acids are NOT DETECTED.  The SARS-CoV-2 RNA is generally detectable in upper respiratory specimens during the acute phase of infection. The lowest concentration of SARS-CoV-2 viral copies this assay can detect is 138 copies/mL. A negative result does not preclude SARS-Cov-2 infection and should not be used as the sole basis for treatment  or other patient management decisions. A negative result may occur with  improper specimen collection/handling, submission of specimen other than nasopharyngeal swab, presence of viral mutation(s) within the areas targeted by this assay, and inadequate number of viral copies(<138 copies/mL). A negative result must be combined with clinical observations, patient history, and epidemiological information. The expected result is Negative.  Fact Sheet for Patients:  EntrepreneurPulse.com.au  Fact Sheet for Healthcare Providers:  IncredibleEmployment.be  This test is no t yet approved or cleared by the [place] FDA and  has been authorized for detection and/or diagnosis of SARS-CoV-2 by FDA under an Emergency Use Authorization (EUA). This EUA will remain  in effect (meaning this test can be used) for the duration of the COVID-19 declaration under Section 564(b)(1) of the Act, 21 U.S.C.section 360bbb-3(b)(1), unless the authorization is terminated  or revoked sooner.       Influenza A by PCR NEGATIVE NEGATIVE   Influenza B by PCR NEGATIVE NEGATIVE    Comment: (NOTE) The Xpert Xpress SARS-CoV-2/FLU/RSV plus assay is intended as an aid in the diagnosis of influenza from Nasopharyngeal swab specimens and should not be used as a sole basis for treatment. Nasal washings and aspirates are unacceptable for Xpert Xpress SARS-CoV-2/FLU/RSV testing.  Fact Sheet for Patients: EntrepreneurPulse.com.au  Fact Sheet for Healthcare Providers: IncredibleEmployment.be  This test is not yet approved or cleared by the [place] FDA and has been authorized for detection and/or diagnosis of SARS-CoV-2 by FDA under an Emergency Use Authorization (EUA). This EUA will remain in effect (meaning this test can be used) for the duration of the COVID-19 declaration under Section 564(b)(1) of the Act, 21 U.S.C. section  360bbb-3(b)(1), unless the authorization is terminated or revoked.  Performed at [facility], [place] [address] [place], [place] [address]   I-Stat venous blood gas, [facility] ED)     Status: Abnormal   Collection Time: 09/26/21 11:47 AM  Result Value Ref Range   pH, Ven 7.342 7.250 - 7.430   pCO2, Ven 42.1 (L) 44.0 - 60.0 mmHg   pO2, Ven 146.0 (H) 32.0 - 45.0 mmHg   Bicarbonate 22.8 20.0 - 28.0 mmol/L   TCO2 24 22 - 32 mmol/L   O2 Saturation 99.0 %   Acid-base deficit 3.0 (H) 0.0 - 2.0 mmol/L   Sodium 130 (L) 135 - 145 mmol/L   Potassium 3.9 3.5 - 5.1 mmol/L   Calcium, Ion 1.03 (L) 1.15 - 1.40 mmol/L   HCT 28.0 (L) 39.0 - 52.0 %   Hemoglobin 9.5 (L) 13.0 - 17.0 g/dL   Sample type VENOUS   Troponin I (High Sensitivity)     Status: Abnormal   Collection Time: 09/26/21  2:15 PM  Result Value Ref Range   Troponin I (  High Sensitivity) 21 (H) <18 ng/L    Comment: (NOTE) Elevated high sensitivity troponin I (hsTnI) values and significant  changes across serial measurements may suggest ACS but many other  chronic and acute conditions are known to elevate hsTnI results.  Refer to the "Links" section for chest pain algorithms and additional  guidance. Performed at [facility], [place] [address] [place], [place] [address]   POC occult blood, ED     Status: Abnormal   Collection Time: 09/26/21  3:08 PM  Result Value Ref Range   Fecal Occult Bld POSITIVE (A) NEGATIVE     ROS:  Pertinent items noted in HPI and remainder of comprehensive ROS otherwise negative.  Physical Exam: Vitals:   09/26/21 1545 09/26/21 1600  BP: (!) 136/116 (!) 111/44  Pulse: 73 73  Resp: 19 20  SpO2: 96% 96%     General: pale, weak , elderly WM-  looks confused but does confirm he was on losartan-  son at bedside HEENT: PERRLA, EOMI, mucous membranes moist  Neck: no JVD Heart: RRR Lungs: anteriorly clear Abdomen: soft, non tender Extremities: no edema-  right toenail was ripped off, is  bleeding  Skin: warm and dry Neuro: alert, overall weak but not focal   Assessment/Plan: 86 year old WM multiple medical issues -  normal renal function 50 days ago-  now presents with AKI in the setting of low BP on losartan 1.Renal- AKI over the last 50 days in the setting of likely volume depletion with diarrhea and hypotension on losartan.  Imaging does not show obstruction.  Has received IVF - BP is reasonable at present- holding all antihypertensives and will use fluid PRN.  Will check a U/A for completeness sake-  check daily labs.   No acute indications for RRT-  hopeful that this will turn around given normal renal function not that long ago  2. Hypertension/volume  - no edema-  hypotensive-  getting fluid repleted  3. ID-  imaging showing PNA-  started on rocephin and azithro per primary team  4. Anemia  - hgb has dropped from 12/4 as well from 12.6 to 9.2-  heme positive -  diarrhea- supportive care per primary team   Louis Meckel 09/26/2021, 4:29 PM

## 2021-09-26 NOTE — Progress Notes (Signed)
New Admission Note:  Arrival Method: Mental Orientation: Alert & Oriented x 4 Telemetry: CCMD verified. [id number] Assessment: Completed Skin: Refer to flowsheet IV: Right & Left AC Pain: 3/10 Safety Measures: Safety Fall Prevention Plan discussed with patient. Admission: Completed [facility] [place] Orientation: Patient has been orientated to the room, unit and the staff.  Orders have been reviewed and are being implemented. Will continue to monitor the patient. Call light has been placed within reach and bed alarm has been activated.   Vassie Moselle, RN  Phone Number: [phone]

## 2021-09-26 NOTE — ED Triage Notes (Signed)
Patient BIB [facility] from home, compliant of weakness for several days, fall today, hypotensive upon EMS arrival, BP in low 100s following 600cc fluids.

## 2021-09-26 NOTE — ED Notes (Signed)
1st lactic acid normal. 2nd to be d/c.

## 2021-09-27 DIAGNOSIS — K5792 Diverticulitis of intestine, part unspecified, without perforation or abscess without bleeding: Secondary | ICD-10-CM

## 2021-09-27 DIAGNOSIS — K921 Melena: Secondary | ICD-10-CM

## 2021-09-27 DIAGNOSIS — N289 Disorder of kidney and ureter, unspecified: Secondary | ICD-10-CM

## 2021-09-27 DIAGNOSIS — R197 Diarrhea, unspecified: Secondary | ICD-10-CM

## 2021-09-27 LAB — RENAL FUNCTION PANEL
Albumin: 2.3 g/dL — ABNORMAL LOW (ref 3.5–5.0)
Anion gap: 17 — ABNORMAL HIGH (ref 5–15)
BUN: 80 mg/dL — ABNORMAL HIGH (ref 8–23)
CO2: 16 mmol/L — ABNORMAL LOW (ref 22–32)
Calcium: 7.7 mg/dL — ABNORMAL LOW (ref 8.9–10.3)
Chloride: 104 mmol/L (ref 98–111)
Creatinine, Ser: 5.06 mg/dL — ABNORMAL HIGH (ref 0.61–1.24)
GFR, Estimated: 11 mL/min — ABNORMAL LOW (ref >60)
Glucose, Bld: 66 mg/dL — ABNORMAL LOW (ref 70–99)
Phosphorus: 5 mg/dL — ABNORMAL HIGH (ref 2.5–4.6)
Potassium: 3.7 mmol/L (ref 3.5–5.1)
Sodium: 137 mmol/L (ref 135–145)

## 2021-09-27 LAB — CBC
HCT: 27.8 % — ABNORMAL LOW (ref 39.0–52.0)
Hemoglobin: 9.1 g/dL — ABNORMAL LOW (ref 13.0–17.0)
MCH: 29.1 pg (ref 26.0–34.0)
MCHC: 32.7 g/dL (ref 30.0–36.0)
MCV: 88.8 fL (ref 80.0–100.0)
Platelets: 143 10*3/uL — ABNORMAL LOW (ref 150–400)
RBC: 3.13 MIL/uL — ABNORMAL LOW (ref 4.22–5.81)
RDW: 16.8 % — ABNORMAL HIGH (ref 11.5–15.5)
WBC: 6.9 10*3/uL (ref 4.0–10.5)
nRBC: 0 % (ref 0.0–0.2)

## 2021-09-27 LAB — COMPREHENSIVE METABOLIC PANEL
ALT: 37 U/L (ref 0–44)
AST: 27 U/L (ref 15–41)
Albumin: 2.3 g/dL — ABNORMAL LOW (ref 3.5–5.0)
Alkaline Phosphatase: 35 U/L — ABNORMAL LOW (ref 38–126)
Anion gap: 18 — ABNORMAL HIGH (ref 5–15)
BUN: 81 mg/dL — ABNORMAL HIGH (ref 8–23)
CO2: 15 mmol/L — ABNORMAL LOW (ref 22–32)
Calcium: 7.8 mg/dL — ABNORMAL LOW (ref 8.9–10.3)
Chloride: 103 mmol/L (ref 98–111)
Creatinine, Ser: 5.04 mg/dL — ABNORMAL HIGH (ref 0.61–1.24)
GFR, Estimated: 11 mL/min — ABNORMAL LOW (ref >60)
Glucose, Bld: 66 mg/dL — ABNORMAL LOW (ref 70–99)
Potassium: 3.7 mmol/L (ref 3.5–5.1)
Sodium: 136 mmol/L (ref 135–145)
Total Bilirubin: 0.5 mg/dL (ref 0.3–1.2)
Total Protein: 5 g/dL — ABNORMAL LOW (ref 6.5–8.1)

## 2021-09-27 LAB — URINALYSIS, ROUTINE W REFLEX MICROSCOPIC
Bilirubin Urine: NEGATIVE
Glucose, UA: NEGATIVE mg/dL
Hgb urine dipstick: NEGATIVE
Ketones, ur: NEGATIVE mg/dL
Leukocytes,Ua: NEGATIVE
Nitrite: NEGATIVE
Protein, ur: NEGATIVE mg/dL
Specific Gravity, Urine: 1.009 (ref 1.005–1.030)
pH: 5 (ref 5.0–8.0)

## 2021-09-27 LAB — FOLATE: Folate: 19.3 ng/mL (ref >5.9)

## 2021-09-27 LAB — GLUCOSE, CAPILLARY
Glucose-Capillary: 59 mg/dL — ABNORMAL LOW (ref 70–99)
Glucose-Capillary: 138 mg/dL — ABNORMAL HIGH (ref 70–99)
Glucose-Capillary: 101 mg/dL — ABNORMAL HIGH (ref 70–99)
Glucose-Capillary: 108 mg/dL — ABNORMAL HIGH (ref 70–99)
Glucose-Capillary: 124 mg/dL — ABNORMAL HIGH (ref 70–99)

## 2021-09-27 LAB — IRON AND TIBC
Iron: 62 ug/dL (ref 45–182)
Saturation Ratios: 23 % (ref 17.9–39.5)
TIBC: 270 ug/dL (ref 250–450)
UIBC: 208 ug/dL

## 2021-09-27 LAB — VITAMIN B12: Vitamin B-12: 587 pg/mL (ref 180–914)

## 2021-09-27 LAB — HEMOGLOBIN A1C
Hgb A1c MFr Bld: 5.8 % — ABNORMAL HIGH (ref 4.8–5.6)
Mean Plasma Glucose: 119.76 mg/dL

## 2021-09-27 LAB — RETICULOCYTES
Immature Retic Fract: 14.5 % (ref 2.3–15.9)
RBC.: 3.09 MIL/uL — ABNORMAL LOW (ref 4.22–5.81)
Retic Count, Absolute: 34 10*3/uL (ref 19.0–186.0)
Retic Ct Pct: 1.1 % (ref 0.4–3.1)

## 2021-09-27 LAB — FERRITIN: Ferritin: 650 ng/mL — ABNORMAL HIGH (ref 24–336)

## 2021-09-27 MED ORDER — PIPERACILLIN-TAZOBACTAM IN DEX 2-0.25 GM/50ML IV SOLN
2.2500 g | Freq: Three times a day (TID) | INTRAVENOUS | Status: DC
  Administered 2021-09-28 (×2): 2.25 g via INTRAVENOUS
  Filled 2021-09-27 (×3): qty 50

## 2021-09-27 MED ORDER — POTASSIUM CHLORIDE CRYS ER 20 MEQ PO TBCR
40.0000 meq | EXTENDED_RELEASE_TABLET | Freq: Once | ORAL | Status: AC
  Administered 2021-09-27: 40 meq via ORAL
  Filled 2021-09-27: qty 2

## 2021-09-27 MED ORDER — NAPHAZOLINE-GLYCERIN 0.012-0.2 % OP SOLN
1.0000 [drp] | Freq: Four times a day (QID) | OPHTHALMIC | Status: DC | PRN
  Administered 2021-09-27: 2 [drp] via OPHTHALMIC
  Administered 2021-09-28 (×3): 1 [drp] via OPHTHALMIC
  Filled 2021-09-27: qty 15

## 2021-09-27 MED ORDER — DEXTROSE 50 % IV SOLN
INTRAVENOUS | Status: AC
  Administered 2021-09-27: 25 mL
  Filled 2021-09-27: qty 50

## 2021-09-27 MED ORDER — ACETAMINOPHEN 325 MG PO TABS
650.0000 mg | ORAL_TABLET | Freq: Four times a day (QID) | ORAL | Status: DC | PRN
  Administered 2021-09-27 – 2021-09-29 (×5): 650 mg via ORAL
  Filled 2021-09-27 (×4): qty 2

## 2021-09-27 MED ORDER — SODIUM BICARBONATE 8.4 % IV SOLN
INTRAVENOUS | Status: DC
  Filled 2021-09-27 (×2): qty 1000

## 2021-09-27 MED ORDER — PANTOPRAZOLE SODIUM 40 MG IV SOLR
40.0000 mg | Freq: Two times a day (BID) | INTRAVENOUS | Status: DC
  Administered 2021-09-27 – 2021-09-29 (×5): 40 mg via INTRAVENOUS
  Filled 2021-09-27 (×5): qty 40

## 2021-09-27 NOTE — Evaluation (Signed)
Physical Therapy Evaluation Patient Details Name: Harry Norris MRN: [id number] DOB: 1936-04-02 Today's Date: 09/27/2021  History of Present Illness  86 y.o. male presents to [facility] hospital on 09/26/2021 with weakness and multiple falls. Pt noted to be hypotensive and dehydrated on admission. PMH includes DM, COPD, cardiomyopathy EF 35%, HLD, HTN, HLD, prostate cancer.  Clinical Impression  Pt presents to PT with deficits in strength, power, activity tolerance, endurance. Pt is able to transfer and ambulate without physical assistance, however does report generalized weakness compared to baseline. Pt will benefit from aggressive mobilization to aide in improving activity tolerance and LE strength. PT recommends continued HHPT at the time of discharge.     Recommendations for follow up therapy are one component of a multi-disciplinary discharge planning process, led by the attending physician.  Recommendations may be updated based on patient status, additional functional criteria and insurance authorization.  Follow Up Recommendations Home health PT    Assistance Recommended at Discharge PRN  Patient can return home with the following  Help with stairs or ramp for entrance;Assistance with cooking/housework    Equipment Recommendations None recommended by PT (pt owns necessary DME)  Recommendations for Other Services       Functional Status Assessment Patient has had a recent decline in their functional status and demonstrates the ability to make significant improvements in function in a reasonable and predictable amount of time.     Precautions / Restrictions Precautions Precautions: Fall Precaution Comments: monitor BP Restrictions Weight Bearing Restrictions: No      Mobility  Bed Mobility                    Transfers Overall transfer level: Needs assistance Equipment used: Rolling walker (2 wheels) Transfers: Sit to/from Stand Sit to Stand: Min guard                 Ambulation/Gait Ambulation/Gait assistance: Supervision Gait Distance (Feet): 150 Feet Assistive device: Rolling walker (2 wheels) Gait Pattern/deviations: Step-through pattern Gait velocity: functional Gait velocity interpretation: 1.31 - 2.62 ft/sec, indicative of limited community ambulator   General Gait Details: pt with slowed step-through gait, increased turnk flexion. PT provides cues for improved upright posture with minimal correction from patient  Stairs            Wheelchair Mobility    Modified Rankin (Stroke Patients Only)       Balance Overall balance assessment: Needs assistance Sitting-balance support: No upper extremity supported, Feet supported Sitting balance-Leahy Scale: Good     Standing balance support: Single extremity supported, Bilateral upper extremity supported, Reliant on assistive device for balance Standing balance-Leahy Scale: Poor                               Pertinent Vitals/Pain Pain Assessment Pain Assessment: No/denies pain    Home Living Family/patient expects to be discharged to:: Private residence Living Arrangements: Alone Available Help at Discharge: Family;Friend(s);Available PRN/intermittently Type of Home: House Home Access: Stairs to enter Entrance Stairs-Rails: Can reach both Entrance Stairs-Number of Steps: 5   Home Layout: One level Home Equipment: Rollator (4 wheels);Cane - single point;Shower seat      Prior Function Prior Level of Function : Independent/Modified Independent;Driving             Mobility Comments: ambulates with use of rollator typically       Hand Dominance   Dominant Hand: Right    Extremity/Trunk  Assessment   Upper Extremity Assessment Upper Extremity Assessment: Generalized weakness    Lower Extremity Assessment Lower Extremity Assessment: Generalized weakness    Cervical / Trunk Assessment Cervical / Trunk Assessment: Kyphotic  Communication    Communication: HOH  Cognition Arousal/Alertness: Awake/alert Behavior During Therapy: WFL for tasks assessed/performed Overall Cognitive Status: Within Functional Limits for tasks assessed                                          General Comments General comments (skin integrity, edema, etc.): orthostatic vitals documented in vitals flowsheet. Pt denies symptoms of orthostatic BP    Exercises     Assessment/Plan    PT Assessment Patient needs continued PT services  PT Problem List Decreased strength;Decreased activity tolerance;Decreased balance;Decreased mobility       PT Treatment Interventions DME instruction;Gait training;Stair training;Functional mobility training;Therapeutic activities;Therapeutic exercise;Balance training;Neuromuscular re-education;Patient/family education    PT Goals (Current goals can be found in the Care Plan section)  Acute Rehab PT Goals Patient Stated Goal: to return to independence PT Goal Formulation: With patient Time For Goal Achievement: 10/11/21 Potential to Achieve Goals: Good    Frequency Min 3X/week     Co-evaluation               AM-PAC PT "6 Clicks" Mobility  Outcome Measure Help needed turning from your back to your side while in a flat bed without using bedrails?: A Little Help needed moving from lying on your back to sitting on the side of a flat bed without using bedrails?: A Little Help needed moving to and from a bed to a chair (including a wheelchair)?: A Little Help needed standing up from a chair using your arms (e.g., wheelchair or bedside chair)?: A Little Help needed to walk in hospital room?: A Little Help needed climbing 3-5 steps with a railing? : A Little 6 Click Score: 18    End of Session   Activity Tolerance: Patient tolerated treatment well Patient left: in chair;with call bell/phone within reach;with family/visitor present Nurse Communication: Mobility status PT Visit Diagnosis:  Muscle weakness (generalized) (M62.81)    Time: 4403-4742 PT Time Calculation (min) (ACUTE ONLY): 39 min   Charges:   PT Evaluation $PT Eval Low Complexity: [place], PT, DPT Acute Rehabilitation Pager: [phone] Office [phone]   Zenaida Niece 09/27/2021, 4:40 PM

## 2021-09-27 NOTE — Progress Notes (Signed)
Family Medicine Teaching Service Daily Progress Note Intern Pager: [phone]  Patient name: Harry Norris Medical record number: [id number] Date of birth: [birth year] Age: 86 y.o. Gender: male  Primary Care Provider: [name], MD Consultants: GI Code Status: DNR  Pt Overview and Major Events to Date:  1/27- admitted, GI consulted  Assessment and Plan: AHMAUD DUTHIE is a 86 y.o. male presenting with weakness and multiple falls. PMH is significant for DM, COPD, cardiomyopathy EF 35%, HLD, HTN, HLD, prostate cancer, AAA,    Weakness   Fall, in the setting of anemia, dehydration and hypotension States that he continues to feel weak.  Today he would like to sit up on the side of the bed.  Plan to get orthostatic vital signs at this time.  We will also plan to get PT and OT on board for further strengthening assistance. -Trend CBC as below, GI consult - Continue maintenance IV fluids - Avoid antihypertensives at this time - Obtain orthostatic vital signs  Hypoglycemia, Hx of DM CBG 59 this morning likely due to being n.p.o.  We will give have D5 at this time and await GI recs and add diet. Cannot locate A1c in chart.  - CBG ac & hs - F/u A1c - D5 as needed consider just adding to fluids if long-term - Give diet when able   Dehydration in setting of diarrhea   Orthostasis Diarrhea over the last 2 weeks secondary to antibiotic use for pneumonia and flu. -Follow-up C. Diff test -mIVF -Orthostatic BP   Concern for Pneumonia   Concern for Sinusits  Known recent infection of the lung and flu.  Chest x-ray unchanged from last.  He is afebrile with normal white count.  Satting 95% on room air without increased work of breathing.  No antibiotics needed at this time.  He is status post azithromycin and ceftriaxone.  -continue to monitor fever curve and respiratory status   Renal insufficiency Cr 5.06 this morning.  Minimal improvement with IVF support. -Consulted Nephrology,  appreciate recommendations: likely AKI in setting of volume depletion due to BP medication and diarrhea -Continue maintenance IV fluids -Hold antihypertensive - Monitor on a.m. RFP - Monitor UOP   Concern for Diverticulitis Patient imaging showed diverticulitis in descending colon, patient not complaining of any sick symptoms or abdominal pain. States he is having regular normal bowel movements.  -Consider utility of Abx in this patient   Anemia   FOBT + Hgb 9.1 this morning. History of colon cancer noted on ED note but could not find on chart review.  -GI consulted, will see today. Appreciate recommmendations; give diet when appropriate -Protonix gtt -Hold ASA, Naproxen -Repeat CBC in am   Sacral wound   Rt Grt toenail Discussed with nursing staff that patient desires sitting on side of bed today to offload some pressure. Patient has sacral pressure ulcer that is stage 1-2, with skin break down, erythematous base, and blistering. Patient also has toenail partially removed on right great toe, of right great foot. Pictures in media tab -Consult to wound care   Thrombocytopenia PLT 143. No evidence of bleeding at this time.  -Continue to monitor - am CBC   Hyponatremia, Resolved.  136 this morning.  -am BMP   Irregular EKG On tele this morning, NSR w/ BBB.  -Continue to monitor -Continue tele   FEN/GI: NPO Prophylaxis: SCDs  Stable, chronic problems: Pulmonary nodule- non-contrast chest CT at 6-12 months is recommended [facility]  FEN/GI:  NPO PPx: SCDs Dispo:Home pending clinical improvement .  Subjective:  States that he would like to eat and sit on the side of the bed to offload the pain on his bottom.  Objective: Temp:  [97.5 F (36.4 C)-98.3 F (36.8 C)] 97.9 F (36.6 C) (01/28 0453) Pulse Rate:  [59-98] 88 (01/28 0453) Resp:  [16-27] 16 (01/28 0453) BP: (97-136)/(35-116) 106/84 (01/28 0453) SpO2:  [84 %-100 %] 93 % (01/28 0453) Weight:   [71.4 kg] 71.4 kg (01/28 0453) Physical Exam: General: Appears tired, no acute distress. Age appropriate. Cardiac: RRR, normal heart sounds, no murmurs Respiratory: CTAB, normal effort Abdomen: soft, nontender, nondistended Extremities: No edema or cyanosis. Skin: Warm and dry, no rashes noted Neuro: alert and oriented, no focal deficits Psych: normal affect  Laboratory: Recent Labs  Lab 09/26/21 1125 09/26/21 1147 09/27/21 0146  WBC 7.7  --  6.9  HGB 9.2* 9.5* 9.1*  HCT 27.7* 28.0* 27.8*  PLT 141*  --  143*   Recent Labs  Lab 09/26/21 1125 09/26/21 1147 09/27/21 0146  NA 130* 130* 136   137  K 3.8 3.9 3.7   3.7  CL 96*  --  103   104  CO2 17*  --  15*   16*  BUN 86*  --  81*   80*  CREATININE 5.89*  --  5.04*   5.06*  CALCIUM 8.0*  --  7.8*   7.7*  PROT 5.5*  --  5.0*  BILITOT 0.7  --  0.5  ALKPHOS 38  --  35*  ALT 46*  --  37  AST 33  --  27  GLUCOSE 93  --  66*   66*   Imaging/Diagnostic Tests: No new imaging.   Gerlene Fee, DO 09/27/2021, 7:09 AM PGY-3, [place] Intern pager: [phone], text pages welcome

## 2021-09-27 NOTE — Progress Notes (Signed)
Subjective:  no UOP recorded-  but crt seeming to be trending better -  he does not know if he has made any but says he feels better-  no more diarrhea Objective Vital signs in last 24 hours: Vitals:   09/26/21 2119 09/27/21 0113 09/27/21 0453 09/27/21 1000  BP: (!) 109/47 (!) 125/50 106/84 114/72  Pulse: 98 79 88 82  Resp: 20 18 16 20   Temp: 98.1 F (36.7 C) (!) 97.5 F (36.4 C) 97.9 F (36.6 C) 98 F (36.7 C)  TempSrc:  Oral  Oral  SpO2: (!) 84% 92% 93% 95%  Weight:   71.4 kg   Height:       Weight change:   Intake/Output Summary (Last 24 hours) at 09/27/2021 1049 Last data filed at 09/27/2021 0601 Gross per 24 hour  Intake 1964.88 ml  Output --  Net 1964.88 ml    Assessment/Plan: 86 year old WM multiple medical issues -  normal renal function 50 days ago-  now presents with AKI in the setting of low BP on losartan 1.Renal- AKI over the last 50 days in the setting of likely volume depletion with diarrhea and hypotension on losartan.  Imaging does not show obstruction.  Is on continuous  IVF - BP is reasonable at present- holding all antihypertensives.  no urine for review-  check daily labs.   No acute indications for RRT-  hopeful that this will turn around given normal renal function not that long ago -  so far trending better with IVF-  will change to bicarb based fluids with next bag given acidosis  2. Hypertension/volume  - no edema-  hypotensive-  getting fluid repleted -  continue  3. ID-  imaging showing PNA-  started on rocephin and azithro per primary team  4. Anemia  - hgb has dropped from 12/4 as well from 12.6 to 9.1-  heme positive -  diarrhea- supportive care for now per primary team 5. Hypokalemia-  since going to be giving bicarb suspect it will go down-  will give 40 PO today      Louis Meckel    Labs: Basic Metabolic Panel: Recent Labs  Lab 09/26/21 1125 09/26/21 1147 09/27/21 0146  NA 130* 130* 136   137  K 3.8 3.9 3.7   3.7  CL 96*  --   103   104  CO2 17*  --  15*   16*  GLUCOSE 93  --  66*   66*  BUN 86*  --  81*   80*  CREATININE 5.89*  --  5.04*   5.06*  CALCIUM 8.0*  --  7.8*   7.7*  PHOS  --   --  5.0*   Liver Function Tests: Recent Labs  Lab 09/26/21 1125 09/27/21 0146  AST 33 27  ALT 46* 37  ALKPHOS 38 35*  BILITOT 0.7 0.5  PROT 5.5* 5.0*  ALBUMIN 2.6* 2.3*   2.3*   Recent Labs  Lab 09/26/21 1125  LIPASE 49   No results for input(s): AMMONIA in the last 168 hours. CBC: Recent Labs  Lab 09/26/21 1125 09/26/21 1147 09/27/21 0146  WBC 7.7  --  6.9  NEUTROABS 5.0  --   --   HGB 9.2* 9.5* 9.1*  HCT 27.7* 28.0* 27.8*  MCV 88.8  --  88.8  PLT 141*  --  143*   Cardiac Enzymes: No results for input(s): CKTOTAL, CKMB, CKMBINDEX, TROPONINI in the last 168 hours. CBG: Recent  Labs  Lab 09/27/21 1022  GLUCAP 59*    Iron Studies: No results for input(s): IRON, TIBC, TRANSFERRIN, FERRITIN in the last 72 hours. Studies/Results: CT ABDOMEN PELVIS WO CONTRAST  Result Date: 09/26/2021 CLINICAL DATA:  Acute abdominal pain with fall. EXAM: CT ABDOMEN AND PELVIS WITHOUT CONTRAST TECHNIQUE: Multidetector CT imaging of the abdomen and pelvis was performed following the standard protocol without IV contrast. RADIATION DOSE REDUCTION: This exam was performed according to the departmental dose-optimization program which includes automated exposure control, adjustment of the mA and/or kV according to patient size and/or use of iterative reconstruction technique. COMPARISON:  Plain film 07/29/2021. CTA of the abdomen and pelvis of 06/06/2021. FINDINGS: Lower chest: Centrilobular emphysema. Bibasilar patchy airspace disease with areas of cylindrical bronchiectasis. No basilar pulmonary contusion or pneumothorax. 6 mm left lower lobe pulmonary nodule on 16/4 is likely new since the prior. Normal heart size without pericardial or pleural effusion. Lad coronary artery calcification. Hepatobiliary: Normal liver. Normal  gallbladder, without biliary ductal dilatation. Pancreas: Normal, without mass or ductal dilatation. Spleen: Normal in size, without focal abnormality. Adrenals/Urinary Tract: Normal adrenal glands. Bilateral renal cortical thinning. Within both kidneys, there are renal lesions of varying complexity. Maximally 2.7 cm in the interpolar right kidney which is most consistent with a minimally complex cyst. Other lesions measure greater than fluid density, including at 1.7 cm in the interpolar right kidney on 36/3, and scattered throughout the left kidney including at 1.8 cm in the interpolar region on 41/3. When comparing to the CTA of 06/06/2021, some demonstrate possible enhancement, including the above 1.7 cm interpolar right renal lesion. No hydronephrosis.  No bladder calculi. Stomach/Bowel: Tiny hiatal hernia. Extensive colonic diverticulosis. Edema is seen adjacent to the distal descending colon including on 49/3. Normal terminal ileum and appendix. Minimal small bowel extends into an area of ventral pelvic wall laxity on 60/3, similar. Vascular/Lymphatic: Advanced aortic and branch vessel atherosclerosis. Infrarenal abdominal aortic aneurysm of 3.5 cm is similar to on the prior exam. No surrounding hemorrhage. A retroaortic left renal vein. No abdominopelvic adenopathy. Reproductive: Radiation seeds in the prostate. Other: No significant free fluid. Small fat containing right inguinal hernia. No free intraperitoneal air. Musculoskeletal: Lumbosacral spondylosis. S shaped thoracolumbar spine curvature. Right L1 transverse process lucency on 27/3 is similar and most likely developmental. IMPRESSION: 1. No posttraumatic deformity identified. 2. New areas of bibasilar airspace disease since 06/06/2021. Concurrent cylindrical bronchiectasis. Most consistent with infection or aspiration of indeterminate acuity. 3. Non complicated descending diverticulitis. 4. Nonobstructive small bowel extends minimally into an area  of ventral pelvic wall laxity. 5. Bilateral renal lesions of varying complexity, incompletely characterized on this noncontrast exam. These could represent hemorrhagic cysts or solid neoplasms. Consider further evaluation with outpatient pre and post contrast abdominal MRI. If this 86 year old patient is not a good MRI candidate, renal protocol CT at 6 months suggested. 6.  Tiny hiatal hernia. 7. Aortic atherosclerosis (ICD10-I70.0), coronary artery atherosclerosis and emphysema (ICD10-J43.9). 8. 6 mm left lower lobe pulmonary nodule which is likely new since the prior. Non-contrast chest CT at 6-12 months is recommended. If the nodule is stable at time of repeat CT, then future CT at 18-24 months (from today's scan) is considered optional for low-risk patients, but is recommended for high-risk patients. This recommendation follows the consensus statement: Guidelines for Management of Incidental Pulmonary Nodules Detected on CT Images: From the Fleischner Society 2017; Radiology 2017; 284:228-243. Electronically Signed   By: Abigail Miyamoto M.D.   On: 09/26/2021 14:10  CT Head Wo Contrast  Result Date: 09/26/2021 CLINICAL DATA:  Trauma EXAM: CT HEAD WITHOUT CONTRAST TECHNIQUE: Contiguous axial images were obtained from the base of the skull through the vertex without intravenous contrast. RADIATION DOSE REDUCTION: This exam was performed according to the departmental dose-optimization program which includes automated exposure control, adjustment of the mA and/or kV according to patient size and/or use of iterative reconstruction technique. COMPARISON:  None. FINDINGS: Brain: No acute intracranial findings are seen. There are no signs of bleeding. Cavum septum pellucidum and cavum septum vergae are seen. There is no shift of midline structures. Cortical sulci are prominent. Vascular: There are scattered arterial calcifications. Skull: No fracture is seen. Sinuses/Orbits: There is small air-fluid level in sphenoid  sinus. Mucosal thickening is seen in the ethmoid and maxillary sinuses. There is fluid density in the mastoid air cells on both sides. There is no break in the cortical margins in the mastoids. There is suggestion of previous cataract surgery in both optic globes. Other: None IMPRESSION: No acute intracranial findings are seen.  Atrophy. Chronic ethmoid and maxillary sinusitis. Small air-fluid level is seen in the sphenoid sinus suggesting possible acute sinusitis. There is fluid density in mastoid air cells on both sides suggesting mastoid effusions or chronic mastoiditis. Electronically Signed   By: Elmer Picker M.D.   On: 09/26/2021 13:58   CT Cervical Spine Wo Contrast  Result Date: 09/26/2021 CLINICAL DATA:  Facial trauma, blunt force injury in a male at age 63. EXAM: CT CERVICAL SPINE WITHOUT CONTRAST TECHNIQUE: Multidetector CT imaging of the cervical spine was performed without intravenous contrast. Multiplanar CT image reconstructions were also generated. RADIATION DOSE REDUCTION: This exam was performed according to the departmental dose-optimization program which includes automated exposure control, adjustment of the mA and/or kV according to patient size and/or use of iterative reconstruction technique. COMPARISON:  None FINDINGS: Alignment: Mild straightening of normal cervical lordotic curvature. Mild anterolisthesis of C3 on C4 and C4 on C5. Skull base and vertebrae: No evidence of acute fracture. Increased basin into dens interval is just above normal range at 11 mm but without associated soft tissue swelling. There is degenerative change at C1-C2. C1 shows normal relationship to the occipital condyles. Degenerative changes at the tip of the dens and around the dens are noted. Soft tissues and spinal canal: No prevertebral fluid or swelling. No visible canal hematoma. Disc levels: Multilevel degenerative changes with disc space narrowing and facet arthropathy likely explains mild  anterolisthesis of C3 on C4 and C4 on C5. RIGHT-sided C2-3 facet fusion is demonstrated. Moderate to marked facet arthropathy seen elsewhere in the cervical spine. Upper chest: Negative. Other: None IMPRESSION: 1. No acute fracture or traumatic malalignment in the cervical spine. 2. Increased basilar and dens interval, just above normal with associated degenerative changes at C1-2 and at the tip of the dens favored to represent sequela of remote trauma and or degenerative process. In the absence of priors for comparison, if there is high clinical suspicion for ligamentous injury MRI could be helpful for further assessment. 3. Multilevel degenerative changes with disc space narrowing and facet arthropathy likely explains mild anterolisthesis of C3 on C4 and C4 on C5. Electronically Signed   By: Zetta Bills M.D.   On: 09/26/2021 14:26   DG Chest Portable 1 View  Result Date: 09/26/2021 CLINICAL DATA:  A male at age [age] presents for evaluation of foot injury and fall with low blood pressure. EXAM: PORTABLE CHEST 1 VIEW COMPARISON:  July 30, 2021. FINDINGS: EKG leads project over the chest. Trachea midline. Cardiomediastinal contours and hilar structures are stable. There is bibasilar airspace disease with more linear appearance over the RIGHT as compared to the LEFT hemidiaphragm. Increased airspace process compared to more remote priors at the LEFT lung base and similar appearance as compared to prior studies with respect to RIGHT lower lobe findings. No visible pneumothorax. On limited assessment there is no acute skeletal finding. IMPRESSION: Bibasilar airspace disease likely atelectasis given morphology, likely associated with scarring at the RIGHT lung base based on prior imaging. Given increased airspace disease at the LEFT lung base however compared to prior studies would correlate with any signs of pulmonary infection. Electronically Signed   By: Zetta Bills M.D.   On: 09/26/2021 11:45   DG  Foot Complete Right  Result Date: 09/26/2021 CLINICAL DATA:  Right hallux injury, fall EXAM: RIGHT FOOT COMPLETE - 3+ VIEW COMPARISON:  None. FINDINGS: No acute fracture or dislocation. Pes cavus alignment. Mild hallux valgus. Small erosion with well-defined sclerotic margin along the medial aspect of the first metatarsal head, likely sequela of crystalline arthropathy such as gout. Small plantar calcaneal spur. Soft tissue swelling over the dorsum of the forefoot. Atherosclerotic vascular calcifications are present. IMPRESSION: 1. No acute fracture or dislocation. Soft tissue swelling over the dorsum of the forefoot. 2. Mild hallux valgus. Electronically Signed   By: Davina Poke D.O.   On: 09/26/2021 11:51   CT Maxillofacial Wo Contrast  Result Date: 09/26/2021 CLINICAL DATA:  In 86 year old male presents for evaluation of trauma. EXAM: CT MAXILLOFACIAL WITHOUT CONTRAST TECHNIQUE: Multidetector CT imaging of the maxillofacial structures was performed. Multiplanar CT image reconstructions were also generated. RADIATION DOSE REDUCTION: This exam was performed according to the departmental dose-optimization program which includes automated exposure control, adjustment of the mA and/or kV according to patient size and/or use of iterative reconstruction technique. COMPARISON:  Head CT of the same date. FINDINGS: Osseous: No fracture or mandibular dislocation. No destructive process. Orbits: Negative. No traumatic or inflammatory finding. Sinuses: Mucosal thickening in the maxillary sinuses. Small amount of debris in the dependent sphenoid sinus on the RIGHT. Scattered ethmoid mucosal thickening. No air-fluid levels in the sinuses. Incidental note is made of bilateral chronic mastoid effusions not substantially changed since imaging dating back to 2016 Soft tissues: Negative. Limited intracranial: No significant or unexpected finding. IMPRESSION: 1. No signs of acute facial bone fracture. 2. Sinus disease and  chronic mastoid effusions. Electronically Signed   By: Zetta Bills M.D.   On: 09/26/2021 15:00   Medications: Infusions:  sodium chloride 100 mL/hr at 09/26/21 2135   pantoprazole 8 mg/hr (09/26/21 1600)    Scheduled Medications:   have reviewed scheduled and prn medications.  Physical Exam: General: pale, pleasant and conversational-  possibly a bit confused Heart: RRR Lungs: clear Abdomen: soft, non tender Extremities: no edema    09/27/2021,10:49 AM  LOS: 1 day

## 2021-09-27 NOTE — Progress Notes (Signed)
Pt asking for an aspirin for pain and sleep. Pt is c/o generalized pain from fall at 6/10. RN messaged MD on call to see if we can get a prn order for pain.   Harry Norris Harry Norris

## 2021-09-27 NOTE — Progress Notes (Signed)
Pharmacy Antibiotic Note  Harry Norris is a 86 y.o. male admitted on 09/26/2021 with  intraabdominal infection .  Pharmacy has been consulted for Zosyn dosing.  Plan: Zosyn 2.25g IV q 8 hrs  Height: 5\' 9"  (175.3 cm) Weight: 71.4 kg (157 lb 6.5 oz) IBW/kg (Calculated) : 70.7  Temp (24hrs), Avg:97.8 F (36.6 C), Min:97.5 F (36.4 C), Max:98 F (36.7 C)  Recent Labs  Lab 09/26/21 1125 09/26/21 1518 09/27/21 0146  WBC 7.7  --  6.9  CREATININE 5.89*  --  5.04*   5.06*  LATICACIDVEN  --  0.9  --     Estimated Creatinine Clearance: 10.9 mL/min (A) (by C-G formula based on SCr of 5.06 mg/dL (H)).    Allergies  Allergen Reactions   Ace Inhibitors Cough    Antimicrobials this admission: Zosyn 1/28 >   Dose adjustments this admission:  Microbiology results: 1/27 BCx x 2 >   Thank you for allowing pharmacy to be a part of this patients care.  [place] Crane, Roylene Reason, BCCP Clinical Pharmacist  09/27/2021 9:49 PM   [facility] pharmacy phone numbers are listed on [email or web address]

## 2021-09-27 NOTE — Progress Notes (Signed)
FPTS Brief Progress Note  S: Came to patient's bedside for night rounds.  Patient was asleep upon arrival and I did not wake him up.  Rounded with patient's nurse who said patient seemed to be slightly confused earlier in the night.  He eventually settled down and went to sleep.   O: BP (!) 109/47 (BP Location: Right Arm)    Pulse 98    Temp 98.1 F (36.7 C)    Resp 20    Ht 5\' 9"  (1.753 m)    Wt 71.4 kg    SpO2 (!) 84%    BMI 23.25 kg/m   General: Asleep, NAD Pulm: Nonlabored breathing on room air  A/P: -Continue plan per day team - Orders reviewed. Labs for AM ordered, which was adjusted as needed.   Alen Bleacher, MD 09/27/2021, 12:59 AM PGY-1, [facility] Night Resident  Please page [phone] with questions.

## 2021-09-27 NOTE — Consult Note (Signed)
Referring Provider: ? Primary Care Physician:  [name], MD Primary Gastroenterologist:  Althia Forts  Reason for Consultation:  Anemia and heme positive stools  HPI: Harry Norris is a 86 y.o. male PMH is significant for DM, COPD, cardiomyopathy EF 35%, HLD, HTN, HLD, prostate cancer, AAA,and CKD who presented to [facility] with complaints of weakness and multiple falls at home.  Apparently he was home in the kitchen and lost his balance and fell.  Family has attributed this to dehydration.  He tells me that he was having diarrhea for about a week or 2, but has not had any more diarrhea in the past few days.  It appears that this was maybe following treatment with antibiotics for pneumonia.  Hemoglobin was found to be down to 9.2 g compared to 12.6 g in early December.  He was found to be heme positive but had brown stool per EDP note.  CT scan of the abdomen and pelvis without contrast showed the following:  IMPRESSION: 1. No posttraumatic deformity identified. 2. New areas of bibasilar airspace disease since 06/06/2021. Concurrent cylindrical bronchiectasis. Most consistent with infection or aspiration of indeterminate acuity. 3. Non complicated descending diverticulitis. 4. Nonobstructive small bowel extends minimally into an area of ventral pelvic wall laxity. 5. Bilateral renal lesions of varying complexity, incompletely characterized on this noncontrast exam. These could represent hemorrhagic cysts or solid neoplasms. Consider further evaluation with outpatient pre and post contrast abdominal MRI. If this 86 year old patient is not a good MRI candidate, renal protocol CT at 6 months suggested. 6.  Tiny hiatal hernia. 7. Aortic atherosclerosis (ICD10-I70.0), coronary artery atherosclerosis and emphysema (ICD10-J43.9). 8. 6 mm left lower lobe pulmonary nodule which is likely new since the prior. Non-contrast chest CT at 6-12 months is recommended. If the nodule  is stable at time of repeat CT, then future CT at 18-24 months (from today's scan) is considered optional for low-risk patients, but is recommended for high-risk patients. This recommendation follows the consensus statement: Guidelines for Management of Incidental Pulmonary Nodules Detected on CT Images: From the Fleischner Society 2017; Radiology 2017; 284:228-243.   The patient tells me that he has never had an EGD or colonoscopy, but there is noted in his history of "neoplasm of the colon".  He tells me that he is about to starve and he wants to eat.  He denies any nausea, vomiting, abdominal pain.  He says no further diarrhea in the last few days.  He denies any dark or bloody stools.  He is on aspirin at home and says that he does take naproxen on occasion, but not regularly.  No family was present at the time of my visit.   Past Medical History:  Diagnosis Date   AAA (abdominal aortic aneurysm)    Anemia    Asthma 02/07/2018   Benign neoplasm of colon    Bradycardia 12/19/2018   Bronchitis    Cancer ([place])    prostate   Cardiomyopathy ([place]) 12/18/2015   Carotid artery occlusion    Chronic rhinitis 02/26/2016   Chronic venous insufficiency 12/18/2015   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   PFTs 01/18/14:  FeV1 1.58 45% FVC 2.0 50% FeV1/FVC 79% Fef 25-75 795 ( 40% improved after BD) CXR: Copd changes ONO 03/07/14 desats to <88% on RA    Diverticulosis of colon (without mention of hemorrhage)    DM type 2 (diabetes mellitus, type 2) (HCC)    Fatigue 12/12/2018   Hyperlipidemia  Hypertension    Hypertensive heart disease    Hypoxemia 04/10/2014   Overview:  Last Assessment & Plan:  Cont nocturnal oxygen therapy 2L    Kidney stone    LBBB (left bundle branch block) 12/18/2015   Leg swelling 11/28/2013   Orthostatic hypotension 12/19/2018   Other primary cardiomyopathies    Other symptoms involving cardiovascular system 11/28/2013   Prostate cancer ([place])    prostate    Syncope  12/18/2015   Unspecified venous (peripheral) insufficiency    Varicose veins    Varicose veins of lower extremities with other complications 8/46/9629    Past Surgical History:  Procedure Laterality Date    cardiac catheterization  may 2011   CATARACT EXTRACTION Bilateral    ENDOVENOUS ABLATION SAPHENOUS VEIN W/ LASER Right 03-29-2014   EVLA right greater saphenous vein     gold seed implants  2011   treatment of prostate cancer   HERNIA REPAIR Bilateral    LITHOTRIPSY  2010   right ear surgery      Prior to Admission medications   Medication Sig Start Date End Date Taking? Authorizing Provider  albuterol (VENTOLIN HFA) 108 (90 Base) MCG/ACT inhaler Inhale 2 puffs into the lungs every 6 (six) hours as needed for wheezing or shortness of breath.   Yes [provider]  aspirin EC 81 MG tablet Take 81 mg by mouth daily. Swallow whole.   Yes [provider]  atorvastatin (LIPITOR) 40 MG tablet Take 40 mg by mouth at bedtime.   Yes [provider]  carvedilol (COREG) 6.25 MG tablet Take 6.25 mg by mouth 2 (two) times daily with a meal.   Yes [provider]  fenofibrate 160 MG tablet Take 160 mg by mouth daily.   Yes [provider]  FLOVENT HFA 110 MCG/ACT inhaler Inhale 2 puffs into the lungs 2 (two) times daily. 09/09/21  Yes [provider]  formoterol (PERFOROMIST) 20 MCG/2ML nebulizer solution Take 20 mcg by nebulization 2 (two) times daily.   Yes [provider]  furosemide (LASIX) 40 MG tablet Take 40 mg by mouth in the morning.   Yes [provider]  Melatonin 10 MG TABS Take 1 tablet by mouth at bedtime.   Yes [provider]  Multiple Vitamins-Minerals (CENTRUM SILVER 50+MEN) TABS Take 1 tablet by mouth daily with breakfast.   Yes [provider]  naproxen (NAPROSYN) 500 MG tablet Take 500 mg by mouth daily as needed (for pain).   Yes [provider]  Omega-3 Fatty Acids (FISH OIL)  1000 MG CAPS Take 1,000 mg by mouth 2 (two) times daily.   Yes [provider]  OXYGEN Inhale 2 L/min into the lungs as needed (for shortness of breath).   Yes [provider]  oxymetazoline (AFRIN) 0.05 % nasal spray Place 1 spray into both nostrils 2 (two) times daily as needed for congestion.   Yes [provider]  vitamin C (ASCORBIC ACID) 500 MG tablet Take 500 mg by mouth daily.   Yes [provider]  vitamin E 400 UNIT capsule Take 400 Units by mouth daily.   Yes [provider]  arformoterol (BROVANA) 15 MCG/2ML NEBU Take 2 mLs (15 mcg total) by nebulization 2 (two) times daily. Patient not taking: Reported on 08/07/2021 08/03/21   Little Ishikawa, MD  revefenacin [facility]) 175 MCG/3ML nebulizer solution Take 3 mLs (175 mcg total) by nebulization daily. Patient not taking: Reported on 09/26/2021 08/04/21   Holli Humbles  C, MD    Current Facility-Administered Medications  Medication Dose Route Frequency Provider Last Rate Last Admin   0.9 %  sodium chloride infusion   Intravenous Continuous Holley Bouche, MD 100 mL/hr at 09/26/21 2135 New Bag at 09/26/21 2135   pantoprozole (PROTONIX) 80 mg /NS 100 mL infusion  8 mg/hr Intravenous Continuous Shary Key, DO 10 mL/hr at 09/26/21 1600 8 mg/hr at 09/26/21 1600    Allergies as of 09/26/2021 - Review Complete 09/26/2021  Allergen Reaction Noted   Ace inhibitors Cough 10/20/2013    Family History  Problem Relation Age of Onset   Cancer - Colon Mother        deceased   Hypertension Mother    Diabetes Mother    Emphysema Sister        deceased   Heart disease Sister    Asthma Sister    Kidney failure Father    Heart failure Father     Social History   Socioeconomic History   Marital status: Married    Spouse name: Not on file   Number of children: Not on file   Years of education: Not on file   Highest education level: Not on file  Occupational History   Not on  file  Tobacco Use   Smoking status: Former    Packs/day: 2.50    Years: 40.00    Pack years: 100.00    Types: Cigarettes    Quit date: 09/01/1999    Years since quitting: 22.0   Smokeless tobacco: Never  Vaping Use   Vaping Use: Never used  Substance and Sexual Activity   Alcohol use: No   Drug use: No   Sexual activity: Not on file  Other Topics Concern   Not on file  Social History Narrative   Lives with wife.   Retired.   Telephone business prior.   Social Determinants of Health   Financial Resource Strain: Not on file  Food Insecurity: No Food Insecurity   Worried About Charity fundraiser in the Last Year: Never true   Ran Out of Food in the Last Year: Never true  Transportation Needs: No Transportation Needs   Lack of Transportation (Medical): No   Lack of Transportation (Non-Medical): No  Physical Activity: Not on file  Stress: Not on file  Social Connections: Not on file  Intimate Partner Violence: Not on file    Review of Systems: ROS is O/w negative except as mentioned in HPI.  Physical Exam: Vital signs in last 24 hours: Temp:  [97.5 F (36.4 C)-98.3 F (36.8 C)] 97.9 F (36.6 C) (01/28 0453) Pulse Rate:  [59-98] 88 (01/28 0453) Resp:  [16-27] 16 (01/28 0453) BP: (97-136)/(35-116) 106/84 (01/28 0453) SpO2:  [84 %-100 %] 93 % (01/28 0453) Weight:  [71.4 kg] 71.4 kg (01/28 0453) Last BM Date: 09/26/21 General:  Alert, frail and chronically ill-appearing, pleasant and cooperative in NAD Head:  Normocephalic and atraumatic. Eyes:  Sclera clear, no icterus.  Conjunctiva pink. Ears:  Normal auditory acuity. Mouth:  No deformity or lesions.   Lungs:  Clear throughout to auscultation.  No wheezes, crackles, or rhonchi.  Heart:  Regular rate and rhythm; no murmurs, clicks, rubs, or gallops. Abdomen:  Soft, non-distended.  BS present.  Non-tender. Rectal:  Heme positive per EDP. Msk:  Symmetrical without gross deformities. Pulses:  Normal pulses  noted. Extremities:  Without clubbing or edema. Neurologic:  Alert and oriented x 4;  grossly normal neurologically. Skin:  Intact without significant lesions or rashes.  Very dry and flaky skin covering his face. Psych:  Alert and cooperative. Normal mood and affect.  Intake/Output from previous day: 01/27 0701 - 01/28 0700 In: 1964.9 [I.V.:964.9; IV Piggyback:1000] Out: -   Lab Results: Recent Labs    09/26/21 1125 09/26/21 1147 09/27/21 0146  WBC 7.7  --  6.9  HGB 9.2* 9.5* 9.1*  HCT 27.7* 28.0* 27.8*  PLT 141*  --  143*   BMET Recent Labs    09/26/21 1125 09/26/21 1147 09/27/21 0146  NA 130* 130* 136   137  K 3.8 3.9 3.7   3.7  CL 96*  --  103   104  CO2 17*  --  15*   16*  GLUCOSE 93  --  66*   66*  BUN 86*  --  81*   80*  CREATININE 5.89*  --  5.04*   5.06*  CALCIUM 8.0*  --  7.8*   7.7*   LFT Recent Labs    09/27/21 0146  PROT 5.0*  ALBUMIN 2.3*   2.3*  AST 27  ALT 37  ALKPHOS 35*  BILITOT 0.5   Studies/Results: CT ABDOMEN PELVIS WO CONTRAST  Result Date: 09/26/2021 CLINICAL DATA:  Acute abdominal pain with fall. EXAM: CT ABDOMEN AND PELVIS WITHOUT CONTRAST TECHNIQUE: Multidetector CT imaging of the abdomen and pelvis was performed following the standard protocol without IV contrast. RADIATION DOSE REDUCTION: This exam was performed according to the departmental dose-optimization program which includes automated exposure control, adjustment of the mA and/or kV according to patient size and/or use of iterative reconstruction technique. COMPARISON:  Plain film 07/29/2021. CTA of the abdomen and pelvis of 06/06/2021. FINDINGS: Lower chest: Centrilobular emphysema. Bibasilar patchy airspace disease with areas of cylindrical bronchiectasis. No basilar pulmonary contusion or pneumothorax. 6 mm left lower lobe pulmonary nodule on 16/4 is likely new since the prior. Normal heart size without pericardial or pleural effusion. Lad coronary artery calcification.  Hepatobiliary: Normal liver. Normal gallbladder, without biliary ductal dilatation. Pancreas: Normal, without mass or ductal dilatation. Spleen: Normal in size, without focal abnormality. Adrenals/Urinary Tract: Normal adrenal glands. Bilateral renal cortical thinning. Within both kidneys, there are renal lesions of varying complexity. Maximally 2.7 cm in the interpolar right kidney which is most consistent with a minimally complex cyst. Other lesions measure greater than fluid density, including at 1.7 cm in the interpolar right kidney on 36/3, and scattered throughout the left kidney including at 1.8 cm in the interpolar region on 41/3. When comparing to the CTA of 06/06/2021, some demonstrate possible enhancement, including the above 1.7 cm interpolar right renal lesion. No hydronephrosis.  No bladder calculi. Stomach/Bowel: Tiny hiatal hernia. Extensive colonic diverticulosis. Edema is seen adjacent to the distal descending colon including on 49/3. Normal terminal ileum and appendix. Minimal small bowel extends into an area of ventral pelvic wall laxity on 60/3, similar. Vascular/Lymphatic: Advanced aortic and branch vessel atherosclerosis. Infrarenal abdominal aortic aneurysm of 3.5 cm is similar to on the prior exam. No surrounding hemorrhage. A retroaortic left renal vein. No abdominopelvic adenopathy. Reproductive: Radiation seeds in the prostate. Other: No significant free fluid. Small fat containing right inguinal hernia. No free intraperitoneal air. Musculoskeletal: Lumbosacral spondylosis. S shaped thoracolumbar spine curvature. Right L1 transverse process lucency on 27/3 is similar and most likely developmental. IMPRESSION: 1. No posttraumatic deformity identified. 2. New areas of bibasilar airspace disease since 06/06/2021. Concurrent cylindrical bronchiectasis. Most consistent with infection or aspiration of indeterminate acuity.  3. Non complicated descending diverticulitis. 4. Nonobstructive small  bowel extends minimally into an area of ventral pelvic wall laxity. 5. Bilateral renal lesions of varying complexity, incompletely characterized on this noncontrast exam. These could represent hemorrhagic cysts or solid neoplasms. Consider further evaluation with outpatient pre and post contrast abdominal MRI. If this 86 year old patient is not a good MRI candidate, renal protocol CT at 6 months suggested. 6.  Tiny hiatal hernia. 7. Aortic atherosclerosis (ICD10-I70.0), coronary artery atherosclerosis and emphysema (ICD10-J43.9). 8. 6 mm left lower lobe pulmonary nodule which is likely new since the prior. Non-contrast chest CT at 6-12 months is recommended. If the nodule is stable at time of repeat CT, then future CT at 18-24 months (from today's scan) is considered optional for low-risk patients, but is recommended for high-risk patients. This recommendation follows the consensus statement: Guidelines for Management of Incidental Pulmonary Nodules Detected on CT Images: From the Fleischner Society 2017; Radiology 2017; 284:228-243. Electronically Signed   By: Abigail Miyamoto M.D.   On: 09/26/2021 14:10   CT Head Wo Contrast  Result Date: 09/26/2021 CLINICAL DATA:  Trauma EXAM: CT HEAD WITHOUT CONTRAST TECHNIQUE: Contiguous axial images were obtained from the base of the skull through the vertex without intravenous contrast. RADIATION DOSE REDUCTION: This exam was performed according to the departmental dose-optimization program which includes automated exposure control, adjustment of the mA and/or kV according to patient size and/or use of iterative reconstruction technique. COMPARISON:  None. FINDINGS: Brain: No acute intracranial findings are seen. There are no signs of bleeding. Cavum septum pellucidum and cavum septum vergae are seen. There is no shift of midline structures. Cortical sulci are prominent. Vascular: There are scattered arterial calcifications. Skull: No fracture is seen. Sinuses/Orbits: There  is small air-fluid level in sphenoid sinus. Mucosal thickening is seen in the ethmoid and maxillary sinuses. There is fluid density in the mastoid air cells on both sides. There is no break in the cortical margins in the mastoids. There is suggestion of previous cataract surgery in both optic globes. Other: None IMPRESSION: No acute intracranial findings are seen.  Atrophy. Chronic ethmoid and maxillary sinusitis. Small air-fluid level is seen in the sphenoid sinus suggesting possible acute sinusitis. There is fluid density in mastoid air cells on both sides suggesting mastoid effusions or chronic mastoiditis. Electronically Signed   By: Elmer Picker M.D.   On: 09/26/2021 13:58   CT Cervical Spine Wo Contrast  Result Date: 09/26/2021 CLINICAL DATA:  Facial trauma, blunt force injury in a male at age 9. EXAM: CT CERVICAL SPINE WITHOUT CONTRAST TECHNIQUE: Multidetector CT imaging of the cervical spine was performed without intravenous contrast. Multiplanar CT image reconstructions were also generated. RADIATION DOSE REDUCTION: This exam was performed according to the departmental dose-optimization program which includes automated exposure control, adjustment of the mA and/or kV according to patient size and/or use of iterative reconstruction technique. COMPARISON:  None FINDINGS: Alignment: Mild straightening of normal cervical lordotic curvature. Mild anterolisthesis of C3 on C4 and C4 on C5. Skull base and vertebrae: No evidence of acute fracture. Increased basin into dens interval is just above normal range at 11 mm but without associated soft tissue swelling. There is degenerative change at C1-C2. C1 shows normal relationship to the occipital condyles. Degenerative changes at the tip of the dens and around the dens are noted. Soft tissues and spinal canal: No prevertebral fluid or swelling. No visible canal hematoma. Disc levels: Multilevel degenerative changes with disc space narrowing and facet  arthropathy  likely explains mild anterolisthesis of C3 on C4 and C4 on C5. RIGHT-sided C2-3 facet fusion is demonstrated. Moderate to marked facet arthropathy seen elsewhere in the cervical spine. Upper chest: Negative. Other: None IMPRESSION: 1. No acute fracture or traumatic malalignment in the cervical spine. 2. Increased basilar and dens interval, just above normal with associated degenerative changes at C1-2 and at the tip of the dens favored to represent sequela of remote trauma and or degenerative process. In the absence of priors for comparison, if there is high clinical suspicion for ligamentous injury MRI could be helpful for further assessment. 3. Multilevel degenerative changes with disc space narrowing and facet arthropathy likely explains mild anterolisthesis of C3 on C4 and C4 on C5. Electronically Signed   By: Zetta Bills M.D.   On: 09/26/2021 14:26   DG Chest Portable 1 View  Result Date: 09/26/2021 CLINICAL DATA:  A male at age [age] presents for evaluation of foot injury and fall with low blood pressure. EXAM: PORTABLE CHEST 1 VIEW COMPARISON:  July 30, 2021. FINDINGS: EKG leads project over the chest. Trachea midline. Cardiomediastinal contours and hilar structures are stable. There is bibasilar airspace disease with more linear appearance over the RIGHT as compared to the LEFT hemidiaphragm. Increased airspace process compared to more remote priors at the LEFT lung base and similar appearance as compared to prior studies with respect to RIGHT lower lobe findings. No visible pneumothorax. On limited assessment there is no acute skeletal finding. IMPRESSION: Bibasilar airspace disease likely atelectasis given morphology, likely associated with scarring at the RIGHT lung base based on prior imaging. Given increased airspace disease at the LEFT lung base however compared to prior studies would correlate with any signs of pulmonary infection. Electronically Signed   By: Zetta Bills M.D.    On: 09/26/2021 11:45   DG Foot Complete Right  Result Date: 09/26/2021 CLINICAL DATA:  Right hallux injury, fall EXAM: RIGHT FOOT COMPLETE - 3+ VIEW COMPARISON:  None. FINDINGS: No acute fracture or dislocation. Pes cavus alignment. Mild hallux valgus. Small erosion with well-defined sclerotic margin along the medial aspect of the first metatarsal head, likely sequela of crystalline arthropathy such as gout. Small plantar calcaneal spur. Soft tissue swelling over the dorsum of the forefoot. Atherosclerotic vascular calcifications are present. IMPRESSION: 1. No acute fracture or dislocation. Soft tissue swelling over the dorsum of the forefoot. 2. Mild hallux valgus. Electronically Signed   By: Davina Poke D.O.   On: 09/26/2021 11:51   CT Maxillofacial Wo Contrast  Result Date: 09/26/2021 CLINICAL DATA:  In 86 year old male presents for evaluation of trauma. EXAM: CT MAXILLOFACIAL WITHOUT CONTRAST TECHNIQUE: Multidetector CT imaging of the maxillofacial structures was performed. Multiplanar CT image reconstructions were also generated. RADIATION DOSE REDUCTION: This exam was performed according to the departmental dose-optimization program which includes automated exposure control, adjustment of the mA and/or kV according to patient size and/or use of iterative reconstruction technique. COMPARISON:  Head CT of the same date. FINDINGS: Osseous: No fracture or mandibular dislocation. No destructive process. Orbits: Negative. No traumatic or inflammatory finding. Sinuses: Mucosal thickening in the maxillary sinuses. Small amount of debris in the dependent sphenoid sinus on the RIGHT. Scattered ethmoid mucosal thickening. No air-fluid levels in the sinuses. Incidental note is made of bilateral chronic mastoid effusions not substantially changed since imaging dating back to 2016 Soft tissues: Negative. Limited intracranial: No significant or unexpected finding. IMPRESSION: 1. No signs of acute facial bone  fracture. 2. Sinus disease and  chronic mastoid effusions. Electronically Signed   By: Zetta Bills M.D.   On: 09/26/2021 15:00    IMPRESSION:  *Anemia, heme positive stools (stools were brown per EDP note): Hemoglobin 9.5 g on admission, 12.6 g in December. *Weakness and falls at home: Thought to be due to dehydration and orthostasis *Diarrhea: Patient is having was previously for about 2 weeks, but says that he has not had any more diarrhea in the last few days.  Seems that this was following treatment with antibiotics for pneumonia. *Diverticulitis by CT scan: Patient without pain or tenderness. *Chronic kidney disease:  Being seen by renal.  PLAN: -Would treat with antibiotics as he may not express as much pain due to his advanced age.  ?  Zosyn would be a better option then Cipro and Flagyl due to the risks of Cipro in elderly patients.  Also it looks that he is being treated for possible pneumonia or sinusitis. -He tells me that currently he does not want any procedures.  He wants to eat.  I put him on a soft diet. -We will monitor hemoglobin and transfuse if needed. -Does not need PPI drip.  I discontinued that and started pantoprazole 40 mg IV twice daily for now. -Stool for C. difficile has been ordered if diarrhea returns.   Laban Emperor. Olukemi Panchal  09/27/2021, 9:42 AM

## 2021-09-28 LAB — CBC
HCT: 24.7 % — ABNORMAL LOW (ref 39.0–52.0)
Hemoglobin: 8 g/dL — ABNORMAL LOW (ref 13.0–17.0)
MCH: 28.7 pg (ref 26.0–34.0)
MCHC: 32.4 g/dL (ref 30.0–36.0)
MCV: 88.5 fL (ref 80.0–100.0)
Platelets: 162 10*3/uL (ref 150–400)
RBC: 2.79 MIL/uL — ABNORMAL LOW (ref 4.22–5.81)
RDW: 17.1 % — ABNORMAL HIGH (ref 11.5–15.5)
WBC: 5.7 10*3/uL (ref 4.0–10.5)
nRBC: 0 % (ref 0.0–0.2)

## 2021-09-28 LAB — RENAL FUNCTION PANEL
Albumin: 2.1 g/dL — ABNORMAL LOW (ref 3.5–5.0)
Anion gap: 10 (ref 5–15)
BUN: 68 mg/dL — ABNORMAL HIGH (ref 8–23)
CO2: 24 mmol/L (ref 22–32)
Calcium: 7.5 mg/dL — ABNORMAL LOW (ref 8.9–10.3)
Chloride: 102 mmol/L (ref 98–111)
Creatinine, Ser: 3.82 mg/dL — ABNORMAL HIGH (ref 0.61–1.24)
GFR, Estimated: 15 mL/min — ABNORMAL LOW (ref >60)
Glucose, Bld: 130 mg/dL — ABNORMAL HIGH (ref 70–99)
Phosphorus: 2.6 mg/dL (ref 2.5–4.6)
Potassium: 3.6 mmol/L (ref 3.5–5.1)
Sodium: 136 mmol/L (ref 135–145)

## 2021-09-28 LAB — IRON AND TIBC
Iron: 50 ug/dL (ref 45–182)
Saturation Ratios: 22 % (ref 17.9–39.5)
TIBC: 225 ug/dL — ABNORMAL LOW (ref 250–450)
UIBC: 175 ug/dL

## 2021-09-28 LAB — GLUCOSE, CAPILLARY
Glucose-Capillary: 119 mg/dL — ABNORMAL HIGH (ref 70–99)
Glucose-Capillary: 92 mg/dL (ref 70–99)
Glucose-Capillary: 111 mg/dL — ABNORMAL HIGH (ref 70–99)
Glucose-Capillary: 94 mg/dL (ref 70–99)

## 2021-09-28 MED ORDER — MELATONIN 5 MG PO TABS
5.0000 mg | ORAL_TABLET | Freq: Every day | ORAL | Status: DC
  Administered 2021-09-28: 5 mg via ORAL
  Filled 2021-09-28: qty 1

## 2021-09-28 MED ORDER — LACTATED RINGERS IV SOLN: INTRAVENOUS | Status: DC

## 2021-09-28 MED ORDER — PIPERACILLIN-TAZOBACTAM 3.375 G IVPB
3.3750 g | Freq: Three times a day (TID) | INTRAVENOUS | Status: DC
  Administered 2021-09-28 – 2021-09-29 (×3): 3.375 g via INTRAVENOUS
  Filled 2021-09-28 (×4): qty 50

## 2021-09-28 MED ORDER — DARBEPOETIN ALFA 150 MCG/0.3ML IJ SOSY
150.0000 ug | PREFILLED_SYRINGE | Freq: Once | INTRAMUSCULAR | Status: AC
  Administered 2021-09-28: 150 ug via SUBCUTANEOUS
  Filled 2021-09-28: qty 0.3

## 2021-09-28 MED ORDER — POTASSIUM CHLORIDE CRYS ER 20 MEQ PO TBCR
40.0000 meq | EXTENDED_RELEASE_TABLET | Freq: Once | ORAL | Status: AC
  Administered 2021-09-28: 40 meq via ORAL
  Filled 2021-09-28: qty 2

## 2021-09-28 MED ORDER — SODIUM CHLORIDE 0.9 % IV SOLN
250.0000 mg | Freq: Every day | INTRAVENOUS | Status: DC
  Administered 2021-09-28 – 2021-09-29 (×2): 250 mg via INTRAVENOUS
  Filled 2021-09-28 (×3): qty 20

## 2021-09-28 MED ORDER — OXYCODONE HCL 5 MG PO TABS
5.0000 mg | ORAL_TABLET | Freq: Once | ORAL | Status: AC
  Administered 2021-09-28: 5 mg via ORAL
  Filled 2021-09-28: qty 1

## 2021-09-28 NOTE — Progress Notes (Signed)
FPTS Brief Progress Note  S: Patient is sleeping in bed laying comfortably.   O: BP (!) 129/54    Pulse 90    Temp 98.2 F (36.8 C)    Resp 18    Ht 5\' 9"  (1.753 m)    Wt 74.1 kg    SpO2 94%    BMI 24.12 kg/m    Gen: Resting comfortably Resp: no iWOB  A/P: Plan per day team - Orders reviewed. Labs for AM ordered, which was adjusted as needed.    Gerrit Heck, MD 09/28/2021, 7:41 PM PGY-1, [facility] Night Resident  Please page [phone] with questions.

## 2021-09-28 NOTE — Progress Notes (Signed)
FPTS Brief Progress Note  S:Patient reports that he is wanting to go home tomorrow. He has no concerns at this time and is not hurting anywhere. He does note that he has difficulty with sleeping in the hospital and that he has had to sleep in a chair for several years now due to his breathing.    O: BP (!) 137/92 (BP Location: Left Arm)    Pulse 84    Temp 97.9 F (36.6 C) (Oral)    Resp 18    Ht 5\' 9"  (1.753 m)    Wt 71.4 kg    SpO2 98%    BMI 23.25 kg/m   General: elderly gentleman, sitting up in reclined bed, NAD CV: RRR Resp: breathing comfortably on room air, no increased WOB  A/P: Weakness   Fall in the setting of anemia, dehydration, and hypotension - Orders reviewed. Labs for AM ordered, which was adjusted as needed.  - Patient remains stable with plans unchanged from daily progress note.   Diverticulitis per CT scan   diarrhea GI was consulted and recommended antibiotics - Continue soft diet - Continue Zosyn per pharmacy  Rise Patience, DO 09/28/2021, 12:39 AM PGY-2, [facility] Night Resident  Please page [phone] with questions.

## 2021-09-28 NOTE — Evaluation (Signed)
Occupational Therapy Evaluation Patient Details Name: Harry Norris MRN: [id number] DOB: 07-17-1936 Today's Date: 09/28/2021   History of Present Illness 86 y.o. male presents to [facility] hospital on 09/26/2021 with weakness and multiple falls. Pt noted to be hypotensive and dehydrated on admission. PMH includes DM, COPD, cardiomyopathy EF 35%, HLD, HTN, HLD, prostate cancer.   Clinical Impression   Pt reports independence at baseline with ADLs, reports using cane/rollator for functional mobility. Lives alone but has family available to provide PRN assist. Pt currently min A for ADLs,  mod I for bed mobility, and min guard for transfers with RW. Pt requires cues to keep RW near him during in-room ambulation. Pt presenting with impairments listed below, will follow acutely. Recommend d/c home with [facility].      Recommendations for follow up therapy are one component of a multi-disciplinary discharge planning process, led by the attending physician.  Recommendations may be updated based on patient status, additional functional criteria and insurance authorization.   Follow Up Recommendations  Home health OT    Assistance Recommended at Discharge Intermittent Supervision/Assistance  Patient can return home with the following A little help with walking and/or transfers;A little help with bathing/dressing/bathroom;Assistance with cooking/housework;Assist for transportation;Help with stairs or ramp for entrance    Functional Status Assessment  Patient has had a recent decline in their functional status and demonstrates the ability to make significant improvements in function in a reasonable and predictable amount of time.  Equipment Recommendations  Other (comment);None recommended by OT (pt has all needed DME)    Recommendations for Other Services       Precautions / Restrictions Precautions Precautions: Fall Restrictions Weight Bearing Restrictions: No      Mobility Bed Mobility Overal bed  mobility: Modified Independent                  Transfers Overall transfer level: Needs assistance Equipment used: Rolling walker (2 wheels) Transfers: Sit to/from Stand Sit to Stand: Min guard                  Balance Overall balance assessment: Needs assistance Sitting-balance support: No upper extremity supported, Feet supported Sitting balance-Leahy Scale: Good     Standing balance support: Single extremity supported, Bilateral upper extremity supported, Reliant on assistive device for balance Standing balance-Leahy Scale: Fair Standing balance comment: able to statically stand without RW or UE support                           ADL either performed or assessed with clinical judgement   ADL Overall ADL's : Needs assistance/impaired Eating/Feeding: Set up;Sitting   Grooming: Set up;Sitting   Upper Body Bathing: Minimal assistance;Sitting   Lower Body Bathing: Minimal assistance;Sitting/lateral leans   Upper Body Dressing : Minimal assistance;Sitting   Lower Body Dressing: Minimal assistance Lower Body Dressing Details (indicate cue type and reason): able to don sock sitting up in chair Toilet Transfer: Min guard;Ambulation;Rolling walker (2 wheels);Regular Glass blower/designer Details (indicate cue type and reason): completes toileting in standing Toileting- Clothing Manipulation and Hygiene: Supervision/safety;Sit to/from stand Toileting - Clothing Manipulation Details (indicate cue type and reason): able to manage clothing while toileting in standing     Functional mobility during ADLs: Min guard       Vision   Vision Assessment?: No apparent visual deficits     Perception     Praxis      Pertinent Vitals/Pain Pain Assessment Pain  Assessment: No/denies pain     Hand Dominance Right   Extremity/Trunk Assessment Upper Extremity Assessment Upper Extremity Assessment: Generalized weakness   Lower Extremity Assessment Lower  Extremity Assessment: Defer to PT evaluation   Cervical / Trunk Assessment Cervical / Trunk Assessment: Kyphotic   Communication Communication Communication: HOH   Cognition Arousal/Alertness: Awake/alert Behavior During Therapy: WFL for tasks assessed/performed Overall Cognitive Status: Within Functional Limits for tasks assessed                                       General Comments       Exercises     Shoulder Instructions      Home Living Family/patient expects to be discharged to:: Private residence Living Arrangements: Alone Available Help at Discharge: Family;Friend(s);Available PRN/intermittently Type of Home: House Home Access: Stairs to enter [employer] of Steps: 5 Entrance Stairs-Rails: Can reach both Home Layout: One level     Bathroom Shower/Tub: Tub/shower unit;Walk-in shower (plans to use walk in shower)   Bathroom Toilet: Standard Bathroom Accessibility: Yes How Accessible: Accessible via walker Home Equipment: Rollator (4 wheels);Cane - single point;Shower seat   Additional Comments: reports use of O2 at night as needed      Prior Functioning/Environment Prior Level of Function : Independent/Modified Independent;Driving             Mobility Comments: reports using cane mostly ADLs Comments: did his own cooking cleaning and self care        OT Problem List: Decreased strength;Decreased range of motion;Decreased activity tolerance;Impaired balance (sitting and/or standing);Decreased knowledge of use of DME or AE;Decreased safety awareness      OT Treatment/Interventions: Therapeutic exercise;Self-care/ADL training;Balance training;Patient/family education;Therapeutic activities;DME and/or AE instruction;Energy conservation    OT Goals(Current goals can be found in the care plan section) Acute Rehab OT Goals Patient Stated Goal: to go home OT Goal Formulation: With patient Time For Goal Achievement:  10/12/21 Potential to Achieve Goals: Good ADL Goals Pt Will Perform Upper Body Dressing: with supervision;sitting Pt Will Perform Lower Body Dressing: with supervision;sit to/from stand Pt Will Transfer to Toilet: with supervision;ambulating;regular height toilet Pt Will Perform Tub/Shower Transfer: rolling walker;with supervision;ambulating;shower seat  OT Frequency: Min 2X/week    Co-evaluation              AM-PAC OT "6 Clicks" Daily Activity     Outcome Measure Help from another person eating meals?: None Help from another person taking care of personal grooming?: A Little Help from another person toileting, which includes using toliet, bedpan, or urinal?: A Little Help from another person bathing (including washing, rinsing, drying)?: A Little Help from another person to put on and taking off regular upper body clothing?: A Little Help from another person to put on and taking off regular lower body clothing?: A Little 6 Click Score: 19   End of Session Equipment Utilized During Treatment: Gait belt;Rolling walker (2 wheels) Nurse Communication: Mobility status  Activity Tolerance: Patient tolerated treatment well Patient left: in chair;with call bell/phone within reach;with chair alarm set  OT Visit Diagnosis: Unsteadiness on feet (R26.81);Other abnormalities of gait and mobility (R26.89);Repeated falls (R29.6);History of falling (Z91.81)                Time: 0940-7680 OT Time Calculation (min): 20 min Charges:  OT General Charges $OT Visit: 1 Visit OT Evaluation $OT Eval Low Complexity: [place],  OTD, OTR/L Acute Rehab [phone] [place] 09/28/2021, 3:43 PM

## 2021-09-28 NOTE — Progress Notes (Signed)
Pharmacy Antibiotic Note  SEVEN DOLLENS is a 86 y.o. male admitted on 09/26/2021 with  intraabdominal infection .  Pharmacy has been consulted for Zosyn dosing.  Renal function improved 1/29 (Scr 5.04>3.82) requiring renal dose adjustment.   Plan: Zosyn 2.25g IV q 8 hrs  >>3.375 g IV Q8H  Monitor clinical improvement, de-escalation, cultures   Height: 5\' 9"  (175.3 cm) Weight: 74.1 kg (163 lb 5.8 oz) IBW/kg (Calculated) : 70.7  Temp (24hrs), Avg:97.7 F (36.5 C), Min:97.3 F (36.3 C), Max:97.9 F (36.6 C)  Recent Labs  Lab 09/26/21 1125 09/26/21 1518 09/27/21 0146 09/28/21 0254  WBC 7.7  --  6.9 5.7  CREATININE 5.89*  --  5.04*   5.06* 3.82*  LATICACIDVEN  --  0.9  --   --      Estimated Creatinine Clearance: 14.4 mL/min (A) (by C-G formula based on SCr of 3.82 mg/dL (H)).    Allergies  Allergen Reactions   Ace Inhibitors Cough    Antimicrobials this admission: Zosyn 1/28 >   Dose adjustments this admission: Zosyn 2.25g IV q 8 hrs >>3.375 g IV Q8H (1/29)   Microbiology results: 1/27 BCx x 2: ngtd   Thank you for allowing pharmacy to be a part of this patients care.  Adria Dill, PharmD PGY-1 Acute Care Resident  09/28/2021 11:08 AM

## 2021-09-28 NOTE — Progress Notes (Signed)
Patient in severe pain this morning on his sacrum. Patient's skin on sacral area is broken, red, painful. One area on the mid, right buttocks that is purple with a blister. No sacral foam in place. RN applied new sacral foam. Gave patient medication. Paged the resident on call regarding skin breakdown and pain medication. RN also asked for updated order on IV fluids as patient has D5% at 169ml/hr running currently.

## 2021-09-28 NOTE — Progress Notes (Signed)
Family Medicine Teaching Service Daily Progress Note Intern Pager: [phone]  Patient name: Harry Norris Medical record number: [id number] Date of birth: 21-Jul-1936 Age: 86 y.o. Gender: male  Primary Care Provider: [name], MD Consultants: GI Code Status: DNR  Pt Overview and Major Events to Date:  1/27- admitted, GI consulted  Assessment and Plan:  LECIL TAPP is a 86 y.o. male presenting with weakness and multiple falls. PMH is significant for DM, COPD, cardiomyopathy EF 35%, HLD, HTN, HLD, prostate cancer, AAA,    Weakness   Fall, in the setting of anemia, dehydration and hypotension Patient weakness is stable today. PT saw patient and appreciates he's able to transfer and ambulate without assistance, however still complaining of generalized weakness. PT recommending HHPT at d/c.  -PT/OT  -Orthostatic vitals -Avoid antihypertensives -Continue mIVF -Trend CBC   Renal insufficiency (improving) Patient receiving mIVF with bicarb. UOP ysterday 1.4 L with 1 unmeasured occurrence. Cr today 3.82, down fro 5.04 yesterday, with BUN 68, down from 81 yesterday. Will continue IV rehydration. Kidneys appear to be responding well to IVF with resolving Cr. -Nephrology following, appreciate recs -Monitor for UOP -Continue mIVF -Daily BMP -Monitor Cr  Concern for Diverticulitis Diverticulitis noted on CT, zosyn started yesterday.  -Continue zosyn   Anemia   FOBT + Hgb 8 today down from 9.1 yesterday. GI consulted, patient refusing EGD. Will continue to monitor. Iron studies: Iron 50, UIBC 175, TIBC 225, saturation 22.  -Pantoprazole 40 mg IV BID -Hold ASA, naproxen -Repeat CBC in am    Hypoglycemia, Hx of DM (resolved) CBG stable ranging from 101-138 since patient restarted diet. Hgb A1c 5.8, patient prediabetic. -D/c mIVF w/ dextrose and HCO3 -LR mIVF @ 100 cc/hr -CBG qac & qhs  Dehydration in setting of diarrhea   Orthostasis No diarrhea since admitted. Thought  to be 2/2 abx use for pneumonia/flu -continue mIVF -C. Diff test if diarrhea returns -Orthostatic BP   Concern for Pneumonia   Concern for Sinusits s/p azithromycin & CFTX Afebrile overnight, with good O2 saturations and RR, and normal WBC.  -Continue to monitor fever curve/resp status   Sacral wound   Rt Grt toenail Patient complaining of pain today, tylenol not helping. -Encourage changing positions q3-4h -Wound care consult -Oxy 5 mg x 1    Thrombocytopenia (resolved) PLT 162 today (143), No evidence of bleeding at this time -AM CBC -Continue to monitor  Irregular EKG On tele this am, monitors showing a.fib -Continue to monitor -Continue telemetry -Continue SCD's, holding anticoagulation in setting of GI bleed.  FEN/GI: Soft diet PPx: SCD's Dispo:Home in 2-3 days.    Subjective:  Patient wanting to leave today, unsure why he's in hospital, also complaining of pain with sacral pressure wounds.  Objective: Temp:  [97.3 F (36.3 C)-98 F (36.7 C)] 97.3 F (36.3 C) (01/29 0839) Pulse Rate:  [74-84] 84 (01/29 0839) Resp:  [12-20] 18 (01/29 0839) BP: (103-137)/(48-92) 119/48 (01/29 0839) SpO2:  [95 %-99 %] 97 % (01/29 0839) Weight:  [74.1 kg] 74.1 kg (01/29 0458) Physical Exam: General: Frail, elderly, dry skin, NAD, white male Cardiovascular: RRR, NRMG Respiratory: CTABL Abdomen: Soft, NTTP, non-distended Extremities: Moving all extremities independently, no edema  Laboratory: Recent Labs  Lab 09/26/21 1125 09/26/21 1147 09/27/21 0146 09/28/21 0254  WBC 7.7  --  6.9 5.7  HGB 9.2* 9.5* 9.1* 8.0*  HCT 27.7* 28.0* 27.8* 24.7*  PLT 141*  --  143* 162   Recent Labs  Lab 09/26/21 1125  09/26/21 1147 09/27/21 0146 09/28/21 0254  NA 130* 130* 136   137 136  K 3.8 3.9 3.7   3.7 3.6  CL 96*  --  103   104 102  CO2 17*  --  15*   16* 24  BUN 86*  --  81*   80* 68*  CREATININE 5.89*  --  5.04*   5.06* 3.82*  CALCIUM 8.0*  --  7.8*   7.7* 7.5*  PROT 5.5*   --  5.0*  --   BILITOT 0.7  --  0.5  --   ALKPHOS 38  --  35*  --   ALT 46*  --  37  --   AST 33  --  27  --   GLUCOSE 93  --  66*   66* 130*      Imaging/Diagnostic Tests:   Holley Bouche, MD 09/28/2021, 9:26 AM PGY-1, [place] Intern pager: [phone], text pages welcome

## 2021-09-28 NOTE — Progress Notes (Signed)
Subjective:  1400 UOP recorded-  crt trending better -  decub noted by nursing on sacrum-  he sis not sleep andis agitated-  threatening to leave AMA  Objective Vital signs in last 24 hours: Vitals:   09/27/21 1804 09/27/21 2101 09/28/21 0458 09/28/21 0839  BP: 103/87 (!) 137/92 (!) 118/53 (!) 119/48  Pulse: 74 84 77 84  Resp: 12 18 18 18   Temp: 97.9 F (36.6 C) 97.9 F (36.6 C) (!) 97.5 F (36.4 C) (!) 97.3 F (36.3 C)  TempSrc: Oral Oral Oral Oral  SpO2: 95% 98% 99% 97%  Weight:   74.1 kg   Height:       Weight change: 2.7 kg  Intake/Output Summary (Last 24 hours) at 09/28/2021 1129 Last data filed at 09/28/2021 0900 Gross per 24 hour  Intake 1942.57 ml  Output 950 ml  Net 992.57 ml    Assessment/Plan: 86 year old WM multiple medical issues -  normal renal function 50 days ago-  now presents with AKI in the setting of low BP on losartan 1.Renal- AKI over the last 50 days in the setting of likely volume depletion with diarrhea and hypotension on losartan.  Imaging does not show obstruction.  Is on continuous  IVF - BP is reasonable at present- holding all antihypertensives.   urine bland-  seeing improvement in daily labs.   No acute indications for RRT-  hopeful that this will turn around given normal renal function not that long ago -  so far trending better with IVF-  I am going to stop IVF as he is taking in PO's-  I suspect that renal function will continue to improve-  would NOT resume losartan at discharge or ever  2. Hypertension/volume  - no edema-  hypotensive-  getting fluid repleted -  better, will stop IVF 3. ID-  imaging showing PNA-  started on rocephin and azithro per primary team  4. Anemia  - hgb has dropped from 12/4 as well from 12.6 to 9.1 to 8.0-  heme positive -  diarrhea-  iron low, will replete and will give one dose of ESA 5. Hypokalemia-  will give 40 PO today again  Anticipate continued renal recovery-  renal will sign off -  call with questions       Harry Norris    Labs: Basic Metabolic Panel: Recent Labs  Lab 09/26/21 1125 09/26/21 1147 09/27/21 0146 09/28/21 0254  NA 130* 130* 136   137 136  K 3.8 3.9 3.7   3.7 3.6  CL 96*  --  103   104 102  CO2 17*  --  15*   16* 24  GLUCOSE 93  --  66*   66* 130*  BUN 86*  --  81*   80* 68*  CREATININE 5.89*  --  5.04*   5.06* 3.82*  CALCIUM 8.0*  --  7.8*   7.7* 7.5*  PHOS  --   --  5.0* 2.6   Liver Function Tests: Recent Labs  Lab 09/26/21 1125 09/27/21 0146 09/28/21 0254  AST 33 27  --   ALT 46* 37  --   ALKPHOS 38 35*  --   BILITOT 0.7 0.5  --   PROT 5.5* 5.0*  --   ALBUMIN 2.6* 2.3*   2.3* 2.1*   Recent Labs  Lab 09/26/21 1125  LIPASE 49   No results for input(s): AMMONIA in the last 168 hours. CBC: Recent Labs  Lab 09/26/21 1125  09/26/21 1147 09/27/21 0146 09/28/21 0254  WBC 7.7  --  6.9 5.7  NEUTROABS 5.0  --   --   --   HGB 9.2* 9.5* 9.1* 8.0*  HCT 27.7* 28.0* 27.8* 24.7*  MCV 88.8  --  88.8 88.5  PLT 141*  --  143* 162   Cardiac Enzymes: No results for input(s): CKTOTAL, CKMB, CKMBINDEX, TROPONINI in the last 168 hours. CBG: Recent Labs  Lab 09/27/21 1121 09/27/21 1656 09/27/21 2100 09/27/21 2110 09/28/21 0754  GLUCAP 138* 101* 108* 124* 119*    Iron Studies:  Recent Labs    09/26/21 1415 09/28/21 0254  IRON 62 50  TIBC 270 225*  FERRITIN 650*  --    Studies/Results: CT ABDOMEN PELVIS WO CONTRAST  Result Date: 09/26/2021 CLINICAL DATA:  Acute abdominal pain with fall. EXAM: CT ABDOMEN AND PELVIS WITHOUT CONTRAST TECHNIQUE: Multidetector CT imaging of the abdomen and pelvis was performed following the standard protocol without IV contrast. RADIATION DOSE REDUCTION: This exam was performed according to the departmental dose-optimization program which includes automated exposure control, adjustment of the mA and/or kV according to patient size and/or use of iterative reconstruction technique. COMPARISON:  Plain film  07/29/2021. CTA of the abdomen and pelvis of 06/06/2021. FINDINGS: Lower chest: Centrilobular emphysema. Bibasilar patchy airspace disease with areas of cylindrical bronchiectasis. No basilar pulmonary contusion or pneumothorax. 6 mm left lower lobe pulmonary nodule on 16/4 is likely new since the prior. Normal heart size without pericardial or pleural effusion. Lad coronary artery calcification. Hepatobiliary: Normal liver. Normal gallbladder, without biliary ductal dilatation. Pancreas: Normal, without mass or ductal dilatation. Spleen: Normal in size, without focal abnormality. Adrenals/Urinary Tract: Normal adrenal glands. Bilateral renal cortical thinning. Within both kidneys, there are renal lesions of varying complexity. Maximally 2.7 cm in the interpolar right kidney which is most consistent with a minimally complex cyst. Other lesions measure greater than fluid density, including at 1.7 cm in the interpolar right kidney on 36/3, and scattered throughout the left kidney including at 1.8 cm in the interpolar region on 41/3. When comparing to the CTA of 06/06/2021, some demonstrate possible enhancement, including the above 1.7 cm interpolar right renal lesion. No hydronephrosis.  No bladder calculi. Stomach/Bowel: Tiny hiatal hernia. Extensive colonic diverticulosis. Edema is seen adjacent to the distal descending colon including on 49/3. Normal terminal ileum and appendix. Minimal small bowel extends into an area of ventral pelvic wall laxity on 60/3, similar. Vascular/Lymphatic: Advanced aortic and branch vessel atherosclerosis. Infrarenal abdominal aortic aneurysm of 3.5 cm is similar to on the prior exam. No surrounding hemorrhage. A retroaortic left renal vein. No abdominopelvic adenopathy. Reproductive: Radiation seeds in the prostate. Other: No significant free fluid. Small fat containing right inguinal hernia. No free intraperitoneal air. Musculoskeletal: Lumbosacral spondylosis. S shaped thoracolumbar  spine curvature. Right L1 transverse process lucency on 27/3 is similar and most likely developmental. IMPRESSION: 1. No posttraumatic deformity identified. 2. New areas of bibasilar airspace disease since 06/06/2021. Concurrent cylindrical bronchiectasis. Most consistent with infection or aspiration of indeterminate acuity. 3. Non complicated descending diverticulitis. 4. Nonobstructive small bowel extends minimally into an area of ventral pelvic wall laxity. 5. Bilateral renal lesions of varying complexity, incompletely characterized on this noncontrast exam. These could represent hemorrhagic cysts or solid neoplasms. Consider further evaluation with outpatient pre and post contrast abdominal MRI. If this 86 year old patient is not a good MRI candidate, renal protocol CT at 6 months suggested. 6.  Tiny hiatal hernia. 7. Aortic atherosclerosis (ICD10-I70.0), coronary  artery atherosclerosis and emphysema (ICD10-J43.9). 8. 6 mm left lower lobe pulmonary nodule which is likely new since the prior. Non-contrast chest CT at 6-12 months is recommended. If the nodule is stable at time of repeat CT, then future CT at 18-24 months (from today's scan) is considered optional for low-risk patients, but is recommended for high-risk patients. This recommendation follows the consensus statement: Guidelines for Management of Incidental Pulmonary Nodules Detected on CT Images: From the Fleischner Society 2017; Radiology 2017; 284:228-243. Electronically Signed   By: Abigail Miyamoto M.D.   On: 09/26/2021 14:10   CT Head Wo Contrast  Result Date: 09/26/2021 CLINICAL DATA:  Trauma EXAM: CT HEAD WITHOUT CONTRAST TECHNIQUE: Contiguous axial images were obtained from the base of the skull through the vertex without intravenous contrast. RADIATION DOSE REDUCTION: This exam was performed according to the departmental dose-optimization program which includes automated exposure control, adjustment of the mA and/or kV according to patient  size and/or use of iterative reconstruction technique. COMPARISON:  None. FINDINGS: Brain: No acute intracranial findings are seen. There are no signs of bleeding. Cavum septum pellucidum and cavum septum vergae are seen. There is no shift of midline structures. Cortical sulci are prominent. Vascular: There are scattered arterial calcifications. Skull: No fracture is seen. Sinuses/Orbits: There is small air-fluid level in sphenoid sinus. Mucosal thickening is seen in the ethmoid and maxillary sinuses. There is fluid density in the mastoid air cells on both sides. There is no break in the cortical margins in the mastoids. There is suggestion of previous cataract surgery in both optic globes. Other: None IMPRESSION: No acute intracranial findings are seen.  Atrophy. Chronic ethmoid and maxillary sinusitis. Small air-fluid level is seen in the sphenoid sinus suggesting possible acute sinusitis. There is fluid density in mastoid air cells on both sides suggesting mastoid effusions or chronic mastoiditis. Electronically Signed   By: Elmer Picker M.D.   On: 09/26/2021 13:58   CT Cervical Spine Wo Contrast  Result Date: 09/26/2021 CLINICAL DATA:  Facial trauma, blunt force injury in a male at age 21. EXAM: CT CERVICAL SPINE WITHOUT CONTRAST TECHNIQUE: Multidetector CT imaging of the cervical spine was performed without intravenous contrast. Multiplanar CT image reconstructions were also generated. RADIATION DOSE REDUCTION: This exam was performed according to the departmental dose-optimization program which includes automated exposure control, adjustment of the mA and/or kV according to patient size and/or use of iterative reconstruction technique. COMPARISON:  None FINDINGS: Alignment: Mild straightening of normal cervical lordotic curvature. Mild anterolisthesis of C3 on C4 and C4 on C5. Skull base and vertebrae: No evidence of acute fracture. Increased basin into dens interval is just above normal range at 11  mm but without associated soft tissue swelling. There is degenerative change at C1-C2. C1 shows normal relationship to the occipital condyles. Degenerative changes at the tip of the dens and around the dens are noted. Soft tissues and spinal canal: No prevertebral fluid or swelling. No visible canal hematoma. Disc levels: Multilevel degenerative changes with disc space narrowing and facet arthropathy likely explains mild anterolisthesis of C3 on C4 and C4 on C5. RIGHT-sided C2-3 facet fusion is demonstrated. Moderate to marked facet arthropathy seen elsewhere in the cervical spine. Upper chest: Negative. Other: None IMPRESSION: 1. No acute fracture or traumatic malalignment in the cervical spine. 2. Increased basilar and dens interval, just above normal with associated degenerative changes at C1-2 and at the tip of the dens favored to represent sequela of remote trauma and or degenerative process.  In the absence of priors for comparison, if there is high clinical suspicion for ligamentous injury MRI could be helpful for further assessment. 3. Multilevel degenerative changes with disc space narrowing and facet arthropathy likely explains mild anterolisthesis of C3 on C4 and C4 on C5. Electronically Signed   By: Zetta Bills M.D.   On: 09/26/2021 14:26   DG Chest Portable 1 View  Result Date: 09/26/2021 CLINICAL DATA:  A male at age 82 4 presents for evaluation of foot injury and fall with low blood pressure. EXAM: PORTABLE CHEST 1 VIEW COMPARISON:  July 30, 2021. FINDINGS: EKG leads project over the chest. Trachea midline. Cardiomediastinal contours and hilar structures are stable. There is bibasilar airspace disease with more linear appearance over the RIGHT as compared to the LEFT hemidiaphragm. Increased airspace process compared to more remote priors at the LEFT lung base and similar appearance as compared to prior studies with respect to RIGHT lower lobe findings. No visible pneumothorax. On limited  assessment there is no acute skeletal finding. IMPRESSION: Bibasilar airspace disease likely atelectasis given morphology, likely associated with scarring at the RIGHT lung base based on prior imaging. Given increased airspace disease at the LEFT lung base however compared to prior studies would correlate with any signs of pulmonary infection. Electronically Signed   By: Zetta Bills M.D.   On: 09/26/2021 11:45   DG Foot Complete Right  Result Date: 09/26/2021 CLINICAL DATA:  Right hallux injury, fall EXAM: RIGHT FOOT COMPLETE - 3+ VIEW COMPARISON:  None. FINDINGS: No acute fracture or dislocation. Pes cavus alignment. Mild hallux valgus. Small erosion with well-defined sclerotic margin along the medial aspect of the first metatarsal head, likely sequela of crystalline arthropathy such as gout. Small plantar calcaneal spur. Soft tissue swelling over the dorsum of the forefoot. Atherosclerotic vascular calcifications are present. IMPRESSION: 1. No acute fracture or dislocation. Soft tissue swelling over the dorsum of the forefoot. 2. Mild hallux valgus. Electronically Signed   By: Davina Poke D.O.   On: 09/26/2021 11:51   CT Maxillofacial Wo Contrast  Result Date: 09/26/2021 CLINICAL DATA:  In 86 year old male presents for evaluation of trauma. EXAM: CT MAXILLOFACIAL WITHOUT CONTRAST TECHNIQUE: Multidetector CT imaging of the maxillofacial structures was performed. Multiplanar CT image reconstructions were also generated. RADIATION DOSE REDUCTION: This exam was performed according to the departmental dose-optimization program which includes automated exposure control, adjustment of the mA and/or kV according to patient size and/or use of iterative reconstruction technique. COMPARISON:  Head CT of the same date. FINDINGS: Osseous: No fracture or mandibular dislocation. No destructive process. Orbits: Negative. No traumatic or inflammatory finding. Sinuses: Mucosal thickening in the maxillary sinuses.  Small amount of debris in the dependent sphenoid sinus on the RIGHT. Scattered ethmoid mucosal thickening. No air-fluid levels in the sinuses. Incidental note is made of bilateral chronic mastoid effusions not substantially changed since imaging dating back to 2016 Soft tissues: Negative. Limited intracranial: No significant or unexpected finding. IMPRESSION: 1. No signs of acute facial bone fracture. 2. Sinus disease and chronic mastoid effusions. Electronically Signed   By: Zetta Bills M.D.   On: 09/26/2021 15:00   Medications: Infusions:  piperacillin-tazobactam (ZOSYN)  IV     sodium bicarbonate 150 mEq in D5W infusion 100 mL/hr at 09/27/21 1147    Scheduled Medications:  oxyCODONE  5 mg Oral Once   pantoprazole (PROTONIX) IV  40 mg Intravenous Q12H    have reviewed scheduled and prn medications.  Physical Exam: General: pale,  conversational-  talking to son on phone-  wants to go home  Heart: RRR Lungs: clear Abdomen: soft, non tender Extremities: no edema    09/28/2021,11:29 AM  LOS: 2 days

## 2021-09-29 ENCOUNTER — Other Ambulatory Visit (HOSPITAL_COMMUNITY): Payer: Self-pay

## 2021-09-29 LAB — RENAL FUNCTION PANEL
Albumin: 2.1 g/dL — ABNORMAL LOW (ref 3.5–5.0)
Anion gap: 8 (ref 5–15)
BUN: 58 mg/dL — ABNORMAL HIGH (ref 8–23)
CO2: 24 mmol/L (ref 22–32)
Calcium: 7.8 mg/dL — ABNORMAL LOW (ref 8.9–10.3)
Chloride: 106 mmol/L (ref 98–111)
Creatinine, Ser: 3.28 mg/dL — ABNORMAL HIGH (ref 0.61–1.24)
GFR, Estimated: 18 mL/min — ABNORMAL LOW (ref >60)
Glucose, Bld: 91 mg/dL (ref 70–99)
Phosphorus: 2.4 mg/dL — ABNORMAL LOW (ref 2.5–4.6)
Potassium: 4.4 mmol/L (ref 3.5–5.1)
Sodium: 138 mmol/L (ref 135–145)

## 2021-09-29 LAB — CBC
HCT: 24.9 % — ABNORMAL LOW (ref 39.0–52.0)
Hemoglobin: 8.3 g/dL — ABNORMAL LOW (ref 13.0–17.0)
MCH: 29.7 pg (ref 26.0–34.0)
MCHC: 33.3 g/dL (ref 30.0–36.0)
MCV: 89.2 fL (ref 80.0–100.0)
Platelets: 162 10*3/uL (ref 150–400)
RBC: 2.79 MIL/uL — ABNORMAL LOW (ref 4.22–5.81)
RDW: 17.5 % — ABNORMAL HIGH (ref 11.5–15.5)
WBC: 5.5 10*3/uL (ref 4.0–10.5)
nRBC: 0 % (ref 0.0–0.2)

## 2021-09-29 LAB — GLUCOSE, CAPILLARY
Glucose-Capillary: 73 mg/dL (ref 70–99)
Glucose-Capillary: 83 mg/dL (ref 70–99)
Glucose-Capillary: 86 mg/dL (ref 70–99)

## 2021-09-29 MED ORDER — NAPHAZOLINE-GLYCERIN 0.012-0.2 % OP SOLN: 1.0000 [drp] | Freq: Four times a day (QID) | OPHTHALMIC | 0 refills | Status: DC | PRN

## 2021-09-29 MED ORDER — AMOXICILLIN-POT CLAVULANATE 500-125 MG PO TABS: 1.0000 | ORAL_TABLET | Freq: Two times a day (BID) | ORAL | 0 refills | Status: AC

## 2021-09-29 MED ORDER — PANTOPRAZOLE SODIUM 40 MG PO TBEC: 40.0000 mg | DELAYED_RELEASE_TABLET | Freq: Two times a day (BID) | ORAL | Status: DC

## 2021-09-29 MED ORDER — LIDOCAINE 5 % EX PTCH
1.0000 | MEDICATED_PATCH | Freq: Every day | CUTANEOUS | Status: DC
  Administered 2021-09-29: 1 via TRANSDERMAL
  Filled 2021-09-29: qty 1

## 2021-09-29 MED ORDER — PANTOPRAZOLE SODIUM 40 MG PO TBEC
40.0000 mg | DELAYED_RELEASE_TABLET | Freq: Two times a day (BID) | ORAL | 1 refills | Status: DC
  Filled 2021-09-29: qty 60, 30d supply, fill #0

## 2021-09-29 MED ORDER — PANTOPRAZOLE SODIUM 40 MG PO TBEC: 40.0000 mg | DELAYED_RELEASE_TABLET | Freq: Two times a day (BID) | ORAL | 1 refills | Status: DC

## 2021-09-29 MED ORDER — AMOXICILLIN-POT CLAVULANATE 500-125 MG PO TABS
1.0000 | ORAL_TABLET | Freq: Two times a day (BID) | ORAL | Status: DC
  Filled 2021-09-29 (×2): qty 1

## 2021-09-29 MED ORDER — AMOXICILLIN-POT CLAVULANATE 500-125 MG PO TABS
1.0000 | ORAL_TABLET | Freq: Two times a day (BID) | ORAL | 0 refills | Status: DC
  Filled 2021-09-29: qty 6, 3d supply, fill #0

## 2021-09-29 MED ORDER — MUPIROCIN CALCIUM 2 % EX CREA
TOPICAL_CREAM | Freq: Every day | CUTANEOUS | Status: DC
  Filled 2021-09-29: qty 15

## 2021-09-29 MED ORDER — AMOXICILLIN-POT CLAVULANATE 500-125 MG PO TABS
1.0000 | ORAL_TABLET | Freq: Two times a day (BID) | ORAL | 0 refills | Status: DC
  Filled 2021-09-29: qty 7, 4d supply, fill #0

## 2021-09-29 NOTE — TOC Initial Note (Addendum)
Transition of Care [facility]) - Initial/Assessment Note    Patient Details  Name: Harry Norris MRN: [id number] Date of Birth: 1936/02/20  [facility] CM/SW Contact:    Tom-Johnson, Renea Ee, RN Phone Number: 09/29/2021, 4:53 PM  Clinical Narrative:                  CM consulted for home health PT/OT. Prior to talking with patient, CM got a chat message from MD stating son is concerned about patient going home alone with his short term memory loss. Per Roney [place], PT's response, she echoed  son's concerns. States "an optimal situation would be getting home with 24 hour assist at least initially; 24 hour assist might be a stretch for his family".  CM talked with SW about best disposition. Patient will need 24 hr supervision at home and family will have to come together and arrange for a family member to be with him at all times or pay private/ out of pocket for a private aide. The other option is to go to ALF. MD reached out to son and son to respond after 5 pm today per MD through secure chat.  Due to the time son will be responding, CM referred MD to ER SW/CM if any need arise and cannot wait till tomorrow.     Barriers to Discharge: Other (must enter comment) (Family thinks patient not safe togohome alone. Patient not qualified for SNF, family to either take patient home and arrange 24 hr care either private pay or family come together tomak it happen. Awaiting on Son's response per MD.)   Patient Goals and CMS Choice Patient states their goals for this hospitalization and ongoing recovery are:: To return home CMS Medicare.gov Compare Post Acute Care list provided to:: Patient Choice offered to / list presented to : Patient, Adult Children  Expected Discharge Plan and Services   In-house Referral: Clinical Social Work Discharge Planning Services: CM Consult   Living arrangements for the past 2 months: Single Family Home Expected Discharge Date: 09/29/21                                     Prior Living Arrangements/Services Living arrangements for the past 2 months: Single Family Home Lives with:: Self Patient language and need for interpreter reviewed:: Yes Do you feel safe going back to the place where you live?: Yes      Need for Family Participation in Patient Care: Yes (Comment) Care giver support system in place?: Yes (comment)   Criminal Activity/Legal Involvement Pertinent to Current Situation/Hospitalization: No - Comment as needed  Activities of Daily Living Home Assistive Devices/Equipment: Walker (specify type), Dentures (specify type), Eyeglasses ADL Screening (condition at time of admission) Patient's cognitive ability adequate to safely complete daily activities?: Yes Is the patient deaf or have difficulty hearing?: Yes Does the patient have difficulty seeing, even when wearing glasses/contacts?: No Does the patient have difficulty concentrating, remembering, or making decisions?: Yes Patient able to express need for assistance with ADLs?: Yes Does the patient have difficulty dressing or bathing?: Yes Independently performs ADLs?: No Communication: Independent Dressing (OT): Needs assistance Is this a change from baseline?: Change from baseline, expected to last >3 days Grooming: Needs assistance Is this a change from baseline?: Change from baseline, expected to last >3 days Feeding: Independent Bathing: Needs assistance Is this a change from baseline?: Change from baseline, expected to  last >3 days Toileting: Needs assistance Is this a change from baseline?: Change from baseline, expected to last >3days In/Out Bed: Needs assistance Is this a change from baseline?: Change from baseline, expected to last >3 days Walks in Home: Needs assistance Is this a change from baseline?: Change from baseline, expected to last >3 days Does the patient have difficulty walking or climbing stairs?: Yes Weakness of Legs: Both Weakness  of Arms/Hands: Both  Permission Sought/Granted Permission sought to share information with : Case Manager, Customer service manager, Family Supports Permission granted to share information with : Yes, Verbal Permission Granted              Emotional Assessment Appearance:: Appears stated age Attitude/Demeanor/Rapport: Engaged Affect (typically observed): Accepting, Appropriate, Calm Orientation: : Oriented to Self, Oriented to Place, Oriented to Situation Alcohol / Substance Use: Not Applicable Psych Involvement: No (comment)  Admission diagnosis:  Dehydration [E86.0] Renal cyst [N28.1] Renal insufficiency [N28.9] Pulmonary nodule [R91.1] NSTEMI (non-ST elevated myocardial infarction) ([place]) [I21.4] Generalized weakness [R53.1] Gastrointestinal hemorrhage, unspecified gastrointestinal hemorrhage type [K92.2] Community acquired pneumonia, unspecified laterality [J18.9] Patient Active Problem List   Diagnosis Date Noted   Diarrhea    Diverticulitis    Blood in the stool    Renal insufficiency 09/26/2021   Pressure injury of skin 07/30/2021   Respiratory failure ([place]) 07/29/2021   Influenza 07/29/2021   Bradycardia 12/19/2018   Orthostatic hypotension 12/19/2018   Fatigue 12/12/2018   Hyperlipidemia 02/07/2018   Asthma 02/07/2018   Chronic rhinitis 02/26/2016   Cardiomyopathy ([place]) 12/18/2015   Chronic venous insufficiency 12/18/2015   LBBB (left bundle branch block) 12/18/2015   Syncope 12/18/2015   Hypoxemia 04/10/2014   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 [place] [address] Phone: [phone] Fax: [place], [place], [place] [address] Phone: [phone] Fax: [phone]  [facility] ([facility] ) - [place], [address] [place] [address] Phone: [phone] Fax: [phone]  [facility] [address] [place] [address] Phone: [phone] Fax: [phone]     Social Determinants of Health (SDOH) Interventions    Readmission Risk Interventions No flowsheet data found.

## 2021-09-29 NOTE — Progress Notes (Signed)
Family Medicine Teaching Service Daily Progress Note Intern Pager: [phone]  Patient name: Harry Norris Medical record number: [id number] Date of birth: 01/27/1936 Age: 86 y.o. Gender: male  Primary Care Provider: [name], MD Consultants: Nephrology  Code Status: DNR  Pt Overview and Major Events to Date:  09/28/21  -  Patient admitted  Assessment and Plan:  Harry Norris is a 86 y.o. male presenting with weakness and multiple falls, found to have a GI bleed. PMH is significant for DM, COPD, cardiomyopathy EF 35%, HLD, HTN, HLD, prostate cancer, AAA,    Weakness   Fall, in the setting of anemia, dehydration and hypotension Patient with no complaints of weakness today. Patient working well with OT, recommending HHPT. Patient with No significant change in orthostatic vitals BP 135-50 @ 0 min standing, 128/50 @ 3 min standing -PT/OT -Avoid antihypertensives   AKI   Renal insufficiency (improving) Pt d/c'd from IVF yesterday with improved PO. Will monitor. Cr 3.28 today from 3.82 yesterday, GFR improved to 18 from 15 yesterday, UOP 1.2 L, with 3 unmeasured occurrences.  -Encourage PO fluids -Continue to monitor -AM BMP -Monitor UOP  Concern for Diverticulitis Patient with no abdominal complaints, no infectious symptoms or fevers.  -d/c zosyn (Day 2/ 5),  -start Augmentin for 3 days, for total treatment course of 5 days.   Anemia   GI Bleed Hgb stable today at 8.3, yesterday 8.0.  -Continue Protonix 40 mg PO BID -Hold ASA, naproxen -Daily CBC  Dehydration in setting of diarrhea   Orthostasis (resolved) Patient with improved PO IVF d/c'd. 360 PO, nearly 2 L In, 1.2 L Out with 3 unmeasured occurrences.  -Continue to monitor fluid status  Sacral wound   Rt Grt toenail   Back pain Patient complaining of back pain overnight, received lidocaine patch and k-pad.  -Continue lidocaine patch -Continue K-pad -Monitor wounds  Irregular EKG Continue  telemetery  Concern for Pneumonia   Concern for Sinusits s/p azithromycin & CFTX Stable on RA, normal WBC, afebrile  FEN/GI: Soft PPx: PPX Dispo:Home today.    Subjective:  No complaints of weakness or pain this morning  Objective: Temp:  [97.3 F (36.3 C)-98.2 F (36.8 C)] 97.7 F (36.5 C) (01/30 0533) Pulse Rate:  [79-91] 91 (01/30 0533) Resp:  [18] 18 (01/30 0533) BP: (98-140)/(48-96) 118/55 (01/30 0533) SpO2:  [94 %-98 %] 95 % (01/30 0533) Physical Exam: General: Well appearing, good affect, NAD, white male Cardiovascular: RRR, [place] Respiratory: CTABL Abdomen: Soft, NTTP, mildly distended Extremities: Moving all extremities independently, no edema  Laboratory: Recent Labs  Lab 09/27/21 0146 09/28/21 0254 09/29/21 0247  WBC 6.9 5.7 5.5  HGB 9.1* 8.0* 8.3*  HCT 27.8* 24.7* 24.9*  PLT 143* 162 162   Recent Labs  Lab 09/26/21 1125 09/26/21 1147 09/27/21 0146 09/28/21 0254 09/29/21 0247  NA 130*   < > 136   137 136 138  K 3.8   < > 3.7   3.7 3.6 4.4  CL 96*  --  103   104 102 106  CO2 17*  --  15*   16* 24 24  BUN 86*  --  81*   80* 68* 58*  CREATININE 5.89*  --  5.04*   5.06* 3.82* 3.28*  CALCIUM 8.0*  --  7.8*   7.7* 7.5* 7.8*  PROT 5.5*  --  5.0*  --   --   BILITOT 0.7  --  0.5  --   --  ALKPHOS 38  --  35*  --   --   ALT 46*  --  37  --   --   AST 33  --  27  --   --   GLUCOSE 93  --  66*   66* 130* 91   < > = values in this interval not displayed.      Imaging/Diagnostic Tests:   Holley Bouche, MD 09/29/2021, 5:36 AM PGY-1, [place] Intern pager: [phone], text pages welcome

## 2021-09-29 NOTE — Progress Notes (Addendum)
FPTS Interim Progress Note  S:Paged by RN that patient has back pain. Saw patient at bedside and was uncomfortable in bed asking to stand up. No tenderness to palpation on examination/no obvious abnormalities. No abdominal pain but does have distended abdomen.   O: BP (!) 125/48 (BP Location: Right Arm)    Pulse 88    Temp 97.6 F (36.4 C) (Oral)    Resp 18    Ht 5\' 9"  (1.753 m)    Wt 74.1 kg    SpO2 95%    BMI 24.12 kg/m     A/P: -lidocaine patch -encouraged repositioning -monitor pain -K pad  Gerrit Heck, MD 09/29/2021, 2:31 AM PGY-1, [place] Medicine Service pager [phone]

## 2021-09-29 NOTE — Consult Note (Addendum)
[place] Nurse Consult Note: Reason for Consult: Consult requested for sacrum/buttocks and right toe. Wound type: Left buttocks with Deep tissue pressure injury; dark purple intact blister; 1X1cm Right buttock with Stage 2 pressure injury, .5X.5X.1cm red and dry Sacrum with Stage 2 pressure injury; .5X.5X.1cm, red and dry Right great toe tip with full thickness wound where previous nail fell off; 1X1X.2cm, red and moist Pressure Injury POA: Yes Dressing procedure/placement/frequency: Topical treatment order provided for bedside nurses to perform as follows: Apply Bactroban to right great toe wound Q day, then cover with foam dressing.  (Change foam dressing Q 3 days or PRN soiling.) 2. Foam dressing to buttocks/sacrum, change Q 3 days or PRN soiling Please re-consult if further assistance is needed.  Thank-you,  Julien Girt MSN, [place], [place], [place], [place]

## 2021-09-29 NOTE — Progress Notes (Signed)
Physical Therapy Treatment Patient Details Name: Harry Norris MRN: [id number] DOB: 1935-12-15 Today's Date: 09/29/2021   History of Present Illness 86 y.o. male presents to [facility] hospital on 09/26/2021 with weakness and multiple falls. Pt noted to be hypotensive and dehydrated on admission. PMH includes DM, COPD, cardiomyopathy EF 35%, HLD, HTN, HLD, prostate cancer.    PT Comments    Continuing work on functional mobility and activity tolerance;  Session focused on progressive amb, as well as obtaining Orthostatic BPs; Orthostatics overall stable (See vitals flow sheet.); Min guard assist with and without physical contact for amb in hallway; Lots of cues for posture with little consistent correction; Notable that pt did not remember walking in the hallway on Saturday with evaluating PT;   With these short-term memory deficits, and family concerns re: safety of pt at home alone, we need to re-think his dc plan; Going home with at or near 24 hour assist will be optimal -- I'm just unsure that family can arrange for around the clock help on short notice; I wonder if pt going home to a son's home would be an option at all;   It's time to consider a more sustainable and safe longer-term living situation for Harry Norris; a situation like Assisted Living, increasing family assist, etc; this can take time to set up;   At this time, it is worth considering SNF for short-term rehab to work on strength, posture, and endurance, and allow for more time for the family to arrange for a safer living situation; Discussed with TOC RN, and pt's bedside RN    Recommendations for follow up therapy are one component of a multi-disciplinary discharge planning process, led by the attending physician.  Recommendations may be updated based on patient status, additional functional criteria and insurance authorization.  Follow Up Recommendations  Other (comment)     Assistance Recommended at Discharge Frequent or  constant Supervision/Assistance  Patient can return home with the following A little help with walking and/or transfers;Assistance with cooking/housework;Direct supervision/assist for medications management;Direct supervision/assist for financial management;Help with stairs or ramp for entrance   Equipment Recommendations  None recommended by PT (pt owns necessary DME)    Recommendations for Other Services       Precautions / Restrictions Precautions Precautions: Fall Precaution Comments: monitor BP; Orthostatic BPs overall stable, with diastolics on the low side     Mobility  Bed Mobility                    Transfers Overall transfer level: Needs assistance Equipment used: Rolling walker (2 wheels) Transfers: Sit to/from Stand Sit to Stand: Min guard           General transfer comment: Close guard for safety    Ambulation/Gait Ambulation/Gait assistance: Min guard (with and without physical contact ) Gait Distance (Feet): 110 Feet Assistive device: Rolling walker (2 wheels) Gait Pattern/deviations: Step-through pattern, Trunk flexed       General Gait Details: pt with slowed step-through gait, increased turnk flexion. PT provides cues for improved upright posture with minimal correction from patient   Stairs             Wheelchair Mobility    Modified Rankin (Stroke Patients Only)       Balance     Sitting balance-Leahy Scale: Good       Standing balance-Leahy Scale: Fair  Cognition Arousal/Alertness: Awake/alert Behavior During Therapy: WFL for tasks assessed/performed Overall Cognitive Status: Impaired/Different from baseline Area of Impairment: Memory, Safety/judgement, Awareness                     Memory: Decreased short-term memory   Safety/Judgement: Decreased awareness of safety, Decreased awareness of deficits Awareness: Intellectual   General Comments: Did not recall   walking with evaluating PT on this past Saturday; Responds/corrects posture and RW positioning with cues -- but drifts back to RW too far and flexed posture within about 20 seconds; Family has concerns for his safety being alone at home        Exercises      General Comments General comments (skin integrity, edema, etc.): Orthostatics in vitals flowsheets      Pertinent Vitals/Pain Pain Assessment Pain Assessment: No/denies pain    Home Living                          Prior Function            PT Goals (current goals can now be found in the care plan section) Acute Rehab PT Goals Patient Stated Goal: wants to go home today PT Goal Formulation: With patient Time For Goal Achievement: 10/11/21 Potential to Achieve Goals: Good Progress towards PT goals: Progressing toward goals    Frequency    Min 3X/week      PT Plan Discharge plan needs to be updated    Co-evaluation              AM-PAC PT "6 Clicks" Mobility   Outcome Measure  Help needed turning from your back to your side while in a flat bed without using bedrails?: None Help needed moving from lying on your back to sitting on the side of a flat bed without using bedrails?: A Little Help needed moving to and from a bed to a chair (including a wheelchair)?: A Little Help needed standing up from a chair using your arms (e.g., wheelchair or bedside chair)?: A Little Help needed to walk in hospital room?: A Little Help needed climbing 3-5 steps with a railing? : A Little 6 Click Score: 19    End of Session Equipment Utilized During Treatment: Gait belt Activity Tolerance: Patient tolerated treatment well Patient left: in chair;with call bell/phone within reach;with chair alarm set Nurse Communication: Mobility status;Other (comment) (and considerations for dc) PT Visit Diagnosis: Muscle weakness (generalized) (M62.81)     Time: 7673-4193 PT Time Calculation (min) (ACUTE ONLY): 30  min  Charges:  $Gait Training: 8-22 mins $Therapeutic Activity: 8-22 mins                     Roney Marion, PT  Acute Rehabilitation Services Pager [phone] Office [place] 09/29/2021, 12:32 PM

## 2021-09-29 NOTE — Progress Notes (Signed)
Nutrition Brief Note  Patient identified on the Malnutrition Screening Tool (MST) Report.  Admitting Dx: Dehydration [E86.0] Renal cyst [N28.1] Renal insufficiency [N28.9] Pulmonary nodule [R91.1] NSTEMI (non-ST elevated myocardial infarction) ([place]) [I21.4] Generalized weakness [R53.1] Gastrointestinal hemorrhage, unspecified gastrointestinal hemorrhage type [K92.2] Community acquired pneumonia, unspecified laterality [J18.9] PMH:  Past Medical History:  Diagnosis Date   AAA (abdominal aortic aneurysm)    Anemia    Asthma 02/07/2018   Benign neoplasm of colon    Bradycardia 12/19/2018   Bronchitis    Cancer ([place])    prostate   Cardiomyopathy ([place]) 12/18/2015   Carotid artery occlusion    Chronic rhinitis 02/26/2016   Chronic venous insufficiency 12/18/2015   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   PFTs 01/18/14:  FeV1 1.58 45% FVC 2.0 50% FeV1/FVC 79% Fef 25-75 795 ( 40% improved after BD) CXR: Copd changes ONO 03/07/14 desats to <88% on RA    Diverticulosis of colon (without mention of hemorrhage)    DM type 2 (diabetes mellitus, type 2) ([place])    Fatigue 12/12/2018   Hyperlipidemia    Hypertension    Hypertensive heart disease    Hypoxemia 04/10/2014   Overview:  Last Assessment & Plan:  Cont nocturnal oxygen therapy 2L    Kidney stone    LBBB (left bundle branch block) 12/18/2015   Leg swelling 11/28/2013   Orthostatic hypotension 12/19/2018   Other primary cardiomyopathies    Other symptoms involving cardiovascular system 11/28/2013   Prostate cancer ([place])    prostate    Syncope 12/18/2015   Unspecified venous (peripheral) insufficiency    Varicose veins    Varicose veins of lower extremities with other complications 02/24/349   Medications:  Scheduled Meds:  lidocaine  1 patch Transdermal Daily   melatonin  5 mg Oral QHS   pantoprazole (PROTONIX) IV  40 mg Intravenous Q12H  Continuous Infusions:  ferric gluconate (FERRLECIT) IVPB Stopped (09/28/21 1448)   lactated  ringers 100 mL/hr at 09/29/21 0400   piperacillin-tazobactam (ZOSYN)  IV 3.375 g (09/29/21 0504)   Labs: Recent Labs  Lab 09/27/21 0146 09/28/21 0254 09/29/21 0247  NA 136   137 136 138  K 3.7   3.7 3.6 4.4  CL 103   104 102 106  CO2 15*   16* 24 24  BUN 81*   80* 68* 58*  CREATININE 5.04*   5.06* 3.82* 3.28*  CALCIUM 7.8*   7.7* 7.5* 7.8*  PHOS 5.0* 2.6 2.4*  GLUCOSE 66*   66* 130* 91    Wt Readings from Last 15 Encounters:  09/28/21 74.1 kg  08/03/21 73.7 kg  07/01/21 79.4 kg  06/06/21 81.6 kg  12/02/20 82.1 kg  11/29/19 82.3 kg  04/25/19 84.4 kg  03/22/19 84.8 kg  12/19/18 82.6 kg  12/12/18 82.7 kg  10/18/18 83.8 kg  06/28/18 87 kg  04/05/18 88.2 kg  03/01/18 88.5 kg  09/28/17 89.4 kg    Body mass index is 24.12 kg/m. Patient meets criteria for normal based on current BMI.   Current diet order is soft, patient is consuming approximately 90-100% of meals at this time.   Nephrology has signed off with anticipation for continued improvement of renal function. Pt c/o back/neck pain and is anxious to discharge home. Per RN, pt has been threatening to leave AMA. Pt marked for discharge today. No nutrition interventions warranted at this time. If nutrition issues arise, please consult RD.    Theone Stanley., MS, RD, LDN (she/her/hers)  RD pager number and weekend/on-call pager number located in [place].

## 2021-09-29 NOTE — Progress Notes (Signed)
Pt initially reported that his back felt better after the Lidocaine patch was applied.  However, he has been very restless and requesting to get up to the recliner and then back to bed after 30 minutes a couple of times.  PRN Tylenol repeated.  Awaiting heating pad from [employer], but unable to apply it over Lidocaine patch.  Pt states he sleeps in a recliner at home "for his breathing" but isn't able to get comfortable in the hospital recliner.  He states he is anxious to get home, stating that "my back hurts from being here".   Ayesha Mohair BSN RN [place] 09/29/2021, 7:03 AM

## 2021-09-29 NOTE — Hospital Course (Addendum)
Harry Norris is a 86 y.o. male presenting with weakness and multiple falls. PMH is significant for DM, COPD, cardiomyopathy EF 35%, HLD, HTN, HLD, prostate cancer, AAA,    Weakness   Fall Patient complaining of weakness and multiple falls. Patient was at home in the kitchen, lost balance, and fell, prompting family to bring him to the ED. Patient has had multiple falls in the last few months, which family attribute to dehydration. Family notes patient hasn't been eating and drinking much. On arrival to ED patient has low DBP with vitals of 111/35, pulse 71, RR 18, sating well on 2 L [place]. In the ED patient was started on azithromycin and ceftriaxone for suspected pneumonia. Labs were significant for a positive positive FOBT , Hgb of 9.2, PLT 141, Na 130, a Cr 5.89, BUN 86, GFR 9, ALT 46, anion gap 17. Trop was 18, with a BNP 118.8. CXR showed bibasilar airspace disease with possible left lung base pulmonary infection.  Foot x-ray showed no fracture or dislocation with soft tissue swelling over the dorsum of the foot. CT head showed chronic maxillary and ethmoid sinusitis, with acute sphenoid sinusitis.  CT maxillofacial showed sinus disease with no acute fracture.CT cervical spine showed no acute fracture or traumatic malalignment in the cervical spine.  With mild multilevel degenerative changes.  CT abdomen showed bibasilar airspace disease most consistent with infection or aspiration of indeterminate acuity, descending diverticulitis, bilateral renal lesions of varying complexity concerning for hemorrhagic cyst or solid neoplasms. CT abd Pelv also showed tiny hiatal hernia, and 6 mm pulmonary nodule in LLL. EKG was notable for irregular rhthm with prolonged qrs interval and no discernable P-waves. Patient was rehydrated with IVF and had normal orthostatic vitals by day of discharge. Patient worked with PT/OT and was determined to have good independence needing a little help with ADL's, recommending home with  home health PT. By discharge, patient had no complaints of weakness and felt well.   Renal insufficiency Patient Cr was elevated to 5.89 on admission, with his baseline Cr being 0.9 in 12/22. Nephrotoxic medication was held and Nephrology was consulted/followed patient. Patient was treated with mIVF and Cr improved. By discharge Cr had improved. Nephrology recommended discontinuing home losartan permanently for patient.    Anemia   FOBT + Patient presented with Hgb 9.5 with a positive FOBT. Patient hgb dropped from 9.5 to 8.0 during admission, concerning for a GI bleed. ASA and Naproxen home meds were held. GI was consulted and spoke with patient about performing EGD. Patient declined the EGD. Patient was started on Protonix gtt, and discharged on oral Protonix. Patient Hgb was stable at 8.3 by day of discharge.    Concern for Diverticulitis Patient had diverticulitis noted on CT abdomen pelvis, without signs or symptoms during admission. Patient was treated for suspected diverticulitis with  Zosyn and then transitioned to Augmentin. Patient discharged home on 3 days of Augmentin, to complete a 5 day course of antibiotics.     Concern for Pneumonia   Concern for Sinusits  Patient had concern for new bibasilar airspace disease concerning for possible pneumonia on CT Abd Pelv. Patient had recent history of Pneumonia and Flu. He was treated with a 1 time dose of Azithromycin & Ceftriaxone. Patient did not have any respiratory symptoms, and remained afebrile and with normal WBC, thus the decision was made to not continue ABX. Patient was stable with no respiratory symptoms by discharge.   Dehydration in setting of diarrhea   Orthostasis  Patient notably dehydrated on physical exam when admitted to hospital. Patient with poor PO intake over the last few days prior to admission. Patient had had Flu and pneumonia a few weeks ago, and was treated with antibiotics. According to son, he's had diarrhea for  two weeks, following antibiotic use. Patient was neagtive for C. Diff. Patient had no diarrhea while admitted. By discharge, patient had improved hydration status and was stable for home discharge.    Sacral wound   Rt Grt toenail Patient has sacral pressure ulcer that is stage 1-2, with skin break down, erythematous base, and blistering on right buttock and left buttock. Patient also had toenail removed on right great toe, that was injured during his fall. Wound care was consulted and wounds were cared for during admission. Wound care recommended Bactroban be applied to toe daily, and dressing changes for sacral wound every 3 days or whenever bandage soiled. Wounds were stable by discharge.   Memory concern Son of patient noticed that patient has had short term memory issues since his Flu/Pneumonia sickness. Son of patient notes that his long term memory is fine, but short term is lacking. Both sons of patient concerned for patients ability to care for self at home, alone.    Issues for follow up D/c losartan permanently, per Nephrology Avoid NSAIDs, for bleeding risk Monitor Hgb for risk of GI bleed Avoid antihypertensives and diuretic for low BP, reconsider in outpatient setting Monitor Toe and sacral wounds Talk to family about patient's memory, family concerned that patient is loosing short term memory and will have problems caring for himself Follow up bilateral renal lesion with pre/post contrast abdominal MRI or renal CT protocol at 6 months Follow up 6 mm LLL pulmonary nodule with non contrast CT at 6-12 months

## 2021-09-29 NOTE — Care Management Important Message (Signed)
Important Message  Patient Details  Name: Harry Norris MRN: [id number] Date of Birth: 1935-09-16   Medicare Important Message Given:  Yes     Vishaal Strollo 09/29/2021, 2:09 PM

## 2021-09-30 ENCOUNTER — Other Ambulatory Visit (HOSPITAL_COMMUNITY): Payer: Self-pay

## 2021-09-30 DIAGNOSIS — W19XXXA Unspecified fall, initial encounter: Secondary | ICD-10-CM | POA: Diagnosis not present

## 2021-09-30 DIAGNOSIS — E860 Dehydration: Secondary | ICD-10-CM | POA: Diagnosis not present

## 2021-09-30 DIAGNOSIS — E861 Hypovolemia: Secondary | ICD-10-CM | POA: Diagnosis not present

## 2021-09-30 DIAGNOSIS — D509 Iron deficiency anemia, unspecified: Secondary | ICD-10-CM | POA: Diagnosis not present

## 2021-09-30 DIAGNOSIS — E876 Hypokalemia: Secondary | ICD-10-CM | POA: Diagnosis not present

## 2021-09-30 DIAGNOSIS — N179 Acute kidney failure, unspecified: Secondary | ICD-10-CM | POA: Diagnosis not present

## 2021-09-30 DIAGNOSIS — E1159 Type 2 diabetes mellitus with other circulatory complications: Secondary | ICD-10-CM | POA: Diagnosis not present

## 2021-09-30 DIAGNOSIS — R413 Other amnesia: Secondary | ICD-10-CM | POA: Diagnosis not present

## 2021-09-30 DIAGNOSIS — J449 Chronic obstructive pulmonary disease, unspecified: Secondary | ICD-10-CM | POA: Diagnosis not present

## 2021-09-30 DIAGNOSIS — I9589 Other hypotension: Secondary | ICD-10-CM | POA: Diagnosis not present

## 2021-09-30 DIAGNOSIS — Y92009 Unspecified place in unspecified non-institutional (private) residence as the place of occurrence of the external cause: Secondary | ICD-10-CM | POA: Diagnosis not present

## 2021-09-30 DIAGNOSIS — R531 Weakness: Secondary | ICD-10-CM | POA: Diagnosis not present

## 2021-09-30 LAB — LEGIONELLA PNEUMOPHILA SEROGP 1 UR AG: L. pneumophila Serogp 1 Ur Ag: NEGATIVE

## 2021-09-30 NOTE — Discharge Summary (Signed)
[facility] Discharge Summary  Patient name: Harry Norris Medical record number: [id number] Date of birth: 1936/06/24 Age: 86 y.o. Gender: male Date of Admission: 09/26/2021  Date of Discharge: 09/29/21 Admitting Physician: Harry Key, DO  Primary Care Provider: [name], MD Consultants: GI, Nephrology, Wound care  Indication for Hospitalization: Weakness  Discharge Diagnoses/Problem List:  Weakness   Fall Dehydration in setting of diarrhea   Orthostasis Renal insufficiency Anemia   FOBT + Concern for Pneumonia   Concern for Sinusits  Concern for Diverticulitis Sacral wound   Rt Grt toenail Memory concern  Disposition: Home  Discharge Condition: Improved  Discharge Exam:  Temp:  [97.3 F (36.3 C)-98.2 F (36.8 C)] 97.7 F (36.5 C) (01/30 0533) Pulse Rate:  [79-91] 91 (01/30 0533) Resp:  [18] 18 (01/30 0533) BP: (98-140)/(48-96) 118/55 (01/30 0533) SpO2:  [94 %-98 %] 95 % (01/30 0533) Physical Exam: General: Well appearing, good affect, NAD, white male Cardiovascular: RRR, [place] Respiratory: CTABL Abdomen: Soft, NTTP, mildly distended Extremities: Moving all extremities independently, no edema  Brief Hospital Course:  Harry Norris is a 86 y.o. male presenting with weakness and multiple falls. PMH is significant for DM, COPD, cardiomyopathy EF 35%, HLD, HTN, HLD, prostate cancer, AAA,    Weakness   Fall Patient complaining of weakness and multiple falls. Patient was at home in the kitchen, lost balance, and fell, prompting family to bring him to the ED. Patient has had multiple falls in the last few months, which family attribute to dehydration. Family notes patient hasn't been eating and drinking much. On arrival to ED patient has low DBP with vitals of 111/35, pulse 71, RR 18, sating well on 2 L [place]. In the ED patient was started on azithromycin and ceftriaxone for suspected pneumonia. Labs were significant for a positive  positive FOBT , Hgb of 9.2, PLT 141, Na 130, a Cr 5.89, BUN 86, GFR 9, ALT 46, anion gap 17. Trop was 18, with a BNP 118.8. CXR showed bibasilar airspace disease with possible left lung base pulmonary infection.  Foot x-ray showed no fracture or dislocation with soft tissue swelling over the dorsum of the foot. CT head showed chronic maxillary and ethmoid sinusitis, with acute sphenoid sinusitis.  CT maxillofacial showed sinus disease with no acute fracture.CT cervical spine showed no acute fracture or traumatic malalignment in the cervical spine.  With mild multilevel degenerative changes.  CT abdomen showed bibasilar airspace disease most consistent with infection or aspiration of indeterminate acuity, descending diverticulitis, bilateral renal lesions of varying complexity concerning for hemorrhagic cyst or solid neoplasms. CT abd Pelv also showed tiny hiatal hernia, and 6 mm pulmonary nodule in LLL. EKG was notable for irregular rhthm with prolonged qrs interval and no discernable P-waves. Patient was rehydrated with IVF and had normal orthostatic vitals by day of discharge. Patient worked with PT/OT and was determined to have good independence needing a little help with ADL's, recommending home with home health PT. By discharge, patient had no complaints of weakness and felt well.   Renal insufficiency Patient Cr was elevated to 5.89 on admission, with his baseline Cr being 0.9 in 12/22. Nephrotoxic medication was held and Nephrology was consulted/followed patient. Patient was treated with mIVF and Cr improved. By discharge Cr had improved. Nephrology recommended discontinuing home losartan permanently for patient.    Anemia   FOBT + Patient presented with Hgb 9.5 with a positive FOBT. Patient hgb dropped from 9.5 to 8.0 during  admission, concerning for a GI bleed. ASA and Naproxen home meds were held. GI was consulted and spoke with patient about performing EGD. Patient declined the EGD. Patient was  started on Protonix gtt, and discharged on oral Protonix. Patient Hgb was stable at 8.3 by day of discharge.    Concern for Diverticulitis Patient had diverticulitis noted on CT abdomen pelvis, without signs or symptoms during admission. Patient was treated for suspected diverticulitis with  Zosyn and then transitioned to Augmentin. Patient discharged home on 3 days of Augmentin, to complete a 5 day course of antibiotics.     Concern for Pneumonia   Concern for Sinusits  Patient had concern for new bibasilar airspace disease concerning for possible pneumonia on CT Abd Pelv. Patient had recent history of Pneumonia and Flu. He was treated with a 1 time dose of Azithromycin & Ceftriaxone. Patient did not have any respiratory symptoms, and remained afebrile and with normal WBC, thus the decision was made to not continue ABX. Patient was stable with no respiratory symptoms by discharge.   Dehydration in setting of diarrhea   Orthostasis Patient notably dehydrated on physical exam when admitted to hospital. Patient with poor PO intake over the last few days prior to admission. Patient had had Flu and pneumonia a few weeks ago, and was treated with antibiotics. According to son, he's had diarrhea for two weeks, following antibiotic use. Patient was neagtive for C. Diff. Patient had no diarrhea while admitted. By discharge, patient had improved hydration status and was stable for home discharge.    Sacral wound   Rt Grt toenail Patient has sacral pressure ulcer that is stage 1-2, with skin break down, erythematous base, and blistering on right buttock and left buttock. Patient also had toenail removed on right great toe, that was injured during his fall. Wound care was consulted and wounds were cared for during admission. Wound care recommended Bactroban be applied to toe daily, and dressing changes for sacral wound every 3 days or whenever bandage soiled. Wounds were stable by discharge.   Memory  concern Son of patient noticed that patient has had short term memory issues since his Flu/Pneumonia sickness. Son of patient notes that his long term memory is fine, but short term is lacking. Both sons of patient concerned for patients ability to care for self at home, alone.    Issues for follow up D/c losartan permanently, per Nephrology Avoid NSAIDs, for bleeding risk Monitor Hgb for risk of GI bleed Avoid antihypertensives and diuretic for low BP, reconsider in outpatient setting Monitor Toe and sacral wounds Talk to family about patient's memory, family concerned that patient is loosing short term memory and will have problems caring for himself Follow up bilateral renal lesion with pre/post contrast abdominal MRI or renal CT protocol at 6 months Follow up 6 mm LLL pulmonary nodule with non contrast CT at 6-12 months   Significant Procedures:  None  Significant Labs and Imaging:  Recent Labs  Lab 09/27/21 0146 09/28/21 0254 09/29/21 0247  WBC 6.9 5.7 5.5  HGB 9.1* 8.0* 8.3*  HCT 27.8* 24.7* 24.9*  PLT 143* 162 162   Recent Labs  Lab 09/26/21 1125 09/26/21 1147 09/27/21 0146 09/28/21 0254 09/29/21 0247  NA 130* 130* 136   137 136 138  K 3.8 3.9 3.7   3.7 3.6 4.4  CL 96*  --  103   104 102 106  CO2 17*  --  15*   16* 24 24  GLUCOSE  93  --  66*   66* 130* 91  BUN 86*  --  81*   80* 68* 58*  CREATININE 5.89*  --  5.04*   5.06* 3.82* 3.28*  CALCIUM 8.0*  --  7.8*   7.7* 7.5* 7.8*  PHOS  --   --  5.0* 2.6 2.4*  ALKPHOS 38  --  35*  --   --   AST 33  --  27  --   --   ALT 46*  --  37  --   --   ALBUMIN 2.6*  --  2.3*   2.3* 2.1* 2.1*      Results/Tests Pending at Time of Discharge:  None  Discharge Medications:  Allergies as of 09/29/2021       Reactions   Ace Inhibitors Cough        Medication List     STOP taking these medications    arformoterol 15 MCG/2ML Nebu Commonly known as: BROVANA   aspirin EC 81 MG tablet   carvedilol 6.25 MG  tablet Commonly known as: COREG   furosemide 40 MG tablet Commonly known as: LASIX   naproxen 500 MG tablet Commonly known as: NAPROSYN   oxymetazoline 0.05 % nasal spray Commonly known as: AFRIN   revefenacin 175 MCG/3ML nebulizer solution Commonly known as: YUPELRI       TAKE these medications    albuterol 108 (90 Base) MCG/ACT inhaler Commonly known as: VENTOLIN HFA Inhale 2 puffs into the lungs every 6 (six) hours as needed for wheezing or shortness of breath.   amoxicillin-clavulanate 500-125 MG tablet Commonly known as: Augmentin Take 1 tablet (500 mg total) by mouth 2 (two) times daily for 4 days.   atorvastatin 40 MG tablet Commonly known as: LIPITOR Take 40 mg by mouth at bedtime.   Centrum Silver 50+Men Tabs Take 1 tablet by mouth daily with breakfast.   fenofibrate 160 MG tablet Take 160 mg by mouth daily.   Fish Oil 1000 MG Caps Take 1,000 mg by mouth 2 (two) times daily.   Flovent HFA 110 MCG/ACT inhaler Generic drug: fluticasone Inhale 2 puffs into the lungs 2 (two) times daily.   formoterol 20 MCG/2ML nebulizer solution Commonly known as: PERFOROMIST Take 20 mcg by nebulization 2 (two) times daily.   Melatonin 10 MG Tabs Take 1 tablet by mouth at bedtime.   naphazoline-glycerin 0.012-0.2 % Soln Commonly known as: CLEAR EYES REDNESS Place 1-2 drops into both eyes 4 (four) times daily as needed for eye irritation.   OXYGEN Inhale 2 L/min into the lungs as needed (for shortness of breath).   pantoprazole 40 MG tablet Commonly known as: PROTONIX Take 1 tablet (40 mg total) by mouth 2 (two) times daily.   vitamin C 500 MG tablet Commonly known as: ASCORBIC ACID Take 500 mg by mouth daily.   vitamin E 180 MG (400 UNITS) capsule Take 400 Units by mouth daily.        Discharge Instructions: Please refer to Patient Instructions section of EMR for full details.  Patient was counseled important signs and symptoms that should prompt return  to medical care, changes in medications, dietary instructions, activity restrictions, and follow up appointments.   Follow-Up Appointments:   Holley Bouche, MD 09/30/2021, 2:05 PM PGY-1, [place]

## 2021-10-01 LAB — CULTURE, BLOOD (ROUTINE X 2)
Culture: NO GROWTH
Culture: NO GROWTH
Special Requests: ADEQUATE
Special Requests: ADEQUATE

## 2021-10-02 ENCOUNTER — Other Ambulatory Visit: Payer: Self-pay | Admitting: *Deleted

## 2021-10-03 DIAGNOSIS — I255 Ischemic cardiomyopathy: Secondary | ICD-10-CM | POA: Diagnosis not present

## 2021-10-03 DIAGNOSIS — K5790 Diverticulosis of intestine, part unspecified, without perforation or abscess without bleeding: Secondary | ICD-10-CM | POA: Diagnosis not present

## 2021-10-03 DIAGNOSIS — J45909 Unspecified asthma, uncomplicated: Secondary | ICD-10-CM | POA: Diagnosis not present

## 2021-10-03 DIAGNOSIS — E871 Hypo-osmolality and hyponatremia: Secondary | ICD-10-CM | POA: Diagnosis not present

## 2021-10-03 DIAGNOSIS — J439 Emphysema, unspecified: Secondary | ICD-10-CM | POA: Diagnosis not present

## 2021-10-03 DIAGNOSIS — L988 Other specified disorders of the skin and subcutaneous tissue: Secondary | ICD-10-CM | POA: Diagnosis not present

## 2021-10-03 DIAGNOSIS — Z7951 Long term (current) use of inhaled steroids: Secondary | ICD-10-CM | POA: Diagnosis not present

## 2021-10-03 DIAGNOSIS — I714 Abdominal aortic aneurysm, without rupture, unspecified: Secondary | ICD-10-CM | POA: Diagnosis not present

## 2021-10-03 DIAGNOSIS — J9622 Acute and chronic respiratory failure with hypercapnia: Secondary | ICD-10-CM | POA: Diagnosis not present

## 2021-10-03 DIAGNOSIS — I119 Hypertensive heart disease without heart failure: Secondary | ICD-10-CM | POA: Diagnosis not present

## 2021-10-03 DIAGNOSIS — D696 Thrombocytopenia, unspecified: Secondary | ICD-10-CM | POA: Diagnosis not present

## 2021-10-03 DIAGNOSIS — I951 Orthostatic hypotension: Secondary | ICD-10-CM | POA: Diagnosis not present

## 2021-10-03 DIAGNOSIS — Z87891 Personal history of nicotine dependence: Secondary | ICD-10-CM | POA: Diagnosis not present

## 2021-10-03 DIAGNOSIS — Z7982 Long term (current) use of aspirin: Secondary | ICD-10-CM | POA: Diagnosis not present

## 2021-10-03 DIAGNOSIS — E119 Type 2 diabetes mellitus without complications: Secondary | ICD-10-CM | POA: Diagnosis not present

## 2021-10-03 DIAGNOSIS — J310 Chronic rhinitis: Secondary | ICD-10-CM | POA: Diagnosis not present

## 2021-10-03 DIAGNOSIS — J9601 Acute respiratory failure with hypoxia: Secondary | ICD-10-CM | POA: Diagnosis not present

## 2021-10-03 DIAGNOSIS — J9621 Acute and chronic respiratory failure with hypoxia: Secondary | ICD-10-CM | POA: Diagnosis not present

## 2021-10-03 DIAGNOSIS — E785 Hyperlipidemia, unspecified: Secondary | ICD-10-CM | POA: Diagnosis not present

## 2021-10-03 DIAGNOSIS — N179 Acute kidney failure, unspecified: Secondary | ICD-10-CM | POA: Diagnosis not present

## 2021-10-03 DIAGNOSIS — D649 Anemia, unspecified: Secondary | ICD-10-CM | POA: Diagnosis not present

## 2021-10-03 DIAGNOSIS — Z9981 Dependence on supplemental oxygen: Secondary | ICD-10-CM | POA: Diagnosis not present

## 2021-10-03 NOTE — Patient Instructions (Signed)
Visit Information  Thank you for taking time to visit with me today. Please don't hesitate to contact me if I can be of assistance to you before our next scheduled telephone appointment.  Following are the goals we discussed today:  Provided patient with basic written and verbal COPD education on self care/management/and exacerbation prevention Advised patient to track and manage COPD triggers Provided instruction about proper use of medications used for management of COPD including inhalers Advised patient to self assesses COPD action plan zone and make appointment with provider if in the yellow zone for 48 hours without improvement Advised patient to engage in light exercise as tolerated 3-5 days a week to aid in the the management of COPD Provided education about and advised patient to utilize infection prevention strategies to reduce risk of respiratory infection Discussed the importance of adequate rest and management of fatigue with COPD

## 2021-10-03 NOTE — Patient Outreach (Signed)
[facility]) Care Management Telephonic RN Care Manager Note   10/03/2021 Name:  Harry Norris MRN:  [id number] DOB:  [birth year] 86 y/o  Recommendations/Changes made from today's visit: Updates documented with the plan of care.  Subjective: Harry Norris is an 86 y.o. year old male who is a primary patient of [name], MD. The care management team was consulted for assistance with care management and/or care coordination needs.    Telephonic RN Care Manager completed Telephone Visit today.  Objective:   Medications Reviewed Today     Reviewed by [name], RN (Registered Nurse) on 10/02/21 at 86  Med List Status: <None>   Medication Order Taking? Sig Documenting Provider Last Dose Status Informant  albuterol (VENTOLIN HFA) 108 (90 Base) MCG/ACT inhaler [id number] Yes Inhale 2 puffs into the lungs every 6 (six) hours as needed for wheezing or shortness of breath. [provider] Taking Active Child  amoxicillin-clavulanate (AUGMENTIN) 500-125 MG tablet [id number] Yes Take 1 tablet (500 mg total) by mouth 2 (two) times daily for 4 days. Gerrit Heck, MD Taking Active   atorvastatin (LIPITOR) 40 MG tablet [id number] Yes Take 40 mg by mouth at bedtime. [provider] Taking Active Child  fenofibrate 160 MG tablet [id number] Yes Take 160 mg by mouth daily. [provider] Taking Active Child  FLOVENT HFA 110 MCG/ACT inhaler [id number] Yes Inhale 2 puffs into the lungs 2 (two) times daily. [provider] Taking Active Child  formoterol (PERFOROMIST) 20 MCG/2ML nebulizer solution [id number] Yes Take 20 mcg by nebulization 2 (two) times daily. [provider] Taking Active Child  Melatonin 10 MG TABS [id number] Yes Take 1 tablet by mouth at bedtime. [provider] Taking Active Child  Multiple Vitamins-Minerals (CENTRUM SILVER 50+MEN) TABS [id number] Yes Take 1 tablet by mouth daily with breakfast. [provider] Taking Active Child  naphazoline-glycerin (CLEAR EYES REDNESS) 0.012-0.2 % SOLN [id number] Yes Place 1-2 drops into both eyes 4 (four) times daily as needed for eye irritation. Gerrit Heck, MD Taking Active   Omega-3 Fatty Acids (FISH OIL) 1000 MG CAPS [id number] Yes Take 1,000 mg by mouth 2 (two) times daily. [provider] Taking Active Child  OXYGEN [id number] Yes Inhale 2 L/min into the lungs as needed (for shortness of breath). [provider] Taking Active Child  pantoprazole (PROTONIX) 40 MG tablet [id number] Yes Take 1 tablet (40 mg total) by mouth 2 (two) times daily. Gerrit Heck, MD Taking Active   vitamin C (ASCORBIC ACID) 500 MG tablet [id number] Yes Take 500 mg by mouth daily. [provider] Taking Active Child  vitamin E 400 UNIT capsule [id number] Yes Take 400 Units by mouth daily. [provider] Taking Active Child             SDOH:  (Social Determinants of Health) assessments and interventions performed:     Care Plan  Review of patient past medical history, allergies, medications, health status, including review of consultants reports, laboratory and other test data, was performed as part of comprehensive evaluation for care management services.   Care Plan : [name]  Updates made by [name], RN since 10/03/2021 12:00 AM     Problem: Knowledge Deficit related to COPD and care coordination needs   Priority: High     Long-Range Goal: Development of care for management of COPD   Start Date: 08/07/2021  Expected End Date: 11/28/2021  This Visit's Progress: On track  Recent Progress: On track  Priority: High  Note:   Current Barriers:  Knowledge Deficits related to plan of care for management of COPD   RNCM Clinical Goal(s):  Patient will verbalize understanding of plan for management of COPD as evidenced by awareness of what to do if exacerbated symptoms are encountered. continue  to work with RN Care Manager to address care management and care coordination needs related to  COPD as evidenced by adherence to CM Team Scheduled appointments through collaboration with RN Care manager, provider, and care team.   Interventions: Inter-disciplinary care team collaboration (see longitudinal plan of care) Evaluation of current treatment plan related to  self management and patient's adherence to plan as established by provider   COPD Interventions:  (Status:  Goal on track:  Yes.) Long Term Goal Provided patient with basic written and verbal COPD education on self care/management/and exacerbation prevention Advised patient to track and manage COPD triggers Provided instruction about proper use of medications used for management of COPD including inhalers Advised patient to self assesses COPD action plan zone and make appointment with provider if in the yellow zone for 48 hours without improvement Advised patient to engage in light exercise as tolerated 3-5 days a week to aid in the the management of COPD Provided education about and advised patient to utilize infection prevention strategies to reduce risk of respiratory infection Discussed the importance of adequate rest and management of fatigue with COPD .  2/2/202 Updates: pt with recent hospitalization and d/c on 1/30. Pt has followed up with his provider Dr. Delena Bali on 1/31 pending other appointments that have been reviewed. Spoke with both the pt and his primary caregiver son Jori Moll) who verified all medication changes and adherence to all medications prescribed. Pt recovering well with no acute needs at this time. Caregiver states previous [facility] services are planned to resume with PT/OT and caregivers plan to place pt into ALF [facility] in [place]). States that have already visited the facility for placement however pending paperwork and pending other possible benefits pt may have access to receiving with  the [place]. RN discussed life alert if the process will take awhile due to pt's ongoing weakness and son states pt needs someone with his at all times for adherence with his medications. Denies any issues with his COPD and pt continue to all inhalers and only a few days left on the antibiotics provider for the ordered 4 days. Pt remains in the GREEN zone with no acute breathing problems and pt only using his home O2 via 2 liters PRN. No other needs presented as pt continue to request monthly follow up calls instead of weekly transition of care. Will follow up in a few weeks concerning pt's progress with the ALF placement process.    Patient Goals/Self-Care Activities: Take all medications as prescribed Attend all scheduled provider appointments Call pharmacy for medication refills 3-7 days in advance of running out of medications Perform all self care activities independently  Perform IADL's (shopping, preparing meals, housekeeping, managing finances) independently Call provider office for new concerns or questions   Update 09/02/2021 Pt doing well with no acute issues on his birthday today. Pt breathing well with no related incidents or difficulty breathing. Pt continue to take all medications to prevent acute events. Reiterated on the COPD action zone as pt remains in the GREEN zone with no acute issues. Pt remains aware of who to call with any acute symptoms and has a good support system available.  Follow Up Plan:  Telephone follow up appointment with care management team member scheduled for:  Feb 2023 The patient has been provided with contact information for the care management team and has been advised to call with any health related questions or concerns.       Raina Mina, RN Care Management Coordinator [place] Office [phone]

## 2021-10-06 DIAGNOSIS — J9601 Acute respiratory failure with hypoxia: Secondary | ICD-10-CM | POA: Diagnosis not present

## 2021-10-06 DIAGNOSIS — J439 Emphysema, unspecified: Secondary | ICD-10-CM | POA: Diagnosis not present

## 2021-10-06 DIAGNOSIS — J449 Chronic obstructive pulmonary disease, unspecified: Secondary | ICD-10-CM | POA: Diagnosis not present

## 2021-10-07 ENCOUNTER — Ambulatory Visit: Payer: Medicare Other | Admitting: *Deleted

## 2021-10-07 DIAGNOSIS — J439 Emphysema, unspecified: Secondary | ICD-10-CM | POA: Diagnosis not present

## 2021-10-07 DIAGNOSIS — I255 Ischemic cardiomyopathy: Secondary | ICD-10-CM | POA: Diagnosis not present

## 2021-10-07 DIAGNOSIS — J9601 Acute respiratory failure with hypoxia: Secondary | ICD-10-CM | POA: Diagnosis not present

## 2021-10-07 DIAGNOSIS — E119 Type 2 diabetes mellitus without complications: Secondary | ICD-10-CM | POA: Diagnosis not present

## 2021-10-07 DIAGNOSIS — J45909 Unspecified asthma, uncomplicated: Secondary | ICD-10-CM | POA: Diagnosis not present

## 2021-10-07 DIAGNOSIS — D649 Anemia, unspecified: Secondary | ICD-10-CM | POA: Diagnosis not present

## 2021-10-07 DIAGNOSIS — L988 Other specified disorders of the skin and subcutaneous tissue: Secondary | ICD-10-CM | POA: Diagnosis not present

## 2021-10-07 DIAGNOSIS — I119 Hypertensive heart disease without heart failure: Secondary | ICD-10-CM | POA: Diagnosis not present

## 2021-10-07 DIAGNOSIS — K5790 Diverticulosis of intestine, part unspecified, without perforation or abscess without bleeding: Secondary | ICD-10-CM | POA: Diagnosis not present

## 2021-10-07 DIAGNOSIS — Z87891 Personal history of nicotine dependence: Secondary | ICD-10-CM | POA: Diagnosis not present

## 2021-10-07 DIAGNOSIS — J9621 Acute and chronic respiratory failure with hypoxia: Secondary | ICD-10-CM | POA: Diagnosis not present

## 2021-10-07 DIAGNOSIS — J9622 Acute and chronic respiratory failure with hypercapnia: Secondary | ICD-10-CM | POA: Diagnosis not present

## 2021-10-07 DIAGNOSIS — Z7951 Long term (current) use of inhaled steroids: Secondary | ICD-10-CM | POA: Diagnosis not present

## 2021-10-07 DIAGNOSIS — I951 Orthostatic hypotension: Secondary | ICD-10-CM | POA: Diagnosis not present

## 2021-10-07 DIAGNOSIS — Z9981 Dependence on supplemental oxygen: Secondary | ICD-10-CM | POA: Diagnosis not present

## 2021-10-07 DIAGNOSIS — E871 Hypo-osmolality and hyponatremia: Secondary | ICD-10-CM | POA: Diagnosis not present

## 2021-10-07 DIAGNOSIS — D696 Thrombocytopenia, unspecified: Secondary | ICD-10-CM | POA: Diagnosis not present

## 2021-10-07 DIAGNOSIS — E785 Hyperlipidemia, unspecified: Secondary | ICD-10-CM | POA: Diagnosis not present

## 2021-10-07 DIAGNOSIS — J310 Chronic rhinitis: Secondary | ICD-10-CM | POA: Diagnosis not present

## 2021-10-07 DIAGNOSIS — Z7982 Long term (current) use of aspirin: Secondary | ICD-10-CM | POA: Diagnosis not present

## 2021-10-07 DIAGNOSIS — I714 Abdominal aortic aneurysm, without rupture, unspecified: Secondary | ICD-10-CM | POA: Diagnosis not present

## 2021-10-07 DIAGNOSIS — N179 Acute kidney failure, unspecified: Secondary | ICD-10-CM | POA: Diagnosis not present

## 2021-10-08 DIAGNOSIS — K5790 Diverticulosis of intestine, part unspecified, without perforation or abscess without bleeding: Secondary | ICD-10-CM | POA: Diagnosis not present

## 2021-10-08 DIAGNOSIS — J310 Chronic rhinitis: Secondary | ICD-10-CM | POA: Diagnosis not present

## 2021-10-08 DIAGNOSIS — D649 Anemia, unspecified: Secondary | ICD-10-CM | POA: Diagnosis not present

## 2021-10-08 DIAGNOSIS — Z7982 Long term (current) use of aspirin: Secondary | ICD-10-CM | POA: Diagnosis not present

## 2021-10-08 DIAGNOSIS — Z9981 Dependence on supplemental oxygen: Secondary | ICD-10-CM | POA: Diagnosis not present

## 2021-10-08 DIAGNOSIS — I255 Ischemic cardiomyopathy: Secondary | ICD-10-CM | POA: Diagnosis not present

## 2021-10-08 DIAGNOSIS — N179 Acute kidney failure, unspecified: Secondary | ICD-10-CM | POA: Diagnosis not present

## 2021-10-08 DIAGNOSIS — J9622 Acute and chronic respiratory failure with hypercapnia: Secondary | ICD-10-CM | POA: Diagnosis not present

## 2021-10-08 DIAGNOSIS — E785 Hyperlipidemia, unspecified: Secondary | ICD-10-CM | POA: Diagnosis not present

## 2021-10-08 DIAGNOSIS — Z7951 Long term (current) use of inhaled steroids: Secondary | ICD-10-CM | POA: Diagnosis not present

## 2021-10-08 DIAGNOSIS — E871 Hypo-osmolality and hyponatremia: Secondary | ICD-10-CM | POA: Diagnosis not present

## 2021-10-08 DIAGNOSIS — J9601 Acute respiratory failure with hypoxia: Secondary | ICD-10-CM | POA: Diagnosis not present

## 2021-10-08 DIAGNOSIS — L988 Other specified disorders of the skin and subcutaneous tissue: Secondary | ICD-10-CM | POA: Diagnosis not present

## 2021-10-08 DIAGNOSIS — J45909 Unspecified asthma, uncomplicated: Secondary | ICD-10-CM | POA: Diagnosis not present

## 2021-10-08 DIAGNOSIS — I119 Hypertensive heart disease without heart failure: Secondary | ICD-10-CM | POA: Diagnosis not present

## 2021-10-08 DIAGNOSIS — J9621 Acute and chronic respiratory failure with hypoxia: Secondary | ICD-10-CM | POA: Diagnosis not present

## 2021-10-08 DIAGNOSIS — J439 Emphysema, unspecified: Secondary | ICD-10-CM | POA: Diagnosis not present

## 2021-10-08 DIAGNOSIS — Z87891 Personal history of nicotine dependence: Secondary | ICD-10-CM | POA: Diagnosis not present

## 2021-10-08 DIAGNOSIS — I951 Orthostatic hypotension: Secondary | ICD-10-CM | POA: Diagnosis not present

## 2021-10-08 DIAGNOSIS — D696 Thrombocytopenia, unspecified: Secondary | ICD-10-CM | POA: Diagnosis not present

## 2021-10-08 DIAGNOSIS — E119 Type 2 diabetes mellitus without complications: Secondary | ICD-10-CM | POA: Diagnosis not present

## 2021-10-08 DIAGNOSIS — I714 Abdominal aortic aneurysm, without rupture, unspecified: Secondary | ICD-10-CM | POA: Diagnosis not present

## 2021-10-10 DIAGNOSIS — I119 Hypertensive heart disease without heart failure: Secondary | ICD-10-CM | POA: Diagnosis not present

## 2021-10-10 DIAGNOSIS — E785 Hyperlipidemia, unspecified: Secondary | ICD-10-CM | POA: Diagnosis not present

## 2021-10-10 DIAGNOSIS — J9601 Acute respiratory failure with hypoxia: Secondary | ICD-10-CM | POA: Diagnosis not present

## 2021-10-10 DIAGNOSIS — N179 Acute kidney failure, unspecified: Secondary | ICD-10-CM | POA: Diagnosis not present

## 2021-10-10 DIAGNOSIS — I951 Orthostatic hypotension: Secondary | ICD-10-CM | POA: Diagnosis not present

## 2021-10-10 DIAGNOSIS — J9621 Acute and chronic respiratory failure with hypoxia: Secondary | ICD-10-CM | POA: Diagnosis not present

## 2021-10-10 DIAGNOSIS — Z9981 Dependence on supplemental oxygen: Secondary | ICD-10-CM | POA: Diagnosis not present

## 2021-10-10 DIAGNOSIS — E119 Type 2 diabetes mellitus without complications: Secondary | ICD-10-CM | POA: Diagnosis not present

## 2021-10-10 DIAGNOSIS — I714 Abdominal aortic aneurysm, without rupture, unspecified: Secondary | ICD-10-CM | POA: Diagnosis not present

## 2021-10-10 DIAGNOSIS — J310 Chronic rhinitis: Secondary | ICD-10-CM | POA: Diagnosis not present

## 2021-10-10 DIAGNOSIS — Z7951 Long term (current) use of inhaled steroids: Secondary | ICD-10-CM | POA: Diagnosis not present

## 2021-10-10 DIAGNOSIS — Z87891 Personal history of nicotine dependence: Secondary | ICD-10-CM | POA: Diagnosis not present

## 2021-10-10 DIAGNOSIS — L988 Other specified disorders of the skin and subcutaneous tissue: Secondary | ICD-10-CM | POA: Diagnosis not present

## 2021-10-10 DIAGNOSIS — E871 Hypo-osmolality and hyponatremia: Secondary | ICD-10-CM | POA: Diagnosis not present

## 2021-10-10 DIAGNOSIS — I255 Ischemic cardiomyopathy: Secondary | ICD-10-CM | POA: Diagnosis not present

## 2021-10-10 DIAGNOSIS — Z7982 Long term (current) use of aspirin: Secondary | ICD-10-CM | POA: Diagnosis not present

## 2021-10-10 DIAGNOSIS — K5790 Diverticulosis of intestine, part unspecified, without perforation or abscess without bleeding: Secondary | ICD-10-CM | POA: Diagnosis not present

## 2021-10-10 DIAGNOSIS — D696 Thrombocytopenia, unspecified: Secondary | ICD-10-CM | POA: Diagnosis not present

## 2021-10-10 DIAGNOSIS — J9622 Acute and chronic respiratory failure with hypercapnia: Secondary | ICD-10-CM | POA: Diagnosis not present

## 2021-10-10 DIAGNOSIS — D649 Anemia, unspecified: Secondary | ICD-10-CM | POA: Diagnosis not present

## 2021-10-10 DIAGNOSIS — J45909 Unspecified asthma, uncomplicated: Secondary | ICD-10-CM | POA: Diagnosis not present

## 2021-10-10 DIAGNOSIS — J439 Emphysema, unspecified: Secondary | ICD-10-CM | POA: Diagnosis not present

## 2021-10-13 DIAGNOSIS — E871 Hypo-osmolality and hyponatremia: Secondary | ICD-10-CM | POA: Diagnosis not present

## 2021-10-13 DIAGNOSIS — Z7982 Long term (current) use of aspirin: Secondary | ICD-10-CM | POA: Diagnosis not present

## 2021-10-13 DIAGNOSIS — I951 Orthostatic hypotension: Secondary | ICD-10-CM | POA: Diagnosis not present

## 2021-10-13 DIAGNOSIS — J310 Chronic rhinitis: Secondary | ICD-10-CM | POA: Diagnosis not present

## 2021-10-13 DIAGNOSIS — J439 Emphysema, unspecified: Secondary | ICD-10-CM | POA: Diagnosis not present

## 2021-10-13 DIAGNOSIS — Z87891 Personal history of nicotine dependence: Secondary | ICD-10-CM | POA: Diagnosis not present

## 2021-10-13 DIAGNOSIS — Z9981 Dependence on supplemental oxygen: Secondary | ICD-10-CM | POA: Diagnosis not present

## 2021-10-13 DIAGNOSIS — E785 Hyperlipidemia, unspecified: Secondary | ICD-10-CM | POA: Diagnosis not present

## 2021-10-13 DIAGNOSIS — J9621 Acute and chronic respiratory failure with hypoxia: Secondary | ICD-10-CM | POA: Diagnosis not present

## 2021-10-13 DIAGNOSIS — D649 Anemia, unspecified: Secondary | ICD-10-CM | POA: Diagnosis not present

## 2021-10-13 DIAGNOSIS — J45909 Unspecified asthma, uncomplicated: Secondary | ICD-10-CM | POA: Diagnosis not present

## 2021-10-13 DIAGNOSIS — K5790 Diverticulosis of intestine, part unspecified, without perforation or abscess without bleeding: Secondary | ICD-10-CM | POA: Diagnosis not present

## 2021-10-13 DIAGNOSIS — L988 Other specified disorders of the skin and subcutaneous tissue: Secondary | ICD-10-CM | POA: Diagnosis not present

## 2021-10-13 DIAGNOSIS — J9601 Acute respiratory failure with hypoxia: Secondary | ICD-10-CM | POA: Diagnosis not present

## 2021-10-13 DIAGNOSIS — I255 Ischemic cardiomyopathy: Secondary | ICD-10-CM | POA: Diagnosis not present

## 2021-10-13 DIAGNOSIS — J9622 Acute and chronic respiratory failure with hypercapnia: Secondary | ICD-10-CM | POA: Diagnosis not present

## 2021-10-13 DIAGNOSIS — N179 Acute kidney failure, unspecified: Secondary | ICD-10-CM | POA: Diagnosis not present

## 2021-10-13 DIAGNOSIS — I119 Hypertensive heart disease without heart failure: Secondary | ICD-10-CM | POA: Diagnosis not present

## 2021-10-13 DIAGNOSIS — E119 Type 2 diabetes mellitus without complications: Secondary | ICD-10-CM | POA: Diagnosis not present

## 2021-10-13 DIAGNOSIS — D696 Thrombocytopenia, unspecified: Secondary | ICD-10-CM | POA: Diagnosis not present

## 2021-10-13 DIAGNOSIS — I714 Abdominal aortic aneurysm, without rupture, unspecified: Secondary | ICD-10-CM | POA: Diagnosis not present

## 2021-10-13 DIAGNOSIS — Z7951 Long term (current) use of inhaled steroids: Secondary | ICD-10-CM | POA: Diagnosis not present

## 2021-10-14 DIAGNOSIS — J439 Emphysema, unspecified: Secondary | ICD-10-CM | POA: Diagnosis not present

## 2021-10-14 DIAGNOSIS — E785 Hyperlipidemia, unspecified: Secondary | ICD-10-CM | POA: Diagnosis not present

## 2021-10-14 DIAGNOSIS — K5790 Diverticulosis of intestine, part unspecified, without perforation or abscess without bleeding: Secondary | ICD-10-CM | POA: Diagnosis not present

## 2021-10-14 DIAGNOSIS — I714 Abdominal aortic aneurysm, without rupture, unspecified: Secondary | ICD-10-CM | POA: Diagnosis not present

## 2021-10-14 DIAGNOSIS — J45909 Unspecified asthma, uncomplicated: Secondary | ICD-10-CM | POA: Diagnosis not present

## 2021-10-14 DIAGNOSIS — Z7982 Long term (current) use of aspirin: Secondary | ICD-10-CM | POA: Diagnosis not present

## 2021-10-14 DIAGNOSIS — L988 Other specified disorders of the skin and subcutaneous tissue: Secondary | ICD-10-CM | POA: Diagnosis not present

## 2021-10-14 DIAGNOSIS — D696 Thrombocytopenia, unspecified: Secondary | ICD-10-CM | POA: Diagnosis not present

## 2021-10-14 DIAGNOSIS — D649 Anemia, unspecified: Secondary | ICD-10-CM | POA: Diagnosis not present

## 2021-10-14 DIAGNOSIS — I119 Hypertensive heart disease without heart failure: Secondary | ICD-10-CM | POA: Diagnosis not present

## 2021-10-14 DIAGNOSIS — N179 Acute kidney failure, unspecified: Secondary | ICD-10-CM | POA: Diagnosis not present

## 2021-10-14 DIAGNOSIS — Z7951 Long term (current) use of inhaled steroids: Secondary | ICD-10-CM | POA: Diagnosis not present

## 2021-10-14 DIAGNOSIS — E119 Type 2 diabetes mellitus without complications: Secondary | ICD-10-CM | POA: Diagnosis not present

## 2021-10-14 DIAGNOSIS — Z9981 Dependence on supplemental oxygen: Secondary | ICD-10-CM | POA: Diagnosis not present

## 2021-10-14 DIAGNOSIS — E871 Hypo-osmolality and hyponatremia: Secondary | ICD-10-CM | POA: Diagnosis not present

## 2021-10-14 DIAGNOSIS — J9622 Acute and chronic respiratory failure with hypercapnia: Secondary | ICD-10-CM | POA: Diagnosis not present

## 2021-10-14 DIAGNOSIS — J9621 Acute and chronic respiratory failure with hypoxia: Secondary | ICD-10-CM | POA: Diagnosis not present

## 2021-10-14 DIAGNOSIS — I951 Orthostatic hypotension: Secondary | ICD-10-CM | POA: Diagnosis not present

## 2021-10-14 DIAGNOSIS — I255 Ischemic cardiomyopathy: Secondary | ICD-10-CM | POA: Diagnosis not present

## 2021-10-14 DIAGNOSIS — J310 Chronic rhinitis: Secondary | ICD-10-CM | POA: Diagnosis not present

## 2021-10-14 DIAGNOSIS — J9601 Acute respiratory failure with hypoxia: Secondary | ICD-10-CM | POA: Diagnosis not present

## 2021-10-14 DIAGNOSIS — Z87891 Personal history of nicotine dependence: Secondary | ICD-10-CM | POA: Diagnosis not present

## 2021-10-15 DIAGNOSIS — N184 Chronic kidney disease, stage 4 (severe): Secondary | ICD-10-CM | POA: Diagnosis not present

## 2021-10-15 DIAGNOSIS — H00039 Abscess of eyelid unspecified eye, unspecified eyelid: Secondary | ICD-10-CM | POA: Diagnosis not present

## 2021-10-15 DIAGNOSIS — D638 Anemia in other chronic diseases classified elsewhere: Secondary | ICD-10-CM | POA: Diagnosis not present

## 2021-10-15 DIAGNOSIS — J439 Emphysema, unspecified: Secondary | ICD-10-CM | POA: Diagnosis not present

## 2021-10-15 DIAGNOSIS — A481 Legionnaires' disease: Secondary | ICD-10-CM | POA: Diagnosis not present

## 2021-10-16 DIAGNOSIS — J45909 Unspecified asthma, uncomplicated: Secondary | ICD-10-CM | POA: Diagnosis not present

## 2021-10-16 DIAGNOSIS — J439 Emphysema, unspecified: Secondary | ICD-10-CM | POA: Diagnosis not present

## 2021-10-16 DIAGNOSIS — I255 Ischemic cardiomyopathy: Secondary | ICD-10-CM | POA: Diagnosis not present

## 2021-10-16 DIAGNOSIS — E119 Type 2 diabetes mellitus without complications: Secondary | ICD-10-CM | POA: Diagnosis not present

## 2021-10-16 DIAGNOSIS — E871 Hypo-osmolality and hyponatremia: Secondary | ICD-10-CM | POA: Diagnosis not present

## 2021-10-16 DIAGNOSIS — I714 Abdominal aortic aneurysm, without rupture, unspecified: Secondary | ICD-10-CM | POA: Diagnosis not present

## 2021-10-16 DIAGNOSIS — J9601 Acute respiratory failure with hypoxia: Secondary | ICD-10-CM | POA: Diagnosis not present

## 2021-10-16 DIAGNOSIS — I119 Hypertensive heart disease without heart failure: Secondary | ICD-10-CM | POA: Diagnosis not present

## 2021-10-16 DIAGNOSIS — K5790 Diverticulosis of intestine, part unspecified, without perforation or abscess without bleeding: Secondary | ICD-10-CM | POA: Diagnosis not present

## 2021-10-16 DIAGNOSIS — Z7982 Long term (current) use of aspirin: Secondary | ICD-10-CM | POA: Diagnosis not present

## 2021-10-16 DIAGNOSIS — I951 Orthostatic hypotension: Secondary | ICD-10-CM | POA: Diagnosis not present

## 2021-10-16 DIAGNOSIS — Z9981 Dependence on supplemental oxygen: Secondary | ICD-10-CM | POA: Diagnosis not present

## 2021-10-16 DIAGNOSIS — Z7951 Long term (current) use of inhaled steroids: Secondary | ICD-10-CM | POA: Diagnosis not present

## 2021-10-16 DIAGNOSIS — N179 Acute kidney failure, unspecified: Secondary | ICD-10-CM | POA: Diagnosis not present

## 2021-10-16 DIAGNOSIS — J310 Chronic rhinitis: Secondary | ICD-10-CM | POA: Diagnosis not present

## 2021-10-16 DIAGNOSIS — D649 Anemia, unspecified: Secondary | ICD-10-CM | POA: Diagnosis not present

## 2021-10-16 DIAGNOSIS — J9622 Acute and chronic respiratory failure with hypercapnia: Secondary | ICD-10-CM | POA: Diagnosis not present

## 2021-10-16 DIAGNOSIS — D696 Thrombocytopenia, unspecified: Secondary | ICD-10-CM | POA: Diagnosis not present

## 2021-10-16 DIAGNOSIS — J9621 Acute and chronic respiratory failure with hypoxia: Secondary | ICD-10-CM | POA: Diagnosis not present

## 2021-10-16 DIAGNOSIS — E785 Hyperlipidemia, unspecified: Secondary | ICD-10-CM | POA: Diagnosis not present

## 2021-10-16 DIAGNOSIS — L988 Other specified disorders of the skin and subcutaneous tissue: Secondary | ICD-10-CM | POA: Diagnosis not present

## 2021-10-16 DIAGNOSIS — Z87891 Personal history of nicotine dependence: Secondary | ICD-10-CM | POA: Diagnosis not present

## 2021-10-20 DIAGNOSIS — I255 Ischemic cardiomyopathy: Secondary | ICD-10-CM | POA: Diagnosis not present

## 2021-10-20 DIAGNOSIS — I951 Orthostatic hypotension: Secondary | ICD-10-CM | POA: Diagnosis not present

## 2021-10-20 DIAGNOSIS — Z7951 Long term (current) use of inhaled steroids: Secondary | ICD-10-CM | POA: Diagnosis not present

## 2021-10-20 DIAGNOSIS — J9622 Acute and chronic respiratory failure with hypercapnia: Secondary | ICD-10-CM | POA: Diagnosis not present

## 2021-10-20 DIAGNOSIS — L988 Other specified disorders of the skin and subcutaneous tissue: Secondary | ICD-10-CM | POA: Diagnosis not present

## 2021-10-20 DIAGNOSIS — I119 Hypertensive heart disease without heart failure: Secondary | ICD-10-CM | POA: Diagnosis not present

## 2021-10-20 DIAGNOSIS — Z7982 Long term (current) use of aspirin: Secondary | ICD-10-CM | POA: Diagnosis not present

## 2021-10-20 DIAGNOSIS — J45909 Unspecified asthma, uncomplicated: Secondary | ICD-10-CM | POA: Diagnosis not present

## 2021-10-20 DIAGNOSIS — E119 Type 2 diabetes mellitus without complications: Secondary | ICD-10-CM | POA: Diagnosis not present

## 2021-10-20 DIAGNOSIS — E871 Hypo-osmolality and hyponatremia: Secondary | ICD-10-CM | POA: Diagnosis not present

## 2021-10-20 DIAGNOSIS — I714 Abdominal aortic aneurysm, without rupture, unspecified: Secondary | ICD-10-CM | POA: Diagnosis not present

## 2021-10-20 DIAGNOSIS — J310 Chronic rhinitis: Secondary | ICD-10-CM | POA: Diagnosis not present

## 2021-10-20 DIAGNOSIS — K5790 Diverticulosis of intestine, part unspecified, without perforation or abscess without bleeding: Secondary | ICD-10-CM | POA: Diagnosis not present

## 2021-10-20 DIAGNOSIS — Z9981 Dependence on supplemental oxygen: Secondary | ICD-10-CM | POA: Diagnosis not present

## 2021-10-20 DIAGNOSIS — E785 Hyperlipidemia, unspecified: Secondary | ICD-10-CM | POA: Diagnosis not present

## 2021-10-20 DIAGNOSIS — J439 Emphysema, unspecified: Secondary | ICD-10-CM | POA: Diagnosis not present

## 2021-10-20 DIAGNOSIS — J9621 Acute and chronic respiratory failure with hypoxia: Secondary | ICD-10-CM | POA: Diagnosis not present

## 2021-10-20 DIAGNOSIS — N179 Acute kidney failure, unspecified: Secondary | ICD-10-CM | POA: Diagnosis not present

## 2021-10-20 DIAGNOSIS — D696 Thrombocytopenia, unspecified: Secondary | ICD-10-CM | POA: Diagnosis not present

## 2021-10-20 DIAGNOSIS — J9601 Acute respiratory failure with hypoxia: Secondary | ICD-10-CM | POA: Diagnosis not present

## 2021-10-20 DIAGNOSIS — D649 Anemia, unspecified: Secondary | ICD-10-CM | POA: Diagnosis not present

## 2021-10-20 DIAGNOSIS — Z87891 Personal history of nicotine dependence: Secondary | ICD-10-CM | POA: Diagnosis not present

## 2021-10-21 DIAGNOSIS — J9601 Acute respiratory failure with hypoxia: Secondary | ICD-10-CM | POA: Diagnosis not present

## 2021-10-21 DIAGNOSIS — Z87891 Personal history of nicotine dependence: Secondary | ICD-10-CM | POA: Diagnosis not present

## 2021-10-21 DIAGNOSIS — J45909 Unspecified asthma, uncomplicated: Secondary | ICD-10-CM | POA: Diagnosis not present

## 2021-10-21 DIAGNOSIS — D696 Thrombocytopenia, unspecified: Secondary | ICD-10-CM | POA: Diagnosis not present

## 2021-10-21 DIAGNOSIS — D649 Anemia, unspecified: Secondary | ICD-10-CM | POA: Diagnosis not present

## 2021-10-21 DIAGNOSIS — J9621 Acute and chronic respiratory failure with hypoxia: Secondary | ICD-10-CM | POA: Diagnosis not present

## 2021-10-21 DIAGNOSIS — E785 Hyperlipidemia, unspecified: Secondary | ICD-10-CM | POA: Diagnosis not present

## 2021-10-21 DIAGNOSIS — L988 Other specified disorders of the skin and subcutaneous tissue: Secondary | ICD-10-CM | POA: Diagnosis not present

## 2021-10-21 DIAGNOSIS — J439 Emphysema, unspecified: Secondary | ICD-10-CM | POA: Diagnosis not present

## 2021-10-21 DIAGNOSIS — I951 Orthostatic hypotension: Secondary | ICD-10-CM | POA: Diagnosis not present

## 2021-10-21 DIAGNOSIS — J310 Chronic rhinitis: Secondary | ICD-10-CM | POA: Diagnosis not present

## 2021-10-21 DIAGNOSIS — I119 Hypertensive heart disease without heart failure: Secondary | ICD-10-CM | POA: Diagnosis not present

## 2021-10-21 DIAGNOSIS — E871 Hypo-osmolality and hyponatremia: Secondary | ICD-10-CM | POA: Diagnosis not present

## 2021-10-21 DIAGNOSIS — N179 Acute kidney failure, unspecified: Secondary | ICD-10-CM | POA: Diagnosis not present

## 2021-10-21 DIAGNOSIS — K5790 Diverticulosis of intestine, part unspecified, without perforation or abscess without bleeding: Secondary | ICD-10-CM | POA: Diagnosis not present

## 2021-10-21 DIAGNOSIS — I714 Abdominal aortic aneurysm, without rupture, unspecified: Secondary | ICD-10-CM | POA: Diagnosis not present

## 2021-10-21 DIAGNOSIS — E119 Type 2 diabetes mellitus without complications: Secondary | ICD-10-CM | POA: Diagnosis not present

## 2021-10-21 DIAGNOSIS — Z9981 Dependence on supplemental oxygen: Secondary | ICD-10-CM | POA: Diagnosis not present

## 2021-10-21 DIAGNOSIS — Z7951 Long term (current) use of inhaled steroids: Secondary | ICD-10-CM | POA: Diagnosis not present

## 2021-10-21 DIAGNOSIS — I255 Ischemic cardiomyopathy: Secondary | ICD-10-CM | POA: Diagnosis not present

## 2021-10-21 DIAGNOSIS — Z7982 Long term (current) use of aspirin: Secondary | ICD-10-CM | POA: Diagnosis not present

## 2021-10-21 DIAGNOSIS — J9622 Acute and chronic respiratory failure with hypercapnia: Secondary | ICD-10-CM | POA: Diagnosis not present

## 2021-10-22 DIAGNOSIS — J439 Emphysema, unspecified: Secondary | ICD-10-CM | POA: Diagnosis not present

## 2021-10-22 DIAGNOSIS — E785 Hyperlipidemia, unspecified: Secondary | ICD-10-CM | POA: Diagnosis not present

## 2021-10-22 DIAGNOSIS — N179 Acute kidney failure, unspecified: Secondary | ICD-10-CM | POA: Diagnosis not present

## 2021-10-22 DIAGNOSIS — Z87891 Personal history of nicotine dependence: Secondary | ICD-10-CM | POA: Diagnosis not present

## 2021-10-22 DIAGNOSIS — J45909 Unspecified asthma, uncomplicated: Secondary | ICD-10-CM | POA: Diagnosis not present

## 2021-10-22 DIAGNOSIS — J9622 Acute and chronic respiratory failure with hypercapnia: Secondary | ICD-10-CM | POA: Diagnosis not present

## 2021-10-22 DIAGNOSIS — L988 Other specified disorders of the skin and subcutaneous tissue: Secondary | ICD-10-CM | POA: Diagnosis not present

## 2021-10-22 DIAGNOSIS — I951 Orthostatic hypotension: Secondary | ICD-10-CM | POA: Diagnosis not present

## 2021-10-22 DIAGNOSIS — E871 Hypo-osmolality and hyponatremia: Secondary | ICD-10-CM | POA: Diagnosis not present

## 2021-10-22 DIAGNOSIS — I714 Abdominal aortic aneurysm, without rupture, unspecified: Secondary | ICD-10-CM | POA: Diagnosis not present

## 2021-10-22 DIAGNOSIS — Z9981 Dependence on supplemental oxygen: Secondary | ICD-10-CM | POA: Diagnosis not present

## 2021-10-22 DIAGNOSIS — I255 Ischemic cardiomyopathy: Secondary | ICD-10-CM | POA: Diagnosis not present

## 2021-10-22 DIAGNOSIS — I119 Hypertensive heart disease without heart failure: Secondary | ICD-10-CM | POA: Diagnosis not present

## 2021-10-22 DIAGNOSIS — Z7951 Long term (current) use of inhaled steroids: Secondary | ICD-10-CM | POA: Diagnosis not present

## 2021-10-22 DIAGNOSIS — D696 Thrombocytopenia, unspecified: Secondary | ICD-10-CM | POA: Diagnosis not present

## 2021-10-22 DIAGNOSIS — J9621 Acute and chronic respiratory failure with hypoxia: Secondary | ICD-10-CM | POA: Diagnosis not present

## 2021-10-22 DIAGNOSIS — J310 Chronic rhinitis: Secondary | ICD-10-CM | POA: Diagnosis not present

## 2021-10-22 DIAGNOSIS — E119 Type 2 diabetes mellitus without complications: Secondary | ICD-10-CM | POA: Diagnosis not present

## 2021-10-22 DIAGNOSIS — D649 Anemia, unspecified: Secondary | ICD-10-CM | POA: Diagnosis not present

## 2021-10-22 DIAGNOSIS — J9601 Acute respiratory failure with hypoxia: Secondary | ICD-10-CM | POA: Diagnosis not present

## 2021-10-22 DIAGNOSIS — Z7982 Long term (current) use of aspirin: Secondary | ICD-10-CM | POA: Diagnosis not present

## 2021-10-22 DIAGNOSIS — K5790 Diverticulosis of intestine, part unspecified, without perforation or abscess without bleeding: Secondary | ICD-10-CM | POA: Diagnosis not present

## 2021-10-23 DIAGNOSIS — J439 Emphysema, unspecified: Secondary | ICD-10-CM | POA: Diagnosis not present

## 2021-10-23 DIAGNOSIS — Z7982 Long term (current) use of aspirin: Secondary | ICD-10-CM | POA: Diagnosis not present

## 2021-10-23 DIAGNOSIS — L988 Other specified disorders of the skin and subcutaneous tissue: Secondary | ICD-10-CM | POA: Diagnosis not present

## 2021-10-23 DIAGNOSIS — I255 Ischemic cardiomyopathy: Secondary | ICD-10-CM | POA: Diagnosis not present

## 2021-10-23 DIAGNOSIS — Z9981 Dependence on supplemental oxygen: Secondary | ICD-10-CM | POA: Diagnosis not present

## 2021-10-23 DIAGNOSIS — D649 Anemia, unspecified: Secondary | ICD-10-CM | POA: Diagnosis not present

## 2021-10-23 DIAGNOSIS — E785 Hyperlipidemia, unspecified: Secondary | ICD-10-CM | POA: Diagnosis not present

## 2021-10-23 DIAGNOSIS — E119 Type 2 diabetes mellitus without complications: Secondary | ICD-10-CM | POA: Diagnosis not present

## 2021-10-23 DIAGNOSIS — J9622 Acute and chronic respiratory failure with hypercapnia: Secondary | ICD-10-CM | POA: Diagnosis not present

## 2021-10-23 DIAGNOSIS — J9621 Acute and chronic respiratory failure with hypoxia: Secondary | ICD-10-CM | POA: Diagnosis not present

## 2021-10-23 DIAGNOSIS — I951 Orthostatic hypotension: Secondary | ICD-10-CM | POA: Diagnosis not present

## 2021-10-23 DIAGNOSIS — D696 Thrombocytopenia, unspecified: Secondary | ICD-10-CM | POA: Diagnosis not present

## 2021-10-23 DIAGNOSIS — Z87891 Personal history of nicotine dependence: Secondary | ICD-10-CM | POA: Diagnosis not present

## 2021-10-23 DIAGNOSIS — Z7951 Long term (current) use of inhaled steroids: Secondary | ICD-10-CM | POA: Diagnosis not present

## 2021-10-23 DIAGNOSIS — E871 Hypo-osmolality and hyponatremia: Secondary | ICD-10-CM | POA: Diagnosis not present

## 2021-10-23 DIAGNOSIS — I714 Abdominal aortic aneurysm, without rupture, unspecified: Secondary | ICD-10-CM | POA: Diagnosis not present

## 2021-10-23 DIAGNOSIS — J45909 Unspecified asthma, uncomplicated: Secondary | ICD-10-CM | POA: Diagnosis not present

## 2021-10-23 DIAGNOSIS — J310 Chronic rhinitis: Secondary | ICD-10-CM | POA: Diagnosis not present

## 2021-10-23 DIAGNOSIS — J9601 Acute respiratory failure with hypoxia: Secondary | ICD-10-CM | POA: Diagnosis not present

## 2021-10-23 DIAGNOSIS — N179 Acute kidney failure, unspecified: Secondary | ICD-10-CM | POA: Diagnosis not present

## 2021-10-23 DIAGNOSIS — I119 Hypertensive heart disease without heart failure: Secondary | ICD-10-CM | POA: Diagnosis not present

## 2021-10-23 DIAGNOSIS — K5790 Diverticulosis of intestine, part unspecified, without perforation or abscess without bleeding: Secondary | ICD-10-CM | POA: Diagnosis not present

## 2021-10-24 DIAGNOSIS — I714 Abdominal aortic aneurysm, without rupture, unspecified: Secondary | ICD-10-CM | POA: Diagnosis not present

## 2021-10-24 DIAGNOSIS — Z87891 Personal history of nicotine dependence: Secondary | ICD-10-CM | POA: Diagnosis not present

## 2021-10-24 DIAGNOSIS — N179 Acute kidney failure, unspecified: Secondary | ICD-10-CM | POA: Diagnosis not present

## 2021-10-24 DIAGNOSIS — D696 Thrombocytopenia, unspecified: Secondary | ICD-10-CM | POA: Diagnosis not present

## 2021-10-24 DIAGNOSIS — J45909 Unspecified asthma, uncomplicated: Secondary | ICD-10-CM | POA: Diagnosis not present

## 2021-10-24 DIAGNOSIS — J310 Chronic rhinitis: Secondary | ICD-10-CM | POA: Diagnosis not present

## 2021-10-24 DIAGNOSIS — Z7951 Long term (current) use of inhaled steroids: Secondary | ICD-10-CM | POA: Diagnosis not present

## 2021-10-24 DIAGNOSIS — L988 Other specified disorders of the skin and subcutaneous tissue: Secondary | ICD-10-CM | POA: Diagnosis not present

## 2021-10-24 DIAGNOSIS — E785 Hyperlipidemia, unspecified: Secondary | ICD-10-CM | POA: Diagnosis not present

## 2021-10-24 DIAGNOSIS — Z7982 Long term (current) use of aspirin: Secondary | ICD-10-CM | POA: Diagnosis not present

## 2021-10-24 DIAGNOSIS — E871 Hypo-osmolality and hyponatremia: Secondary | ICD-10-CM | POA: Diagnosis not present

## 2021-10-24 DIAGNOSIS — I119 Hypertensive heart disease without heart failure: Secondary | ICD-10-CM | POA: Diagnosis not present

## 2021-10-24 DIAGNOSIS — I255 Ischemic cardiomyopathy: Secondary | ICD-10-CM | POA: Diagnosis not present

## 2021-10-24 DIAGNOSIS — K5790 Diverticulosis of intestine, part unspecified, without perforation or abscess without bleeding: Secondary | ICD-10-CM | POA: Diagnosis not present

## 2021-10-24 DIAGNOSIS — I951 Orthostatic hypotension: Secondary | ICD-10-CM | POA: Diagnosis not present

## 2021-10-24 DIAGNOSIS — Z9981 Dependence on supplemental oxygen: Secondary | ICD-10-CM | POA: Diagnosis not present

## 2021-10-24 DIAGNOSIS — E119 Type 2 diabetes mellitus without complications: Secondary | ICD-10-CM | POA: Diagnosis not present

## 2021-10-24 DIAGNOSIS — J9622 Acute and chronic respiratory failure with hypercapnia: Secondary | ICD-10-CM | POA: Diagnosis not present

## 2021-10-24 DIAGNOSIS — J9601 Acute respiratory failure with hypoxia: Secondary | ICD-10-CM | POA: Diagnosis not present

## 2021-10-24 DIAGNOSIS — J9621 Acute and chronic respiratory failure with hypoxia: Secondary | ICD-10-CM | POA: Diagnosis not present

## 2021-10-24 DIAGNOSIS — J439 Emphysema, unspecified: Secondary | ICD-10-CM | POA: Diagnosis not present

## 2021-10-24 DIAGNOSIS — D649 Anemia, unspecified: Secondary | ICD-10-CM | POA: Diagnosis not present

## 2021-10-27 DIAGNOSIS — Z9981 Dependence on supplemental oxygen: Secondary | ICD-10-CM | POA: Diagnosis not present

## 2021-10-27 DIAGNOSIS — D649 Anemia, unspecified: Secondary | ICD-10-CM | POA: Diagnosis not present

## 2021-10-27 DIAGNOSIS — Z87891 Personal history of nicotine dependence: Secondary | ICD-10-CM | POA: Diagnosis not present

## 2021-10-27 DIAGNOSIS — I951 Orthostatic hypotension: Secondary | ICD-10-CM | POA: Diagnosis not present

## 2021-10-27 DIAGNOSIS — J310 Chronic rhinitis: Secondary | ICD-10-CM | POA: Diagnosis not present

## 2021-10-27 DIAGNOSIS — J439 Emphysema, unspecified: Secondary | ICD-10-CM | POA: Diagnosis not present

## 2021-10-27 DIAGNOSIS — Z7951 Long term (current) use of inhaled steroids: Secondary | ICD-10-CM | POA: Diagnosis not present

## 2021-10-27 DIAGNOSIS — K5790 Diverticulosis of intestine, part unspecified, without perforation or abscess without bleeding: Secondary | ICD-10-CM | POA: Diagnosis not present

## 2021-10-27 DIAGNOSIS — E871 Hypo-osmolality and hyponatremia: Secondary | ICD-10-CM | POA: Diagnosis not present

## 2021-10-27 DIAGNOSIS — J45909 Unspecified asthma, uncomplicated: Secondary | ICD-10-CM | POA: Diagnosis not present

## 2021-10-27 DIAGNOSIS — N179 Acute kidney failure, unspecified: Secondary | ICD-10-CM | POA: Diagnosis not present

## 2021-10-27 DIAGNOSIS — J9621 Acute and chronic respiratory failure with hypoxia: Secondary | ICD-10-CM | POA: Diagnosis not present

## 2021-10-27 DIAGNOSIS — I255 Ischemic cardiomyopathy: Secondary | ICD-10-CM | POA: Diagnosis not present

## 2021-10-27 DIAGNOSIS — E785 Hyperlipidemia, unspecified: Secondary | ICD-10-CM | POA: Diagnosis not present

## 2021-10-27 DIAGNOSIS — J9622 Acute and chronic respiratory failure with hypercapnia: Secondary | ICD-10-CM | POA: Diagnosis not present

## 2021-10-27 DIAGNOSIS — I119 Hypertensive heart disease without heart failure: Secondary | ICD-10-CM | POA: Diagnosis not present

## 2021-10-27 DIAGNOSIS — J9601 Acute respiratory failure with hypoxia: Secondary | ICD-10-CM | POA: Diagnosis not present

## 2021-10-27 DIAGNOSIS — L988 Other specified disorders of the skin and subcutaneous tissue: Secondary | ICD-10-CM | POA: Diagnosis not present

## 2021-10-27 DIAGNOSIS — D696 Thrombocytopenia, unspecified: Secondary | ICD-10-CM | POA: Diagnosis not present

## 2021-10-27 DIAGNOSIS — I714 Abdominal aortic aneurysm, without rupture, unspecified: Secondary | ICD-10-CM | POA: Diagnosis not present

## 2021-10-27 DIAGNOSIS — E119 Type 2 diabetes mellitus without complications: Secondary | ICD-10-CM | POA: Diagnosis not present

## 2021-10-27 DIAGNOSIS — Z7982 Long term (current) use of aspirin: Secondary | ICD-10-CM | POA: Diagnosis not present

## 2021-10-28 DIAGNOSIS — E1159 Type 2 diabetes mellitus with other circulatory complications: Secondary | ICD-10-CM | POA: Diagnosis not present

## 2021-10-28 DIAGNOSIS — J449 Chronic obstructive pulmonary disease, unspecified: Secondary | ICD-10-CM | POA: Diagnosis not present

## 2021-10-29 DIAGNOSIS — J9601 Acute respiratory failure with hypoxia: Secondary | ICD-10-CM | POA: Diagnosis not present

## 2021-10-29 DIAGNOSIS — E871 Hypo-osmolality and hyponatremia: Secondary | ICD-10-CM | POA: Diagnosis not present

## 2021-10-29 DIAGNOSIS — Z7982 Long term (current) use of aspirin: Secondary | ICD-10-CM | POA: Diagnosis not present

## 2021-10-29 DIAGNOSIS — K5790 Diverticulosis of intestine, part unspecified, without perforation or abscess without bleeding: Secondary | ICD-10-CM | POA: Diagnosis not present

## 2021-10-29 DIAGNOSIS — I714 Abdominal aortic aneurysm, without rupture, unspecified: Secondary | ICD-10-CM | POA: Diagnosis not present

## 2021-10-29 DIAGNOSIS — J310 Chronic rhinitis: Secondary | ICD-10-CM | POA: Diagnosis not present

## 2021-10-29 DIAGNOSIS — Z87891 Personal history of nicotine dependence: Secondary | ICD-10-CM | POA: Diagnosis not present

## 2021-10-29 DIAGNOSIS — L988 Other specified disorders of the skin and subcutaneous tissue: Secondary | ICD-10-CM | POA: Diagnosis not present

## 2021-10-29 DIAGNOSIS — I951 Orthostatic hypotension: Secondary | ICD-10-CM | POA: Diagnosis not present

## 2021-10-29 DIAGNOSIS — D696 Thrombocytopenia, unspecified: Secondary | ICD-10-CM | POA: Diagnosis not present

## 2021-10-29 DIAGNOSIS — D649 Anemia, unspecified: Secondary | ICD-10-CM | POA: Diagnosis not present

## 2021-10-29 DIAGNOSIS — E119 Type 2 diabetes mellitus without complications: Secondary | ICD-10-CM | POA: Diagnosis not present

## 2021-10-29 DIAGNOSIS — Z7951 Long term (current) use of inhaled steroids: Secondary | ICD-10-CM | POA: Diagnosis not present

## 2021-10-29 DIAGNOSIS — I119 Hypertensive heart disease without heart failure: Secondary | ICD-10-CM | POA: Diagnosis not present

## 2021-10-29 DIAGNOSIS — I255 Ischemic cardiomyopathy: Secondary | ICD-10-CM | POA: Diagnosis not present

## 2021-10-29 DIAGNOSIS — N179 Acute kidney failure, unspecified: Secondary | ICD-10-CM | POA: Diagnosis not present

## 2021-10-29 DIAGNOSIS — Z9981 Dependence on supplemental oxygen: Secondary | ICD-10-CM | POA: Diagnosis not present

## 2021-10-29 DIAGNOSIS — E785 Hyperlipidemia, unspecified: Secondary | ICD-10-CM | POA: Diagnosis not present

## 2021-10-29 DIAGNOSIS — J439 Emphysema, unspecified: Secondary | ICD-10-CM | POA: Diagnosis not present

## 2021-10-30 ENCOUNTER — Other Ambulatory Visit: Payer: Self-pay | Admitting: *Deleted

## 2021-10-30 DIAGNOSIS — I255 Ischemic cardiomyopathy: Secondary | ICD-10-CM | POA: Diagnosis not present

## 2021-10-30 DIAGNOSIS — J9601 Acute respiratory failure with hypoxia: Secondary | ICD-10-CM | POA: Diagnosis not present

## 2021-10-30 DIAGNOSIS — Z9981 Dependence on supplemental oxygen: Secondary | ICD-10-CM | POA: Diagnosis not present

## 2021-10-30 DIAGNOSIS — K5790 Diverticulosis of intestine, part unspecified, without perforation or abscess without bleeding: Secondary | ICD-10-CM | POA: Diagnosis not present

## 2021-10-30 DIAGNOSIS — I951 Orthostatic hypotension: Secondary | ICD-10-CM | POA: Diagnosis not present

## 2021-10-30 DIAGNOSIS — L988 Other specified disorders of the skin and subcutaneous tissue: Secondary | ICD-10-CM | POA: Diagnosis not present

## 2021-10-30 DIAGNOSIS — D696 Thrombocytopenia, unspecified: Secondary | ICD-10-CM | POA: Diagnosis not present

## 2021-10-30 DIAGNOSIS — E871 Hypo-osmolality and hyponatremia: Secondary | ICD-10-CM | POA: Diagnosis not present

## 2021-10-30 DIAGNOSIS — J439 Emphysema, unspecified: Secondary | ICD-10-CM | POA: Diagnosis not present

## 2021-10-30 DIAGNOSIS — Z87891 Personal history of nicotine dependence: Secondary | ICD-10-CM | POA: Diagnosis not present

## 2021-10-30 DIAGNOSIS — I714 Abdominal aortic aneurysm, without rupture, unspecified: Secondary | ICD-10-CM | POA: Diagnosis not present

## 2021-10-30 DIAGNOSIS — E119 Type 2 diabetes mellitus without complications: Secondary | ICD-10-CM | POA: Diagnosis not present

## 2021-10-30 DIAGNOSIS — D649 Anemia, unspecified: Secondary | ICD-10-CM | POA: Diagnosis not present

## 2021-10-30 DIAGNOSIS — J310 Chronic rhinitis: Secondary | ICD-10-CM | POA: Diagnosis not present

## 2021-10-30 DIAGNOSIS — N179 Acute kidney failure, unspecified: Secondary | ICD-10-CM | POA: Diagnosis not present

## 2021-10-30 DIAGNOSIS — Z7951 Long term (current) use of inhaled steroids: Secondary | ICD-10-CM | POA: Diagnosis not present

## 2021-10-30 DIAGNOSIS — Z7982 Long term (current) use of aspirin: Secondary | ICD-10-CM | POA: Diagnosis not present

## 2021-10-30 DIAGNOSIS — E785 Hyperlipidemia, unspecified: Secondary | ICD-10-CM | POA: Diagnosis not present

## 2021-10-30 DIAGNOSIS — I119 Hypertensive heart disease without heart failure: Secondary | ICD-10-CM | POA: Diagnosis not present

## 2021-10-30 NOTE — Patient Outreach (Signed)
[facility]) Care Management Telephonic RN Care Manager Note   10/30/2021 Name:  Harry Norris MRN:  [id number] DOB:  09-30-1935  Summary: Verified pt remains in the GREEN zone with no acute needs. Will continue to self management of care related to pt's managing his COPD.  Recommendations/Changes made from today's visit: Will continue to encourage adherence with managing his COPD and ongoing safety measures due to frailty.  Subjective: Harry Norris is an 86 y.o. year old male who is a primary patient of Harry Fusi, MD. The care management team was consulted for assistance with care management and/or care coordination needs.    Telephonic RN Care Manager completed Telephone Visit today.  Objective:   Medications Reviewed Today     Reviewed by Alejandro Mulling, RN (Registered Nurse) on 10/02/21 at 1521  Med List Status: <None>   Medication Order Taking? Sig Documenting Provider Last Dose Status Informant  albuterol (VENTOLIN HFA) 108 (90 Base) MCG/ACT inhaler [id number] Yes Inhale 2 puffs into the lungs every 6 (six) hours as needed for wheezing or shortness of breath. [provider] Taking Active Child  amoxicillin-clavulanate (AUGMENTIN) 500-125 MG tablet [id number] Yes Take 1 tablet (500 mg total) by mouth 2 (two) times daily for 4 days. Levin Erp, MD Taking Active   atorvastatin (LIPITOR) 40 MG tablet [id number] Yes Take 40 mg by mouth at bedtime. [provider] Taking Active Child  fenofibrate 160 MG tablet [id number] Yes Take 160 mg by mouth daily. [provider] Taking Active Child  FLOVENT HFA 110 MCG/ACT inhaler [id number] Yes Inhale 2 puffs into the lungs 2 (two) times daily. [provider] Taking Active Child  formoterol (PERFOROMIST) 20 MCG/2ML nebulizer solution [id number] Yes Take 20 mcg by nebulization 2 (two) times daily. [provider] Taking Active Child  Melatonin 10 MG TABS [id number] Yes  Take 1 tablet by mouth at bedtime. [provider] Taking Active Child  Multiple Vitamins-Minerals (CENTRUM SILVER 50+MEN) TABS [id number] Yes Take 1 tablet by mouth daily with breakfast. [provider] Taking Active Child  naphazoline-glycerin (CLEAR EYES REDNESS) 0.012-0.2 % SOLN [id number] Yes Place 1-2 drops into both eyes 4 (four) times daily as needed for eye irritation. Levin Erp, MD Taking Active   Omega-3 Fatty Acids (FISH OIL) 1000 MG CAPS [id number] Yes Take 1,000 mg by mouth 2 (two) times daily. [provider] Taking Active Child  OXYGEN [id number] Yes Inhale 2 L/min into the lungs as needed (for shortness of breath). [provider] Taking Active Child  pantoprazole (PROTONIX) 40 MG tablet [id number] Yes Take 1 tablet (40 mg total) by mouth 2 (two) times daily. Levin Erp, MD Taking Active   vitamin C (ASCORBIC ACID) 500 MG tablet [id number] Yes Take 500 mg by mouth daily. [provider] Taking Active Child  vitamin E 400 UNIT capsule [id number] Yes Take 400 Units by mouth daily. [provider] Taking Active Child             SDOH:  (Social Determinants of Health) assessments and interventions performed:     Care Plan  Review of patient past medical history, allergies, medications, health status, including review of consultants reports, laboratory and other test data, was performed as part of comprehensive evaluation for care management services.   Care Plan : [name]  Updates made by Alejandro Mulling, RN since 10/30/2021 12:00 AM     Problem: Knowledge Deficit related to COPD and care coordination  needs   Priority: High     Long-Range Goal: Development of care for management of COPD   Start Date: 08/07/2021  Expected End Date: 04/30/2022  This Visit's Progress: On track  Recent Progress: On track  Priority: High  Note:   Current Barriers:  Knowledge Deficits related to plan of care for  management of COPD   RNCM Clinical Goal(s):  Patient will verbalize understanding of plan for management of COPD as evidenced by awareness of what to do if exacerbated symptoms are encountered. continue to work with RN Care Manager to address care management and care coordination needs related to  COPD as evidenced by adherence to CM Team Scheduled appointments through collaboration with RN Care manager, provider, and care team.   Interventions: Inter-disciplinary care team collaboration (see longitudinal plan of care) Evaluation of current treatment plan related to  self management and patient's adherence to plan as established by provider   COPD Interventions:  (Status:  Goal on track:  Yes.) Long Term Goal Provided patient with basic written and verbal COPD education on self care/management/and exacerbation prevention Advised patient to track and manage COPD triggers Provided instruction about proper use of medications used for management of COPD including inhalers Advised patient to self assesses COPD action plan zone and make appointment with provider if in the yellow zone for 48 hours without improvement Advised patient to engage in light exercise as tolerated 3-5 days a week to aid in the the management of COPD Provided education about and advised patient to utilize infection prevention strategies to reduce risk of respiratory infection Discussed the importance of adequate rest and management of fatigue with COPD .  10/02/2021 Updates: pt with recent hospitalization and d/c on 1/30. Pt has followed up with his provider Dr. Tomasa Norris on 1/31 pending other appointments that have been reviewed. Spoke with both the pt and his primary caregiver son Harry Norris) who verified all medication changes and adherence to all medications prescribed. Pt recovering well with no acute needs at this time. Caregiver states previous HHealth services are planned to resume with PT/OT and caregivers plan to place pt  into ALF [facility] in [place]). States that have already visited the facility for placement however pending paperwork and pending other possible benefits pt may have access to receiving with the [place]. RN discussed life alert if the process will take awhile due to pt's ongoing weakness and son states pt needs someone with his at all times for adherence with his medications. Denies any issues with his COPD and pt continue to all inhalers and only a few days left on the antibiotics provider for the ordered 4 days. Pt remains in the GREEN zone with no acute breathing problems and pt only using his home O2 via 2 liters PRN. No other needs presented as pt continue to request monthly follow up calls instead of weekly transition of care. Will follow up in a few weeks concerning pt's progress with the ALF placement process.   10/30/2021 Updates: Pt reports he is doing well in managing her ongoing COPD. Verified pt remains in the GREEN zone using his inhalers as prescribed with no delays. Verified medication adherence and all upcoming appointments with sufficient transportation. Pt continue to have support from his so and has no added needs at this time. Denies any falls or related injuries as pt completed recent PT/OT services. Remains pending for ALF placement in [place].  All inquires and questions addressed with no acute need since the last conversation.  Will follow up next month with ongoing case management services.   Patient Goals/Self-Care Activities: Take all medications as prescribed Attend all scheduled provider appointments Call pharmacy for medication refills 3-7 days in advance of running out of medications Perform all self care activities independently  Perform IADL's (shopping, preparing meals, housekeeping, managing finances) independently Call provider office for new concerns or questions   Update 09/02/2021 Pt doing well with no acute issues on his birthday today. Pt breathing  well with no related incidents or difficulty breathing. Pt continue to take all medications to prevent acute events. Reiterated on the COPD action zone as pt remains in the GREEN zone with no acute issues. Pt remains aware of who to call with any acute symptoms and has a good support system available.  Follow Up Plan:  Telephone follow up appointment with care management team member scheduled for:  April 2023 The patient has been provided with contact information for the care management team and has been advised to call with any health related questions or concerns.       Elliot Cousin, RN Care Management Coordinator [facility] Main Office [phone]

## 2021-11-03 DIAGNOSIS — Z87891 Personal history of nicotine dependence: Secondary | ICD-10-CM | POA: Diagnosis not present

## 2021-11-03 DIAGNOSIS — J439 Emphysema, unspecified: Secondary | ICD-10-CM | POA: Diagnosis not present

## 2021-11-03 DIAGNOSIS — E785 Hyperlipidemia, unspecified: Secondary | ICD-10-CM | POA: Diagnosis not present

## 2021-11-03 DIAGNOSIS — J9601 Acute respiratory failure with hypoxia: Secondary | ICD-10-CM | POA: Diagnosis not present

## 2021-11-03 DIAGNOSIS — E871 Hypo-osmolality and hyponatremia: Secondary | ICD-10-CM | POA: Diagnosis not present

## 2021-11-03 DIAGNOSIS — E119 Type 2 diabetes mellitus without complications: Secondary | ICD-10-CM | POA: Diagnosis not present

## 2021-11-03 DIAGNOSIS — L988 Other specified disorders of the skin and subcutaneous tissue: Secondary | ICD-10-CM | POA: Diagnosis not present

## 2021-11-03 DIAGNOSIS — N179 Acute kidney failure, unspecified: Secondary | ICD-10-CM | POA: Diagnosis not present

## 2021-11-03 DIAGNOSIS — D649 Anemia, unspecified: Secondary | ICD-10-CM | POA: Diagnosis not present

## 2021-11-03 DIAGNOSIS — Z7982 Long term (current) use of aspirin: Secondary | ICD-10-CM | POA: Diagnosis not present

## 2021-11-03 DIAGNOSIS — I255 Ischemic cardiomyopathy: Secondary | ICD-10-CM | POA: Diagnosis not present

## 2021-11-03 DIAGNOSIS — D696 Thrombocytopenia, unspecified: Secondary | ICD-10-CM | POA: Diagnosis not present

## 2021-11-03 DIAGNOSIS — I119 Hypertensive heart disease without heart failure: Secondary | ICD-10-CM | POA: Diagnosis not present

## 2021-11-03 DIAGNOSIS — J449 Chronic obstructive pulmonary disease, unspecified: Secondary | ICD-10-CM | POA: Diagnosis not present

## 2021-11-03 DIAGNOSIS — J310 Chronic rhinitis: Secondary | ICD-10-CM | POA: Diagnosis not present

## 2021-11-03 DIAGNOSIS — Z7951 Long term (current) use of inhaled steroids: Secondary | ICD-10-CM | POA: Diagnosis not present

## 2021-11-03 DIAGNOSIS — K5790 Diverticulosis of intestine, part unspecified, without perforation or abscess without bleeding: Secondary | ICD-10-CM | POA: Diagnosis not present

## 2021-11-03 DIAGNOSIS — Z9981 Dependence on supplemental oxygen: Secondary | ICD-10-CM | POA: Diagnosis not present

## 2021-11-03 DIAGNOSIS — I714 Abdominal aortic aneurysm, without rupture, unspecified: Secondary | ICD-10-CM | POA: Diagnosis not present

## 2021-11-03 DIAGNOSIS — I951 Orthostatic hypotension: Secondary | ICD-10-CM | POA: Diagnosis not present

## 2021-11-04 DIAGNOSIS — Z9981 Dependence on supplemental oxygen: Secondary | ICD-10-CM | POA: Diagnosis not present

## 2021-11-04 DIAGNOSIS — J9601 Acute respiratory failure with hypoxia: Secondary | ICD-10-CM | POA: Diagnosis not present

## 2021-11-04 DIAGNOSIS — Z87891 Personal history of nicotine dependence: Secondary | ICD-10-CM | POA: Diagnosis not present

## 2021-11-04 DIAGNOSIS — N179 Acute kidney failure, unspecified: Secondary | ICD-10-CM | POA: Diagnosis not present

## 2021-11-04 DIAGNOSIS — I255 Ischemic cardiomyopathy: Secondary | ICD-10-CM | POA: Diagnosis not present

## 2021-11-04 DIAGNOSIS — E785 Hyperlipidemia, unspecified: Secondary | ICD-10-CM | POA: Diagnosis not present

## 2021-11-04 DIAGNOSIS — J310 Chronic rhinitis: Secondary | ICD-10-CM | POA: Diagnosis not present

## 2021-11-04 DIAGNOSIS — J439 Emphysema, unspecified: Secondary | ICD-10-CM | POA: Diagnosis not present

## 2021-11-04 DIAGNOSIS — D696 Thrombocytopenia, unspecified: Secondary | ICD-10-CM | POA: Diagnosis not present

## 2021-11-04 DIAGNOSIS — E871 Hypo-osmolality and hyponatremia: Secondary | ICD-10-CM | POA: Diagnosis not present

## 2021-11-04 DIAGNOSIS — I714 Abdominal aortic aneurysm, without rupture, unspecified: Secondary | ICD-10-CM | POA: Diagnosis not present

## 2021-11-04 DIAGNOSIS — E119 Type 2 diabetes mellitus without complications: Secondary | ICD-10-CM | POA: Diagnosis not present

## 2021-11-04 DIAGNOSIS — L988 Other specified disorders of the skin and subcutaneous tissue: Secondary | ICD-10-CM | POA: Diagnosis not present

## 2021-11-04 DIAGNOSIS — D649 Anemia, unspecified: Secondary | ICD-10-CM | POA: Diagnosis not present

## 2021-11-04 DIAGNOSIS — Z7982 Long term (current) use of aspirin: Secondary | ICD-10-CM | POA: Diagnosis not present

## 2021-11-04 DIAGNOSIS — I119 Hypertensive heart disease without heart failure: Secondary | ICD-10-CM | POA: Diagnosis not present

## 2021-11-04 DIAGNOSIS — I951 Orthostatic hypotension: Secondary | ICD-10-CM | POA: Diagnosis not present

## 2021-11-04 DIAGNOSIS — K5790 Diverticulosis of intestine, part unspecified, without perforation or abscess without bleeding: Secondary | ICD-10-CM | POA: Diagnosis not present

## 2021-11-04 DIAGNOSIS — Z7951 Long term (current) use of inhaled steroids: Secondary | ICD-10-CM | POA: Diagnosis not present

## 2021-11-05 DIAGNOSIS — J439 Emphysema, unspecified: Secondary | ICD-10-CM | POA: Diagnosis not present

## 2021-11-10 DIAGNOSIS — K5790 Diverticulosis of intestine, part unspecified, without perforation or abscess without bleeding: Secondary | ICD-10-CM | POA: Diagnosis not present

## 2021-11-10 DIAGNOSIS — E871 Hypo-osmolality and hyponatremia: Secondary | ICD-10-CM | POA: Diagnosis not present

## 2021-11-10 DIAGNOSIS — J310 Chronic rhinitis: Secondary | ICD-10-CM | POA: Diagnosis not present

## 2021-11-10 DIAGNOSIS — J439 Emphysema, unspecified: Secondary | ICD-10-CM | POA: Diagnosis not present

## 2021-11-10 DIAGNOSIS — L988 Other specified disorders of the skin and subcutaneous tissue: Secondary | ICD-10-CM | POA: Diagnosis not present

## 2021-11-10 DIAGNOSIS — Z7982 Long term (current) use of aspirin: Secondary | ICD-10-CM | POA: Diagnosis not present

## 2021-11-10 DIAGNOSIS — I255 Ischemic cardiomyopathy: Secondary | ICD-10-CM | POA: Diagnosis not present

## 2021-11-10 DIAGNOSIS — N179 Acute kidney failure, unspecified: Secondary | ICD-10-CM | POA: Diagnosis not present

## 2021-11-10 DIAGNOSIS — J9601 Acute respiratory failure with hypoxia: Secondary | ICD-10-CM | POA: Diagnosis not present

## 2021-11-10 DIAGNOSIS — E119 Type 2 diabetes mellitus without complications: Secondary | ICD-10-CM | POA: Diagnosis not present

## 2021-11-10 DIAGNOSIS — E785 Hyperlipidemia, unspecified: Secondary | ICD-10-CM | POA: Diagnosis not present

## 2021-11-10 DIAGNOSIS — Z7951 Long term (current) use of inhaled steroids: Secondary | ICD-10-CM | POA: Diagnosis not present

## 2021-11-10 DIAGNOSIS — I119 Hypertensive heart disease without heart failure: Secondary | ICD-10-CM | POA: Diagnosis not present

## 2021-11-10 DIAGNOSIS — Z9981 Dependence on supplemental oxygen: Secondary | ICD-10-CM | POA: Diagnosis not present

## 2021-11-10 DIAGNOSIS — Z87891 Personal history of nicotine dependence: Secondary | ICD-10-CM | POA: Diagnosis not present

## 2021-11-10 DIAGNOSIS — D696 Thrombocytopenia, unspecified: Secondary | ICD-10-CM | POA: Diagnosis not present

## 2021-11-10 DIAGNOSIS — I951 Orthostatic hypotension: Secondary | ICD-10-CM | POA: Diagnosis not present

## 2021-11-10 DIAGNOSIS — D649 Anemia, unspecified: Secondary | ICD-10-CM | POA: Diagnosis not present

## 2021-11-10 DIAGNOSIS — I714 Abdominal aortic aneurysm, without rupture, unspecified: Secondary | ICD-10-CM | POA: Diagnosis not present

## 2021-11-11 DIAGNOSIS — I255 Ischemic cardiomyopathy: Secondary | ICD-10-CM | POA: Diagnosis not present

## 2021-11-11 DIAGNOSIS — Z87891 Personal history of nicotine dependence: Secondary | ICD-10-CM | POA: Diagnosis not present

## 2021-11-11 DIAGNOSIS — E785 Hyperlipidemia, unspecified: Secondary | ICD-10-CM | POA: Diagnosis not present

## 2021-11-11 DIAGNOSIS — L988 Other specified disorders of the skin and subcutaneous tissue: Secondary | ICD-10-CM | POA: Diagnosis not present

## 2021-11-11 DIAGNOSIS — J439 Emphysema, unspecified: Secondary | ICD-10-CM | POA: Diagnosis not present

## 2021-11-11 DIAGNOSIS — Z7982 Long term (current) use of aspirin: Secondary | ICD-10-CM | POA: Diagnosis not present

## 2021-11-11 DIAGNOSIS — Z7951 Long term (current) use of inhaled steroids: Secondary | ICD-10-CM | POA: Diagnosis not present

## 2021-11-11 DIAGNOSIS — E871 Hypo-osmolality and hyponatremia: Secondary | ICD-10-CM | POA: Diagnosis not present

## 2021-11-11 DIAGNOSIS — I714 Abdominal aortic aneurysm, without rupture, unspecified: Secondary | ICD-10-CM | POA: Diagnosis not present

## 2021-11-11 DIAGNOSIS — I951 Orthostatic hypotension: Secondary | ICD-10-CM | POA: Diagnosis not present

## 2021-11-11 DIAGNOSIS — J310 Chronic rhinitis: Secondary | ICD-10-CM | POA: Diagnosis not present

## 2021-11-11 DIAGNOSIS — Z9981 Dependence on supplemental oxygen: Secondary | ICD-10-CM | POA: Diagnosis not present

## 2021-11-11 DIAGNOSIS — D696 Thrombocytopenia, unspecified: Secondary | ICD-10-CM | POA: Diagnosis not present

## 2021-11-11 DIAGNOSIS — K5790 Diverticulosis of intestine, part unspecified, without perforation or abscess without bleeding: Secondary | ICD-10-CM | POA: Diagnosis not present

## 2021-11-11 DIAGNOSIS — D649 Anemia, unspecified: Secondary | ICD-10-CM | POA: Diagnosis not present

## 2021-11-11 DIAGNOSIS — J9601 Acute respiratory failure with hypoxia: Secondary | ICD-10-CM | POA: Diagnosis not present

## 2021-11-11 DIAGNOSIS — E119 Type 2 diabetes mellitus without complications: Secondary | ICD-10-CM | POA: Diagnosis not present

## 2021-11-11 DIAGNOSIS — N179 Acute kidney failure, unspecified: Secondary | ICD-10-CM | POA: Diagnosis not present

## 2021-11-11 DIAGNOSIS — I119 Hypertensive heart disease without heart failure: Secondary | ICD-10-CM | POA: Diagnosis not present

## 2021-11-18 DIAGNOSIS — D649 Anemia, unspecified: Secondary | ICD-10-CM | POA: Diagnosis not present

## 2021-11-18 DIAGNOSIS — Z87891 Personal history of nicotine dependence: Secondary | ICD-10-CM | POA: Diagnosis not present

## 2021-11-18 DIAGNOSIS — Z9981 Dependence on supplemental oxygen: Secondary | ICD-10-CM | POA: Diagnosis not present

## 2021-11-18 DIAGNOSIS — I714 Abdominal aortic aneurysm, without rupture, unspecified: Secondary | ICD-10-CM | POA: Diagnosis not present

## 2021-11-18 DIAGNOSIS — E871 Hypo-osmolality and hyponatremia: Secondary | ICD-10-CM | POA: Diagnosis not present

## 2021-11-18 DIAGNOSIS — I255 Ischemic cardiomyopathy: Secondary | ICD-10-CM | POA: Diagnosis not present

## 2021-11-18 DIAGNOSIS — N179 Acute kidney failure, unspecified: Secondary | ICD-10-CM | POA: Diagnosis not present

## 2021-11-18 DIAGNOSIS — J439 Emphysema, unspecified: Secondary | ICD-10-CM | POA: Diagnosis not present

## 2021-11-18 DIAGNOSIS — Z7951 Long term (current) use of inhaled steroids: Secondary | ICD-10-CM | POA: Diagnosis not present

## 2021-11-18 DIAGNOSIS — I119 Hypertensive heart disease without heart failure: Secondary | ICD-10-CM | POA: Diagnosis not present

## 2021-11-18 DIAGNOSIS — K5790 Diverticulosis of intestine, part unspecified, without perforation or abscess without bleeding: Secondary | ICD-10-CM | POA: Diagnosis not present

## 2021-11-18 DIAGNOSIS — J9601 Acute respiratory failure with hypoxia: Secondary | ICD-10-CM | POA: Diagnosis not present

## 2021-11-18 DIAGNOSIS — E785 Hyperlipidemia, unspecified: Secondary | ICD-10-CM | POA: Diagnosis not present

## 2021-11-18 DIAGNOSIS — D696 Thrombocytopenia, unspecified: Secondary | ICD-10-CM | POA: Diagnosis not present

## 2021-11-18 DIAGNOSIS — E119 Type 2 diabetes mellitus without complications: Secondary | ICD-10-CM | POA: Diagnosis not present

## 2021-11-18 DIAGNOSIS — L988 Other specified disorders of the skin and subcutaneous tissue: Secondary | ICD-10-CM | POA: Diagnosis not present

## 2021-11-18 DIAGNOSIS — J310 Chronic rhinitis: Secondary | ICD-10-CM | POA: Diagnosis not present

## 2021-11-18 DIAGNOSIS — I951 Orthostatic hypotension: Secondary | ICD-10-CM | POA: Diagnosis not present

## 2021-11-18 DIAGNOSIS — Z7982 Long term (current) use of aspirin: Secondary | ICD-10-CM | POA: Diagnosis not present

## 2021-11-19 DIAGNOSIS — H02109 Unspecified ectropion of unspecified eye, unspecified eyelid: Secondary | ICD-10-CM | POA: Diagnosis not present

## 2021-11-24 NOTE — Telephone Encounter (Signed)
Encounter opened in error

## 2021-11-25 ENCOUNTER — Other Ambulatory Visit: Payer: Self-pay | Admitting: Student

## 2021-11-27 DIAGNOSIS — D696 Thrombocytopenia, unspecified: Secondary | ICD-10-CM | POA: Diagnosis not present

## 2021-11-27 DIAGNOSIS — Z87891 Personal history of nicotine dependence: Secondary | ICD-10-CM | POA: Diagnosis not present

## 2021-11-27 DIAGNOSIS — I951 Orthostatic hypotension: Secondary | ICD-10-CM | POA: Diagnosis not present

## 2021-11-27 DIAGNOSIS — J9601 Acute respiratory failure with hypoxia: Secondary | ICD-10-CM | POA: Diagnosis not present

## 2021-11-27 DIAGNOSIS — Z9981 Dependence on supplemental oxygen: Secondary | ICD-10-CM | POA: Diagnosis not present

## 2021-11-27 DIAGNOSIS — J449 Chronic obstructive pulmonary disease, unspecified: Secondary | ICD-10-CM | POA: Diagnosis not present

## 2021-11-27 DIAGNOSIS — I255 Ischemic cardiomyopathy: Secondary | ICD-10-CM | POA: Diagnosis not present

## 2021-11-27 DIAGNOSIS — E871 Hypo-osmolality and hyponatremia: Secondary | ICD-10-CM | POA: Diagnosis not present

## 2021-11-27 DIAGNOSIS — J439 Emphysema, unspecified: Secondary | ICD-10-CM | POA: Diagnosis not present

## 2021-11-27 DIAGNOSIS — J310 Chronic rhinitis: Secondary | ICD-10-CM | POA: Diagnosis not present

## 2021-11-27 DIAGNOSIS — Z7951 Long term (current) use of inhaled steroids: Secondary | ICD-10-CM | POA: Diagnosis not present

## 2021-11-27 DIAGNOSIS — D649 Anemia, unspecified: Secondary | ICD-10-CM | POA: Diagnosis not present

## 2021-11-27 DIAGNOSIS — E785 Hyperlipidemia, unspecified: Secondary | ICD-10-CM | POA: Diagnosis not present

## 2021-11-27 DIAGNOSIS — E1159 Type 2 diabetes mellitus with other circulatory complications: Secondary | ICD-10-CM | POA: Diagnosis not present

## 2021-11-27 DIAGNOSIS — H6121 Impacted cerumen, right ear: Secondary | ICD-10-CM | POA: Diagnosis not present

## 2021-11-27 DIAGNOSIS — I119 Hypertensive heart disease without heart failure: Secondary | ICD-10-CM | POA: Diagnosis not present

## 2021-11-27 DIAGNOSIS — K5790 Diverticulosis of intestine, part unspecified, without perforation or abscess without bleeding: Secondary | ICD-10-CM | POA: Diagnosis not present

## 2021-11-27 DIAGNOSIS — E119 Type 2 diabetes mellitus without complications: Secondary | ICD-10-CM | POA: Diagnosis not present

## 2021-11-27 DIAGNOSIS — D509 Iron deficiency anemia, unspecified: Secondary | ICD-10-CM | POA: Diagnosis not present

## 2021-11-27 DIAGNOSIS — N179 Acute kidney failure, unspecified: Secondary | ICD-10-CM | POA: Diagnosis not present

## 2021-11-27 DIAGNOSIS — I714 Abdominal aortic aneurysm, without rupture, unspecified: Secondary | ICD-10-CM | POA: Diagnosis not present

## 2021-11-27 DIAGNOSIS — N1832 Chronic kidney disease, stage 3b: Secondary | ICD-10-CM | POA: Diagnosis not present

## 2021-11-27 DIAGNOSIS — Z7982 Long term (current) use of aspirin: Secondary | ICD-10-CM | POA: Diagnosis not present

## 2021-11-27 DIAGNOSIS — L988 Other specified disorders of the skin and subcutaneous tissue: Secondary | ICD-10-CM | POA: Diagnosis not present

## 2021-11-27 DIAGNOSIS — D638 Anemia in other chronic diseases classified elsewhere: Secondary | ICD-10-CM | POA: Diagnosis not present

## 2021-11-27 DIAGNOSIS — I5022 Chronic systolic (congestive) heart failure: Secondary | ICD-10-CM | POA: Diagnosis not present

## 2021-12-01 ENCOUNTER — Other Ambulatory Visit: Payer: Self-pay | Admitting: *Deleted

## 2021-12-01 NOTE — Patient Outreach (Signed)
[facility]) Care Management ? ?12/01/2021 ? ?Ulas R Simko ?1935-09-08 ?[id number] ? ? ?Telephone Assessment-Unsuccessful ? ?RN attempted outreach call to pt today however unsuccessful. RN able to leave a HIPAA approved voice message requesting a call back. ? ?Will reschedule another outreach call over the next week for [facility]. ? ?Raina Mina, RN ?Care Management Coordinator ?[place] ?Main Office [phone]  ?

## 2021-12-04 DIAGNOSIS — J449 Chronic obstructive pulmonary disease, unspecified: Secondary | ICD-10-CM | POA: Diagnosis not present

## 2021-12-04 DIAGNOSIS — J9601 Acute respiratory failure with hypoxia: Secondary | ICD-10-CM | POA: Diagnosis not present

## 2021-12-04 DIAGNOSIS — J439 Emphysema, unspecified: Secondary | ICD-10-CM | POA: Diagnosis not present

## 2021-12-05 ENCOUNTER — Other Ambulatory Visit: Payer: Self-pay | Admitting: *Deleted

## 2022-01-02 ENCOUNTER — Other Ambulatory Visit: Payer: Self-pay | Admitting: *Deleted

## 2022-01-02 NOTE — Patient Outreach (Signed)
?Harry Norris) Care Management ?Telephonic RN Care Manager Note ? ? ?01/02/2022 ?Name:  Harry Norris MRN:  [id number] DOB:  1936/05/06 ? ?Summary: ?Pt continue to well remains in the GREEN zone with his COPD as he manages his COPD independently. No needs or acute issues over the past month and pt has verified ongoing support system and medication adherence with no related problems. ? ?Recommendations/Changes made from today's visit: ?Will continue to stress the importance of COPD management and medication adherence related to his COPD. Will verified pt's awareness of what to do if acute symptoms should occur. ? ?Subjective: ?Harry Norris is an 86 y.o. year old male who is a primary patient of Harry Dress, MD. The care management team was consulted for assistance with care management and/or care coordination needs.   ? ?Telephonic RN Care Manager completed Telephone Visit today. ? ?Objective:  ? ?Medications Reviewed Today   ? ? Reviewed by [name], RN (Registered Nurse) on 10/02/21 at 1521  Med List Status: <None>  ? ?Medication Order Taking? Sig Documenting Provider Last Dose Status Informant  ?albuterol (VENTOLIN HFA) 108 (90 Base) MCG/ACT inhaler [id number] Yes Inhale 2 puffs into the lungs every 6 (six) hours as needed for wheezing or shortness of breath. [provider] Taking Active Child  ?amoxicillin-clavulanate (AUGMENTIN) 500-125 MG tablet [id number] Yes Take 1 tablet (500 mg total) by mouth 2 (two) times daily for 4 days. Gerrit Heck, MD Taking Active   ?atorvastatin (LIPITOR) 40 MG tablet [id number] Yes Take 40 mg by mouth at bedtime. [provider] Taking Active Child  ?fenofibrate 160 MG tablet [id number] Yes Take 160 mg by mouth daily. [provider] Taking Active Child  ?FLOVENT HFA 110 MCG/ACT inhaler [id number] Yes Inhale 2 puffs into the lungs 2 (two) times daily. [provider] Taking Active Child  ?formoterol (PERFOROMIST)  20 MCG/2ML nebulizer solution [id number] Yes Take 20 mcg by nebulization 2 (two) times daily. [provider] Taking Active Child  ?Melatonin 10 MG TABS [id number] Yes Take 1 tablet by mouth at bedtime. [provider] Taking Active Child  ?Multiple Vitamins-Minerals (CENTRUM SILVER 50+MEN) TABS [id number] Yes Take 1 tablet by mouth daily with breakfast. [provider] Taking Active Child  ?naphazoline-glycerin (CLEAR EYES REDNESS) 0.012-0.2 % SOLN [id number] Yes Place 1-2 drops into both eyes 4 (four) times daily as needed for eye irritation. Gerrit Heck, MD Taking Active   ?Omega-3 Fatty Acids (FISH OIL) 1000 MG CAPS [id number] Yes Take 1,000 mg by mouth 2 (two) times daily. [provider] Taking Active Child  ?OXYGEN [id number] Yes Inhale 2 L/min into the lungs as needed (for shortness of breath). [provider] Taking Active Child  ?pantoprazole (PROTONIX) 40 MG tablet [id number] Yes Take 1 tablet (40 mg total) by mouth 2 (two) times daily. Gerrit Heck, MD Taking Active   ?vitamin C (ASCORBIC ACID) 500 MG tablet [id number] Yes Take 500 mg by mouth daily. [provider] Taking Active Child  ?vitamin E 400 UNIT capsule [id number] Yes Take 400 Units by mouth daily. [provider] Taking Active Child  ? ?  ?  ? ?  ? ? ? ?SDOH:  (Social Determinants of Health) assessments and interventions performed:  ? ? ? ?Care Plan ? ?Review of patient past medical history, allergies, medications, health status, including review of consultants reports, laboratory and other test data, was performed as part of comprehensive evaluation for care management services.  ? ?Care Plan : RN  Care Manager Plan of Care  ?Updates made by [name], RN since 01/02/2022 12:00 AM  ?  ? ?Problem: Knowledge Deficit related to COPD and care coordination needs   ?Priority: High  ?  ? ?Long-Range Goal: Development of care for management of COPD   ?Start Date: 08/07/2021  ?Expected  End Date: 04/30/2022  ?This Visit's Progress: On track  ?Recent Progress: On track  ?Priority: High  ?Note:   ?Current Barriers:  ?Knowledge Deficits related to plan of care for management of COPD  ? ?RNCM Clinical Goal(s):  ?Patient will verbalize understanding of plan for management of COPD as evidenced by awareness of what to do if exacerbated symptoms are encountered. ?continue to work with RN Care Manager to address care management and care coordination needs related to  COPD as evidenced by adherence to CM Team Scheduled appointments through collaboration with RN Care manager, provider, and care team.  ? ?Interventions: ?Inter-disciplinary care team collaboration (see longitudinal plan of care) ?Evaluation of current treatment plan related to  self management and patient's adherence to plan as established by provider ? ? ?COPD Interventions:  (Status:  Goal on track:  Yes.) Long Term Goal ?Provided patient with basic written and verbal COPD education on self care/management/and exacerbation prevention ?Advised patient to track and manage COPD triggers ?Provided instruction about proper use of medications used for management of COPD including inhalers ?Advised patient to self assesses COPD action plan zone and make appointment with provider if in the yellow zone for 48 hours without improvement ?Advised patient to engage in light exercise as tolerated 3-5 days a week to aid in the the management of COPD ?Provided education about and advised patient to utilize infection prevention strategies to reduce risk of respiratory infection ?Discussed the importance of adequate rest and management of fatigue with COPD ?. ?09/02/2021 Update: Pt doing well with no acute issues on his birthday today. Pt breathing well with no related incidents or difficulty breathing. Pt continue to take all medications to prevent acute events. Reiterated on the COPD action zone as pt remains in the GREEN zone with no acute issues. Pt remains  aware of who to call with any acute symptoms and has a good support system available. ? ?10/02/2021 Updates: pt with recent hospitalization and d/c on 1/30. Pt has followed up with his provider Dr. Delena Bali on 1/31 pending other appointments that have been reviewed. Spoke with both the pt and his primary caregiver son Jori Moll) who verified all medication changes and adherence to all medications prescribed. Pt recovering well with no acute needs at this time. Caregiver states previous HHealth services are planned to resume with PT/OT and caregivers plan to place pt into ALF [facility] in [place]). States that have already visited the facility for placement however pending paperwork and pending other possible benefits pt may have access to receiving with the [place]. RN discussed life alert if the process will take awhile due to pt's ongoing weakness and son states pt needs someone with his at all times for adherence with his medications. Denies any issues with his COPD and pt continue to all inhalers and only a few days left on the antibiotics provider for the ordered 4 days. Pt remains in the GREEN zone with no acute breathing problems and pt only using his home O2 via 2 liters PRN. No other needs presented as pt continue to request monthly follow up calls instead of weekly transition of care. Will follow up in a few  weeks concerning pt's progress with the ALF placement process.  ? ?10/30/2021 Updates: Pt reports he is doing well in managing her ongoing COPD. Verified pt remains in the GREEN zone using his inhalers as prescribed with no delays. Verified medication adherence and all upcoming appointments with sufficient transportation. Pt continue to have support from his so and has no added needs at this time. Denies any falls or related injuries as pt completed recent PT/OT services. Remains pending for ALF placement in [place].  All inquires and questions addressed with no acute need since the last  conversation. Will follow up next month with ongoing case management services. ? ?12/05/2021 Updates: Pt reports he is doing well and has not used his emergence inhaler on his control medication. No acute needs

## 2022-01-03 DIAGNOSIS — J9601 Acute respiratory failure with hypoxia: Secondary | ICD-10-CM | POA: Diagnosis not present

## 2022-01-03 DIAGNOSIS — J449 Chronic obstructive pulmonary disease, unspecified: Secondary | ICD-10-CM | POA: Diagnosis not present

## 2022-01-03 DIAGNOSIS — J439 Emphysema, unspecified: Secondary | ICD-10-CM | POA: Diagnosis not present

## 2022-01-07 DIAGNOSIS — J449 Chronic obstructive pulmonary disease, unspecified: Secondary | ICD-10-CM | POA: Diagnosis not present

## 2022-01-09 ENCOUNTER — Ambulatory Visit: Payer: Medicare Other | Admitting: *Deleted

## 2022-02-02 ENCOUNTER — Other Ambulatory Visit: Payer: Self-pay | Admitting: *Deleted

## 2022-02-02 NOTE — Patient Outreach (Signed)
[place] [facility]) Care Management  02/02/2022  JERIS ROSER [birth year] [id number]   Telephone Assessment-Unsuccessful  RN attempted outreach call to pt today however unsuccessful. RN able to leave a HIPAA approved voice message requesting a call back.  RN will follow up once again over the next week for ongoing [facility] services.  Raina Mina, RN Care Management Coordinator [place] Office [phone]

## 2022-02-03 DIAGNOSIS — J449 Chronic obstructive pulmonary disease, unspecified: Secondary | ICD-10-CM | POA: Diagnosis not present

## 2022-02-03 DIAGNOSIS — J439 Emphysema, unspecified: Secondary | ICD-10-CM | POA: Diagnosis not present

## 2022-02-03 DIAGNOSIS — J9601 Acute respiratory failure with hypoxia: Secondary | ICD-10-CM | POA: Diagnosis not present

## 2022-02-09 ENCOUNTER — Other Ambulatory Visit: Payer: Self-pay | Admitting: *Deleted

## 2022-02-09 NOTE — Patient Outreach (Signed)
[place] [facility]) Care Management Telephonic RN Care Manager Note   02/09/2022 Name:  Harry Norris MRN:  [id number] DOB:  23-Jan-1936  Summary: Pt continues to do well with no acute needs or related issues. Pt reports no events or flare ups on his COPD (GREEN zone). Pt uses his inhalers accordingly with no delays or issues according to the discussed plan of care.  Recommendations/Changes made from today's visit: Will continue to encouraged adherence with the discussed plan of care and inquire on any other needs or issues that may need further assistance.  Subjective: Harry Norris is an 86 y.o. year old male who is a primary patient of [name], MD. The care management team was consulted for assistance with care management and/or care coordination needs.    Telephonic RN Care Manager completed Telephone Visit today.  Objective:   Medications Reviewed Today     Reviewed by [name], RN (Registered Nurse) on 10/02/21 at 35  Med List Status: <None>   Medication Order Taking? Sig Documenting Provider Last Dose Status Informant  albuterol (VENTOLIN HFA) 108 (90 Base) MCG/ACT inhaler [id number] Yes Inhale 2 puffs into the lungs every 6 (six) hours as needed for wheezing or shortness of breath. [provider] Taking Active Child  amoxicillin-clavulanate (AUGMENTIN) 500-125 MG tablet [id number] Yes Take 1 tablet (500 mg total) by mouth 2 (two) times daily for 4 days. Gerrit Heck, MD Taking Active   atorvastatin (LIPITOR) 40 MG tablet [id number] Yes Take 40 mg by mouth at bedtime. [provider] Taking Active Child  fenofibrate 160 MG tablet [id number] Yes Take 160 mg by mouth daily. [provider] Taking Active Child  FLOVENT HFA 110 MCG/ACT inhaler [id number] Yes Inhale 2 puffs into the lungs 2 (two) times daily. [provider] Taking Active Child  formoterol (PERFOROMIST) 20 MCG/2ML nebulizer solution [id number] Yes Take  20 mcg by nebulization 2 (two) times daily. [provider] Taking Active Child  Melatonin 10 MG TABS [id number] Yes Take 1 tablet by mouth at bedtime. [provider] Taking Active Child  Multiple Vitamins-Minerals (CENTRUM SILVER 50+MEN) TABS [id number] Yes Take 1 tablet by mouth daily with breakfast. [provider] Taking Active Child  naphazoline-glycerin (CLEAR EYES REDNESS) 0.012-0.2 % SOLN [id number] Yes Place 1-2 drops into both eyes 4 (four) times daily as needed for eye irritation. Gerrit Heck, MD Taking Active   Omega-3 Fatty Acids (FISH OIL) 1000 MG CAPS [id number] Yes Take 1,000 mg by mouth 2 (two) times daily. [provider] Taking Active Child  OXYGEN [id number] Yes Inhale 2 L/min into the lungs as needed (for shortness of breath). [provider] Taking Active Child  pantoprazole (PROTONIX) 40 MG tablet [id number] Yes Take 1 tablet (40 mg total) by mouth 2 (two) times daily. Gerrit Heck, MD Taking Active   vitamin C (ASCORBIC ACID) 500 MG tablet [id number] Yes Take 500 mg by mouth daily. [provider] Taking Active Child  vitamin E 400 UNIT capsule [id number] Yes Take 400 Units by mouth daily. [provider] Taking Active Child             SDOH:  (Social Determinants of Health) assessments and interventions performed:     Care Plan  Review of patient past medical history, allergies, medications, health status, including review of consultants reports, laboratory and other test data, was performed as part of comprehensive evaluation for care management services.   Care Plan : [name]  Updates made by [name], RN since 02/09/2022 12:00 AM     Problem: Knowledge Deficit related to COPD and care coordination needs   Priority: High     Long-Range Goal: Development of care for management of COPD   Start Date: 08/07/2021  Expected End Date: 04/30/2022  This Visit's Progress: On  track  Recent Progress: On track  Priority: High  Note:   Current Barriers:  Knowledge Deficits related to plan of care for management of COPD   RNCM Clinical Goal(s):  Patient will verbalize understanding of plan for management of COPD as evidenced by awareness of what to do if exacerbated symptoms are encountered. continue to work with RN Care Manager to address care management and care coordination needs related to  COPD as evidenced by adherence to CM Team Scheduled appointments through collaboration with RN Care manager, provider, and care team.   Interventions: Inter-disciplinary care team collaboration (see longitudinal plan of care) Evaluation of current treatment plan related to  self management and patient's adherence to plan as established by provider   COPD Interventions:  (Status:  Goal on track:  Yes.) Long Term Goal Provided patient with basic written and verbal COPD education on self care/management/and exacerbation prevention Advised patient to track and manage COPD triggers Provided instruction about proper use of medications used for management of COPD including inhalers Advised patient to self assesses COPD action plan zone and make appointment with provider if in the yellow zone for 48 hours without improvement Advised patient to engage in light exercise as tolerated 3-5 days a week to aid in the the management of COPD Provided education about and advised patient to utilize infection prevention strategies to reduce risk of respiratory infection Discussed the importance of adequate rest and management of fatigue with COPD . 09/02/2021 Update: Pt doing well with no acute issues on his birthday today. Pt breathing well with no related incidents or difficulty breathing. Pt continue to take all medications to prevent acute events. Reiterated on the COPD action zone as pt remains in the GREEN zone with no acute issues. Pt remains aware of who to call with any acute symptoms and  has a good support system available.  10/02/2021 Updates: pt with recent hospitalization and d/c on 1/30. Pt has followed up with his provider Dr. Delena Bali on 1/31 pending other appointments that have been reviewed. Spoke with both the pt and his primary caregiver son Jori Moll) who verified all medication changes and adherence to all medications prescribed. Pt recovering well with no acute needs at this time. Caregiver states previous HHealth services are planned to resume with PT/OT and caregivers plan to place pt into ALF [facility] in [place]). States that have already visited the facility for placement however pending paperwork and pending other possible benefits pt may have access to receiving with the [place]. RN discussed life alert if the process will take awhile due to pt's ongoing weakness and son states pt needs someone with his at all times for adherence with his medications. Denies any issues with his COPD and pt continue to all inhalers and only a few days left on the antibiotics provider for the ordered 4 days. Pt remains in the GREEN zone with no acute breathing problems and pt only using his home O2 via 2 liters PRN. No other needs presented as pt continue to request monthly follow up calls instead of weekly transition of care. Will follow up in a few weeks concerning pt's progress with the  ALF placement process.   10/30/2021 Updates: Pt reports he is doing well in managing her ongoing COPD. Verified pt remains in the GREEN zone using his inhalers as prescribed with no delays. Verified medication adherence and all upcoming appointments with sufficient transportation. Pt continue to have support from his so and has no added needs at this time. Denies any falls or related injuries as pt completed recent PT/OT services. Remains pending for ALF placement in .  All inquires and questions addressed with no acute need since the last conversation. Will follow up next month with ongoing  case management services.  12/05/2021 Updates: Pt reports he is doing well and has not used his emergence inhaler on his control medication. No acute needs as pt continue to have a supportive family with no acute needs today. Pt remains in the GREEN zone with no encountered symptoms from his COPD.  01/02/2022 Update: Spoke with pt today who indicates he is doing well with no acute needs or issues to address today. Pt denies any COPD related issues and remains in the GREEN zone with no distressful breathing. Pt remains adherent with appointments and medications with all his refills. Review the plan of care once again as pt with clear understanding of what to do if acute symptoms should occur. Will continue to communicate with pt's provider Dr. Delena Bali and stress the importance of avoiding environmental factors that will result in flare-ups of his COPD (verbalized an understanding).  02/09/2022 Update: Spoke with pt today concerning the discussed plan of care. Pt reports he continue to do well with no acute events or related issues. Pt continue to manage his ongoing COPD with no acute events and takes his medications with no delays and has a good support system from his two sons Jori Moll and [place]). Pt remains in the GREEN zone related to his COPD with no flare ups.    Patient Goals/Self-Care Activities: Take all medications as prescribed Attend all scheduled provider appointments Call pharmacy for medication refills 3-7 days in advance of running out of medications Perform all self care activities independently  Perform IADL's (shopping, preparing meals, housekeeping, managing finances) independently Call provider office for new concerns or questions   Follow Up Plan:  Telephone follow up appointment with care management team member scheduled for:  July 2023 The patient has been provided with contact information for the care management team and has been advised to call with any health related questions  or concerns.        Raina Mina, RN Care Management Coordinator [place] Office [phone]

## 2022-02-10 DIAGNOSIS — H02135 Senile ectropion of left lower eyelid: Secondary | ICD-10-CM | POA: Diagnosis not present

## 2022-02-10 DIAGNOSIS — H02132 Senile ectropion of right lower eyelid: Secondary | ICD-10-CM | POA: Diagnosis not present

## 2022-02-10 DIAGNOSIS — H04203 Unspecified epiphora, bilateral lacrimal glands: Secondary | ICD-10-CM | POA: Diagnosis not present

## 2022-02-17 DIAGNOSIS — I5022 Chronic systolic (congestive) heart failure: Secondary | ICD-10-CM | POA: Diagnosis not present

## 2022-02-17 DIAGNOSIS — D638 Anemia in other chronic diseases classified elsewhere: Secondary | ICD-10-CM | POA: Diagnosis not present

## 2022-02-17 DIAGNOSIS — J449 Chronic obstructive pulmonary disease, unspecified: Secondary | ICD-10-CM | POA: Diagnosis not present

## 2022-02-17 DIAGNOSIS — N1832 Chronic kidney disease, stage 3b: Secondary | ICD-10-CM | POA: Diagnosis not present

## 2022-02-17 DIAGNOSIS — E785 Hyperlipidemia, unspecified: Secondary | ICD-10-CM | POA: Diagnosis not present

## 2022-02-17 DIAGNOSIS — D509 Iron deficiency anemia, unspecified: Secondary | ICD-10-CM | POA: Diagnosis not present

## 2022-02-17 DIAGNOSIS — E1159 Type 2 diabetes mellitus with other circulatory complications: Secondary | ICD-10-CM | POA: Diagnosis not present

## 2022-02-18 ENCOUNTER — Other Ambulatory Visit: Payer: Self-pay | Admitting: *Deleted

## 2022-02-18 NOTE — Patient Outreach (Signed)
[place] [facility]) Care Management Telephonic RN Care Manager Note   02/18/2022 Name:  Harry Norris MRN:  [id number] DOB:  05-28-1936  Summary: Pt has met all goals with no acute issues or needs at this time. Pt continue to manage his COPD adherent with all his medications.   Recommendations/Changes made from today's visit: Will notify primary of pt's disposition with [facility] services and verified pt is aware of what to do if acute. No additional issues or needs presented at this time.  Case will be closed via [facility] services.  Subjective: Harry Norris is an 86 y.o. year old male who is a primary patient of [name], MD. The care management team was consulted for assistance with care management and/or care coordination needs.    Telephonic RN Care Manager completed Telephone Visit today.  Objective:   Medications Reviewed Today     Reviewed by [name], RN (Registered Nurse) on 10/02/21 at 24  Med List Status: <None>   Medication Order Taking? Sig Documenting Provider Last Dose Status Informant  albuterol (VENTOLIN HFA) 108 (90 Base) MCG/ACT inhaler [id number] Yes Inhale 2 puffs into the lungs every 6 (six) hours as needed for wheezing or shortness of breath. [provider] Taking Active Child  amoxicillin-clavulanate (AUGMENTIN) 500-125 MG tablet [id number] Yes Take 1 tablet (500 mg total) by mouth 2 (two) times daily for 4 days. Harry Heck, MD Taking Active   atorvastatin (LIPITOR) 40 MG tablet [id number] Yes Take 40 mg by mouth at bedtime. [provider] Taking Active Child  fenofibrate 160 MG tablet [id number] Yes Take 160 mg by mouth daily. [provider] Taking Active Child  FLOVENT HFA 110 MCG/ACT inhaler [id number] Yes Inhale 2 puffs into the lungs 2 (two) times daily. [provider] Taking Active Child  formoterol (PERFOROMIST) 20 MCG/2ML nebulizer solution [id number] Yes Take 20 mcg by nebulization 2 (two)  times daily. [provider] Taking Active Child  Melatonin 10 MG TABS [id number] Yes Take 1 tablet by mouth at bedtime. [provider] Taking Active Child  Multiple Vitamins-Minerals (CENTRUM SILVER 50+MEN) TABS [id number] Yes Take 1 tablet by mouth daily with breakfast. [provider] Taking Active Child  naphazoline-glycerin (CLEAR EYES REDNESS) 0.012-0.2 % SOLN [id number] Yes Place 1-2 drops into both eyes 4 (four) times daily as needed for eye irritation. Harry Heck, MD Taking Active   Omega-3 Fatty Acids (FISH OIL) 1000 MG CAPS [id number] Yes Take 1,000 mg by mouth 2 (two) times daily. [provider] Taking Active Child  OXYGEN [id number] Yes Inhale 2 L/min into the lungs as needed (for shortness of breath). [provider] Taking Active Child  pantoprazole (PROTONIX) 40 MG tablet [id number] Yes Take 1 tablet (40 mg total) by mouth 2 (two) times daily. Harry Heck, MD Taking Active   vitamin C (ASCORBIC ACID) 500 MG tablet [id number] Yes Take 500 mg by mouth daily. [provider] Taking Active Child  vitamin E 400 UNIT capsule [id number] Yes Take 400 Units by mouth daily. [provider] Taking Active Child             SDOH:  (Social Determinants of Health) assessments and interventions performed:     Care Plan  Review of patient past medical history, allergies, medications, health status, including review of consultants reports, laboratory and other test data, was performed as part of comprehensive evaluation for care management services.   Care Plan : [name]  Updates  made by [name], RN since 02/18/2022 12:00 AM     Problem: Knowledge Deficit related to COPD and care coordination needs   Priority: High     Long-Range Goal: Development of care for management of COPD Completed 02/18/2022  Start Date: 08/07/2021  Expected End Date: 04/30/2022  This Visit's Progress: On track  Recent  Progress: On track  Priority: High  Note:   Current Barriers:  Knowledge Deficits related to plan of care for management of COPD   RNCM Clinical Goal(s):  Patient will verbalize understanding of plan for management of COPD as evidenced by awareness of what to do if exacerbated symptoms are encountered. continue to work with RN Care Manager to address care management and care coordination needs related to  COPD as evidenced by adherence to CM Team Scheduled appointments through collaboration with RN Care manager, provider, and care team.   Interventions: Inter-disciplinary care team collaboration (see longitudinal plan of care) Evaluation of current treatment plan related to  self management and patient's adherence to plan as established by provider   COPD Interventions:  (Status:  Goal on track:  Yes.) Long Term Goal Provided patient with basic written and verbal COPD education on self care/management/and exacerbation prevention Advised patient to track and manage COPD triggers Provided instruction about proper use of medications used for management of COPD including inhalers Advised patient to self assesses COPD action plan zone and make appointment with provider if in the yellow zone for 48 hours without improvement Advised patient to engage in light exercise as tolerated 3-5 days a week to aid in the the management of COPD Provided education about and advised patient to utilize infection prevention strategies to reduce risk of respiratory infection Discussed the importance of adequate rest and management of fatigue with COPD . 09/02/2021 Update: Pt doing well with no acute issues on his birthday today. Pt breathing well with no related incidents or difficulty breathing. Pt continue to take all medications to prevent acute events. Reiterated on the COPD action zone as pt remains in the GREEN zone with no acute issues. Pt remains aware of who to call with any acute symptoms and has a good  support system available.  10/02/2021 Updates: pt with recent hospitalization and d/c on 1/30. Pt has followed up with his provider Dr. Delena Bali on 1/31 pending other appointments that have been reviewed. Spoke with both the pt and his primary caregiver son Jori Moll) who verified all medication changes and adherence to all medications prescribed. Pt recovering well with no acute needs at this time. Caregiver states previous HHealth services are planned to resume with PT/OT and caregivers plan to place pt into ALF [facility] in [place]). States that have already visited the facility for placement however pending paperwork and pending other possible benefits pt may have access to receiving with the [place]. RN discussed life alert if the process will take awhile due to pt's ongoing weakness and son states pt needs someone with his at all times for adherence with his medications. Denies any issues with his COPD and pt continue to all inhalers and only a few days left on the antibiotics provider for the ordered 4 days. Pt remains in the GREEN zone with no acute breathing problems and pt only using his home O2 via 2 liters PRN. No other needs presented as pt continue to request monthly follow up calls instead of weekly transition of care. Will follow up in a few weeks concerning pt's progress with the  ALF placement process.   10/30/2021 Updates: Pt reports he is doing well in managing her ongoing COPD. Verified pt remains in the GREEN zone using his inhalers as prescribed with no delays. Verified medication adherence and all upcoming appointments with sufficient transportation. Pt continue to have support from his so and has no added needs at this time. Denies any falls or related injuries as pt completed recent PT/OT services. Remains pending for ALF placement in [place].  All inquires and questions addressed with no acute need since the last conversation. Will follow up next month with ongoing case  management services.  12/05/2021 Updates: Pt reports he is doing well and has not used his emergence inhaler on his control medication. No acute needs as pt continue to have a supportive family with no acute needs today. Pt remains in the GREEN zone with no encountered symptoms from his COPD.  01/02/2022 Update: Spoke with pt today who indicates he is doing well with no acute needs or issues to address today. Pt denies any COPD related issues and remains in the GREEN zone with no distressful breathing. Pt remains adherent with appointments and medications with all his refills. Review the plan of care once again as pt with clear understanding of what to do if acute symptoms should occur. Will continue to communicate with pt's provider Dr. Delena Bali and stress the importance of avoiding environmental factors that will result in flare-ups of his COPD (verbalized an understanding).  02/09/2022 Update: Spoke with pt today concerning the discussed plan of care. Pt reports he continue to do well with no acute events or related issues. Pt continue to manage his ongoing COPD with no acute events and takes his medications with no delays and has a good support system from his two sons Jori Moll and [place]). Pt remains in the GREEN zone related to his COPD with no flare ups.    Patient Goals/Self-Care Activities: Take all medications as prescribed Attend all scheduled provider appointments Call pharmacy for medication refills 3-7 days in advance of running out of medications Perform all self care activities independently  Perform IADL's (shopping, preparing meals, housekeeping, managing finances) independently Call provider office for new concerns or questions   Follow Up Plan:  Telephone follow up appointment with care management team member scheduled for:  July 2023 The patient has been provided with contact information for the care management team and has been advised to call with any health related questions or  concerns.    6/23: Case closure with all goals met. Pt continues to have supportive sons [name] and Jori Moll). Pt continue to manage his COPD with no flare-up or related issues. RN will notify the provider of pt's disposition with [facility] services with all goals met. No additional needs at this time.     Raina Mina, RN Care Management Coordinator [place] Office [phone]

## 2022-02-19 DIAGNOSIS — J449 Chronic obstructive pulmonary disease, unspecified: Secondary | ICD-10-CM | POA: Diagnosis not present

## 2022-03-05 DIAGNOSIS — J449 Chronic obstructive pulmonary disease, unspecified: Secondary | ICD-10-CM | POA: Diagnosis not present

## 2022-03-05 DIAGNOSIS — J9601 Acute respiratory failure with hypoxia: Secondary | ICD-10-CM | POA: Diagnosis not present

## 2022-03-05 DIAGNOSIS — J439 Emphysema, unspecified: Secondary | ICD-10-CM | POA: Diagnosis not present

## 2022-03-11 ENCOUNTER — Ambulatory Visit: Payer: Medicare Other | Admitting: *Deleted

## 2022-03-12 DIAGNOSIS — H02132 Senile ectropion of right lower eyelid: Secondary | ICD-10-CM | POA: Diagnosis not present

## 2022-03-12 DIAGNOSIS — H02135 Senile ectropion of left lower eyelid: Secondary | ICD-10-CM | POA: Diagnosis not present

## 2022-03-12 DIAGNOSIS — Z01818 Encounter for other preprocedural examination: Secondary | ICD-10-CM | POA: Diagnosis not present

## 2022-03-18 ENCOUNTER — Other Ambulatory Visit: Payer: Self-pay

## 2022-03-18 ENCOUNTER — Telehealth: Payer: Self-pay | Admitting: Cardiology

## 2022-03-18 DIAGNOSIS — D126 Benign neoplasm of colon, unspecified: Secondary | ICD-10-CM | POA: Insufficient documentation

## 2022-03-18 DIAGNOSIS — D649 Anemia, unspecified: Secondary | ICD-10-CM | POA: Insufficient documentation

## 2022-03-18 DIAGNOSIS — C801 Malignant (primary) neoplasm, unspecified: Secondary | ICD-10-CM | POA: Insufficient documentation

## 2022-03-18 DIAGNOSIS — J40 Bronchitis, not specified as acute or chronic: Secondary | ICD-10-CM | POA: Insufficient documentation

## 2022-03-18 DIAGNOSIS — I10 Essential (primary) hypertension: Secondary | ICD-10-CM | POA: Insufficient documentation

## 2022-03-18 DIAGNOSIS — N200 Calculus of kidney: Secondary | ICD-10-CM | POA: Insufficient documentation

## 2022-03-18 DIAGNOSIS — D638 Anemia in other chronic diseases classified elsewhere: Secondary | ICD-10-CM | POA: Insufficient documentation

## 2022-03-18 NOTE — Telephone Encounter (Signed)
I will ask our [place] office to please help schedule an appt for pre op for the pt. I will update the requesting office the pt will need an in office appt.

## 2022-03-18 NOTE — Telephone Encounter (Signed)
ADDENDUM: WANTED TO NOTE THAT DR. Hollice Espy HAS REQUESTED ASA HOLD  THROUGH THE PT'S PCP

## 2022-03-18 NOTE — Telephone Encounter (Signed)
   Pre-operative Risk Assessment    Patient Name: Harry Norris  DOB: [birth year] MRN: [id number]      Request for Surgical Clearance    Procedure:   Ectropion repair  Date of Surgery:  Clearance 03/27/22                                 Surgeon:    Dr. Delmar Landau Group or Practice Name:  [facility] Phone number:  [phone] Fax number:  [phone]   Type of Clearance Requested:   - Medical    Type of Anesthesia:  MAC   Additional requests/questions:    Caller is requesting cardiac clearance.     Signed, Heloise Beecham   03/18/2022, 10:34 AM

## 2022-03-18 NOTE — Telephone Encounter (Signed)
Has appt 03/19/22 with Dr. Geraldo Pitter for pre op clearance. I will send notes to MD for upcoming appt. Will send FYI to requesting office the pt has appt 03/19/22.

## 2022-03-18 NOTE — Telephone Encounter (Signed)
   Name: Harry Norris  DOB: 29-Jun-1936  MRN: [id number]  Primary Cardiologist: None  Chart reviewed as part of pre-operative protocol coverage. Because of Harry Norris's past medical history and time since last visit, he will require a follow-up in-office visit in order to better assess preoperative cardiovascular risk.  Patient was last seen in the office in 2021.  Pre-op covering staff: - Please schedule appointment and call patient to inform them. If patient already had an upcoming appointment within acceptable timeframe, please add "pre-op clearance" to the appointment notes so provider is aware. - Please contact requesting surgeon's office via preferred method (i.e, phone, fax) to inform them of need for appointment prior to surgery.  Lenna Sciara, NP  03/18/2022, 11:01 AM

## 2022-03-19 ENCOUNTER — Ambulatory Visit: Payer: Medicare Other | Admitting: Cardiology

## 2022-03-19 ENCOUNTER — Encounter: Payer: Self-pay | Admitting: Cardiology

## 2022-03-19 ENCOUNTER — Telehealth: Payer: Self-pay | Admitting: Cardiology

## 2022-03-19 VITALS — BP 124/61 | HR 74 | Ht 69.0 in | Wt 156.8 lb

## 2022-03-19 DIAGNOSIS — E782 Mixed hyperlipidemia: Secondary | ICD-10-CM | POA: Diagnosis not present

## 2022-03-19 DIAGNOSIS — N289 Disorder of kidney and ureter, unspecified: Secondary | ICD-10-CM | POA: Diagnosis not present

## 2022-03-19 DIAGNOSIS — I429 Cardiomyopathy, unspecified: Secondary | ICD-10-CM | POA: Diagnosis not present

## 2022-03-19 DIAGNOSIS — I10 Essential (primary) hypertension: Secondary | ICD-10-CM

## 2022-03-19 NOTE — Patient Instructions (Signed)

## 2022-03-19 NOTE — Progress Notes (Signed)
Cardiology Office Note:    Date:  03/19/2022   ID:  Harry Norris, DOB 1936/02/28, MRN [id number]  PCP:  [name], MD  Cardiologist:  Jenean Lindau, MD   Referring MD: [name], MD    ASSESSMENT:    1. Cardiomyopathy, unspecified type ([place])   2. Essential hypertension   3. Mixed hyperlipidemia   4. Renal insufficiency    PLAN:    In order of problems listed above:  Primary prevention stressed with the patient.  Importance of compliance with diet and medication stressed any vocalized understanding. Advanced cardiomyopathy: Patient has multiple comorbidities therefore guideline directed medical therapy may not be possible.  However he is receiving ACE inhibitor and tolerating it well.  Interestingly the last renal function has improved compared to previous.  I reviewed this with him KPN sheet.  Congestive heart failure education was given to the patient.  Salt intake issues were discussed.  It would be best if he weighs himself on a regular basis. Essential hypertension: Blood pressure stable and diet was emphasized. Mixed dyslipidemia: On statin therapy and followed closely by his providers. He will be seen in follow-up appointment in 3 months by Dr. Bettina Gavia or earlier if he has any concerns.  Patient had multiple questions which were answered to his satisfaction.  On multiple occasions I asked him if he had any questions or if he has any significant symptoms from a congestive heart failure standpoint and his answer was negative.   Medication Adjustments/Labs and Tests Ordered: Current medicines are reviewed at length with the patient today.  Concerns regarding medicines are outlined above.  No orders of the defined types were placed in this encounter.  No orders of the defined types were placed in this encounter.    No chief complaint on file.    History of Present Illness:    Harry Norris is a 86 y.o. male.  Patient has past medical history of  essential hypertension, mixed dyslipidemia, renal insufficiency and advanced cardiomyopathy.  He denies any problems at this time and takes care of activities of daily living.  He is an elderly frail appearing gentleman and ambulates with a walker.  At the time of my evaluation, the patient is alert awake oriented and in no distress.  Past Medical History:  Diagnosis Date   AAA (abdominal aortic aneurysm) ([place])    Acute hypoxemic respiratory failure ([place]) 07/12/2021   Anemia    Asthma 02/07/2018   Benign neoplasm of colon    Blood in the stool    Bradycardia 12/19/2018   Bronchitis    Cancer ([place])    prostate   Cardiomyopathy ([place]) 12/18/2015   Carotid artery occlusion    Chronic obstructive pulmonary disease with acute exacerbation ([place]) 07/12/2021   Chronic rhinitis 02/26/2016   Chronic venous insufficiency 12/18/2015   CKD (chronic kidney disease), stage III ([place]) 07/12/2021   Community acquired pneumonia of right lower lobe of lung 07/12/2021   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   PFTs 01/18/14:  FeV1 1.58 45% FVC 2.0 50% FeV1/FVC 79% Fef 25-75 795 ( 40% improved after BD) CXR: Copd changes ONO 03/07/14 desats to <88% on RA    Diarrhea    Diverticulitis    DM type 2 (diabetes mellitus, type 2) ([place])    Fatigue 12/12/2018   Hyperlipidemia    Hypertension    Hypertensive heart disease    Hypoxemia 04/10/2014   Overview:  Last Assessment & Plan:  Cont nocturnal oxygen therapy 2L    Influenza 07/29/2021   Kidney stone    LBBB (left bundle branch block) 12/18/2015   Leg swelling 11/28/2013   Orthostatic hypotension 12/19/2018   Other symptoms involving cardiovascular system 11/28/2013   Pressure injury of skin 07/30/2021   Prostate cancer ([place])    prostate    Renal insufficiency 09/26/2021   Respiratory failure ([place]) 07/29/2021   Sepsis ([place]) 07/12/2021   Syncope 12/18/2015   Varicose veins of lower extremities with other complications 66/44/0347    Past Surgical  History:  Procedure Laterality Date    cardiac catheterization  may 2011   CATARACT EXTRACTION Bilateral    ENDOVENOUS ABLATION SAPHENOUS VEIN W/ LASER Right 03-29-2014   EVLA right greater saphenous vein     gold seed implants  2011   treatment of prostate cancer   HERNIA REPAIR Bilateral    LITHOTRIPSY  2010   right ear surgery      Current Medications: Current Meds  Medication Sig   albuterol (VENTOLIN HFA) 108 (90 Base) MCG/ACT inhaler Inhale 2 puffs into the lungs every 6 (six) hours as needed for wheezing or shortness of breath.   atorvastatin (LIPITOR) 40 MG tablet Take 40 mg by mouth at bedtime.   fenofibrate 160 MG tablet Take 160 mg by mouth daily.   FLOVENT HFA 110 MCG/ACT inhaler Inhale 2 puffs into the lungs 2 (two) times daily.   formoterol (PERFOROMIST) 20 MCG/2ML nebulizer solution Take 20 mcg by nebulization 2 (two) times daily.   furosemide (LASIX) 40 MG tablet Take 40 mg by mouth 2 (two) times daily.   losartan (COZAAR) 100 MG tablet Take 100 mg by mouth daily.   Melatonin 10 MG TABS Take 1 tablet by mouth at bedtime.   Multiple Vitamins-Minerals (CENTRUM SILVER 50+MEN) TABS Take 1 tablet by mouth daily with breakfast.   naphazoline-glycerin (CLEAR EYES REDNESS) 0.012-0.2 % SOLN Place 1-2 drops into both eyes 4 (four) times daily as needed for eye irritation.   Omega-3 Fatty Acids (FISH OIL) 1000 MG CAPS Take 1,000 mg by mouth 2 (two) times daily.   OXYGEN Inhale 2 L/min into the lungs as needed (for shortness of breath).   pantoprazole (PROTONIX) 40 MG tablet Take 1 tablet (40 mg total) by mouth 2 (two) times daily.   vitamin C (ASCORBIC ACID) 500 MG tablet Take 500 mg by mouth daily.   vitamin E 400 UNIT capsule Take 400 Units by mouth daily.     Allergies:   Ace inhibitors   Social History   Socioeconomic History   Marital status: Married    Spouse name: Not on file   Number of children: Not on file   Years of education: Not on file   Highest education  level: Not on file  Occupational History   Not on file  Tobacco Use   Smoking status: Former    Packs/day: 2.50    Years: 40.00    Total pack years: 100.00    Types: Cigarettes    Quit date: 09/01/1999    Years since quitting: 22.5   Smokeless tobacco: Never  Vaping Use   Vaping Use: Never used  Substance and Sexual Activity   Alcohol use: No   Drug use: No   Sexual activity: Not on file  Other Topics Concern   Not on file  Social History Narrative   Lives with wife.   Retired.   Telephone business prior.   Social Determinants of Health  Financial Resource Strain: Not on file  Food Insecurity: No Food Insecurity (08/07/2021)   Hunger Vital Sign    Worried About Running Out of Food in the Last Year: Never true    Ran Out of Food in the Last Year: Never true  Transportation Needs: No Transportation Needs (08/12/2021)   PRAPARE - Hydrologist (Medical): No    Lack of Transportation (Non-Medical): No  Physical Activity: Not on file  Stress: Not on file  Social Connections: Not on file     Family History: The patient's family history includes Asthma in his sister; Cancer - Colon in his mother; Diabetes in his mother; Emphysema in his sister; Heart disease in his sister; Heart failure in his father; Hypertension in his mother; Kidney failure in his father.  ROS:   Please see the history of present illness.    All other systems reviewed and are negative.  EKGs/Labs/Other Studies Reviewed:    The following studies were reviewed today: I discussed my findings with the patient at length..  EKG reveals sinus rhythm right bundle branch block and nonspecific ST-T changes and septal infarction of undetermined age.   Recent Labs: 08/02/2021: Magnesium 2.0 09/26/2021: B Natriuretic Peptide 118.8 09/27/2021: ALT 37 09/29/2021: BUN 58; Creatinine, Ser 3.28; Hemoglobin 8.3; Platelets 162; Potassium 4.4; Sodium 138  Recent Lipid Panel    Component Value  Date/Time   TRIG 150 (H) 07/30/2021 0867    Physical Exam:    VS:  BP 124/61   Pulse 74   Ht '5\' 9"'$  (1.753 m)   Wt 156 lb 12.8 oz (71.1 kg)   BMI 23.16 kg/m     Wt Readings from Last 3 Encounters:  03/19/22 156 lb 12.8 oz (71.1 kg)  09/28/21 163 lb 5.8 oz (74.1 kg)  08/03/21 162 lb 7.7 oz (73.7 kg)     GEN: Patient is in no acute distress HEENT: Normal NECK: No JVD; No carotid bruits LYMPHATICS: No lymphadenopathy CARDIAC: Hear sounds regular, 2/6 systolic murmur at the apex. RESPIRATORY:  Clear to auscultation without rales, wheezing or rhonchi  ABDOMEN: Soft, non-tender, non-distended MUSCULOSKELETAL:  No edema; No deformity  SKIN: Warm and dry NEUROLOGIC:  Alert and oriented x 3 PSYCHIATRIC:  Normal affect   Signed, Jenean Lindau, MD  03/19/2022 11:11 AM    [place]

## 2022-03-19 NOTE — Telephone Encounter (Signed)
Patient's son is requesting a call back for an overview of today's office visit with Dr. Geraldo Pitter.

## 2022-03-23 NOTE — Telephone Encounter (Signed)
[facility] is calling back to give and get update on this surgery clearance.

## 2022-03-23 NOTE — Telephone Encounter (Signed)
   Patient Name: Harry Norris  DOB: Dec 13, 1935 MRN: [id number]  Primary Cardiologist: None  Chart reviewed as part of pre-operative protocol coverage. Given past medical history and time since last visit, based on ACC/AHA guidelines, Homero R Linsley would be at acceptable risk for the planned procedure without further cardiovascular testing.   I will route this recommendation to the requesting party via Epic fax function and remove from pre-op pool.  Please call with questions.  Lenna Sciara, NP 03/23/2022, 1:13 PM

## 2022-03-23 NOTE — Telephone Encounter (Signed)
I have asked pre op provider to please review of the pt has been cleared. Pt was recently seen by Dr. Geraldo Pitter 03/19/22.

## 2022-03-26 ENCOUNTER — Other Ambulatory Visit: Payer: Self-pay

## 2022-03-26 ENCOUNTER — Encounter (HOSPITAL_COMMUNITY): Payer: Self-pay | Admitting: Optometry

## 2022-03-26 NOTE — Progress Notes (Addendum)
Anesthesia Chart Review: SAME DAY WORK-UP  Case: [id number] Date/Time: 03/27/22 1533   Procedures:      REPAIR OF ECTROPION (Bilateral)     CONJUNCTIVOPLASTY (Bilateral)   Anesthesia type: Monitor Anesthesia Care   Pre-op diagnosis: senile ectropion right and left lower eyelid   Location: [facility] OR ROOM 08 / [place] [facility]   Surgeons: Delia Chimes, MD       DISCUSSION: Patient is an 86 year old male scheduled for the above procedure.  History includes former smoker (quit 09/01/99), COPD (severe), HTN, cardiomyopathy (> 5 years, "nonischemic cardiomyopathy" per 06/20/15 cardiology note by Dr. Bettina Gavia), left BBB (> 5 years), bradycardia, DM2, anemia, AAA (3.5 cm 09/26/21 CT), carotid artery disease (mild by 2009 [place]), HLD, CKD, venous insufficiency with varicose veins (s/p right GSV laser ablation 03/29/14), syncope (in setting of orthostatic hypotension 2017), prostate cancer (s/p gold seed implant 09/04/09).   - [place] admission 09/26/21-09/29/21 for weakness with recurrent falls, dehydration with AKI (Creatinine 5.89, previously ~ 0.9 - 1.3  07/2021-07/2021) in setting of diarrhea. + FOBT with HGB 8.0 (down from 9.2 09/26/21). Imaging also concerning for acute sphenoid sinusitis and possible left lung base infection, descending colon diverticulitis, bilateral renal lesions of varying complexity concerning for hemorrhagic cyst or solid neoplasms, and a 6 mm pulmonary nodule in LLL. He received IV antibiotics, IVF hydration. Nephrology consulted and losartan discontinued. GI consulted, but patient declined EGD and was treated with PPI and HGB remained stable. Repeat imaging in 6 months for renal lesions, and 6-12 months for lung lesion.   Dr. Hollice Espy obtained preoperative medical and cardiology input. - PCP [name], MD signed a form on 03/17/22 indicating patient could hold ASA for 7 days prior to surgery and resume 3 days after surgery and classified patient as "acceptable risk".   - Preoperative  cardiology input outline by Diona Browner, NP on 03/23/22. (Had office visit with Dr. Geraldo Pitter on 03/19/22.). She wrote, "Chart reviewed as part of pre-operative protocol coverage. Given past medical history and time since last visit, based on ACC/AHA guidelines, Harry Norris would be at acceptable risk for the planned procedure without further cardiovascular testing."  He is a same day work-up, so anesthesia team to evaluate on the day of surgery. I did request last labs and office note from [place], otherwise labs as indicated on arrival.  UPDATE 03/26/22 3:42 PM: PCP records received. Last office visit 02/17/22. A1c 6.3%, glucose 119, BUN 37, Cr 1.86, eGFR 35, Na 140, K 4.3, Ca 10.0, Albumin 4.4, total bili 0.4, alk phos 44, AST 19, ALT 11, WBC 6.0, H/H 11.8/35.3, PLT 343, TIBC 330, iron 69, iron saturation 21%. He had declined GI work-up, but did received IV iron x 2 and ASA held.    VS:  BP Readings from Last 3 Encounters:  03/19/22 124/61  09/29/21 (!) 127/43  08/03/21 (!) 143/64   Pulse Readings from Last 3 Encounters:  03/19/22 74  09/29/21 79  08/03/21 88     PROVIDERS: [name], MD is PCP   Shirlee More, MD has been cardiologist (last visit 11/29/19), although most recent visit was on 03/19/22 with Revankar, Sunny Schlein, MD. 12/12/18 notes by Dr. Bettina Gavia indicate that previous EP evaluation following syncope and PPM or ICD was not recommended at that time as syncope felt due to orthostatic hypotension. At 03/19/22 visit, he denied any significant HF symptoms. 3 month follow-up with Dr. Bettina Gavia planned.  Baltazar Apo, MD is pulmonologist. Last office visit  03/22/19.   Jamelle Haring, MD is vascular surgeon. Last visit 07/01/21 with one year follow-up recommended.   LABS: For day of surgery as indicated. As of 09/29/21, Cr 3.23, down from 3.82-5.04. H/H were 8.3/24.9--those labs done during acute admission. Last labs requested from PCP office.  See DISCUSSION.   - Received additional records from PCP. 02/17/22 labs included: A1c 6.3%, glucose 119, BUN 37, Cr 1.86, eGFR 35, Na 140, K 4.3, Ca 10.0, Albumin 4.4, total bili 0.4, alk phos 44, AST 19, ALT 11, WBC 6.0, H/H 11.8/35.3, PLT 343, TIBC 330, iron 69, iron saturation 21%.    PFTs 01/18/14: FVC 1.75 (40%) FEV1 1.39 (44%), post bronchodilator FEV1 1.58 [50%], +13% F/F 80 Severe obstructive airway disease.   IMAGES: CT Abd/pelvis w/o contrast 09/26/21: IMPRESSION: 1. No posttraumatic deformity identified. 2. New areas of bibasilar airspace disease since 06/06/2021. Concurrent cylindrical bronchiectasis. Most consistent with infection or aspiration of indeterminate acuity. 3. Non complicated descending diverticulitis. 4. Nonobstructive small bowel extends minimally into an area of ventral pelvic wall laxity. 5. Bilateral renal lesions of varying complexity, incompletely characterized on this noncontrast exam. These could represent hemorrhagic cysts or solid neoplasms. Consider further evaluation with outpatient pre and post contrast abdominal MRI. If this 87 year old patient is not a good MRI candidate, renal protocol CT at 6 months suggested. 6.  Tiny hiatal hernia. 7. Aortic atherosclerosis (ICD10-I70.0), coronary artery atherosclerosis and emphysema (ICD10-J43.9). 8. 6 mm left lower lobe pulmonary nodule which is likely new since the prior. Non-contrast chest CT at 6-12 months is recommended. If the nodule is stable at time of repeat CT, then future CT at 18-24 months (from today's scan) is considered optional for low-risk patients, but is recommended for high-risk patients. This recommendation follows the consensus statement: Guidelines for Management of Incidental Pulmonary Nodules Detected on CT Images: From the Fleischner Society 2017; Radiology 2017; 284:228-243. - Vascular/Lymphatic: Advanced aortic and branch vessel atherosclerosis. Infrarenal abdominal aortic aneurysm of 3.5 cm  is similar to on the prior exam. No surrounding hemorrhage. A retroaortic left renal vein. No abdominopelvic adenopathy.   CT C-spine 09/26/21: IMPRESSION: 1. No acute fracture or traumatic malalignment in the cervical spine. 2. Increased basilar and dens interval, just above normal with associated degenerative changes at C1-2 and at the tip of the dens favored to represent sequela of remote trauma and or degenerative process. In the absence of priors for comparison, if there is high clinical suspicion for ligamentous injury MRI could be helpful for further assessment. 3. Multilevel degenerative changes with disc space narrowing and facet arthropathy likely explains mild anterolisthesis of C3 on C4 and C4 on C5.  CT Head 09/26/21:  IMPRESSION: No acute intracranial findings are seen.  Atrophy. Chronic ethmoid and maxillary sinusitis. Small air-fluid level is seen in the sphenoid sinus suggesting possible acute sinusitis. There is fluid density in mastoid air cells on both sides suggesting mastoid effusions or chronic mastoiditis.  CT Maxillofacial 09/26/21: IMPRESSION: 1. No signs of acute facial bone fracture. 2. Sinus disease and chronic mastoid effusions.   EKG: EKG 03/19/22 ([facility]): Normal sinus rhythm Right bundle branch block Septal infarct, age undetermined T wave abnormality, consider inferior ischemia - LAD present. History of LBBB/non-specific intra-ventricular conduction block   CV: Echo 07/30/21: IMPRESSIONS   1. Left ventricular ejection fraction, by estimation, is 25 to 30%. Left  ventricular ejection fraction by 3D volume is 26 %. The left ventricle has  severely decreased function. The left ventricle demonstrates regional wall  motion  abnormalities (see  scoring diagram/findings for description). Left ventricular diastolic  parameters are consistent with Grade III diastolic dysfunction  (restrictive).   2. Right ventricular systolic function is  normal. The right ventricular  size is normal. Tricuspid regurgitation signal is inadequate for assessing  PA pressure.   3. The pericardial effusion is circumferential.   4. The mitral valve is normal in structure. Trivial mitral valve  regurgitation. No evidence of mitral stenosis.   5. The aortic valve is normal in structure. Aortic valve regurgitation is  not visualized. Aortic valve sclerosis is present, with no evidence of  aortic valve stenosis.   6. The inferior vena cava is dilated in size with >50% respiratory  variability, suggesting right atrial pressure of 8 mmHg.  - Comparison 03/02/18: LVEF 35%, diffuse LV hypokinesis, findings indicate significant septal-lateral left ventricular wall dyssynchrony   Long term monitor ZioXT 12/17/18-12/23/18: - The rhythm throughout is sinus with minimum average and maximum heart rates of 49, 75 and 92 bpm. - There were no pauses of 3 seconds or greater no sustained bradycardia and no episodes of second or third-degree AV block or sinus node exit block. - Supraventricular arrhythmia was rare there were no episodes of atrial fibrillation or flutter. - Ventricular arrhythmia is frequent burden of 14.9% with episodes of bigeminy and trigeminy but no couplets triplets or episodes of ventricular tachycardia. - There were no triggered or diary events. - Conclusion, frequent ventricular ectopy with bigeminy and trigeminy.    MUGA Scan 01/28/17 ([facility]PACS): IMPRESSION: Low normal ejection fraction at 48%. No specific wall motion abnormality is noted.    Nuclear stress test 12/28/09: Report not viewable in [facility]ACS. Reportedly he had a cardiac cath in 2011. Dr. Joya Gaskins notes, classify patient's cardiomyopathy as "nonischemic".   US Carotid 10/06/07 ([facility]): IMPRESSION: Mild appreciable plaque noted in the right to and moderate appreciable plaque noted in the left carotid vasculature. No hemodynamically significant stenosis identified.     Past Medical History:  Diagnosis Date   AAA (abdominal aortic aneurysm) ([place])    Acute hypoxemic respiratory failure ([place]) 07/12/2021   Anemia    Asthma 02/07/2018   Benign neoplasm of colon    Blood in the stool    Bradycardia 12/19/2018   Bronchitis    Cancer ([place])    prostate   Cardiomyopathy ([place]) 12/18/2015   Carotid artery occlusion    Chronic obstructive pulmonary disease with acute exacerbation ([place]) 07/12/2021   Chronic rhinitis 02/26/2016   Chronic venous insufficiency 12/18/2015   CKD (chronic kidney disease), stage III ([place]) 07/12/2021   Community acquired pneumonia of right lower lobe of lung 07/12/2021   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   PFTs 01/18/14:  FeV1 1.58 45% FVC 2.0 50% FeV1/FVC 79% Fef 25-75 795 ( 40% improved after BD) CXR: Copd changes ONO 03/07/14 desats to <88% on RA    Diarrhea    Diverticulitis    DM type 2 (diabetes mellitus, type 2) ([place])    patient denies every being diagnosed with diabetes   Fatigue 12/12/2018   Hyperlipidemia    Hypertension    Hypertensive heart disease    Hypoxemia 04/10/2014   Overview:  Last Assessment & Plan:  Cont nocturnal oxygen therapy 2L    Influenza 07/29/2021   Kidney stone    LBBB (left bundle branch block) 12/18/2015   Leg swelling 11/28/2013   Orthostatic hypotension 12/19/2018   Other symptoms involving cardiovascular system 11/28/2013   Pressure injury of skin 07/30/2021  Prostate cancer [facility])    prostate    Renal insufficiency 09/26/2021   Respiratory failure ([place]) 07/29/2021   Sepsis ([place]) 07/12/2021   Syncope 12/18/2015   Varicose veins of lower extremities with other complications 97/28/2060    Past Surgical History:  Procedure Laterality Date    cardiac catheterization  12/29/2009   CATARACT EXTRACTION Bilateral    ENDOVENOUS ABLATION SAPHENOUS VEIN W/ LASER Right 03/29/2014   EVLA right greater saphenous vein     gold seed implants  08/31/2009   treatment of prostate cancer    HERNIA REPAIR Bilateral    LITHOTRIPSY  08/31/2008   PROSTATE SURGERY     pt reports he "think they removed" his prostate   right ear surgery      MEDICATIONS: No current facility-administered medications for this encounter.    albuterol (VENTOLIN HFA) 108 (90 Base) MCG/ACT inhaler   aspirin EC 81 MG tablet   atorvastatin (LIPITOR) 40 MG tablet   fenofibrate 160 MG tablet   FLOVENT HFA 110 MCG/ACT inhaler   formoterol (PERFOROMIST) 20 MCG/2ML nebulizer solution   furosemide (LASIX) 40 MG tablet   hydrOXYzine (ATARAX) 25 MG tablet   melatonin 5 MG TABS   Multiple Vitamins-Minerals (CENTRUM SILVER 50+MEN) TABS   Omega-3 Fatty Acids (FISH OIL) 1000 MG CAPS   OXYGEN   pantoprazole (PROTONIX) 40 MG tablet   vitamin C (ASCORBIC ACID) 500 MG tablet   vitamin E 400 UNIT capsule   losartan (COZAAR) 100 MG tablet   naphazoline-glycerin (CLEAR EYES REDNESS) 0.012-0.2 % SOLN    Myra Gianotti, PA-C Surgical Short Stay/Anesthesiology [facility] Phone [phone] [facility] Phone [phone] 03/26/2022 11:57 AM

## 2022-03-26 NOTE — Anesthesia Preprocedure Evaluation (Addendum)
Anesthesia Evaluation  Patient identified by MRN, date of birth, ID band Patient awake    Reviewed: Allergy & Precautions, NPO status , Patient's Chart, lab work & pertinent test results  Airway Mallampati: II  TM Distance: >3 FB Neck ROM: Full    Dental   2 teeth only, none loose:   Pulmonary asthma , COPD (2L at night),  oxygen dependent, former smoker,  Chronic bronchitis Chronic cough   + rhonchi        Cardiovascular Exercise Tolerance: Poor hypertension, Normal cardiovascular exam+ dysrhythmias (LBBB)  Rhythm:Regular Rate:Normal  EKG: EKG 03/19/22 ([facility]): Normal sinus rhythm Right bundle branch block Septal infarct, age undetermined T wave abnormality, consider inferior ischemia - LAD present. History of LBBB/non-specific intra-ventricular conduction block   CV: Echo 07/30/21: IMPRESSIONS  1. Left ventricular ejection fraction, by estimation, is 25 to 30%. Left  ventricular ejection fraction by 3D volume is 26 %. The left ventricle has  severely decreased function. The left ventricle demonstrates regional wall  motion abnormalities (see  scoring diagram/findings for description). Left ventricular diastolic  parameters are consistent with Grade III diastolic dysfunction  (restrictive).  2. Right ventricular systolic function is normal. The right ventricular  size is normal. Tricuspid regurgitation signal is inadequate for assessing  PA pressure.  3. The pericardial effusion is circumferential.  4. The mitral valve is normal in structure. Trivial mitral valve  regurgitation. No evidence of mitral stenosis.  5. The aortic valve is normal in structure. Aortic valve regurgitation is  not visualized. Aortic valve sclerosis is present, with no evidence of  aortic valve stenosis.  6. The inferior vena cava is dilated in size with >50% respiratory  variability, suggesting right atrial pressure of 8 mmHg.   - Comparison 03/02/18: LVEF 35%, diffuse LV hypokinesis, findings indicate significant septal-lateral left ventricular wall dyssynchrony   Neuro/Psych negative neurological ROS  negative psych ROS   GI/Hepatic   Endo/Other  diabetes, Type 2hyperlipidemia  Renal/GU Renal Insufficiency and CRFRenal disease  negative genitourinary   Musculoskeletal negative musculoskeletal ROS (+)   Abdominal   Peds negative pediatric ROS (+)  Hematology  (+) Blood dyscrasia, anemia ,   Anesthesia Other Findings Prostate cancer  Reproductive/Obstetrics negative OB ROS                            Anesthesia Physical Anesthesia Plan  ASA: 4  Anesthesia Plan: MAC   Post-op Pain Management: Minimal or no pain anticipated   Induction: Intravenous  PONV Risk Score and Plan: 1 and Propofol infusion, TIVA and Treatment may vary due to age or medical condition  Airway Management Planned: Natural Airway  Additional Equipment: None  Intra-op Plan:   Post-operative Plan:   Informed Consent: I have reviewed the patients History and Physical, chart, labs and discussed the procedure including the risks, benefits and alternatives for the proposed anesthesia with the patient or authorized representative who has indicated his/her understanding and acceptance.     Dental advisory given and Consent reviewed with POA  Plan Discussed with: Anesthesiologist and CRNA  Anesthesia Plan Comments: (PAT note written 03/26/2022 by Myra Gianotti, PA-C. )       Anesthesia Quick Evaluation

## 2022-03-26 NOTE — Pre-Procedure Instructions (Addendum)
SDW CALL  Patient was given pre-op instructions over the phone. The opportunity was given for the patient to ask questions. No further questions asked. Patient verbalized understanding of instructions given.   PCP - [name], MD Cardiologist - Dr Geraldo Pitter- clearance note in epic Pt denies having pulmonologist, and states he is unsure if he has a nephrologist.   Home O2 listed in med list- pt states he has not used oxygen in 6 months and uses it "as needed". Pt states he does not think that he has a portable oxygen tank.   PPM/ICD - denies Chest x-ray - 09/26/21 EKG - 03/19/22 Stress Test -  ECHO - 07/30/22 Cardiac Cath - listed in medical history for 2011- pt states he does not remember  Sleep Study - denies Fasting Blood Sugar - patient denies diabetes and states he does not check his blood sugar, pt states he has never been diagnosed with diabetes   Aspirin Instructions: Pt states he was given no instructions on taking ASA  ERAS Protcol - ERAS order per protocol  COVID TEST- N/A   Anesthesia review: yes  Patient denies shortness of breath, fever, cough and chest pain over the phone call    Surgical Instructions    Your procedure is scheduled on Friday, July 28  Report to [facility] Main Entrance "A" at 1:15 PM., then check in with the Admitting office.  Call this number if you have problems the morning of surgery:  [phone]    Remember:  Do not eat after midnight the night before your surgery  You may drink clear liquids until 12:45PM the day of your surgery.   Clear liquids allowed are: Water, Non-Citrus Juices (without pulp), Carbonated Beverages, Clear Tea, Black Coffee ONLY (NO MILK, CREAM OR POWDERED CREAMER of any kind), and Gatorade   Take these medicines the morning of surgery with A SIP OF WATER:  formoterol (PERFOROMIST) 20 MCG/2ML nebulizer solution pantoprazole (PROTONIX) albuterol (VENTOLIN HFA) 108 (90 Base) MCG/ACT inhaler if  needed Flovent inhaler if needed  Please bring all inhalers with you the day of surgery.   Follow your surgeon's instructions on when to stop Aspirin.  If no instructions were given by your surgeon then you will need to call the office to get those instructions.    As of today, STOP taking any  Aleve, Naproxen, Ibuprofen, Motrin, Advil, Goody's, BC's, all herbal medications, fish oil, and all vitamins.   [facility] is not responsible for any belongings or valuables.   Contacts, glasses, hearing aids, dentures or partials may not be worn into surgery, please bring cases for these belongings   Patients discharged the day of surgery will not be allowed to drive home, and someone needs to stay with them for 24 hours.   SURGICAL WAITING ROOM VISITATION 1 visitor is allowed in pre-op area with patient.  Patients having surgery or a procedure in a hospital may have two support people in the waiting room. Children under the age of 46 must have an adult with them who is not the patient. They may stay in the waiting area during the procedure and may switch out with other visitors. If the patient needs to stay at the hospital during part of their recovery, the visitor guidelines for inpatient rooms apply.  Please refer to the [facility] website for the visitor guidelines for Inpatients (after your surgery is over and you are in a regular room).     Special instructions:  Day of Surgery:  Take a shower the day of or night before with antibacterial soap. Wear Clean/Comfortable clothing the morning of surgery Do not apply any deodorants/lotions.   Do not wear jewelry or makeup Do not wear lotions, powders, perfumes/colognes, or deodorant. Do not shave 48 hours prior to surgery.  Men may shave face and neck. Do not bring valuables to the hospital. Do not wear nail polish, gel polish, artificial nails, or any other type of covering on natural nails (fingers and toes) If you have  artificial nails or gel coating that need to be removed by a nail salon, please have this removed prior to surgery. Artificial nails or gel coating may interfere with anesthesia's ability to adequately monitor your vital signs. Remember to brush your teeth WITH YOUR REGULAR TOOTHPASTE.

## 2022-03-27 ENCOUNTER — Other Ambulatory Visit: Payer: Self-pay

## 2022-03-27 ENCOUNTER — Ambulatory Visit (HOSPITAL_BASED_OUTPATIENT_CLINIC_OR_DEPARTMENT_OTHER): Payer: Medicare Other | Admitting: Vascular Surgery

## 2022-03-27 ENCOUNTER — Encounter (HOSPITAL_COMMUNITY): Payer: Self-pay | Admitting: Optometry

## 2022-03-27 ENCOUNTER — Ambulatory Visit (HOSPITAL_COMMUNITY)
Admission: RE | Admit: 2022-03-27 | Discharge: 2022-03-27 | Disposition: A | Discharge status: Home / Self Care | Payer: Medicare Other | Attending: Optometry | Admitting: Optometry

## 2022-03-27 ENCOUNTER — Ambulatory Visit (HOSPITAL_COMMUNITY): Payer: Medicare Other | Admitting: Vascular Surgery

## 2022-03-27 ENCOUNTER — Encounter (HOSPITAL_COMMUNITY)
Admission: RE | Disposition: A | Discharge status: Home / Self Care | Payer: Self-pay | Source: Home / Self Care | Attending: Optometry

## 2022-03-27 DIAGNOSIS — Z9981 Dependence on supplemental oxygen: Secondary | ICD-10-CM | POA: Diagnosis not present

## 2022-03-27 DIAGNOSIS — H04523 Eversion of bilateral lacrimal punctum: Secondary | ICD-10-CM | POA: Diagnosis not present

## 2022-03-27 DIAGNOSIS — I459 Conduction disorder, unspecified: Secondary | ICD-10-CM | POA: Diagnosis not present

## 2022-03-27 DIAGNOSIS — N183 Chronic kidney disease, stage 3 unspecified: Secondary | ICD-10-CM | POA: Insufficient documentation

## 2022-03-27 DIAGNOSIS — E1122 Type 2 diabetes mellitus with diabetic chronic kidney disease: Secondary | ICD-10-CM | POA: Insufficient documentation

## 2022-03-27 DIAGNOSIS — H02102 Unspecified ectropion of right lower eyelid: Secondary | ICD-10-CM | POA: Diagnosis present

## 2022-03-27 DIAGNOSIS — J439 Emphysema, unspecified: Secondary | ICD-10-CM | POA: Insufficient documentation

## 2022-03-27 DIAGNOSIS — I452 Bifascicular block: Secondary | ICD-10-CM | POA: Insufficient documentation

## 2022-03-27 DIAGNOSIS — D631 Anemia in chronic kidney disease: Secondary | ICD-10-CM | POA: Insufficient documentation

## 2022-03-27 DIAGNOSIS — H02135 Senile ectropion of left lower eyelid: Secondary | ICD-10-CM | POA: Insufficient documentation

## 2022-03-27 DIAGNOSIS — H02132 Senile ectropion of right lower eyelid: Secondary | ICD-10-CM

## 2022-03-27 DIAGNOSIS — I7143 Infrarenal abdominal aortic aneurysm, without rupture: Secondary | ICD-10-CM | POA: Diagnosis not present

## 2022-03-27 DIAGNOSIS — R053 Chronic cough: Secondary | ICD-10-CM | POA: Diagnosis not present

## 2022-03-27 DIAGNOSIS — J449 Chronic obstructive pulmonary disease, unspecified: Secondary | ICD-10-CM

## 2022-03-27 DIAGNOSIS — I129 Hypertensive chronic kidney disease with stage 1 through stage 4 chronic kidney disease, or unspecified chronic kidney disease: Secondary | ICD-10-CM | POA: Diagnosis not present

## 2022-03-27 DIAGNOSIS — I358 Other nonrheumatic aortic valve disorders: Secondary | ICD-10-CM | POA: Insufficient documentation

## 2022-03-27 DIAGNOSIS — I429 Cardiomyopathy, unspecified: Secondary | ICD-10-CM | POA: Diagnosis not present

## 2022-03-27 DIAGNOSIS — Z8546 Personal history of malignant neoplasm of prostate: Secondary | ICD-10-CM | POA: Diagnosis not present

## 2022-03-27 DIAGNOSIS — E785 Hyperlipidemia, unspecified: Secondary | ICD-10-CM | POA: Insufficient documentation

## 2022-03-27 DIAGNOSIS — Z87891 Personal history of nicotine dependence: Secondary | ICD-10-CM | POA: Insufficient documentation

## 2022-03-27 DIAGNOSIS — I700 Atherosclerosis of aorta: Secondary | ICD-10-CM | POA: Insufficient documentation

## 2022-03-27 HISTORY — PX: ECTROPION REPAIR: SHX357

## 2022-03-27 LAB — BASIC METABOLIC PANEL
Anion gap: 8 (ref 5–15)
BUN: 27 mg/dL — ABNORMAL HIGH (ref 8–23)
CO2: 30 mmol/L (ref 22–32)
Calcium: 9.4 mg/dL (ref 8.9–10.3)
Chloride: 102 mmol/L (ref 98–111)
Creatinine, Ser: 1.79 mg/dL — ABNORMAL HIGH (ref 0.61–1.24)
GFR, Estimated: 37 mL/min — ABNORMAL LOW (ref >60)
Glucose, Bld: 87 mg/dL (ref 70–99)
Potassium: 3.9 mmol/L (ref 3.5–5.1)
Sodium: 140 mmol/L (ref 135–145)

## 2022-03-27 LAB — CBC
HCT: 35.6 % — ABNORMAL LOW (ref 39.0–52.0)
Hemoglobin: 11.2 g/dL — ABNORMAL LOW (ref 13.0–17.0)
MCH: 29.8 pg (ref 26.0–34.0)
MCHC: 31.5 g/dL (ref 30.0–36.0)
MCV: 94.7 fL (ref 80.0–100.0)
Platelets: 286 10*3/uL (ref 150–400)
RBC: 3.76 MIL/uL — ABNORMAL LOW (ref 4.22–5.81)
RDW: 15.3 % (ref 11.5–15.5)
WBC: 7.5 10*3/uL (ref 4.0–10.5)
nRBC: 0 % (ref 0.0–0.2)

## 2022-03-27 SURGERY — REPAIR, ECTROPION, EYELID
Anesthesia: Monitor Anesthesia Care | Site: Eye | Laterality: Bilateral

## 2022-03-27 MED ORDER — BUPIVACAINE HCL (PF) 0.75 % IJ SOLN
INTRAMUSCULAR | Status: DC | PRN
  Administered 2022-03-27: 3 mL

## 2022-03-27 MED ORDER — OXYCODONE HCL 5 MG/5ML PO SOLN: 5.0000 mg | Freq: Once | ORAL | Status: DC | PRN

## 2022-03-27 MED ORDER — BUPIVACAINE HCL (PF) 0.75 % IJ SOLN
INTRAMUSCULAR | Status: AC
  Filled 2022-03-27: qty 10

## 2022-03-27 MED ORDER — DEXMEDETOMIDINE (PRECEDEX) IN NS 20 MCG/5ML (4 MCG/ML) IV SYRINGE
PREFILLED_SYRINGE | INTRAVENOUS | Status: DC | PRN
  Administered 2022-03-27: 8 ug via INTRAVENOUS

## 2022-03-27 MED ORDER — ORAL CARE MOUTH RINSE: 15.0000 mL | Freq: Once | OROMUCOSAL | Status: AC

## 2022-03-27 MED ORDER — ONDANSETRON HCL 4 MG/2ML IJ SOLN: 4.0000 mg | Freq: Once | INTRAMUSCULAR | Status: DC | PRN

## 2022-03-27 MED ORDER — EPHEDRINE SULFATE-NACL 50-0.9 MG/10ML-% IV SOSY
PREFILLED_SYRINGE | INTRAVENOUS | Status: DC | PRN
  Administered 2022-03-27: 5 mg via INTRAVENOUS

## 2022-03-27 MED ORDER — SODIUM CHLORIDE 0.9 % IV SOLN: INTRAVENOUS | Status: DC

## 2022-03-27 MED ORDER — CHLORHEXIDINE GLUCONATE 0.12 % MT SOLN: 15.0000 mL | Freq: Once | OROMUCOSAL | Status: AC

## 2022-03-27 MED ORDER — AMISULPRIDE (ANTIEMETIC) 5 MG/2ML IV SOLN: 10.0000 mg | Freq: Once | INTRAVENOUS | Status: DC | PRN

## 2022-03-27 MED ORDER — CHLORHEXIDINE GLUCONATE 0.12 % MT SOLN
OROMUCOSAL | Status: AC
  Administered 2022-03-27: 15 mL via OROMUCOSAL
  Filled 2022-03-27: qty 15

## 2022-03-27 MED ORDER — LIDOCAINE-EPINEPHRINE 1 %-1:100000 IJ SOLN
INTRAMUSCULAR | Status: DC | PRN
  Administered 2022-03-27: 3 mL

## 2022-03-27 MED ORDER — ACETAMINOPHEN 10 MG/ML IV SOLN: 1000.0000 mg | Freq: Once | INTRAVENOUS | Status: DC | PRN

## 2022-03-27 MED ORDER — OXYCODONE HCL 5 MG PO TABS: 5.0000 mg | ORAL_TABLET | Freq: Once | ORAL | Status: DC | PRN

## 2022-03-27 MED ORDER — PROPOFOL 10 MG/ML IV BOLUS
INTRAVENOUS | Status: DC | PRN
  Administered 2022-03-27 (×2): 10 mg via INTRAVENOUS

## 2022-03-27 MED ORDER — TETRACAINE HCL 0.5 % OP SOLN
OPHTHALMIC | Status: AC
Start: 2022-03-27 — End: ?
  Filled 2022-03-27: qty 4

## 2022-03-27 MED ORDER — PROPOFOL 500 MG/50ML IV EMUL
INTRAVENOUS | Status: DC | PRN
  Administered 2022-03-27: 50 ug/kg/min via INTRAVENOUS

## 2022-03-27 MED ORDER — BSS IO SOLN
INTRAOCULAR | Status: AC
  Filled 2022-03-27: qty 15

## 2022-03-27 MED ORDER — TETRACAINE HCL 0.5 % OP SOLN
OPHTHALMIC | Status: DC | PRN
  Administered 2022-03-27: 2 [drp] via OPHTHALMIC

## 2022-03-27 MED ORDER — ERYTHROMYCIN 5 MG/GM OP OINT
TOPICAL_OINTMENT | OPHTHALMIC | Status: AC
Start: 2022-03-27 — End: ?
  Filled 2022-03-27: qty 3.5

## 2022-03-27 MED ORDER — LIDOCAINE-EPINEPHRINE 1 %-1:100000 IJ SOLN
INTRAMUSCULAR | Status: AC
  Filled 2022-03-27: qty 1

## 2022-03-27 MED ORDER — ERYTHROMYCIN 5 MG/GM OP OINT: TOPICAL_OINTMENT | OPHTHALMIC | 3 refills | Status: DC

## 2022-03-27 MED ORDER — SODIUM CHLORIDE 0.9% FLUSH
3.0000 mL | Freq: Two times a day (BID) | INTRAVENOUS | Status: DC
Start: 2022-03-27 — End: 2022-03-27

## 2022-03-27 MED ORDER — FENTANYL CITRATE (PF) 250 MCG/5ML IJ SOLN
INTRAMUSCULAR | Status: AC
  Filled 2022-03-27: qty 5

## 2022-03-27 MED ORDER — FENTANYL CITRATE (PF) 250 MCG/5ML IJ SOLN
INTRAMUSCULAR | Status: DC | PRN
  Administered 2022-03-27 (×2): 25 ug via INTRAVENOUS

## 2022-03-27 MED ORDER — FENTANYL CITRATE (PF) 100 MCG/2ML IJ SOLN: 25.0000 ug | INTRAMUSCULAR | Status: DC | PRN

## 2022-03-27 SURGICAL SUPPLY — 45 items
APPLICATOR COTTON TIP 6 STRL (MISCELLANEOUS) ×1 IMPLANT
APPLICATOR COTTON TIP 6IN STRL (MISCELLANEOUS) ×2 IMPLANT
APPLICATOR DR MATTHEWS STRL (MISCELLANEOUS) ×5 IMPLANT
BLADE SURG 15 STRL LF DISP TIS (BLADE) ×1 IMPLANT
BLADE SURG 15 STRL SS (BLADE) ×1
BNDG COHESIVE 2X5 WHT NS (GAUZE/BANDAGES/DRESSINGS) ×1 IMPLANT
BNDG EYE OVAL (GAUZE/BANDAGES/DRESSINGS) IMPLANT
CLSR STERI-STRIP ANTIMIC 1/2X4 (GAUZE/BANDAGES/DRESSINGS) ×1 IMPLANT
CORD BIPOLAR FORCEPS 12FT (ELECTRODE) ×2 IMPLANT
COVER SURGICAL LIGHT HANDLE (MISCELLANEOUS) ×2 IMPLANT
DRAPE ORTHO SPLIT 77X108 STRL (DRAPES) ×1
DRAPE SURG ORHT 6 SPLT 77X108 (DRAPES) ×1 IMPLANT
DRSG TELFA 3X8 NADH (GAUZE/BANDAGES/DRESSINGS) IMPLANT
FORCEPS BIPOLAR SPETZLER 8 1.0 (NEUROSURGERY SUPPLIES) ×3 IMPLANT
GLOVE BIO SURGEON STRL SZ7.5 (GLOVE) ×4 IMPLANT
GOWN STRL REUS W/ TWL LRG LVL3 (GOWN DISPOSABLE) ×2 IMPLANT
GOWN STRL REUS W/TWL LRG LVL3 (GOWN DISPOSABLE) ×2
KIT BASIN OR (CUSTOM PROCEDURE TRAY) ×3 IMPLANT
KIT TURNOVER KIT B (KITS) ×2 IMPLANT
NDL HYPO 30X.5 LL (NEEDLE) ×1 IMPLANT
NEEDLE HYPO 30X.5 LL (NEEDLE) ×4 IMPLANT
NS IRRIG 1000ML POUR BTL (IV SOLUTION) ×2 IMPLANT
PAD DRESSING TELFA 3X8 NADH (GAUZE/BANDAGES/DRESSINGS) IMPLANT
PROTECTOR CORNEAL (OPHTHALMIC RELATED) IMPLANT
STRIP CLOSURE SKIN 1/2X4 (GAUZE/BANDAGES/DRESSINGS) ×1 IMPLANT
SUCTION FRAZIER TIP 8 FR DISP (SUCTIONS)
SUCTION TUBE FRAZIER 8FR DISP (SUCTIONS) IMPLANT
SUT CHROMIC 5 0 P 3 (SUTURE) ×1 IMPLANT
SUT CHROMIC 6 0 G 1 (SUTURE) ×2 IMPLANT
SUT CHROMIC 6 0 PS 4 (SUTURE) IMPLANT
SUT ETHILON 6 0 P 1 (SUTURE) IMPLANT
SUT ETHILON 7 0 P 6 18 (SUTURE) ×1 IMPLANT
SUT MERSILENE 5 0 RD 1 DA (SUTURE) ×1 IMPLANT
SUT PLAIN 5 0 P 3 18 (SUTURE) ×1 IMPLANT
SUT PLAIN 6 0 TG1408 (SUTURE) IMPLANT
SUT PROLENE 6 0 C 1 24 (SUTURE) ×1 IMPLANT
SUT SILK 4 0 P 3 (SUTURE) ×1 IMPLANT
SUT VIC AB 4-0 P-3 18X BRD (SUTURE) ×1 IMPLANT
SUT VIC AB 4-0 P2 18 (SUTURE) ×1 IMPLANT
SUT VIC AB 4-0 P3 18 (SUTURE)
SUT VIC AB 5-0 DAS24 8 (SUTURE) ×1 IMPLANT
SUT VICRYL 6-0 S14 (SUTURE) IMPLANT
SUT VICRYL 7 0 TG140 8 (SUTURE) ×1 IMPLANT
SYR CONTROL 10ML LL (SYRINGE) ×3 IMPLANT
TOWEL GREEN STERILE FF (TOWEL DISPOSABLE) ×4 IMPLANT

## 2022-03-27 NOTE — Op Note (Signed)
Operative Note PATIENT NAME:  [facility] NAME: [facility] MRN:  [id number] DATE OF SERVICE:  03/27/2022  DATE OF BIRTH:  17-Jun-1936  PREOPERATIVE DIAGNOSIS:   1. Bilateral lower eyelid ectropion, senile 2. Punctal eversion/ectropion, bilateral lower eyeilds  POSTOPERATIVE DIAGNOSIS:  same  PROCEDURE(S) PERFORMED:  1. Bilateral lower eyelid ectropion repair by lateral tarsal strip 67917 2. Bilateral lower eyelid medial spindle by conjunctivoplasty 48546  SURGEON:  Delia Chimes, MD, PhD ASSISTANT:  none  ANESTHESIA:  MAC  FLUIDS GIVEN: Per Anesthesia   ESTIMATED BLOOD LOSS:  3 cc  TUBES/DRAINS:  None  SPECIMENS/CULTURES: none   IMPLANTS:  none  COMPLICATIONS:  None  INDICATIONS:  The patient has bilateral lower eyelid ectropion of the eyelids causing visual impairment and irritation that interferes with tasks of daily living including reading.  Informed consent had been provided prior to the day of surgery at the patient's clinic visit.  All questions were answered. The patient understands that the goal of surgery is to improve visual function. Prior to entering the operative suite, the general surgical consent was signed. All questions were answered. The patient/advocate understands the risks of surgery include but are not limited to bleeding, infection, scar formation, asymmetry, the need for additional surgical intervention and other less common outcomes noted on the specific eyelid informed consent and anesthesia risks listed on the anesthesia consent. The patient accepts the risks and desires to proceed with surgery   DESCRIPTION OF PROCEDURE: After obtaining informed consent, the patient was taken back to the operating room and laid supine on the operating room table.  Under monitored anesthesia care, the operative site(s) were anesthetized with 2% lidocaine with epinephrine and bupivicaine.  The patient was then prepped and draped in the usual  sterile fashion.        Attention was then directed to the right lower eyelid. An approximately 8 x 3 mm on the right and 15 x 4 on the left ellipse of conjunctiva and part of the lid retractors just lateral to the punctum was incised.  A doubled-armed 6-0 chromic gut suture was used to approximate the conjunctiva wound and lid retractors in horizontal mattress fashion with the suture ends externalized via the fornix to the medial lower eyelid skin.  A 15 blade was used to incise the lateral canthus. Wescott scissors were used to detach the inferior lateral canthal tendon to allow for full mobility, Then the lateral tarsal strip was created.  A 4-0 vicryl suture was taken through the tarsal strip and then secured  to the periosteum of the lateral orbital rim in horizontal mattress fashion. The posterior lamella of the upper and lower eyelid at the lateral canthus was closed with 4-0 vicryl suture. The lash line and skin were then closed with 6-0 chromic gut suture.  Ointment was placed in the eye.The procedure was then performed on the contralateral eye.   The patient tolerated the procedure well and returned to the post anesthesia care unit in good condition.   Delia Chimes, MD, PhD Ophthalmic Plastic and Reconstructive Surgery [facility] [phone] (office)

## 2022-03-27 NOTE — Transfer of Care (Signed)
Immediate Anesthesia Transfer of Care Note  Patient: Harry Norris  Procedure(s) Performed: REPAIR OF ECTROPION (Bilateral: Eye) CONJUNCTIVOPLASTY (Bilateral: Eye)  Patient Location: PACU  Anesthesia Type:MAC  Level of Consciousness: awake  Airway & Oxygen Therapy: Patient Spontanous Breathing  Post-op Assessment: Report given to RN and Post -op Vital signs reviewed and stable  Post vital signs: Reviewed and stable  Last Vitals:  Vitals Value Taken Time  BP 139/62 03/27/22 1726  Temp    Pulse 84 03/27/22 1728  Resp 15 03/27/22 1728  SpO2 95 % 03/27/22 1728  Vitals shown include unvalidated device data.  Last Pain:  Vitals:   03/27/22 1349  TempSrc: Oral  PainSc: 0-No pain         Complications: No notable events documented.

## 2022-03-27 NOTE — Interval H&P Note (Signed)
History and Physical Interval Note:  03/27/2022 3:06 PM  Harry Norris  has presented today for surgery, with the diagnosis of senile ectropion right and left lower eyelid.  The various methods of treatment have been discussed with the patient and family. After consideration of risks, benefits and other options for treatment, the patient has consented to  Procedure(s): REPAIR OF ECTROPION (Bilateral) CONJUNCTIVOPLASTY (Bilateral) as a surgical intervention.  The patient's history has been reviewed, patient examined, no change in status, stable for surgery.  I have reviewed the patient's chart and labs.  Questions were answered to the patient's satisfaction.     Delia Chimes

## 2022-03-27 NOTE — Anesthesia Postprocedure Evaluation (Signed)
Anesthesia Post Note  Patient: Estate manager/land agent  Procedure(s) Performed: REPAIR OF ECTROPION (Bilateral: Eye) CONJUNCTIVOPLASTY (Bilateral: Eye)     Patient location during evaluation: PACU Anesthesia Type: MAC Level of consciousness: awake Pain management: pain level controlled Vital Signs Assessment: post-procedure vital signs reviewed and stable Respiratory status: spontaneous breathing, nonlabored ventilation, respiratory function stable and patient connected to nasal cannula oxygen Cardiovascular status: stable and blood pressure returned to baseline Postop Assessment: no apparent nausea or vomiting Anesthetic complications: no   No notable events documented.  Last Vitals:  Vitals:   03/27/22 1730 03/27/22 1745  BP: (!) 138/48 (!) 141/52  Pulse: 78 76  Resp: 19 18  Temp: (!) 36.3 C 36.7 C  SpO2: 94% 93%    Last Pain:  Vitals:   03/27/22 1730  TempSrc:   PainSc: 0-No pain                 Saharra Santo P Romuald Mccaslin

## 2022-03-28 ENCOUNTER — Encounter (HOSPITAL_COMMUNITY): Payer: Self-pay | Admitting: Optometry

## 2022-04-03 DIAGNOSIS — L308 Other specified dermatitis: Secondary | ICD-10-CM | POA: Diagnosis not present

## 2022-04-05 DIAGNOSIS — J449 Chronic obstructive pulmonary disease, unspecified: Secondary | ICD-10-CM | POA: Diagnosis not present

## 2022-04-05 DIAGNOSIS — J439 Emphysema, unspecified: Secondary | ICD-10-CM | POA: Diagnosis not present

## 2022-04-05 DIAGNOSIS — J9601 Acute respiratory failure with hypoxia: Secondary | ICD-10-CM | POA: Diagnosis not present

## 2022-04-24 DIAGNOSIS — J449 Chronic obstructive pulmonary disease, unspecified: Secondary | ICD-10-CM | POA: Diagnosis not present

## 2022-04-30 DIAGNOSIS — N1832 Chronic kidney disease, stage 3b: Secondary | ICD-10-CM | POA: Diagnosis not present

## 2022-04-30 DIAGNOSIS — I5022 Chronic systolic (congestive) heart failure: Secondary | ICD-10-CM | POA: Diagnosis not present

## 2022-04-30 DIAGNOSIS — J449 Chronic obstructive pulmonary disease, unspecified: Secondary | ICD-10-CM | POA: Diagnosis not present

## 2022-05-06 DIAGNOSIS — J449 Chronic obstructive pulmonary disease, unspecified: Secondary | ICD-10-CM | POA: Diagnosis not present

## 2022-05-06 DIAGNOSIS — J9601 Acute respiratory failure with hypoxia: Secondary | ICD-10-CM | POA: Diagnosis not present

## 2022-05-06 DIAGNOSIS — J439 Emphysema, unspecified: Secondary | ICD-10-CM | POA: Diagnosis not present

## 2022-05-08 DIAGNOSIS — Z9181 History of falling: Secondary | ICD-10-CM | POA: Diagnosis not present

## 2022-05-08 DIAGNOSIS — Z0000 Encounter for general adult medical examination without abnormal findings: Secondary | ICD-10-CM | POA: Diagnosis not present

## 2022-05-30 DIAGNOSIS — N1832 Chronic kidney disease, stage 3b: Secondary | ICD-10-CM | POA: Diagnosis not present

## 2022-05-30 DIAGNOSIS — I10 Essential (primary) hypertension: Secondary | ICD-10-CM | POA: Diagnosis not present

## 2022-06-05 DIAGNOSIS — J449 Chronic obstructive pulmonary disease, unspecified: Secondary | ICD-10-CM | POA: Diagnosis not present

## 2022-06-05 DIAGNOSIS — J439 Emphysema, unspecified: Secondary | ICD-10-CM | POA: Diagnosis not present

## 2022-06-05 DIAGNOSIS — J9601 Acute respiratory failure with hypoxia: Secondary | ICD-10-CM | POA: Diagnosis not present

## 2022-06-22 ENCOUNTER — Encounter: Payer: Self-pay | Admitting: Cardiology

## 2022-06-22 ENCOUNTER — Ambulatory Visit: Payer: Medicare Other | Attending: Cardiology | Admitting: Cardiology

## 2022-06-22 VITALS — BP 134/56 | HR 68 | Ht 70.0 in | Wt 159.4 lb

## 2022-06-22 DIAGNOSIS — I429 Cardiomyopathy, unspecified: Secondary | ICD-10-CM | POA: Diagnosis not present

## 2022-06-22 DIAGNOSIS — I5022 Chronic systolic (congestive) heart failure: Secondary | ICD-10-CM | POA: Diagnosis not present

## 2022-06-22 DIAGNOSIS — E782 Mixed hyperlipidemia: Secondary | ICD-10-CM | POA: Diagnosis not present

## 2022-06-22 DIAGNOSIS — I6529 Occlusion and stenosis of unspecified carotid artery: Secondary | ICD-10-CM | POA: Diagnosis not present

## 2022-06-22 DIAGNOSIS — I447 Left bundle-branch block, unspecified: Secondary | ICD-10-CM

## 2022-06-22 DIAGNOSIS — I110 Hypertensive heart disease with heart failure: Secondary | ICD-10-CM | POA: Diagnosis not present

## 2022-06-22 NOTE — Progress Notes (Signed)
Cardiology Office Note:    Date:  06/22/2022   ID:  Harry Norris, DOB 1936/08/31, MRN [id number]  PCP:  [name], MD  Cardiologist:  Shirlee More, MD    Referring MD: [name], MD    ASSESSMENT:    1. Cardiomyopathy, unspecified type ([place])   2. Hypertensive heart disease with chronic systolic congestive heart failure ([place])   3. Mixed hyperlipidemia   4. LBBB (left bundle branch block)   5. Occlusion of carotid artery, unspecified laterality    PLAN:    In order of problems listed above:  He seems to be doing better his heart failure is compensated with his current diuretic blood pressure is controlled and is not having symptomatic orthostatic hypotension he will continue his ARB Stable continue his statin with peripheral arterial carotid disease He has a history of carotid bruits has seen vascular surgery in the past.   Next appointment: 1 year   Medication Adjustments/Labs and Tests Ordered: Current medicines are reviewed at length with the patient today.  Concerns regarding medicines are outlined above.  No orders of the defined types were placed in this encounter.  No orders of the defined types were placed in this encounter.   Chief Complaint  Patient presents with   Follow-up   Cardiomyopathy    History of Present Illness:    Harry Norris is a 86 y.o. male with a hx of left bundle branch block hypertension hyperlipidemia PAD COPD chronic venous insufficiency abdominal aortic aneurysm followed by vascular surgery cardiomyopathy EF 35 to 40% and bradycardia with discontinuation of beta-blocker last seen 11/28/2020.  He has had previous syncope related to orthostatic hypotension..  An echocardiogram performed last November which showed a reduced EF 25 to 30% normal right ventricular size and function and although he has an ejection murmur no evidence of aortic stenosis. Compliance with diet, lifestyle and medications: Yes  He is doing  better than last visit after the death of his wife Lives independently his son checks on him every day He is pleased with the quality of his life he has had no falls edema shortness of breath chest pain palpitation or syncope He is not having edema.  Most recent labs 02/17/2022 cholesterol 186 LDL 114 A1c 6.3 hemoglobin 11.2 creatinine 1.79 potassium 3.9 Past Medical History:  Diagnosis Date   AAA (abdominal aortic aneurysm) ([place])    Acute hypoxemic respiratory failure ([place]) 07/12/2021   Anemia    Asthma 02/07/2018   Benign neoplasm of colon    Blood in the stool    Bradycardia 12/19/2018   Bronchitis    Cancer ([place])    prostate   Cardiomyopathy ([place]) 12/18/2015   Carotid artery occlusion    Chronic obstructive pulmonary disease with acute exacerbation ([place]) 07/12/2021   Chronic rhinitis 02/26/2016   Chronic venous insufficiency 12/18/2015   CKD (chronic kidney disease), stage III ([place]) 07/12/2021   Community acquired pneumonia of right lower lobe of lung 07/12/2021   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   PFTs 01/18/14:  FeV1 1.58 45% FVC 2.0 50% FeV1/FVC 79% Fef 25-75 795 ( 40% improved after BD) CXR: Copd changes ONO 03/07/14 desats to <88% on RA    Diarrhea    Diverticulitis    DM type 2 (diabetes mellitus, type 2) ([place])    patient denies every being diagnosed with diabetes   Fatigue 12/12/2018   Hyperlipidemia    Hypertension    Hypertensive heart disease  Hypoxemia 04/10/2014   Overview:  Last Assessment & Plan:  Cont nocturnal oxygen therapy 2L    Influenza 07/29/2021   Kidney stone    LBBB (left bundle branch block) 12/18/2015   Leg swelling 11/28/2013   Orthostatic hypotension 12/19/2018   Other symptoms involving cardiovascular system 11/28/2013   Pressure injury of skin 07/30/2021   Prostate cancer [facility])    prostate    Renal insufficiency 09/26/2021   Respiratory failure ([place]) 07/29/2021   Sepsis ([place]) 07/12/2021   Syncope 12/18/2015   Varicose veins of  lower extremities with other complications 83/66/2947    Past Surgical History:  Procedure Laterality Date    cardiac catheterization  12/29/2009   CATARACT EXTRACTION Bilateral    ECTROPION REPAIR Bilateral 03/27/2022   Procedure: REPAIR OF ECTROPION;  Surgeon: Delia Chimes, MD;  Location: [facility];  Service: Ophthalmology;  Laterality: Bilateral;   ENDOVENOUS ABLATION SAPHENOUS VEIN W/ LASER Right 03/29/2014   EVLA right greater saphenous vein     gold seed implants  08/31/2009   treatment of prostate cancer   HERNIA REPAIR Bilateral    LITHOTRIPSY  08/31/2008   PROSTATE SURGERY     pt reports he "think they removed" his prostate   right ear surgery      Current Medications: Current Meds  Medication Sig   albuterol (VENTOLIN HFA) 108 (90 Base) MCG/ACT inhaler Inhale 2 puffs into the lungs every 6 (six) hours as needed for wheezing or shortness of breath.   aspirin EC 81 MG tablet Take 81 mg by mouth daily. Swallow whole.   atorvastatin (LIPITOR) 40 MG tablet Take 40 mg by mouth at bedtime.   fenofibrate 160 MG tablet Take 160 mg by mouth daily.   FLOVENT HFA 110 MCG/ACT inhaler Inhale 2 puffs into the lungs 2 (two) times daily as needed (Shortness of breath).   furosemide (LASIX) 40 MG tablet Take 40 mg by mouth daily.   hydrOXYzine (ATARAX) 25 MG tablet Take 25 mg by mouth at bedtime.   losartan (COZAAR) 100 MG tablet Take 100 mg by mouth daily.   melatonin 5 MG TABS Take 5 mg by mouth at bedtime.   Multiple Vitamins-Minerals (CENTRUM SILVER 50+MEN) TABS Take 1 tablet by mouth daily with breakfast.   Omega-3 Fatty Acids (FISH OIL) 1000 MG CAPS Take 1,000 mg by mouth 2 (two) times daily.   OXYGEN Inhale 2 L/min into the lungs daily as needed (for shortness of breath).   vitamin C (ASCORBIC ACID) 500 MG tablet Take 500 mg by mouth daily.     Allergies:   Ace inhibitors   Social History   Socioeconomic History   Marital status: Married    Spouse name: Not on file   Number  of children: Not on file   Years of education: Not on file   Highest education level: Not on file  Occupational History   Not on file  Tobacco Use   Smoking status: Former    Packs/day: 2.50    Years: 40.00    Total pack years: 100.00    Types: Cigarettes    Quit date: 09/01/1999    Years since quitting: 22.8   Smokeless tobacco: Never  Vaping Use   Vaping Use: Never used  Substance and Sexual Activity   Alcohol use: No   Drug use: No   Sexual activity: Not on file  Other Topics Concern   Not on file  Social History Narrative   Lives with wife.   Retired.  Telephone business prior.   Social Determinants of Health   Financial Resource Strain: Not on file  Food Insecurity: No Food Insecurity (08/07/2021)   Hunger Vital Sign    Worried About Running Out of Food in the Last Year: Never true    Ran Out of Food in the Last Year: Never true  Transportation Needs: No Transportation Needs (08/12/2021)   PRAPARE - Hydrologist (Medical): No    Lack of Transportation (Non-Medical): No  Physical Activity: Not on file  Stress: Not on file  Social Connections: Not on file     Family History: The patient's family history includes Asthma in his sister; Cancer - Colon in his mother; Diabetes in his mother; Emphysema in his sister; Heart disease in his sister; Heart failure in his father; Hypertension in his mother; Kidney failure in his father. ROS:   Please see the history of present illness.    All other systems reviewed and are negative.  EKGs/Labs/Other Studies Reviewed:    The following studies were reviewed today:   Recent Labs: 08/02/2021: Magnesium 2.0 09/26/2021: B Natriuretic Peptide 118.8 09/27/2021: ALT 37 03/27/2022: BUN 27; Creatinine, Ser 1.79; Hemoglobin 11.2; Platelets 286; Potassium 3.9; Sodium 140  Recent Lipid Panel    Component Value Date/Time   TRIG 150 (H) 07/30/2021 0786    Physical Exam:    VS:  BP (!) 134/56 (BP  Location: Left Arm, Patient Position: Sitting, Cuff Size: Normal)   Pulse 68   Ht '5\' 10"'$  (1.778 m)   Wt 159 lb 6.4 oz (72.3 kg)   SpO2 97%   BMI 22.87 kg/m     Wt Readings from Last 3 Encounters:  06/22/22 159 lb 6.4 oz (72.3 kg)  03/27/22 156 lb 12.8 oz (71.1 kg)  03/19/22 156 lb 12.8 oz (71.1 kg)     GEN: He looks frail today well nourished, well developed in no acute distress HEENT: Normal NECK: No JVD; No carotid bruits LYMPHATICS: No lymphadenopathy CARDIAC: S2 is paradoxical RRR, grade 1/6 murmur systolic ejection aortic area he has bilateral carotid bruit RESPIRATORY:  Clear to auscultation without rales, wheezing or rhonchi  ABDOMEN: Soft, non-tender, non-distended MUSCULOSKELETAL:  No edema; No deformity  SKIN: Warm and dry NEUROLOGIC:  Alert and oriented x 3 PSYCHIATRIC:  Normal affect    Signed, Shirlee More, MD  06/22/2022 1:22 PM     [facility]

## 2022-06-22 NOTE — Patient Instructions (Signed)
Medication Instructions:  Your physician recommends that you continue on your current medications as directed. Please refer to the Current Medication list given to you today.  *If you need a refill on your cardiac medications before your next appointment, please call your pharmacy*   Lab Work: NONE If you have labs (blood work) drawn today and your tests are completely normal, you will receive your results only by: [place] (if you have MyChart) OR A paper copy in the mail If you have any lab test that is abnormal or we need to change your treatment, we will call you to review the results.   Testing/Procedures: NONE   Follow-Up: At [facility], you and your health needs are our priority.  As part of our continuing mission to provide you with exceptional heart care, we have created designated Provider Care Teams.  These Care Teams include your primary Cardiologist (physician) and Advanced Practice Providers (APPs -  Physician Assistants and Nurse Practitioners) who all work together to provide you with the care you need, when you need it.  We recommend signing up for the patient portal called "MyChart".  Sign up information is provided on this After Visit Summary.  MyChart is used to connect with patients for Virtual Visits (Telemedicine).  Patients are able to view lab/test results, encounter notes, upcoming appointments, etc.  Non-urgent messages can be sent to your provider as well.   To learn more about what you can do with MyChart, go to [email or web address].    Your next appointment:   1 year(s)  The format for your next appointment:   In Person  Provider:   Shirlee More, MD    Other Instructions Use a moisturizer on legs daily  Important Information About Sugar

## 2022-06-24 DIAGNOSIS — J449 Chronic obstructive pulmonary disease, unspecified: Secondary | ICD-10-CM | POA: Diagnosis not present

## 2022-07-06 DIAGNOSIS — J449 Chronic obstructive pulmonary disease, unspecified: Secondary | ICD-10-CM | POA: Diagnosis not present

## 2022-07-06 DIAGNOSIS — J9601 Acute respiratory failure with hypoxia: Secondary | ICD-10-CM | POA: Diagnosis not present

## 2022-07-06 DIAGNOSIS — J439 Emphysema, unspecified: Secondary | ICD-10-CM | POA: Diagnosis not present

## 2022-07-27 DIAGNOSIS — J449 Chronic obstructive pulmonary disease, unspecified: Secondary | ICD-10-CM | POA: Diagnosis not present

## 2022-07-31 ENCOUNTER — Encounter (HOSPITAL_BASED_OUTPATIENT_CLINIC_OR_DEPARTMENT_OTHER): Payer: Self-pay | Admitting: Emergency Medicine

## 2022-07-31 ENCOUNTER — Encounter (HOSPITAL_COMMUNITY): Payer: Self-pay

## 2022-07-31 ENCOUNTER — Inpatient Hospital Stay (HOSPITAL_BASED_OUTPATIENT_CLINIC_OR_DEPARTMENT_OTHER)
Admission: EM | Admit: 2022-07-31 | Discharge: 2022-08-02 | DRG: 178 | Disposition: A | Discharge status: Home Health | Payer: Medicare Other | Attending: Internal Medicine | Admitting: Internal Medicine

## 2022-07-31 ENCOUNTER — Emergency Department (HOSPITAL_BASED_OUTPATIENT_CLINIC_OR_DEPARTMENT_OTHER): Payer: Medicare Other

## 2022-07-31 ENCOUNTER — Other Ambulatory Visit: Payer: Self-pay

## 2022-07-31 DIAGNOSIS — Z86010 Personal history of colonic polyps: Secondary | ICD-10-CM

## 2022-07-31 DIAGNOSIS — H9190 Unspecified hearing loss, unspecified ear: Secondary | ICD-10-CM | POA: Diagnosis not present

## 2022-07-31 DIAGNOSIS — N1832 Chronic kidney disease, stage 3b: Secondary | ICD-10-CM | POA: Diagnosis present

## 2022-07-31 DIAGNOSIS — I872 Venous insufficiency (chronic) (peripheral): Secondary | ICD-10-CM | POA: Diagnosis not present

## 2022-07-31 DIAGNOSIS — Z9981 Dependence on supplemental oxygen: Secondary | ICD-10-CM | POA: Diagnosis not present

## 2022-07-31 DIAGNOSIS — J690 Pneumonitis due to inhalation of food and vomit: Principal | ICD-10-CM | POA: Diagnosis present

## 2022-07-31 DIAGNOSIS — Z1152 Encounter for screening for COVID-19: Secondary | ICD-10-CM

## 2022-07-31 DIAGNOSIS — D631 Anemia in chronic kidney disease: Secondary | ICD-10-CM | POA: Diagnosis present

## 2022-07-31 DIAGNOSIS — Z79899 Other long term (current) drug therapy: Secondary | ICD-10-CM

## 2022-07-31 DIAGNOSIS — Z8546 Personal history of malignant neoplasm of prostate: Secondary | ICD-10-CM

## 2022-07-31 DIAGNOSIS — R911 Solitary pulmonary nodule: Secondary | ICD-10-CM | POA: Diagnosis present

## 2022-07-31 DIAGNOSIS — J90 Pleural effusion, not elsewhere classified: Secondary | ICD-10-CM | POA: Diagnosis not present

## 2022-07-31 DIAGNOSIS — E785 Hyperlipidemia, unspecified: Secondary | ICD-10-CM | POA: Diagnosis not present

## 2022-07-31 DIAGNOSIS — I130 Hypertensive heart and chronic kidney disease with heart failure and stage 1 through stage 4 chronic kidney disease, or unspecified chronic kidney disease: Secondary | ICD-10-CM | POA: Diagnosis not present

## 2022-07-31 DIAGNOSIS — J439 Emphysema, unspecified: Secondary | ICD-10-CM | POA: Diagnosis not present

## 2022-07-31 DIAGNOSIS — I429 Cardiomyopathy, unspecified: Secondary | ICD-10-CM | POA: Diagnosis present

## 2022-07-31 DIAGNOSIS — I6529 Occlusion and stenosis of unspecified carotid artery: Secondary | ICD-10-CM | POA: Diagnosis not present

## 2022-07-31 DIAGNOSIS — Z87442 Personal history of urinary calculi: Secondary | ICD-10-CM

## 2022-07-31 DIAGNOSIS — J9611 Chronic respiratory failure with hypoxia: Secondary | ICD-10-CM | POA: Diagnosis not present

## 2022-07-31 DIAGNOSIS — J432 Centrilobular emphysema: Secondary | ICD-10-CM | POA: Diagnosis not present

## 2022-07-31 DIAGNOSIS — E1122 Type 2 diabetes mellitus with diabetic chronic kidney disease: Secondary | ICD-10-CM | POA: Diagnosis not present

## 2022-07-31 DIAGNOSIS — I714 Abdominal aortic aneurysm, without rupture, unspecified: Secondary | ICD-10-CM | POA: Diagnosis not present

## 2022-07-31 DIAGNOSIS — E119 Type 2 diabetes mellitus without complications: Secondary | ICD-10-CM

## 2022-07-31 DIAGNOSIS — I5022 Chronic systolic (congestive) heart failure: Secondary | ICD-10-CM | POA: Diagnosis present

## 2022-07-31 DIAGNOSIS — I2510 Atherosclerotic heart disease of native coronary artery without angina pectoris: Secondary | ICD-10-CM | POA: Diagnosis not present

## 2022-07-31 DIAGNOSIS — Z825 Family history of asthma and other chronic lower respiratory diseases: Secondary | ICD-10-CM

## 2022-07-31 DIAGNOSIS — E1159 Type 2 diabetes mellitus with other circulatory complications: Secondary | ICD-10-CM | POA: Diagnosis not present

## 2022-07-31 DIAGNOSIS — Z87891 Personal history of nicotine dependence: Secondary | ICD-10-CM

## 2022-07-31 DIAGNOSIS — N179 Acute kidney failure, unspecified: Secondary | ICD-10-CM | POA: Diagnosis present

## 2022-07-31 DIAGNOSIS — Z841 Family history of disorders of kidney and ureter: Secondary | ICD-10-CM

## 2022-07-31 DIAGNOSIS — R059 Cough, unspecified: Secondary | ICD-10-CM | POA: Diagnosis not present

## 2022-07-31 DIAGNOSIS — R918 Other nonspecific abnormal finding of lung field: Secondary | ICD-10-CM | POA: Diagnosis not present

## 2022-07-31 DIAGNOSIS — R000 Tachycardia, unspecified: Secondary | ICD-10-CM | POA: Diagnosis not present

## 2022-07-31 DIAGNOSIS — Z888 Allergy status to other drugs, medicaments and biological substances status: Secondary | ICD-10-CM | POA: Diagnosis not present

## 2022-07-31 DIAGNOSIS — Z7982 Long term (current) use of aspirin: Secondary | ICD-10-CM | POA: Diagnosis not present

## 2022-07-31 DIAGNOSIS — Z800 Family history of malignant neoplasm of digestive organs: Secondary | ICD-10-CM

## 2022-07-31 DIAGNOSIS — E1151 Type 2 diabetes mellitus with diabetic peripheral angiopathy without gangrene: Secondary | ICD-10-CM | POA: Diagnosis present

## 2022-07-31 DIAGNOSIS — Z833 Family history of diabetes mellitus: Secondary | ICD-10-CM

## 2022-07-31 DIAGNOSIS — Z8249 Family history of ischemic heart disease and other diseases of the circulatory system: Secondary | ICD-10-CM

## 2022-07-31 LAB — COMPREHENSIVE METABOLIC PANEL
ALT: 13 U/L (ref 0–44)
AST: 21 U/L (ref 15–41)
Albumin: 3 g/dL — ABNORMAL LOW (ref 3.5–5.0)
Alkaline Phosphatase: 41 U/L (ref 38–126)
Anion gap: 10 (ref 5–15)
BUN: 29 mg/dL — ABNORMAL HIGH (ref 8–23)
CO2: 30 mmol/L (ref 22–32)
Calcium: 8.8 mg/dL — ABNORMAL LOW (ref 8.9–10.3)
Chloride: 95 mmol/L — ABNORMAL LOW (ref 98–111)
Creatinine, Ser: 1.98 mg/dL — ABNORMAL HIGH (ref 0.61–1.24)
GFR, Estimated: 33 mL/min — ABNORMAL LOW (ref >60)
Glucose, Bld: 121 mg/dL — ABNORMAL HIGH (ref 70–99)
Potassium: 3.9 mmol/L (ref 3.5–5.1)
Sodium: 135 mmol/L (ref 135–145)
Total Bilirubin: 0.9 mg/dL (ref 0.3–1.2)
Total Protein: 6.7 g/dL (ref 6.5–8.1)

## 2022-07-31 LAB — TROPONIN I (HIGH SENSITIVITY)
Troponin I (High Sensitivity): 28 ng/L — ABNORMAL HIGH (ref <18)
Troponin I (High Sensitivity): 33 ng/L — ABNORMAL HIGH (ref <18)

## 2022-07-31 LAB — CBC WITH DIFFERENTIAL/PLATELET
Abs Immature Granulocytes: 0.09 10*3/uL — ABNORMAL HIGH (ref 0.00–0.07)
Basophils Absolute: 0 10*3/uL (ref 0.0–0.1)
Basophils Relative: 0 %
Eosinophils Absolute: 0.1 10*3/uL (ref 0.0–0.5)
Eosinophils Relative: 1 %
HCT: 32.9 % — ABNORMAL LOW (ref 39.0–52.0)
Hemoglobin: 10.6 g/dL — ABNORMAL LOW (ref 13.0–17.0)
Immature Granulocytes: 1 %
Lymphocytes Relative: 16 %
Lymphs Abs: 1.2 10*3/uL (ref 0.7–4.0)
MCH: 29.6 pg (ref 26.0–34.0)
MCHC: 32.2 g/dL (ref 30.0–36.0)
MCV: 91.9 fL (ref 80.0–100.0)
Monocytes Absolute: 0.7 10*3/uL (ref 0.1–1.0)
Monocytes Relative: 9 %
Neutro Abs: 5.3 10*3/uL (ref 1.7–7.7)
Neutrophils Relative %: 73 %
Platelets: 248 10*3/uL (ref 150–400)
RBC: 3.58 MIL/uL — ABNORMAL LOW (ref 4.22–5.81)
RDW: 15.3 % (ref 11.5–15.5)
WBC: 7.4 10*3/uL (ref 4.0–10.5)
nRBC: 0 % (ref 0.0–0.2)

## 2022-07-31 LAB — MAGNESIUM: Magnesium: 1.9 mg/dL (ref 1.7–2.4)

## 2022-07-31 LAB — RESP PANEL BY RT-PCR (FLU A&B, COVID) ARPGX2
Influenza A by PCR: NEGATIVE
Influenza B by PCR: NEGATIVE
SARS Coronavirus 2 by RT PCR: NEGATIVE

## 2022-07-31 LAB — BRAIN NATRIURETIC PEPTIDE: B Natriuretic Peptide: 845.9 pg/mL — ABNORMAL HIGH (ref 0.0–100.0)

## 2022-07-31 MED ORDER — SODIUM CHLORIDE 0.9 % IV SOLN: INTRAVENOUS | Status: DC | PRN

## 2022-07-31 MED ORDER — IOHEXOL 350 MG/ML SOLN
75.0000 mL | Freq: Once | INTRAVENOUS | Status: AC | PRN
  Administered 2022-07-31: 75 mL via INTRAVENOUS

## 2022-07-31 MED ORDER — SODIUM CHLORIDE 0.9 % IV SOLN: 500.0000 mg | INTRAVENOUS | Status: DC

## 2022-07-31 MED ORDER — SODIUM CHLORIDE 0.9 % IV SOLN
1.0000 g | Freq: Once | INTRAVENOUS | Status: AC
  Administered 2022-07-31: 1 g via INTRAVENOUS
  Filled 2022-07-31: qty 10

## 2022-07-31 MED ORDER — ARFORMOTEROL TARTRATE 15 MCG/2ML IN NEBU
15.0000 ug | INHALATION_SOLUTION | Freq: Two times a day (BID) | RESPIRATORY_TRACT | Status: DC
  Administered 2022-08-01 – 2022-08-02 (×3): 15 ug via RESPIRATORY_TRACT
  Filled 2022-07-31 (×2): qty 2

## 2022-07-31 MED ORDER — SODIUM CHLORIDE 0.9 % IV SOLN
500.0000 mg | Freq: Once | INTRAVENOUS | Status: AC
  Administered 2022-07-31: 500 mg via INTRAVENOUS
  Filled 2022-07-31: qty 5

## 2022-07-31 MED ORDER — SODIUM CHLORIDE 0.9 % IV SOLN: 2.0000 g | INTRAVENOUS | Status: DC

## 2022-07-31 MED ORDER — LACTATED RINGERS IV BOLUS
500.0000 mL | Freq: Once | INTRAVENOUS | Status: AC
  Administered 2022-07-31: 500 mL via INTRAVENOUS

## 2022-07-31 MED ORDER — ENOXAPARIN SODIUM 30 MG/0.3ML IJ SOSY: 30.0000 mg | PREFILLED_SYRINGE | INTRAMUSCULAR | Status: DC

## 2022-07-31 MED ORDER — DM-GUAIFENESIN ER 30-600 MG PO TB12
1.0000 | ORAL_TABLET | Freq: Two times a day (BID) | ORAL | Status: DC | PRN
  Administered 2022-07-31: 1 via ORAL
  Filled 2022-07-31: qty 1

## 2022-07-31 MED ORDER — BUDESONIDE 0.25 MG/2ML IN SUSP
0.2500 mg | Freq: Two times a day (BID) | RESPIRATORY_TRACT | Status: DC
  Administered 2022-08-01 – 2022-08-02 (×3): 0.25 mg via RESPIRATORY_TRACT
  Filled 2022-07-31 (×3): qty 2

## 2022-07-31 NOTE — Assessment & Plan Note (Signed)
Stable.  Not in acute exacerbation.

## 2022-07-31 NOTE — Assessment & Plan Note (Signed)
-  without hypoxia. O2 saturation around 95% on room air. -Negative flu/COVID PCR -Continue IV Rocephin and azithromycin

## 2022-07-31 NOTE — ED Provider Notes (Signed)
[facility] EMERGENCY DEPARTMENT Provider Note   CSN: [id number] Arrival date & time: 07/31/22  1129     History  Chief Complaint  Patient presents with   Cough    Harry Norris is a 86 y.o. male.  Hypertension, hyperlipidemia, type 2 diabetes, COPD, cardiomyopathy, asthma, CHF with an ejection fraction of 25 to 30% (07/30/2021) who presents to the ED for evaluation of worsening shortness of breath and cough.  Patient states that on 07/25/2022 he started to feel more short of breath and usually does and had a dry cough.  He was seen at urgent care 2 days later on 07/27/2022 and diagnosed with flu.  Was started on Tamiflu at that time.  He states that since that time his symptoms have progressively gotten worse.  His cough is now more prevalent and is now productive of green sputum.  Patient also reports feeling feverish 3 to 4 days ago.  Has been taking Tylenol daily for this, does not feel feverish at this time.  Denies chest pain, dizziness, lightheadedness, abdominal pain, nausea, vomiting.  Patient does not eat or drink a lot at home.  Apparently the home health aide has been encouraging him to drink more and has been giving him electrolyte supplements daily.   Cough Associated symptoms: shortness of breath        Home Medications Prior to Admission medications   Medication Sig Start Date End Date Taking? Authorizing Provider  albuterol (VENTOLIN HFA) 108 (90 Base) MCG/ACT inhaler Inhale 2 puffs into the lungs every 6 (six) hours as needed for wheezing or shortness of breath.    [provider]  aspirin EC 81 MG tablet Take 81 mg by mouth daily. Swallow whole.    [provider]  atorvastatin (LIPITOR) 40 MG tablet Take 40 mg by mouth at bedtime.    [provider]  erythromycin ophthalmic ointment Apply to incisions and lashes three times daily Patient not taking: Reported on 06/22/2022 03/27/22   Delia Chimes, MD  fenofibrate 160 MG  tablet Take 160 mg by mouth daily.    [provider]  FLOVENT HFA 110 MCG/ACT inhaler Inhale 2 puffs into the lungs 2 (two) times daily as needed (Shortness of breath). 09/09/21   [provider]  formoterol (PERFOROMIST) 20 MCG/2ML nebulizer solution Take 20 mcg by nebulization 2 (two) times daily. Patient not taking: Reported on 06/22/2022    [provider]  furosemide (LASIX) 40 MG tablet Take 40 mg by mouth daily. 12/03/21   [provider]  hydrOXYzine (ATARAX) 25 MG tablet Take 25 mg by mouth at bedtime.    [provider]  losartan (COZAAR) 100 MG tablet Take 100 mg by mouth daily. 12/16/21   [provider]  melatonin 5 MG TABS Take 5 mg by mouth at bedtime.    [provider]  Multiple Vitamins-Minerals (CENTRUM SILVER 50+MEN) TABS Take 1 tablet by mouth daily with breakfast.    [provider]  Omega-3 Fatty Acids (FISH OIL) 1000 MG CAPS Take 1,000 mg by mouth 2 (two) times daily.    [provider]  OXYGEN Inhale 2 L/min into the lungs daily as needed (for shortness of breath).    [provider]  pantoprazole (PROTONIX) 40 MG tablet Take 1 tablet (40 mg total) by mouth 2 (two) times daily. Patient not taking: Reported on 06/22/2022 09/29/21   Gerrit Heck, MD  vitamin C (ASCORBIC ACID) 500 MG tablet Take 500 mg by  mouth daily.    [provider]  vitamin E 400 UNIT capsule Take 400 Units by mouth daily. Patient not taking: Reported on 06/22/2022    [provider]      Allergies    Ace inhibitors    Review of Systems   Review of Systems  Respiratory:  Positive for cough and shortness of breath.   All other systems reviewed and are negative.   Physical Exam Updated Vital Signs BP (!) 127/94   Pulse 92   Temp 98.4 F (36.9 C) (Oral)   Resp 20   Ht '5\' 10"'$  (1.778 m)   Wt 73 kg   SpO2 92%   BMI 23.10 kg/m  Physical Exam Vitals and nursing note reviewed.   Constitutional:      General: He is not in acute distress.    Appearance: Normal appearance. He is well-developed.     Comments: Hard of hearing  HENT:     Head: Normocephalic and atraumatic.     Mouth/Throat:     Mouth: Mucous membranes are moist.     Pharynx: Oropharynx is clear. No oropharyngeal exudate or posterior oropharyngeal erythema.  Eyes:     Conjunctiva/sclera: Conjunctivae normal.  Cardiovascular:     Rate and Rhythm: Normal rate and regular rhythm.     Heart sounds: No murmur heard. Pulmonary:     Effort: Pulmonary effort is normal. No respiratory distress.     Breath sounds: Rhonchi (Diffuse) present.  Abdominal:     Palpations: Abdomen is soft.     Tenderness: There is no abdominal tenderness.  Musculoskeletal:        General: No swelling.     Cervical back: Neck supple.  Skin:    General: Skin is warm and dry.     Capillary Refill: Capillary refill takes less than 2 seconds.  Neurological:     General: No focal deficit present.     Mental Status: He is alert and oriented to person, place, and time.  Psychiatric:        Mood and Affect: Mood normal.     ED Results / Procedures / Treatments   Labs (all labs ordered are listed, but only abnormal results are displayed) Labs Reviewed  RESP PANEL BY RT-PCR (FLU A&B, COVID) [id number]  COMPREHENSIVE METABOLIC PANEL  CBC WITH DIFFERENTIAL/PLATELET  BRAIN NATRIURETIC PEPTIDE  TROPONIN I (HIGH SENSITIVITY)    EKG None  Radiology DG Chest 2 View  Result Date: 07/31/2022 CLINICAL DATA:  Cough EXAM: CHEST - 2 VIEW COMPARISON:  Chest x-ray dated September 26, 2021 FINDINGS: Cardiac and mediastinal contours within normal limits. Right-greater-than-left bibasilar consolidations, worsened when compared prior chest radiograph. Possible small right pleural effusion. No evidence of pneumothorax. IMPRESSION: 1. Worsened right-greater-than-left bibasilar consolidations, concerning for infection or aspiration. Recommend  follow-up PA and lateral chest x-ray 6-8 weeks to ensure resolution 2. Possible new small right pleural effusion. Electronically Signed   By: Yetta Glassman M.D.   On: 07/31/2022 12:15    Procedures Procedures    Medications Ordered in ED Medications - No data to display  ED Course/ Medical Decision Making/ A&P Clinical Course as of 07/31/22 1715  Fri Jul 31, 2022  1517 CT Angio Chest PE W/Cm &/Or Wo Cm I personally reviewed the image.  Likely aspiration pneumonia [AS]  1547 Spoke with Dr. Maylene Roes who will admit the patient for aspiration pneumonia [AS]    Clinical Course User Index [AS] Claudie Fisherman Grafton Folk, PA-C  Medical Decision Making Amount and/or Complexity of Data Reviewed Labs: ordered. Radiology: ordered. Decision-making details documented in ED Course.  Risk Prescription drug management. Decision regarding hospitalization.  This patient presents to the ED for concern of cough and shortness of breath, this involves an extensive number of treatment options, and is a complaint that carries with it a high risk of complications and morbidity. The emergent differential diagnosis for shortness of breath includes, but is not limited to, Pulmonary edema, bronchoconstriction, Pneumonia, Pulmonary embolism, Pneumotherax/ Hemothorax, Dysrythmia, ACS.     Co morbidities that complicate the patient evaluation  Hypertension, hyperlipidemia, type 2 diabetes, COPD, cardiomyopathy, asthma, CHF with an ejection fraction of 25 to 30%  My initial workup includes shortness of breath work-up, PE study  Additional history obtained from: Nursing notes from this visit. Previous records within EMR system last EF on 07/30/2021 showed 25 to 30% Family son is present and provides personal history  I ordered, reviewed and interpreted labs which include: CMP, CBC, BNP, troponin, delta troponin, respiratory panel.  Worsening of her baseline creatinines of 1.98 with BUN of  29.  Stable anemia of 10.6.  Initial troponin 28 with repeat 33.  BNP elevated at 845.9   I ordered imaging studies including chest x-ray, CT PE study I independently visualized and interpreted imaging which showed signs of aspiration pneumonia I agree with the radiologist interpretation  Cardiac Monitoring:  The patient was maintained on a cardiac monitor.  I personally viewed and interpreted the cardiac monitored which showed an underlying rhythm of: NSR with frequent PVCs  Consultations Obtained:  I requested consultation with the hospitalist Dr. Maylene Roes,  and discussed lab and imaging findings as well as pertinent plan - they recommend: Admission for aspiration pneumonia  Afebrile, hemodynamically stable.  86 year old male presenting to the ED for evaluation of increasing cough and shortness of breath after being diagnosed with flu.  Physical exam remarkable for adventitious breath sounds in bilateral lungs in all fields and 1+ bilateral lower extremity pitting edema.  Patient is overall well-appearing and did not initially want to be admitted.  Lab work-up remarkable for an elevated BNP 845.9, x-ray does not show signs of pulmonary edema.  Troponins chronically elevated at 28 and 33.  Creatinine elevated to 1.98 with a BUN of 29.  CT PE study does show signs of aspiration pneumonia.  Patient was started on IV antibiotics and admitted to hospitalist for further management.  Patient was agreeable to this after discussing with him the importance of treatment.  He does have an elevated BNP and a very low ejection fraction of 25 to 30% which was approximately 1 year ago.  Will hold off on IV fluids at this time. stable at the time of admission.  Patient's case discussed with Dr. Matilde Sprang who agrees with plan to admit for pneumonia.   Note: Portions of this report may have been transcribed using voice recognition software. Every effort was made to ensure accuracy; however, inadvertent computerized  transcription errors may still be present.          Final Clinical Impression(s) / ED Diagnoses Final diagnoses:  None    Rx / DC Orders ED Discharge Orders     None         Roylene Reason, Hershal Coria 07/31/22 1721    Teressa Lower, MD 08/01/22 [phone]

## 2022-07-31 NOTE — ED Triage Notes (Signed)
DX flu 07/27/22. Persistent cough , was seen at [facility] today , here to rule out pneumonia. Feeling worse he said , lethargic .  Denies chest pain .

## 2022-07-31 NOTE — Assessment & Plan Note (Signed)
Last echocardiogram on 07/30/2021 with a EF of 25 to 30%.

## 2022-07-31 NOTE — Assessment & Plan Note (Signed)
-   Right upper lobe nodularity measuring 7 x 6 mm seen incidentally on CTA chest.  Recommend 8 to 12 weeks follow-up after resolution of acute symptoms.

## 2022-07-31 NOTE — Assessment & Plan Note (Signed)
-   Creatinine mildly elevated on presentation at 1.98 from prior of 1.79. -Give 500 cc of fluid bolus and follow repeat in the morning

## 2022-07-31 NOTE — ED Notes (Signed)
ED Provider at bedside. 

## 2022-07-31 NOTE — Assessment & Plan Note (Signed)
-   Without hyperglycemia at this time.  Continue to follow with morning labs.

## 2022-07-31 NOTE — ED Notes (Signed)
Patient transported to X-ray 

## 2022-07-31 NOTE — ED Notes (Signed)
First contact with patient. Pt arrived via triage from home with c/o cough, recent dx with flu. Pt. Endorses increased shob, is A&OX 4, resp. Slightly labored. HOB elev. For pt comfort.  Pt placed into gown and on cardiac monitor, call light within reach. Patient updated on plan of care. Will continue to monitor patient.

## 2022-07-31 NOTE — H&P (Signed)
History and Physical    Patient: Harry Norris OQH:[id number] DOB: 09-Apr-1936 DOA: 07/31/2022 DOS: the patient was seen and examined on 07/31/2022 PCP: [name], MD  Patient coming from:  [facility] ED  Chief Complaint:  Chief Complaint  Patient presents with   Cough   HPI: Harry Norris is a 86 y.o. male with medical history significant of COPD, Cardiomyopathy, HTN, asthma, T2DM, CKD 3, HLD,  peripheral carotid disease who presents with increasing shortness of breath, cough and found to have aspiration pneumonia.  Pt limited historian. He lives alone and has been having productive cough for the past several days. Reportedly had recent flu diagnosis but was negative in ED .  Denies any shortness of breath.  No chest pain.  No nausea, vomiting or diarrhea.  In the ED, temperature of 98.5, normotension and had to be placed on 2L although no documented hypoxic.  No leukocytosis, stable Hgb at 10.6 Creatinine mildly elevated at 1.98 from prior of 1.79.  Flu/COVID PCR negative.  BNP of 345, troponin of 28 and 33.  CTA negative for PE but showed bibasilar airspace disease consistent with aspiration pneumonia worse on the right with small right-sided effusion.   Review of Systems: As mentioned in the history of present illness. All other systems reviewed and are negative. Past Medical History:  Diagnosis Date   AAA (abdominal aortic aneurysm) ([place])    Acute hypoxemic respiratory failure ([place]) 07/12/2021   Anemia    Asthma 02/07/2018   Benign neoplasm of colon    Blood in the stool    Bradycardia 12/19/2018   Bronchitis    Cancer ([place])    prostate   Cardiomyopathy ([place]) 12/18/2015   Carotid artery occlusion    Chronic obstructive pulmonary disease with acute exacerbation ([place]) 07/12/2021   Chronic rhinitis 02/26/2016   Chronic venous insufficiency 12/18/2015   CKD (chronic kidney disease), stage III ([place]) 07/12/2021   Community acquired pneumonia of right lower lobe of  lung 07/12/2021   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   PFTs 01/18/14:  FeV1 1.58 45% FVC 2.0 50% FeV1/FVC 79% Fef 25-75 795 ( 40% improved after BD) CXR: Copd changes ONO 03/07/14 desats to <88% on RA    Diarrhea    Diverticulitis    DM type 2 (diabetes mellitus, type 2) ([place])    patient denies every being diagnosed with diabetes   Fatigue 12/12/2018   Hyperlipidemia    Hypertension    Hypertensive heart disease    Hypoxemia 04/10/2014   Overview:  Last Assessment & Plan:  Cont nocturnal oxygen therapy 2L    Influenza 07/29/2021   Kidney stone    LBBB (left bundle branch block) 12/18/2015   Leg swelling 11/28/2013   Orthostatic hypotension 12/19/2018   Other symptoms involving cardiovascular system 11/28/2013   Pressure injury of skin 07/30/2021   Prostate cancer ([place])    prostate    Renal insufficiency 09/26/2021   Respiratory failure ([place]) 07/29/2021   Sepsis ([place]) 07/12/2021   Syncope 12/18/2015   Varicose veins of lower extremities with other complications 54/65/6812   Past Surgical History:  Procedure Laterality Date    cardiac catheterization  12/29/2009   CATARACT EXTRACTION Bilateral    ECTROPION REPAIR Bilateral 03/27/2022   Procedure: REPAIR OF ECTROPION;  Surgeon: Delia Chimes, MD;  Location: [facility];  Service: Ophthalmology;  Laterality: Bilateral;   ENDOVENOUS ABLATION SAPHENOUS VEIN W/ LASER Right 03/29/2014   EVLA right greater saphenous vein  gold seed implants  08/31/2009   treatment of prostate cancer   HERNIA REPAIR Bilateral    LITHOTRIPSY  08/31/2008   PROSTATE SURGERY     pt reports he "think they removed" his prostate   right ear surgery     Social History:  reports that he quit smoking about 22 years ago. His smoking use included cigarettes. He has a 100.00 pack-year smoking history. He has never used smokeless tobacco. He reports that he does not drink alcohol and does not use drugs.  Allergies  Allergen Reactions   Ace Inhibitors Cough     Family History  Problem Relation Age of Onset   Cancer - Colon Mother        deceased   Hypertension Mother    Diabetes Mother    Emphysema Sister        deceased   Heart disease Sister    Asthma Sister    Kidney failure Father    Heart failure Father     Prior to Admission medications   Medication Sig Start Date End Date Taking? Authorizing Provider  albuterol (VENTOLIN HFA) 108 (90 Base) MCG/ACT inhaler Inhale 2 puffs into the lungs every 6 (six) hours as needed for wheezing or shortness of breath.    [provider]  aspirin EC 81 MG tablet Take 81 mg by mouth daily. Swallow whole.    [provider]  atorvastatin (LIPITOR) 40 MG tablet Take 40 mg by mouth at bedtime.    [provider]  erythromycin ophthalmic ointment Apply to incisions and lashes three times daily Patient not taking: Reported on 06/22/2022 03/27/22   Delia Chimes, MD  fenofibrate 160 MG tablet Take 160 mg by mouth daily.    [provider]  FLOVENT HFA 110 MCG/ACT inhaler Inhale 2 puffs into the lungs 2 (two) times daily as needed (Shortness of breath). 09/09/21   [provider]  formoterol (PERFOROMIST) 20 MCG/2ML nebulizer solution Take 20 mcg by nebulization 2 (two) times daily. Patient not taking: Reported on 06/22/2022    [provider]  furosemide (LASIX) 40 MG tablet Take 40 mg by mouth daily. 12/03/21   [provider]  hydrOXYzine (ATARAX) 25 MG tablet Take 25 mg by mouth at bedtime.    [provider]  losartan (COZAAR) 100 MG tablet Take 100 mg by mouth daily. 12/16/21   [provider]  melatonin 5 MG TABS Take 5 mg by mouth at bedtime.    [provider]  Multiple Vitamins-Minerals (CENTRUM SILVER 50+MEN) TABS Take 1 tablet by mouth daily with breakfast.    [provider]  Omega-3 Fatty Acids (FISH OIL) 1000 MG CAPS Take 1,000 mg by mouth 2 (two) times daily.    [provider]  OXYGEN  Inhale 2 L/min into the lungs daily as needed (for shortness of breath).    [provider]  pantoprazole (PROTONIX) 40 MG tablet Take 1 tablet (40 mg total) by mouth 2 (two) times daily. Patient not taking: Reported on 06/22/2022 09/29/21   Gerrit Heck, MD  vitamin C (ASCORBIC ACID) 500 MG tablet Take 500 mg by mouth daily.    [provider]  vitamin E 400 UNIT capsule Take 400 Units by mouth daily. Patient not taking: Reported on 06/22/2022    [provider]    Physical Exam: Vitals:   07/31/22 1529 07/31/22 1600 07/31/22 1818 07/31/22 2157  BP:  (!) 146/67 (!) 136/50 (!) 122/46  Pulse:  92  93 (!) 43  Resp: '16 20 20 20  '$ Temp:  (!) 97.4 F (36.3 C) 98.5 F (36.9 C) 98.9 F (37.2 C)  TempSrc:  Oral Oral Oral  SpO2:  99% 93% 95%  Weight:      Height:       Constitutional: NAD, calm, comfortable, elderly male laying upright in bed with frequent productive cough Eyes: lids and conjunctivae normal ENMT: Mucous membranes are moist.  Neck: normal, supple Respiratory: clear to auscultation bilaterally, no wheezing, no crackles. Normal respiratory effort. No accessory muscle use.  Cardiovascular: Regular rate and rhythm, no murmurs / rubs / gallops. No extremity edema.  Abdomen: no tenderness,  Bowel sounds positive.  Musculoskeletal: no clubbing / cyanosis. No joint deformity upper and lower extremities.  Normal muscle tone.  Skin: no rashes, lesions, ulcers. No induration Neurologic: CN 2-12 grossly intact. Strength 5/5 in all 4.  Psychiatric: Normal judgment and insight. Alert and oriented x 3. Normal mood. Data Reviewed:  See HPI  Assessment and Plan: * Aspiration pneumonia ([place]) -without hypoxia. O2 saturation around 95% on room air. -Negative flu/COVID PCR -Continue IV Rocephin and azithromycin  AKI (acute kidney injury) ([place]) - Creatinine mildly elevated on presentation at 1.98 from prior of 1.79. -Give 500 cc of fluid bolus and follow  repeat in the morning  Right upper lobe pulmonary nodule - Right upper lobe nodularity measuring 7 x 6 mm seen incidentally on CTA chest.  Recommend 8 to 12 weeks follow-up after resolution of acute symptoms.  COPD with emphysema Gold C  Stable.  Not in acute exacerbation.  DM type 2 (diabetes mellitus, type 2) ([place]) - Without hyperglycemia at this time.  Continue to follow with morning labs.  Cardiomyopathy ([place]) Last echocardiogram on 07/30/2021 with a EF of 25 to 30%.      Advance Care Planning: Full  Consults: none  Family Communication: none at bedside  Severity of Illness: The appropriate patient status for this patient is INPATIENT. Inpatient status is judged to be reasonable and necessary in order to provide the required intensity of service to ensure the patient's safety. The patient's presenting symptoms, physical exam findings, and initial radiographic and laboratory data in the context of their chronic comorbidities is felt to place them at high risk for further clinical deterioration. Furthermore, it is not anticipated that the patient will be medically stable for discharge from the hospital within 2 midnights of admission.   * I certify that at the point of admission it is my clinical judgment that the patient will require inpatient hospital care spanning beyond 2 midnights from the point of admission due to high intensity of service, high risk for further deterioration and high frequency of surveillance required.*  Author: Orene Desanctis, DO 07/31/2022 10:14 PM  For on call review [email or web address].

## 2022-08-01 ENCOUNTER — Inpatient Hospital Stay (HOSPITAL_COMMUNITY): Payer: Medicare Other

## 2022-08-01 DIAGNOSIS — J690 Pneumonitis due to inhalation of food and vomit: Secondary | ICD-10-CM | POA: Diagnosis not present

## 2022-08-01 LAB — BASIC METABOLIC PANEL
Anion gap: 8 (ref 5–15)
BUN: 27 mg/dL — ABNORMAL HIGH (ref 8–23)
CO2: 28 mmol/L (ref 22–32)
Calcium: 8.6 mg/dL — ABNORMAL LOW (ref 8.9–10.3)
Chloride: 99 mmol/L (ref 98–111)
Creatinine, Ser: 1.61 mg/dL — ABNORMAL HIGH (ref 0.61–1.24)
GFR, Estimated: 42 mL/min — ABNORMAL LOW (ref >60)
Glucose, Bld: 102 mg/dL — ABNORMAL HIGH (ref 70–99)
Potassium: 4.1 mmol/L (ref 3.5–5.1)
Sodium: 135 mmol/L (ref 135–145)

## 2022-08-01 MED ORDER — ALBUTEROL SULFATE (2.5 MG/3ML) 0.083% IN NEBU
2.5000 mg | INHALATION_SOLUTION | RESPIRATORY_TRACT | Status: DC | PRN
  Administered 2022-08-01: 2.5 mg via RESPIRATORY_TRACT
  Filled 2022-08-01: qty 3

## 2022-08-01 MED ORDER — SODIUM CHLORIDE 0.9 % IV SOLN
3.0000 g | Freq: Four times a day (QID) | INTRAVENOUS | Status: DC
  Administered 2022-08-01 – 2022-08-02 (×5): 3 g via INTRAVENOUS
  Filled 2022-08-01 (×6): qty 8

## 2022-08-01 MED ORDER — GUAIFENESIN-DM 100-10 MG/5ML PO SYRP
5.0000 mL | ORAL_SOLUTION | Freq: Four times a day (QID) | ORAL | Status: DC | PRN
  Administered 2022-08-01 – 2022-08-02 (×3): 5 mL via ORAL
  Filled 2022-08-01 (×3): qty 10

## 2022-08-01 MED ORDER — MELATONIN 3 MG PO TABS
3.0000 mg | ORAL_TABLET | Freq: Once | ORAL | Status: AC
  Administered 2022-08-01: 3 mg via ORAL
  Filled 2022-08-01: qty 1

## 2022-08-01 MED ORDER — FUROSEMIDE 40 MG PO TABS
40.0000 mg | ORAL_TABLET | Freq: Every day | ORAL | Status: DC
  Administered 2022-08-01 – 2022-08-02 (×2): 40 mg via ORAL
  Filled 2022-08-01 (×2): qty 1

## 2022-08-01 MED ORDER — ATORVASTATIN CALCIUM 20 MG PO TABS
40.0000 mg | ORAL_TABLET | Freq: Every day | ORAL | Status: DC
  Administered 2022-08-01: 40 mg via ORAL
  Filled 2022-08-01: qty 2

## 2022-08-01 MED ORDER — LOSARTAN POTASSIUM 50 MG PO TABS
100.0000 mg | ORAL_TABLET | Freq: Every day | ORAL | Status: DC
  Administered 2022-08-01 – 2022-08-02 (×2): 100 mg via ORAL
  Filled 2022-08-01 (×3): qty 2

## 2022-08-01 MED ORDER — FENOFIBRATE 160 MG PO TABS
160.0000 mg | ORAL_TABLET | Freq: Every day | ORAL | Status: DC
  Administered 2022-08-01 – 2022-08-02 (×2): 160 mg via ORAL
  Filled 2022-08-01 (×2): qty 1

## 2022-08-01 MED ORDER — ENOXAPARIN SODIUM 40 MG/0.4ML IJ SOSY
40.0000 mg | PREFILLED_SYRINGE | INTRAMUSCULAR | Status: DC
  Administered 2022-08-01 – 2022-08-02 (×2): 40 mg via SUBCUTANEOUS
  Filled 2022-08-01 (×2): qty 0.4

## 2022-08-01 MED ORDER — ASPIRIN 81 MG PO TBEC
81.0000 mg | DELAYED_RELEASE_TABLET | Freq: Every day | ORAL | Status: DC
  Administered 2022-08-01 – 2022-08-02 (×2): 81 mg via ORAL
  Filled 2022-08-01 (×2): qty 1

## 2022-08-01 NOTE — Progress Notes (Signed)
Modified Barium Swallow Progress Note  Patient Details  Name: Harry Norris MRN: [id number] Date of Birth: [birth year]  Today's Date: 08/01/2022  Modified Barium Swallow completed.  Full report located under Chart Review in the Imaging Section.  Brief recommendations include the following:  Clinical Impression  Patient presents with a mild pharyngoesophageal dysphagia as per this MBS. Barium consistencies tested were: nectar thick, thin, puree/pudding, mechanical soft, 2m barium tablet. He exhibited flash penetration (PAS 2) fairly consistently with thin liquids and exhibited a couple instances of penetration that did not immediately clear (PAS 3). Patient did seem to exhibit sensation to penetrate and cough/throat clear was effective to clear penetrate majority of the time, however patient also with persistent [facility] and so difficult to differentiate. Penetrate was very trace and no instances of aspiration before, during or after swallow were observed during this MBS. Patient exhibited trace to mild vallecular sinus and pyriform sinus residuals with thin liquids, trace vallecular residuals with nectar thick liquids and mild vallecular residuals with mechanical soft solids. 13mbarium tablet transited pharyngeally and through esophagus without observed difficulty or delay. Prominent cricopharyngeal bar observed which did appear to at least somewhat reduce bolus transit. Overall, suspect that patient's swallow function is baseline and aspiration risk is mild. SLP will f/u one time to ensure diet toleration and complete education/recommendations.   Swallow Evaluation Recommendations       SLP Diet Recommendations: Regular solids;Dysphagia 3 (Mech soft) solids;Thin liquid   Liquid Administration via: Cup;Straw   Medication Administration: Whole meds with liquid   Supervision: Patient able to self feed   Compensations: Slow rate;Small sips/bites;Clear  throat intermittently   Postural Changes: Seated upright at 90 degrees   Oral Care Recommendations: Oral care BID        JoSonia BallerMA, CCC-SLP Speech Therapy

## 2022-08-01 NOTE — Progress Notes (Signed)
Pharmacy Antibiotic Note  Harry Norris is a 86 y.o. male admitted on 07/31/2022 with aspiration pneumonia. Pharmacy has been consulted for Unasyn dosing.  Plan: Unasyn 3g IV q6h Monitor renal function, cultures, clinical course  Height: '5\' 10"'$  (177.8 cm) Weight: 73 kg (161 lb) IBW/kg (Calculated) : 73  Temp (24hrs), Avg:98.3 F (36.8 C), Min:97.4 F (36.3 C), Max:98.9 F (37.2 C)  Recent Labs  Lab 07/31/22 1230 08/01/22 0500  WBC 7.4  --   CREATININE 1.98* 1.61*    Estimated Creatinine Clearance: 35.3 mL/min (A) (by C-G formula based on SCr of 1.61 mg/dL (H)).    Allergies  Allergen Reactions   Ace Inhibitors Cough    Antimicrobials this admission: 12/1 Ceftriaxone x 1 12/1 Azithromycin x 1 12/2 Unasyn >>  Microbiology results: 12/1 Resp panel: COVID negative, Influenza A/B negative  Thank you for allowing pharmacy to be a part of this patient's care.   Lindell Spar, PharmD, BCPS Clinical Pharmacist  08/01/2022 8:46 AM

## 2022-08-01 NOTE — Evaluation (Signed)
Clinical/Bedside Swallow Evaluation Harry Norris Details  Name: Harry Norris MRN: [id number] Date of Birth: [birth year]  Today's Date: 08/01/2022 Time: SLP Start Time (ACUTE ONLY): 0950 SLP Stop Time (ACUTE ONLY): 1000 SLP Time Calculation (min) (ACUTE ONLY): 10 min  Past Medical History:  Past Medical History:  Diagnosis Date   AAA (abdominal aortic aneurysm) ([place])    Acute hypoxemic respiratory failure ([place]) 07/12/2021   Anemia    Asthma 02/07/2018   Benign neoplasm of colon    Blood in the stool    Bradycardia 12/19/2018   Bronchitis    Cancer ([place])    prostate   Cardiomyopathy ([place]) 12/18/2015   Carotid artery occlusion    Chronic obstructive pulmonary disease with acute exacerbation ([place]) 07/12/2021   Chronic rhinitis 02/26/2016   Chronic venous insufficiency 12/18/2015   CKD (chronic kidney disease), stage III ([place]) 07/12/2021   Community acquired pneumonia of right lower lobe of lung 07/12/2021   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   PFTs 01/18/14:  FeV1 1.58 45% FVC 2.0 50% FeV1/FVC 79% Fef 25-75 795 ( 40% improved after BD) CXR: Copd changes ONO 03/07/14 desats to <88% on RA    Diarrhea    Diverticulitis    DM type 2 (diabetes mellitus, type 2) ([place])    Harry Norris denies every being diagnosed with diabetes   Fatigue 12/12/2018   Hyperlipidemia    Hypertension    Hypertensive heart disease    Hypoxemia 04/10/2014   Overview:  Last Assessment & Plan:  Cont nocturnal oxygen therapy 2L    Influenza 07/29/2021   Kidney stone    LBBB (left bundle branch block) 12/18/2015   Leg swelling 11/28/2013   Orthostatic hypotension 12/19/2018   Other symptoms involving cardiovascular system 11/28/2013   Pressure injury of skin 07/30/2021   Prostate cancer ([place])    prostate    Renal insufficiency 09/26/2021   Respiratory failure ([place]) 07/29/2021   Sepsis ([place]) 07/12/2021   Syncope 12/18/2015   Varicose veins of lower extremities with other complications 57/09/7791   Past  Surgical History:  Past Surgical History:  Procedure Laterality Date    cardiac catheterization  12/29/2009   CATARACT EXTRACTION Bilateral    ECTROPION REPAIR Bilateral 03/27/2022   Procedure: REPAIR OF ECTROPION;  Surgeon: Delia Chimes, MD;  Location: [facility];  Service: Ophthalmology;  Laterality: Bilateral;   ENDOVENOUS ABLATION SAPHENOUS VEIN W/ LASER Right 03/29/2014   EVLA right greater saphenous vein     gold seed implants  08/31/2009   treatment of prostate cancer   HERNIA REPAIR Bilateral    LITHOTRIPSY  08/31/2008   PROSTATE SURGERY     pt reports he "think they removed" his prostate   right ear surgery     HPI:  Harry Norris is an 86 y.o. male with PMH: COPD, cardiomyopathy, HTN, asthma, DM-2, CKD III, HLD, peripheral carotid disease. he presented to the hospital on 07/31/2022 with increasing SOB, cough and found to have aspiration PNA. Flu and Covid PCR negative.    Assessment / Plan / Recommendation  Clinical Impression  Harry Norris presents with questionable dysphagia as per this bedside swallow evaluation. He had persistent coughing which sounded congested but also non-productive. His voice sounded mildly hoarse likely from coughing. Harry Norris denies any h/o or current difficulties with swallowing. As Harry Norris currently has suspected aspiration PNA and he was admitted a year ago for similiar symptoms, SLP recommending MBS to r/o aspiration. When asked if he was ok with this plan, Harry Norris said, "Im  not but I'll do it if you think you need to". SLP planning for MBS today (12/2). SLP Visit Diagnosis: Dysphagia, unspecified (R13.10)    Aspiration Risk  Mild aspiration risk    Diet Recommendation Regular;Dysphagia 3 (Mech soft)   Liquid Administration via: Cup Medication Administration: Whole meds with liquid Supervision: Harry Norris able to self feed Compensations: Slow rate;Small sips/bites Postural Changes: Seated upright at 90 degrees    Other  Recommendations Oral Care  Recommendations: Oral care BID    Recommendations for follow up therapy are one component of a multi-disciplinary discharge planning process, led by the attending physician.  Recommendations may be updated based on Harry Norris status, additional functional criteria and insurance authorization.  Follow up Recommendations Other (comment) (TBD pending MBS results)      Assistance Recommended at Discharge    Functional Status Assessment Harry Norris has had a recent decline in their functional status and demonstrates the ability to make significant improvements in function in a reasonable and predictable amount of time.  Frequency and Duration min 1 x/week  1 week       Prognosis Prognosis for Safe Diet Advancement: Good      Swallow Study   General Date of Onset: 07/31/22 HPI: Harry Norris is an 86 y.o. male with PMH: COPD, cardiomyopathy, HTN, asthma, DM-2, CKD III, HLD, peripheral carotid disease. he presented to the hospital on 07/31/2022 with increasing SOB, cough and found to have aspiration PNA. Flu and Covid PCR negative. Type of Study: Bedside Swallow Evaluation Previous Swallow Assessment: BSE in 2022 with no significant findings Diet Prior to this Study: Regular;Thin liquids Temperature Spikes Noted: No Respiratory Status: Nasal cannula History of Recent Intubation: No Behavior/Cognition: Alert;Cooperative Oral Cavity Assessment: Within Functional Limits Oral Care Completed by SLP: No Oral Cavity - Dentition: Dentures, bottom Vision: Functional for self-feeding Self-Feeding Abilities: Able to feed self Harry Norris Positioning: Upright in bed Baseline Vocal Quality: Normal;Hoarse Volitional Cough: Strong;Congested Volitional Swallow: Able to elicit    Oral/Motor/Sensory Function Overall Oral Motor/Sensory Function: Within functional limits   Ice Chips     Thin Liquid Thin Liquid: Impaired Presentation: Self Fed;Cup Pharyngeal  Phase Impairments: Other (comments) Other Comments:  persistent coughing and unable to differentiate cough related to aspiration/penetration or from PNA    Nectar Thick Nectar Thick Liquid: Impaired Pharyngeal Phase Impairments: Other (comments) Other Comments: persistent coughing and unable to differentiate cough related to aspiration/penetration or from PNA   Honey Thick     Puree Puree: Not tested   Solid     Solid: Not tested      Sonia Baller, MA, CCC-SLP Speech Therapy

## 2022-08-01 NOTE — Progress Notes (Signed)
PROGRESS NOTE  Harry Norris  DOB: 1936-07-18  PCP: [name], MD RZN:[id number]  DOA: 07/31/2022  LOS: 1 day  Hospital Day: 2  Brief narrative: Harry Norris is a 86 y.o. male with PMH significant for DM2, HTN, HLD, CAD, chronic systolic CHF (EF 25 to 30% 07/30/2021), COPD, CKD, carotid artery occlusion, AAA, prostate cancer, chronic anemia, varicose vein. Patient usually has a dry cough.  11/25, he started to feel more short of breath.  11/27, went to urgent care and was diagnosed with flu, started on Tamiflu despite which his symptoms have progressed.  Also reports having fever and reduced oral intake. 12/1, patient presented to the ED with worsening symptoms.  In the ED, patient was afebrile, hemodynamically stable, required 2 L oxygen by nasal cannula Labs with WBC count 7.4, hemoglobin 10.6 BUN/creatinine elevated to 29/1.98, BNP 846, troponin 28>33. Respiratory virus panel negative for flu, COVID Chest x-ray showed worsened right-greater-than-left bibasilar consolidations, concerning for infection or aspiration. CTPA negative for PE but showed bibasilar airspace disease consistent with aspiration pneumonia worse on the right with small right-sided effusion.  Subjective: Patient was seen and examined this morning.  Sitting up in recliner.  Not in distress.  Wants to go home. Seen by speech therapist, underwent MBS.  Only mild risk of aspiration noted Chart reviewed. Remains hemodynamically stable.  Assessment and plan: Bibasilar pneumonia Suspect aspiration Recent influenza COPD exacerbation Diagnosis influenza on 11/27, completed course of Tamiflu respiratory symptoms persisted and hence presented to ED Imaging as above showing bibasilar pneumonia, likely aspiration Currently on IV Rocephin and azithromycin.  I switched to IV Unasyn this morning Only mild risk of aspiration per MBS Continue bronchodilators, Mucinex, incentive spirometry Recent Labs  Lab  07/31/22 1230  WBC 7.4   Acute respiratory failure with hypoxia On low-flow oxygen at rest.  Check ambulatory oxygen requirement  Chronic systolic CHF Cardiomyopathy Essential hypertension Last echo 07/30/2021 with EF 25 to 30% PTA on Lasix 40 mg daily, losartan 100 mg daily. Resume both.  Monitor volume status, blood pressure and renal function.   CKD 3B Baseline creatinine 1.79, mildly elevated to 1.98 at on admission.  Back to baseline already.  Does not meet criteria for AKI at this time. Recent Labs    08/02/21 0032 08/03/21 0712 09/26/21 1125 09/27/21 0146 09/28/21 0254 09/29/21 0247 03/27/22 1437 07/31/22 1230 08/01/22 0500  BUN 28* 28* 86* 81*  80* 68* 58* 27* 29* 27*  CREATININE 0.84 0.90 5.89* 5.04*  5.06* 3.82* 3.28* 1.79* 1.98* 1.61*   CAD/PAD/HLD PTA on aspirin 81 mg daily, Lipitor 40 mg daily, fenofibrate 160 mg daily Continue all  Type 2 diabetes mellitus A1c 5.8 in January 2023 Not on meds at home. Currently on sliding scale insulin with Accu-Cheks No results for input(s): "GLUCAP" in the last 168 hours.  Right upper lobe pulmonary nodule Right upper lobe nodularity measuring 7 x 6 mm seen incidentally on CTA chest.  Recommend 8 to 12 weeks follow-up after resolution of acute symptoms.    Goals of care   Code Status: Full Code    Mobility: Pending PT eval  Infusions:   sodium chloride 10 mL/hr at 07/31/22 1821   ampicillin-sulbactam (UNASYN) IV 3 g (08/01/22 0957)    Scheduled Meds:  arformoterol  15 mcg Nebulization BID   aspirin EC  81 mg Oral Daily   atorvastatin  40 mg Oral QHS   budesonide (PULMICORT) nebulizer solution  0.25 mg Nebulization BID   enoxaparin (  LOVENOX) injection  40 mg Subcutaneous Q24H   fenofibrate  160 mg Oral Daily   furosemide  40 mg Oral Daily   losartan  100 mg Oral Daily    PRN meds: sodium chloride, guaiFENesin-dextromethorphan   Skin assessment:  Pressure Injury 07/31/22 Coccyx Medial Stage 2 -   Partial thickness loss of dermis presenting as a shallow open injury with a red, pink wound bed without slough. (Active)  07/31/22 1930  Location: Coccyx  Location Orientation: Medial  Staging: Stage 2 -  Partial thickness loss of dermis presenting as a shallow open injury with a red, pink wound bed without slough.  Wound Description (Comments):   Present on Admission: Yes    Nutritional status:  Body mass index is 23.1 kg/m.          Diet:  Diet Order             Diet regular Room service appropriate? Yes; Fluid consistency: Thin  Diet effective now                   DVT prophylaxis:  enoxaparin (LOVENOX) injection 40 mg Start: 08/01/22 1000   Antimicrobials: IV Unasyn Fluid: None Consultants: None Family Communication: None at bedside  Status is: Patient  Continue in-hospital care because: Continues to have weakness, oxygen dependence, on IV antibiotics Level of care: Telemetry   Dispo: The patient is from: Home              Anticipated d/c is to: Pending clinical course              Patient currently is not medically stable to d/c.   Difficult to place patient No       Antimicrobials: Anti-infectives (From admission, onward)    Start     Dose/Rate Route Frequency Ordered Stop   08/01/22 1600  azithromycin (ZITHROMAX) 500 mg in sodium chloride 0.9 % 250 mL IVPB  Status:  Discontinued        500 mg 250 mL/hr over 60 Minutes Intravenous Every 24 hours 07/31/22 2208 08/01/22 0825   08/01/22 1200  cefTRIAXone (ROCEPHIN) 2 g in sodium chloride 0.9 % 100 mL IVPB  Status:  Discontinued        2 g 200 mL/hr over 30 Minutes Intravenous Every 24 hours 07/31/22 2208 08/01/22 0825   08/01/22 1000  Ampicillin-Sulbactam (UNASYN) 3 g in sodium chloride 0.9 % 100 mL IVPB        3 g 200 mL/hr over 30 Minutes Intravenous Every 6 hours 08/01/22 0846     07/31/22 1530  cefTRIAXone (ROCEPHIN) 1 g in sodium chloride 0.9 % 100 mL IVPB        1 g 200 mL/hr over 30  Minutes Intravenous  Once 07/31/22 1525 07/31/22 1629   07/31/22 1530  azithromycin (ZITHROMAX) 500 mg in sodium chloride 0.9 % 250 mL IVPB        500 mg 250 mL/hr over 60 Minutes Intravenous  Once 07/31/22 1525 07/31/22 1737       Objective: Vitals:   08/01/22 1010 08/01/22 1346  BP: (!) 125/99 129/66  Pulse: (!) 45 (!) 41  Resp: 20 20  Temp: 98.2 F (36.8 C) 98.6 F (37 C)  SpO2: 95%     Intake/Output Summary (Last 24 hours) at 08/01/2022 1423 Last data filed at 08/01/2022 1300 Gross per 24 hour  Intake 1709.47 ml  Output 800 ml  Net 909.47 ml   Filed Weights   07/31/22 1150  Weight: 73 kg   Weight change:  Body mass index is 23.1 kg/m.   Physical Exam: General exam: Pleasant, elderly Caucasian male.  Not in physical distress Skin: No rashes, lesions or ulcers. HEENT: Atraumatic, normocephalic, no obvious bleeding Lungs: Diminished air entry in both bases.  Coughs on deep breathing CVS: Regular rate and rhythm, no murmur GI/Abd soft, nontender, nondistended, abdominal CNS: Alert, awake, oriented x 3 Psychiatry: Mood appropriate Extremities: No pedal edema, no calf redness  Data Review: I have personally reviewed the laboratory data and studies available.  F/u labs ordered Unresulted Labs (From admission, onward)     Start     Ordered   08/02/22 0500  Creatinine, serum  Tomorrow morning,   R        08/01/22 0900   08/02/22 0500  CBC with Differential/Platelet  Tomorrow morning,   R       Question:  Specimen collection method  Answer:  Lab=Lab collect   08/01/22 1423   08/02/22 4585  Basic metabolic panel  Tomorrow morning,   R       Question:  Specimen collection method  Answer:  Lab=Lab collect   08/01/22 1423            Signed, Terrilee Croak, MD [facility] 08/01/2022

## 2022-08-01 NOTE — Evaluation (Signed)
Physical Therapy Evaluation Patient Details Name: Harry Norris MRN: [id number] DOB: Feb 27, 1936 Today's Date: 08/01/2022  History of Present Illness  Pt 86 yo admit with increased cough, fever and not feeling well, found to have Bilateral pna, flu positive 11/27, acute respiratory failure with hypoxia, chronic systolic CHF and DM2. REcetn admission with multiple falls at home ( 09/26/2021)  Clinical Impression  Pt very independently driven, ad son present during the assessment stating his father is very driven but not always safe . They di have cameras in the  home to help with safely remotely, and a caregiver to check on pt in the morning and afternoon for meds and food. Pt has shown a bit of a decline over the past months and recommend HHPT to continue once he gets home.        Recommendations for follow up therapy are one component of a multi-disciplinary discharge planning process, led by the attending physician.  Recommendations may be updated based on patient status, additional functional criteria and insurance authorization.  Follow Up Recommendations Home health PT      Assistance Recommended at Discharge Frequent or constant Supervision/Assistance  Patient can return home with the following  A little help with walking and/or transfers;Assistance with cooking/housework;A little help with bathing/dressing/bathroom    Equipment Recommendations None recommended by PT  Recommendations for Other Services       Functional Status Assessment Patient has had a recent decline in their functional status and demonstrates the ability to make significant improvements in function in a reasonable and predictable amount of time.     Precautions / Restrictions Precautions Precautions: Fall Restrictions Weight Bearing Restrictions: No      Mobility  Bed Mobility                    Transfers Overall transfer level: Needs assistance Equipment used: Rolling walker (2  wheels) Transfers: Sit to/from Stand Sit to Stand: Supervision                Ambulation/Gait Ambulation/Gait assistance: Supervision Gait Distance (Feet): 60 Feet Assistive device: Rolling walker (2 wheels) Gait Pattern/deviations: Step-through pattern       General Gait Details: fairly steady but sused RW which pt does not like, he wants to use his cane, he is just not strong enough today to try the cane. will progress o the cane, but son and family would really like for him to try to use the rollator at home for safety purposes.  Stairs            Wheelchair Mobility    Modified Rankin (Stroke Patients Only)       Balance Overall balance assessment: Needs assistance Sitting-balance support: Bilateral upper extremity supported, Feet supported Sitting balance-Leahy Scale: Normal     Standing balance support: Bilateral upper extremity supported, During functional activity Standing balance-Leahy Scale: Fair                               Pertinent Vitals/Pain Pain Assessment Pain Assessment: No/denies pain    Home Living Family/patient expects to be discharged to:: Private residence Living Arrangements: Alone Available Help at Discharge: Family;Friend(s);Available PRN/intermittently Type of Home: House Home Access: Stairs to enter Entrance Stairs-Rails: Can reach both Entrance Stairs-Number of Steps: 5   Home Layout: One level Home Equipment: Rollator (4 wheels);Cane - single point;Shower seat;Rolling Walker (2 wheels) Additional Comments: reports use of O2 at night as  needed, however after teh last hopitalization they stated he didn't need oxygen anymore.    Prior Function Prior Level of Function : Independent/Modified Independent;Driving             Mobility Comments: reports using cane mostly ADLs Comments: did his own cooking cleaning and self care. He has a caregiver who comes in the morning and at night to check on pateint with  meds and food .     Hand Dominance   Dominant Hand: Right    Extremity/Trunk Assessment        Lower Extremity Assessment Lower Extremity Assessment: Overall WFL for tasks assessed       Communication   Communication: HOH  Cognition Arousal/Alertness: Awake/alert Behavior During Therapy: WFL for tasks assessed/performed Overall Cognitive Status: Within Functional Limits for tasks assessed                                          General Comments      Exercises     Assessment/Plan    PT Assessment Patient needs continued PT services  PT Problem List Decreased strength;Decreased activity tolerance;Decreased mobility       PT Treatment Interventions Gait training;Functional mobility training;Therapeutic activities;Therapeutic exercise;Patient/family education    PT Goals (Current goals can be found in the Care Plan section)  Acute Rehab PT Goals Patient Stated Goal: I want to go home and I want to use my cane not the rolling walker PT Goal Formulation: With patient/family Time For Goal Achievement: 08/15/22 Potential to Achieve Goals: Good    Frequency Min 3X/week     Co-evaluation               AM-PAC PT "6 Clicks" Mobility  Outcome Measure Help needed turning from your back to your side while in a flat bed without using bedrails?: None Help needed moving from lying on your back to sitting on the side of a flat bed without using bedrails?: None Help needed moving to and from a bed to a chair (including a wheelchair)?: A Little Help needed standing up from a chair using your arms (e.g., wheelchair or bedside chair)?: A Little Help needed to walk in hospital room?: A Little Help needed climbing 3-5 steps with a railing? : A Little 6 Click Score: 20    End of Session Equipment Utilized During Treatment: Gait belt Activity Tolerance: Patient tolerated treatment well Patient left: in chair;with call bell/phone within reach;with  family/visitor present Nurse Communication: Mobility status PT Visit Diagnosis: Other abnormalities of gait and mobility (R26.89)    Time: 1600-1630 PT Time Calculation (min) (ACUTE ONLY): 30 min   Charges:   PT Evaluation $PT Eval Low Complexity: 1 Low PT Treatments $Gait Training: 8-22 mins        Gatha Mayer, PT, MPT Acute Rehabilitation Services Office: [phone] If a weekend: [facility] w/e pager [phone] 08/01/2022   Clide Dales 08/01/2022, 4:55 PM

## 2022-08-02 DIAGNOSIS — J690 Pneumonitis due to inhalation of food and vomit: Secondary | ICD-10-CM | POA: Diagnosis not present

## 2022-08-02 LAB — BASIC METABOLIC PANEL
Anion gap: 8 (ref 5–15)
BUN: 31 mg/dL — ABNORMAL HIGH (ref 8–23)
CO2: 28 mmol/L (ref 22–32)
Calcium: 8.7 mg/dL — ABNORMAL LOW (ref 8.9–10.3)
Chloride: 100 mmol/L (ref 98–111)
Creatinine, Ser: 1.88 mg/dL — ABNORMAL HIGH (ref 0.61–1.24)
GFR, Estimated: 35 mL/min — ABNORMAL LOW (ref >60)
Glucose, Bld: 109 mg/dL — ABNORMAL HIGH (ref 70–99)
Potassium: 3.5 mmol/L (ref 3.5–5.1)
Sodium: 136 mmol/L (ref 135–145)

## 2022-08-02 LAB — CBC WITH DIFFERENTIAL/PLATELET
Abs Immature Granulocytes: 0.09 10*3/uL — ABNORMAL HIGH (ref 0.00–0.07)
Basophils Absolute: 0 10*3/uL (ref 0.0–0.1)
Basophils Relative: 0 %
Eosinophils Absolute: 0.1 10*3/uL (ref 0.0–0.5)
Eosinophils Relative: 2 %
HCT: 30.3 % — ABNORMAL LOW (ref 39.0–52.0)
Hemoglobin: 9.7 g/dL — ABNORMAL LOW (ref 13.0–17.0)
Immature Granulocytes: 1 %
Lymphocytes Relative: 21 %
Lymphs Abs: 1.5 10*3/uL (ref 0.7–4.0)
MCH: 29.4 pg (ref 26.0–34.0)
MCHC: 32 g/dL (ref 30.0–36.0)
MCV: 91.8 fL (ref 80.0–100.0)
Monocytes Absolute: 0.7 10*3/uL (ref 0.1–1.0)
Monocytes Relative: 9 %
Neutro Abs: 4.8 10*3/uL (ref 1.7–7.7)
Neutrophils Relative %: 67 %
Platelets: 267 10*3/uL (ref 150–400)
RBC: 3.3 MIL/uL — ABNORMAL LOW (ref 4.22–5.81)
RDW: 15.1 % (ref 11.5–15.5)
WBC: 7.1 10*3/uL (ref 4.0–10.5)
nRBC: 0 % (ref 0.0–0.2)

## 2022-08-02 MED ORDER — AMOXICILLIN-POT CLAVULANATE 875-125 MG PO TABS: 1.0000 | ORAL_TABLET | Freq: Two times a day (BID) | ORAL | 0 refills | Status: AC

## 2022-08-02 MED ORDER — HYDROCODONE-ACETAMINOPHEN 5-325 MG PO TABS
1.0000 | ORAL_TABLET | Freq: Once | ORAL | Status: AC
  Administered 2022-08-02: 1 via ORAL
  Filled 2022-08-02: qty 1

## 2022-08-02 MED ORDER — ACETAMINOPHEN 325 MG PO TABS
650.0000 mg | ORAL_TABLET | Freq: Four times a day (QID) | ORAL | Status: DC | PRN
  Administered 2022-08-02: 650 mg via ORAL
  Filled 2022-08-02: qty 2

## 2022-08-02 MED ORDER — LIDOCAINE 5 % EX PTCH
1.0000 | MEDICATED_PATCH | CUTANEOUS | Status: AC
  Administered 2022-08-02: 1 via TRANSDERMAL
  Filled 2022-08-02: qty 1

## 2022-08-02 MED ORDER — GUAIFENESIN-DM 100-10 MG/5ML PO SYRP: 5.0000 mL | ORAL_SOLUTION | Freq: Four times a day (QID) | ORAL | 0 refills | Status: DC | PRN

## 2022-08-02 MED ORDER — SACCHAROMYCES BOULARDII 250 MG PO CAPS: 250.0000 mg | ORAL_CAPSULE | Freq: Two times a day (BID) | ORAL | 0 refills | Status: AC

## 2022-08-02 NOTE — TOC Initial Note (Addendum)
Transition of Care [facility]) - Initial/Assessment Note    Patient Details  Name: Harry Norris MRN: [id number] Date of Birth: [birth year]  Transition of Care Harry Norris) CM/SW Contact:    Harry Dine, RN Phone Number: 08/02/2022, 2:15 PM  Clinical Narrative:                 [facility] consult for HHPT; spoke w/ pt in room; the pt is from  home and says he plans to return at d/c; he says he has transportation home; the pt says he has glasses, wheelchair, walker and cane; he says he has upper and lower dentures; the pt says he has an oxygen concentrator at home that he bought for his wife; he says he can not remember who he purchased it from; the pt says he only uses it as need and  [place], RN says the pt's son will bring a travel tank; the pt says his POC is son Harry Norris [phone]); the pt agrees to HHPT and does not have an agency preference; spoke with Malachy Mood at [facility] and the agency can service the pt; pt notified and agency contact information placed in follow up provider section of d/c instructions; LVM for pt's son Harry Norris for name of company that provides oxygen; awaiting return call; no TOC needs.        Patient Goals and CMS Choice        Expected Discharge Plan and Services           Expected Discharge Date: 08/02/22                                    Prior Living Arrangements/Services                       Activities of Daily Living      Permission Sought/Granted                  Emotional Assessment              Admission diagnosis:  Aspiration pneumonia ([place]) [J69.0] Aspiration pneumonia of both lungs, unspecified aspiration pneumonia type, unspecified part of lung ([place]) [J69.0] Patient Active Problem List   Diagnosis Date Noted   Aspiration pneumonia ([place]) 07/31/2022   Right upper lobe pulmonary nodule 07/31/2022   AKI (acute kidney injury) ([place]) 07/31/2022   Essential hypertension 03/19/2022   Anemia 03/18/2022   Benign neoplasm  of colon 03/18/2022   Bronchitis 03/18/2022   Cancer ([place]) 03/18/2022   Hypertension 03/18/2022   Kidney stone 03/18/2022   Diarrhea    Diverticulitis    Blood in the stool    Renal insufficiency 09/26/2021   Pressure injury of skin 07/30/2021   Respiratory failure ([place]) 07/29/2021   Influenza 07/29/2021   Acute hypoxemic respiratory failure ([place]) 07/12/2021   CKD (chronic kidney disease), stage III ([place]) 07/12/2021   Community acquired pneumonia of right lower lobe of lung 07/12/2021   Sepsis ([place]) 07/12/2021   Chronic obstructive pulmonary disease with acute exacerbation ([place]) 07/12/2021   Bradycardia 12/19/2018   Orthostatic hypotension 12/19/2018   Fatigue 12/12/2018   Hyperlipidemia 02/07/2018   Asthma 02/07/2018   Chronic rhinitis 02/26/2016   Cardiomyopathy ([place]) 12/18/2015   Chronic venous insufficiency 12/18/2015   LBBB (left bundle branch block) 12/18/2015   Syncope 12/18/2015   Hypoxemia 04/10/2014   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   Hypertensive  heart disease    Prostate cancer ([place])    AAA (abdominal aortic aneurysm) ([place])    Carotid artery occlusion    DM type 2 (diabetes mellitus, type 2) ([place])    Leg swelling 11/28/2013   Other symptoms involving cardiovascular system 11/28/2013   Varicose veins of lower extremities with other complications 38/46/6599   PCP:  Harry Dress, MD Pharmacy:   [id number] [id number]: [phone]: [phone]    Social Determinants of Health (SDOH) Interventions    Readmission Risk Interventions    08/02/2022    2:14 PM 09/29/2021    5:07 PM  Readmission Risk Prevention Plan  Transportation Screening Complete Complete  PCP or Specialist Appt within 3-5 Days Complete   HRI or Home Care Consult Complete Not Complete  HRI or Home Care Consult comments  Pending son's decision about 24 hr homecare v/s ALF   Social Work Consult for RJardinePlanning/Counseling Complete Not Complete  SW consult not completed comments  Pending son's decision about 24 hr homecare v/s ALF  Palliative Care Screening Not Applicable Not Applicable  Medication Review (RN Care Manager) Complete Complete

## 2022-08-02 NOTE — Discharge Summary (Signed)
Physician Discharge Summary  Harry Norris EQA:[id number] DOB: Sep 01, 1935 DOA: 07/31/2022  PCP: [name], MD  Admit date: 07/31/2022 Discharge date: 08/02/2022  Admitted From: Home Discharge disposition: Home with home health PT  Recommendations at discharge:  Complete the course of antibiotics with 5 more days of Augmentin with probiotics.   Brief narrative: Harry Norris is a 86 y.o. male with PMH significant for DM2, HTN, HLD, CAD, chronic systolic CHF (EF 25 to 97% 07/30/2021), COPD on 2 L oxygen at home, CKD, carotid artery occlusion, AAA, prostate cancer, chronic anemia, varicose vein. Patient usually has a dry cough.  11/25, he started to feel more short of breath.  11/27, went to urgent care and was diagnosed with flu, started on Tamiflu despite which his symptoms have progressed.  Also reports having fever and reduced oral intake. 12/1, patient presented to the ED with worsening symptoms.  In the ED, patient was afebrile, hemodynamically stable, required 2 L oxygen by nasal cannula Labs with WBC count 7.4, hemoglobin 10.6 BUN/creatinine elevated to 29/1.98, BNP 846, troponin 28>33. Respiratory virus panel negative for flu, COVID Chest x-ray showed worsened right-greater-than-left bibasilar consolidations, concerning for infection or aspiration. CTPA negative for PE but showed bibasilar airspace disease consistent with aspiration pneumonia worse on the right with small right-sided effusion.  Subjective: Patient was seen and examined this morning.   Sitting up at the edge of the bed.  Not in distress.  On 2 L oxygen.  Able to ambulate on the same.  Feels better.  Wants to go home.    Hospital course: Bibasilar pneumonia Suspect aspiration Recent influenza COPD exacerbation Diagnosis influenza on 11/27, completed course of Tamiflu respiratory symptoms persisted and hence presented to ED Imaging as above showing bibasilar pneumonia, likely aspiration Currently  improving on IV Unasyn.  Switch to oral Augmentin 875 mg twice daily for next 5 days with probiotics. MBS was done.  Only mild risk of aspiration identified Continue bronchodilators, Mucinex, incentive spirometry Recent Labs  Lab 07/31/22 1230 08/02/22 0443  WBC 7.4 7.1   Chronic respiratory failure with hypoxia Chronically on 2L oxygen at home.  Able to ambulate on the scene.  Chronic systolic CHF Cardiomyopathy Essential hypertension Last echo 07/30/2021 with EF 25 to 30% PTA on Lasix 40 mg daily, losartan 100 mg daily. Continue both.  Remains euvolemic.   CKD 3B Baseline creatinine 1.79, mildly elevated to 1.98 at on admission.  Currently at baseline.   Recent Labs    08/03/21 0712 09/26/21 1125 09/27/21 0146 09/28/21 0254 09/29/21 0247 03/27/22 1437 07/31/22 1230 08/01/22 0500 08/02/22 0443  BUN 28* 86* 81*  80* 68* 58* 27* 29* 27* 31*  CREATININE 0.90 5.89* 5.04*  5.06* 3.82* 3.28* 1.79* 1.98* 1.61* 1.88*   CAD/PAD/HLD PTA on aspirin 81 mg daily, Lipitor 40 mg daily, fenofibrate 160 mg daily Continue all  Type 2 diabetes mellitus A1c 5.8 in January 2023 Diet controlled.  Right upper lobe pulmonary nodule Right upper lobe nodularity measuring 7 x 6 mm seen incidentally on CTA chest.  Recommend 8 to 12 weeks follow-up after resolution of acute symptoms.  Wounds:  - Pressure Injury 07/29/21 Buttocks Right Deep Tissue Pressure Injury - Purple or maroon localized area of discolored intact skin or blood-filled blister due to damage of underlying soft tissue from pressure and/or shear. (Active)  Date First Assessed/Time First Assessed: 07/29/21 1530   Location: Buttocks  Location Orientation: Right  Staging: Deep Tissue Pressure Injury - Purple or maroon  localized area of discolored intact skin or blood-filled blister due to damage of underly...    Assessments 07/29/2021  3:30 PM 08/03/2021  9:43 AM  Dressing Type Foam - Lift dressing to assess site every shift Foam  - Lift dressing to assess site every shift  Dressing -- Clean;Dry;Intact  Dressing Change Frequency Daily Every 3 days  State of Healing Non-healing --  Site / Wound Assessment Black;Purple;Painful Red  Drainage Amount -- None     No associated orders.     Pressure Injury 07/29/21 Buttocks Left Stage 2 -  Partial thickness loss of dermis presenting as a shallow open injury with a red, pink wound bed without slough. (Active)  Date First Assessed/Time First Assessed: 07/29/21 1530   Location: Buttocks  Location Orientation: Left  Staging: Stage 2 -  Partial thickness loss of dermis presenting as a shallow open injury with a red, pink wound bed without slough.  Present on Ad...    Assessments 07/29/2021  3:30 PM 08/03/2021  9:43 AM  Dressing Type Foam - Lift dressing to assess site every shift Foam - Lift dressing to assess site every shift  Dressing -- Clean;Dry;Intact  Dressing Change Frequency Daily PRN  State of Healing Early/partial granulation --  Site / Wound Assessment Clean;Dry --     No associated orders.     Pressure Injury 09/26/21 Sacrum Right Stage 2 -  Partial thickness loss of dermis presenting as a shallow open injury with a red, pink wound bed without slough. (Active)  Date First Assessed/Time First Assessed: 09/26/21 2130   Location: Sacrum  Location Orientation: Right  Staging: Stage 2 -  Partial thickness loss of dermis presenting as a shallow open injury with a red, pink wound bed without slough.  Present on Adm...    Assessments 09/26/2021  9:02 PM 09/29/2021  9:00 AM  Dressing Type Foam - Lift dressing to assess site every shift Foam - Lift dressing to assess site every shift  Dressing Clean;Dry;Intact --  Site / Wound Assessment Pink;Clean;Dry --  % Wound base Red or Granulating -- 100%  Wound Length (cm) -- 0.5 cm  Wound Width (cm) -- 0.5 cm  Wound Depth (cm) -- 0.1 cm  Wound Surface Area (cm^2) -- 0.25 cm^2  Wound Volume (cm^3) -- 0.03 cm^3  Drainage Amount None  --     No associated orders.     Pressure Injury 09/26/21 Buttocks Left Deep Tissue Pressure Injury - Purple or maroon localized area of discolored intact skin or blood-filled blister due to damage of underlying soft tissue from pressure and/or shear. (Active)  Date First Assessed/Time First Assessed: 09/26/21 2130   Location: Buttocks  Location Orientation: Left  Staging: Deep Tissue Pressure Injury - Purple or maroon localized area of discolored intact skin or blood-filled blister due to damage of underlyi...    Assessments 09/26/2021  9:02 PM 09/29/2021  9:00 AM  Dressing Type Foam - Lift dressing to assess site every shift Foam - Lift dressing to assess site every shift  Dressing Clean;Dry;Intact --  Site / Wound Assessment Purple --  Wound Length (cm) -- 1 cm  Wound Width (cm) -- 1 cm  Wound Surface Area (cm^2) -- 1 cm^2  Margins Attached edges (approximated) --  Drainage Amount None --  Treatment Cleansed --     No associated orders.     Pressure Injury 09/29/21 Buttocks Right Stage 2 -  Partial thickness loss of dermis presenting as a shallow open injury with a red,  pink wound bed without slough. (Active)  Date First Assessed: 09/29/21   Location: Buttocks  Location Orientation: Right  Staging: Stage 2 -  Partial thickness loss of dermis presenting as a shallow open injury with a red, pink wound bed without slough.  Present on Admission: Yes    Assessments 09/29/2021  9:00 AM  % Wound base Red or Granulating 100%  Wound Length (cm) 0.5 cm  Wound Width (cm) 0.5 cm  Wound Depth (cm) 0.1 cm  Wound Surface Area (cm^2) 0.25 cm^2  Wound Volume (cm^3) 0.03 cm^3     No associated orders.     Incision (Closed) 03/27/22 Eye Other (Comment) (Active)  Date First Assessed/Time First Assessed: 03/27/22 1719   Location: Eye  Location Orientation: Other (Comment)    Assessments 03/27/2022  5:30 PM 03/27/2022  5:45 PM  Dressing Type None None  Dressing Clean, Dry, Intact Clean, Dry, Intact   Site / Wound Assessment Clean;Dry Clean;Dry  Margins Attached edges (approximated) Attached edges (approximated)  Drainage Amount None None     No associated orders.     Pressure Injury 07/31/22 Coccyx Medial Stage 2 -  Partial thickness loss of dermis presenting as a shallow open injury with a red, pink wound bed without slough. (Active)  Date First Assessed/Time First Assessed: 07/31/22 1930   Location: Coccyx  Location Orientation: Medial  Staging: Stage 2 -  Partial thickness loss of dermis presenting as a shallow open injury with a red, pink wound bed without slough.  Present on Ad...    Assessments 07/31/2022  7:30 PM 08/01/2022  9:00 PM  Dressing Type Foam - Lift dressing to assess site every shift Foam - Lift dressing to assess site every shift  Dressing Clean, Dry, Intact Clean, Dry, Intact  Dressing Change Frequency PRN PRN  Peri-wound Assessment Pink --  Margins Unattached edges (unapproximated) --  Drainage Amount Scant --  Drainage Description Serosanguineous --  Treatment Cleansed --     No associated orders.    Discharge Exam:   Vitals:   08/02/22 0057 08/02/22 0438 08/02/22 0816 08/02/22 1016  BP: 114/89 (!) 109/44  (!) 115/57  Pulse: 72 71  80  Resp: '16 16  16  '$ Temp: 97.7 F (36.5 C) 97.8 F (36.6 C)  98.2 F (36.8 C)  TempSrc: Oral Oral  Oral  SpO2: 99% 93% 92% 100%  Weight:      Height:        Body mass index is 23.1 kg/m.  General exam: Pleasant, elderly Caucasian male.  Not in physical distress Skin: No rashes, lesions or ulcers. HEENT: Atraumatic, normocephalic, no obvious bleeding Lungs: Diminished air entry in both bases.  No crackles or wheezing CVS: Regular rate and rhythm, no murmur GI/Abd soft, nontender, nondistended, abdominal CNS: Alert, awake, oriented x 3 Psychiatry: Mood appropriate Extremities: No pedal edema, no calf redness  Follow ups:    Follow-up Information     [name], MD Follow up.   Specialty: Internal  Medicine Contact information: [employer] [place] [id number] [phone]                 Discharge Instructions:   Discharge Instructions     Call MD for:  difficulty breathing, headache or visual disturbances   Complete by: As directed    Call MD for:  extreme fatigue   Complete by: As directed    Call MD for:  hives   Complete by: As directed  Call MD for:  persistant dizziness or light-headedness   Complete by: As directed    Call MD for:  persistant nausea and vomiting   Complete by: As directed    Call MD for:  severe uncontrolled pain   Complete by: As directed    Call MD for:  temperature >100.4   Complete by: As directed    Diet general   Complete by: As directed    Discharge instructions   Complete by: As directed    Recommendations at discharge:   Complete the course of antibiotics with 5 more days of Augmentin with probiotics.  General discharge instructions: Follow with Primary MD [name], MD in 7 days  Please request your PCP  to go over your hospital tests, procedures, radiology results at the follow up. Please get your medicines reviewed and adjusted.  Your PCP may decide to repeat certain labs or tests as needed. Do not drive, operate heavy machinery, perform activities at heights, swimming or participation in water activities or provide baby sitting services if your were admitted for syncope or siezures until you have seen by Primary MD or a Neurologist and advised to do so again. [place] Controlled Substance Reporting System database was reviewed. Do not drive, operate heavy machinery, perform activities at heights, swim, participate in water activities or provide baby-sitting services while on medications for pain, sleep and mood until your outpatient physician has reevaluated you and advised to do so again.  You are strongly recommended to comply with the dose, frequency and duration of prescribed  medications. Activity: As tolerated with Full fall precautions use walker/cane & assistance as needed Avoid using any recreational substances like cigarette, tobacco, alcohol, or non-prescribed drug. If you experience worsening of your admission symptoms, develop shortness of breath, life threatening emergency, suicidal or homicidal thoughts you must seek medical attention immediately by calling 911 or calling your MD immediately  if symptoms less severe. You must read complete instructions/literature along with all the possible adverse reactions/side effects for all the medicines you take and that have been prescribed to you. Take any new medicine only after you have completely understood and accepted all the possible adverse reactions/side effects.  Wear Seat belts while driving. You were cared for by a hospitalist during your hospital stay. If you have any questions about your discharge medications or the care you received while you were in the hospital after you are discharged, you can call the unit and ask to speak with the hospitalist or the covering physician. Once you are discharged, your primary care physician will handle any further medical issues. Please note that NO REFILLS for any discharge medications will be authorized once you are discharged, as it is imperative that you return to your primary care physician (or establish a relationship with a primary care physician if you do not have one).   Discharge wound care:   Complete by: As directed    Increase activity slowly   Complete by: As directed        Discharge Medications:   Allergies as of 08/02/2022       Reactions   Ace Inhibitors Cough        Medication List     STOP taking these medications    erythromycin ophthalmic ointment   oseltamivir 75 MG capsule Commonly known as: TAMIFLU   pantoprazole 40 MG tablet Commonly known as: PROTONIX       TAKE these medications    albuterol 108 (90 Base)  MCG/ACT  inhaler Commonly known as: VENTOLIN HFA Inhale 2 puffs into the lungs every 6 (six) hours as needed for wheezing or shortness of breath.   amoxicillin-clavulanate 875-125 MG tablet Commonly known as: AUGMENTIN Take 1 tablet by mouth 2 (two) times daily for 5 days.   ASCORBIC ACID PO Take 1 tablet by mouth in the morning. Vitamin C, unknown strength   aspirin EC 81 MG tablet Take 81 mg by mouth in the morning.   atorvastatin 40 MG tablet Commonly known as: LIPITOR Take 40 mg by mouth in the morning.   Centrum Silver 50+Men Tabs Take 1 tablet by mouth in the morning.   fenofibrate 160 MG tablet Take 160 mg by mouth in the morning.   FISH OIL PO Take 1 capsule by mouth in the morning.   Flovent HFA 110 MCG/ACT inhaler Generic drug: fluticasone Inhale 2 puffs into the lungs 2 (two) times daily as needed (Shortness of breath).   furosemide 40 MG tablet Commonly known as: LASIX Take 40 mg by mouth in the morning.   guaiFENesin-dextromethorphan 100-10 MG/5ML syrup Commonly known as: ROBITUSSIN DM Take 5 mLs by mouth every 6 (six) hours as needed for cough.   hydrOXYzine 25 MG tablet Commonly known as: ATARAX Take 25 mg by mouth at bedtime.   losartan 100 MG tablet Commonly known as: COZAAR Take 100 mg by mouth in the morning.   MELATONIN PO Take 1 tablet by mouth at bedtime.   OXYGEN Inhale 2 L/min into the lungs daily as needed (for shortness of breath).   saccharomyces boulardii 250 MG capsule Commonly known as: FLORASTOR Take 1 capsule (250 mg total) by mouth 2 (two) times daily for 5 days.               Discharge Care Instructions  (From admission, onward)           Start     Ordered   08/02/22 0000  Discharge wound care:        08/02/22 1140             The results of significant diagnostics from this hospitalization (including imaging, microbiology, ancillary and laboratory) are listed below for reference.    Procedures and  Diagnostic Studies:   DG Swallowing Func-Speech Pathology  Result Date: 08/01/2022 Table formatting from the original result was not included. Images from the original result were not included. Objective Swallowing Evaluation: Type of Study: Bedside Swallow Evaluation  Patient Details Name: ROARK RUFO MRN: [id number] Date of Birth: Jan 01, 1936 Today's Date: 08/01/2022 Time: SLP Start Time (ACUTE ONLY): 65 -SLP Stop Time (ACUTE ONLY): 1125 SLP Time Calculation (min) (ACUTE ONLY): 20 min Past Medical History: Past Medical History: Diagnosis Date  AAA (abdominal aortic aneurysm) ([place])   Acute hypoxemic respiratory failure ([place]) 07/12/2021  Anemia   Asthma 02/07/2018  Benign neoplasm of colon   Blood in the stool   Bradycardia 12/19/2018  Bronchitis   Cancer ([place])   prostate  Cardiomyopathy ([place]) 12/18/2015  Carotid artery occlusion   Chronic obstructive pulmonary disease with acute exacerbation ([place]) 07/12/2021  Chronic rhinitis 02/26/2016  Chronic venous insufficiency 12/18/2015  CKD (chronic kidney disease), stage III ([place]) 07/12/2021  Community acquired pneumonia of right lower lobe of lung 07/12/2021  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  PFTs 01/18/14:  FeV1 1.58 45% FVC 2.0 50% FeV1/FVC 79% Fef 25-75 795 ( 40% improved after BD) CXR: Copd changes ONO 03/07/14 desats to <88% on RA  Diarrhea   Diverticulitis   DM type 2 (diabetes mellitus, type 2) ([place])   patient denies every being diagnosed with diabetes  Fatigue 12/12/2018  Hyperlipidemia   Hypertension   Hypertensive heart disease   Hypoxemia 04/10/2014  Overview:  Last Assessment & Plan:  Cont nocturnal oxygen therapy 2L   Influenza 07/29/2021  Kidney stone   LBBB (left bundle branch block) 12/18/2015  Leg swelling 11/28/2013  Orthostatic hypotension 12/19/2018  Other symptoms involving cardiovascular system 11/28/2013  Pressure injury of skin 07/30/2021  Prostate cancer ([place])   prostate   Renal insufficiency 09/26/2021  Respiratory failure ([place])  07/29/2021  Sepsis ([place]) 07/12/2021  Syncope 12/18/2015  Varicose veins of lower extremities with other complications 10/62/6948 Past Surgical History: Past Surgical History: Procedure Laterality Date   cardiac catheterization  12/29/2009  CATARACT EXTRACTION Bilateral   ECTROPION REPAIR Bilateral 03/27/2022  Procedure: REPAIR OF ECTROPION;  Surgeon: Delia Chimes, MD;  Location: [facility];  Service: Ophthalmology;  Laterality: Bilateral;  ENDOVENOUS ABLATION SAPHENOUS VEIN W/ LASER Right 03/29/2014  EVLA right greater saphenous vein    gold seed implants  08/31/2009  treatment of prostate cancer  HERNIA REPAIR Bilateral   LITHOTRIPSY  08/31/2008  PROSTATE SURGERY    pt reports he "think they removed" his prostate  right ear surgery   HPI: Patient is an 86 y.o. male with PMH: COPD, cardiomyopathy, HTN, asthma, DM-2, CKD III, HLD, peripheral carotid disease. he presented to the hospital on 07/31/2022 with increasing SOB, cough and found to have aspiration PNA. Flu and Covid PCR negative.  Subjective: pleasant, continues with persistent coughing  Recommendations for follow up therapy are one component of a multi-disciplinary discharge planning process, led by the attending physician.  Recommendations may be updated based on patient status, additional functional criteria and insurance authorization. Assessment / Plan / Recommendation   08/01/2022  12:06 PM Clinical Impressions Clinical Impression Patient presents with a mild pharyngoesophageal dysphagia as per this MBS. Barium consistencies tested were: nectar thick, thin, puree/pudding, mechanical soft, 68m barium tablet. He exhibited flash penetration (PAS 2) fairly consistently with thin liquids and exhibited a couple instances of penetration that did not immediately clear (PAS 3). Patient did seem to exhibit sensation to penetrate and cough/throat clear was effective to clear penetrate majority of the time, however patient also with persistent coughing which was  [facility] and so difficult to differentiate. Penetrate was very trace and no instances of aspiration before, during or after swallow were observed during this MBS. Patient exhibited trace to mild vallecular sinus and pyriform sinus residuals with thin liquids, trace vallecular residuals with nectar thick liquids and mild vallecular residuals with mechanical soft solids. 196mbarium tablet transited pharyngeally and through esophagus without observed difficulty or delay. Prominent cricopharyngeal bar observed which did appear to at least somewhat reduce bolus transit. Overall, suspect that patient's swallow function is baseline and aspiration risk is mild. SLP will f/u one time to ensure diet toleration and complete education/recommendations. SLP Visit Diagnosis Dysphagia, pharyngoesophageal phase (R13.14) Impact on safety and function Mild aspiration risk     08/01/2022  12:06 PM Treatment Recommendations Treatment Recommendations Therapy as outlined in treatment plan below     08/01/2022  12:14 PM Prognosis Prognosis for Safe Diet Advancement Good   08/01/2022  12:06 PM Diet Recommendations SLP Diet Recommendations Regular solids;Dysphagia 3 (Mech soft) solids;Thin liquid Liquid Administration via Cup;Straw Medication Administration Whole meds with liquid Compensations Slow rate;Small sips/bites;Clear throat intermittently Postural Changes Seated  upright at 90 degrees     08/01/2022  12:06 PM Other Recommendations Oral Care Recommendations Oral care BID Follow Up Recommendations No SLP follow up Functional Status Assessment Patient has had a recent decline in their functional status and demonstrates the ability to make significant improvements in function in a reasonable and predictable amount of time.   08/01/2022  12:06 PM Frequency and Duration  Speech Therapy Frequency (ACUTE ONLY) min 1 x/week Treatment Duration 1 week     08/01/2022  12:00 PM Oral Phase Oral Phase [facility]    08/01/2022  12:00 PM  Pharyngeal Phase Pharyngeal Phase Impaired Pharyngeal- Nectar Cup Pharyngeal residue - valleculae Pharyngeal- Thin Cup Pharyngeal residue - valleculae;Pharyngeal residue - pyriform;Penetration/Aspiration during swallow;Reduced tongue base retraction Pharyngeal Material enters airway, remains ABOVE vocal cords and not ejected out;Material enters airway, remains ABOVE vocal cords then ejected out Pharyngeal- Thin Straw Penetration/Aspiration during swallow;Pharyngeal residue - valleculae;Pharyngeal residue - pyriform;Reduced tongue base retraction Pharyngeal Material enters airway, remains ABOVE vocal cords and not ejected out;Material enters airway, remains ABOVE vocal cords then ejected out Pharyngeal- Puree Pharyngeal residue - valleculae Pharyngeal- Mechanical Soft Pharyngeal residue - valleculae Pharyngeal- Pill [facility]    08/01/2022  12:05 PM Cervical Esophageal Phase  Cervical Esophageal Phase Impaired Cervical Esophageal Comment presence of cricopharyngeal bar which did appear to impact bolus transit to some degree but no barium stasis during cervical esophageal phase Sonia Baller, MA, CCC-SLP Speech Therapy                     CT Angio Chest PE W/Cm &/Or Wo Cm  Result Date: 07/31/2022 CLINICAL DATA:  Pulmonary embolism suspected in a male at age 24. History of prostate cancer. * Tracking Code: BO * EXAM: CT ANGIOGRAPHY CHEST WITH CONTRAST TECHNIQUE: Multidetector CT imaging of the chest was performed using the standard protocol during bolus administration of intravenous contrast. Multiplanar CT image reconstructions and MIPs were obtained to evaluate the vascular anatomy. RADIATION DOSE REDUCTION: This exam was performed according to the departmental dose-optimization program which includes automated exposure control, adjustment of the mA and/or kV according to patient size and/or use of iterative reconstruction technique. CONTRAST:  65m OMNIPAQUE IOHEXOL 350 MG/ML SOLN COMPARISON:  July 29, 2021  FINDINGS: Cardiovascular: Calcified aortic atherosclerotic changes and noncalcified plaque throughout the thoracic aorta, similar to previous imaging. No aortic dilation. No acute aortic process with three-vessel branching pattern in the chest mild-to-moderate cardiomegaly. No pericardial effusion or signs of pericardial nodularity. Main pulmonary artery is opacified to 4 and 76 Hounsfield units. The study is negative for pulmonary embolism. Mediastinum/Nodes: Scattered small lymph nodes throughout the mediastinum are similar to prior imaging the exception of a RIGHT paratracheal lymph node which measures approximately 14-15 mm as compared to 12 mm. Beam hardening artifact does however limit assessment in this area no signs of axillary or thoracic inlet lymphadenopathy. Mild fullness of RIGHT hilar nodal tissue is unchanged. Esophagus is mildly patulous. Lungs/Pleura: Dense consolidative changes in the dependent RIGHT lower lobe. Less pronounced consolidative changes in the dependent LEFT lower lobe. Small RIGHT-sided pleural effusion. No frank material in larger bronchi in the chest a based on distribution aspiration is a concern. Loss of peripheral air bronchograms in the RIGHT lower lobe and the nodular and tree in bud appearance of airspace process and more aerated portions in the RIGHT lower lobe further suggestive of this possibility. New area of nodularity just above the major fissure in the posterior RIGHT upper lobe 7  x 6 mm (image 31/5. This is nonspecific in the context of active inflammatory changes. Upper Abdomen: Incidental imaging of upper abdominal contents without acute process. Musculoskeletal: No acute bone finding. No destructive bone process. Spinal degenerative changes. Review of the MIP images confirms the above findings. IMPRESSION: 1. Study is negative for pulmonary embolism. 2. Bibasilar airspace disease with pattern that suggests aspiration related pneumonia. Associated on the RIGHT with  small RIGHT-sided pleural effusion. 3. New area of nodularity just above the major fissure in the posterior RIGHT upper lobe 7 x 6 mm. This is nonspecific in the context of active inflammatory changes. Suggest follow-up in 8-12 weeks following resolution of acute symptoms. 4. RIGHT paratracheal lymph node slightly increased in size from 12-15 mm as compared to 12 mm. Also nonspecific and likely reactive. 5. Aortic atherosclerosis. Aortic Atherosclerosis (ICD10-I70.0). Electronically Signed   By: Zetta Bills M.D.   On: 07/31/2022 15:08   DG Chest 2 View  Result Date: 07/31/2022 CLINICAL DATA:  Cough EXAM: CHEST - 2 VIEW COMPARISON:  Chest x-ray dated September 26, 2021 FINDINGS: Cardiac and mediastinal contours within normal limits. Right-greater-than-left bibasilar consolidations, worsened when compared prior chest radiograph. Possible small right pleural effusion. No evidence of pneumothorax. IMPRESSION: 1. Worsened right-greater-than-left bibasilar consolidations, concerning for infection or aspiration. Recommend follow-up PA and lateral chest x-ray 6-8 weeks to ensure resolution 2. Possible new small right pleural effusion. Electronically Signed   By: Yetta Glassman M.D.   On: 07/31/2022 12:15     Labs:   Basic Metabolic Panel: Recent Labs  Lab 07/31/22 1230 07/31/22 1631 08/01/22 0500 08/02/22 0443  NA 135  --  135 136  K 3.9  --  4.1 3.5  CL 95*  --  99 100  CO2 30  --  28 28  GLUCOSE 121*  --  102* 109*  BUN 29*  --  27* 31*  CREATININE 1.98*  --  1.61* 1.88*  CALCIUM 8.8*  --  8.6* 8.7*  MG  --  1.9  --   --    GFR Estimated Creatinine Clearance: 30.2 mL/min (A) (by C-G formula based on SCr of 1.88 mg/dL (H)). Liver Function Tests: Recent Labs  Lab 07/31/22 1230  AST 21  ALT 13  ALKPHOS 41  BILITOT 0.9  PROT 6.7  ALBUMIN 3.0*   No results for input(s): "LIPASE", "AMYLASE" in the last 168 hours. No results for input(s): "AMMONIA" in the last 168 hours. Coagulation  profile No results for input(s): "INR", "PROTIME" in the last 168 hours.  CBC: Recent Labs  Lab 07/31/22 1230 08/02/22 0443  WBC 7.4 7.1  NEUTROABS 5.3 4.8  HGB 10.6* 9.7*  HCT 32.9* 30.3*  MCV 91.9 91.8  PLT 248 267   Cardiac Enzymes: No results for input(s): "CKTOTAL", "CKMB", "CKMBINDEX", "TROPONINI" in the last 168 hours. BNP: Invalid input(s): "POCBNP" CBG: No results for input(s): "GLUCAP" in the last 168 hours. D-Dimer No results for input(s): "DDIMER" in the last 72 hours. Hgb A1c No results for input(s): "HGBA1C" in the last 72 hours. Lipid Profile No results for input(s): "CHOL", "HDL", "LDLCALC", "TRIG", "CHOLHDL", "LDLDIRECT" in the last 72 hours. Thyroid function studies No results for input(s): "TSH", "T4TOTAL", "T3FREE", "THYROIDAB" in the last 72 hours.  Invalid input(s): "FREET3" Anemia work up No results for input(s): "VITAMINB12", "FOLATE", "FERRITIN", "TIBC", "IRON", "RETICCTPCT" in the last 72 hours. Microbiology Recent Results (from the past 240 hour(s))  Resp Panel by RT-PCR (Flu A&B, Covid) Anterior Nasal Swab  Status: None   Collection Time: 07/31/22 12:30 PM   Specimen: Anterior Nasal Swab  Result Value Ref Range Status   SARS Coronavirus 2 by RT PCR NEGATIVE NEGATIVE Final    Comment: (NOTE) SARS-CoV-2 target nucleic acids are NOT DETECTED.  The SARS-CoV-2 RNA is generally detectable in upper respiratory specimens during the acute phase of infection. The lowest concentration of SARS-CoV-2 viral copies this assay can detect is 138 copies/mL. A negative result does not preclude SARS-Cov-2 infection and should not be used as the sole basis for treatment or other patient management decisions. A negative result may occur with  improper specimen collection/handling, submission of specimen other than nasopharyngeal swab, presence of viral mutation(s) within the areas targeted by this assay, and inadequate number of viral copies(<138  copies/mL). A negative result must be combined with clinical observations, patient history, and epidemiological information. The expected result is Negative.  Fact Sheet for Patients:  EntrepreneurPulse.com.au  Fact Sheet for Healthcare Providers:  IncredibleEmployment.be  This test is no t yet approved or cleared by the [place] FDA and  has been authorized for detection and/or diagnosis of SARS-CoV-2 by FDA under an Emergency Use Authorization (EUA). This EUA will remain  in effect (meaning this test can be used) for the duration of the COVID-19 declaration under Section 564(b)(1) of the Act, 21 U.S.C.section 360bbb-3(b)(1), unless the authorization is terminated  or revoked sooner.       Influenza A by PCR NEGATIVE NEGATIVE Final   Influenza B by PCR NEGATIVE NEGATIVE Final    Comment: (NOTE) The Xpert Xpress SARS-CoV-2/FLU/RSV plus assay is intended as an aid in the diagnosis of influenza from Nasopharyngeal swab specimens and should not be used as a sole basis for treatment. Nasal washings and aspirates are unacceptable for Xpert Xpress SARS-CoV-2/FLU/RSV testing.  Fact Sheet for Patients: EntrepreneurPulse.com.au  Fact Sheet for Healthcare Providers: IncredibleEmployment.be  This test is not yet approved or cleared by the [place] FDA and has been authorized for detection and/or diagnosis of SARS-CoV-2 by FDA under an Emergency Use Authorization (EUA). This EUA will remain in effect (meaning this test can be used) for the duration of the COVID-19 declaration under Section 564(b)(1) of the Act, 21 U.S.C. section 360bbb-3(b)(1), unless the authorization is terminated or revoked.  Performed at [facility], [address] [place], [place] [address]     Time coordinating discharge: 35 minutes  Signed: Terrilee Croak  [facility] 08/02/2022, 11:40 AM

## 2022-08-02 NOTE — Progress Notes (Signed)
Discharge instructions discussed with patient and family, verbalized agreement and understanding 

## 2022-08-04 DIAGNOSIS — I6529 Occlusion and stenosis of unspecified carotid artery: Secondary | ICD-10-CM | POA: Diagnosis not present

## 2022-08-04 DIAGNOSIS — R911 Solitary pulmonary nodule: Secondary | ICD-10-CM | POA: Diagnosis not present

## 2022-08-04 DIAGNOSIS — D631 Anemia in chronic kidney disease: Secondary | ICD-10-CM | POA: Diagnosis not present

## 2022-08-04 DIAGNOSIS — I714 Abdominal aortic aneurysm, without rupture, unspecified: Secondary | ICD-10-CM | POA: Diagnosis not present

## 2022-08-04 DIAGNOSIS — I5022 Chronic systolic (congestive) heart failure: Secondary | ICD-10-CM | POA: Diagnosis not present

## 2022-08-04 DIAGNOSIS — J690 Pneumonitis due to inhalation of food and vomit: Secondary | ICD-10-CM | POA: Diagnosis not present

## 2022-08-04 DIAGNOSIS — J441 Chronic obstructive pulmonary disease with (acute) exacerbation: Secondary | ICD-10-CM | POA: Diagnosis not present

## 2022-08-04 DIAGNOSIS — J9611 Chronic respiratory failure with hypoxia: Secondary | ICD-10-CM | POA: Diagnosis not present

## 2022-08-04 DIAGNOSIS — L89312 Pressure ulcer of right buttock, stage 2: Secondary | ICD-10-CM | POA: Diagnosis not present

## 2022-08-04 DIAGNOSIS — E1122 Type 2 diabetes mellitus with diabetic chronic kidney disease: Secondary | ICD-10-CM | POA: Diagnosis not present

## 2022-08-04 DIAGNOSIS — I129 Hypertensive chronic kidney disease with stage 1 through stage 4 chronic kidney disease, or unspecified chronic kidney disease: Secondary | ICD-10-CM | POA: Diagnosis not present

## 2022-08-04 DIAGNOSIS — N1832 Chronic kidney disease, stage 3b: Secondary | ICD-10-CM | POA: Diagnosis not present

## 2022-08-04 DIAGNOSIS — E785 Hyperlipidemia, unspecified: Secondary | ICD-10-CM | POA: Diagnosis not present

## 2022-08-04 DIAGNOSIS — I872 Venous insufficiency (chronic) (peripheral): Secondary | ICD-10-CM | POA: Diagnosis not present

## 2022-08-04 DIAGNOSIS — I429 Cardiomyopathy, unspecified: Secondary | ICD-10-CM | POA: Diagnosis not present

## 2022-08-04 DIAGNOSIS — Z79899 Other long term (current) drug therapy: Secondary | ICD-10-CM | POA: Diagnosis not present

## 2022-08-04 DIAGNOSIS — Z9981 Dependence on supplemental oxygen: Secondary | ICD-10-CM | POA: Diagnosis not present

## 2022-08-04 DIAGNOSIS — I2510 Atherosclerotic heart disease of native coronary artery without angina pectoris: Secondary | ICD-10-CM | POA: Diagnosis not present

## 2022-08-05 DIAGNOSIS — J449 Chronic obstructive pulmonary disease, unspecified: Secondary | ICD-10-CM | POA: Diagnosis not present

## 2022-08-05 DIAGNOSIS — J9601 Acute respiratory failure with hypoxia: Secondary | ICD-10-CM | POA: Diagnosis not present

## 2022-08-05 DIAGNOSIS — J439 Emphysema, unspecified: Secondary | ICD-10-CM | POA: Diagnosis not present

## 2022-08-06 DIAGNOSIS — Z9981 Dependence on supplemental oxygen: Secondary | ICD-10-CM | POA: Diagnosis not present

## 2022-08-06 DIAGNOSIS — I6529 Occlusion and stenosis of unspecified carotid artery: Secondary | ICD-10-CM | POA: Diagnosis not present

## 2022-08-06 DIAGNOSIS — I5022 Chronic systolic (congestive) heart failure: Secondary | ICD-10-CM | POA: Diagnosis not present

## 2022-08-06 DIAGNOSIS — J9611 Chronic respiratory failure with hypoxia: Secondary | ICD-10-CM | POA: Diagnosis not present

## 2022-08-06 DIAGNOSIS — I2510 Atherosclerotic heart disease of native coronary artery without angina pectoris: Secondary | ICD-10-CM | POA: Diagnosis not present

## 2022-08-06 DIAGNOSIS — E785 Hyperlipidemia, unspecified: Secondary | ICD-10-CM | POA: Diagnosis not present

## 2022-08-06 DIAGNOSIS — I872 Venous insufficiency (chronic) (peripheral): Secondary | ICD-10-CM | POA: Diagnosis not present

## 2022-08-06 DIAGNOSIS — I429 Cardiomyopathy, unspecified: Secondary | ICD-10-CM | POA: Diagnosis not present

## 2022-08-06 DIAGNOSIS — L89312 Pressure ulcer of right buttock, stage 2: Secondary | ICD-10-CM | POA: Diagnosis not present

## 2022-08-06 DIAGNOSIS — D631 Anemia in chronic kidney disease: Secondary | ICD-10-CM | POA: Diagnosis not present

## 2022-08-06 DIAGNOSIS — E1122 Type 2 diabetes mellitus with diabetic chronic kidney disease: Secondary | ICD-10-CM | POA: Diagnosis not present

## 2022-08-06 DIAGNOSIS — R911 Solitary pulmonary nodule: Secondary | ICD-10-CM | POA: Diagnosis not present

## 2022-08-06 DIAGNOSIS — J441 Chronic obstructive pulmonary disease with (acute) exacerbation: Secondary | ICD-10-CM | POA: Diagnosis not present

## 2022-08-06 DIAGNOSIS — I714 Abdominal aortic aneurysm, without rupture, unspecified: Secondary | ICD-10-CM | POA: Diagnosis not present

## 2022-08-06 DIAGNOSIS — I129 Hypertensive chronic kidney disease with stage 1 through stage 4 chronic kidney disease, or unspecified chronic kidney disease: Secondary | ICD-10-CM | POA: Diagnosis not present

## 2022-08-06 DIAGNOSIS — N1832 Chronic kidney disease, stage 3b: Secondary | ICD-10-CM | POA: Diagnosis not present

## 2022-08-07 DIAGNOSIS — I714 Abdominal aortic aneurysm, without rupture, unspecified: Secondary | ICD-10-CM | POA: Diagnosis not present

## 2022-08-07 DIAGNOSIS — E1122 Type 2 diabetes mellitus with diabetic chronic kidney disease: Secondary | ICD-10-CM | POA: Diagnosis not present

## 2022-08-07 DIAGNOSIS — I2510 Atherosclerotic heart disease of native coronary artery without angina pectoris: Secondary | ICD-10-CM | POA: Diagnosis not present

## 2022-08-07 DIAGNOSIS — I872 Venous insufficiency (chronic) (peripheral): Secondary | ICD-10-CM | POA: Diagnosis not present

## 2022-08-07 DIAGNOSIS — D631 Anemia in chronic kidney disease: Secondary | ICD-10-CM | POA: Diagnosis not present

## 2022-08-07 DIAGNOSIS — I6529 Occlusion and stenosis of unspecified carotid artery: Secondary | ICD-10-CM | POA: Diagnosis not present

## 2022-08-07 DIAGNOSIS — R911 Solitary pulmonary nodule: Secondary | ICD-10-CM | POA: Diagnosis not present

## 2022-08-07 DIAGNOSIS — I129 Hypertensive chronic kidney disease with stage 1 through stage 4 chronic kidney disease, or unspecified chronic kidney disease: Secondary | ICD-10-CM | POA: Diagnosis not present

## 2022-08-07 DIAGNOSIS — L89312 Pressure ulcer of right buttock, stage 2: Secondary | ICD-10-CM | POA: Diagnosis not present

## 2022-08-07 DIAGNOSIS — N1832 Chronic kidney disease, stage 3b: Secondary | ICD-10-CM | POA: Diagnosis not present

## 2022-08-07 DIAGNOSIS — J9611 Chronic respiratory failure with hypoxia: Secondary | ICD-10-CM | POA: Diagnosis not present

## 2022-08-07 DIAGNOSIS — I5022 Chronic systolic (congestive) heart failure: Secondary | ICD-10-CM | POA: Diagnosis not present

## 2022-08-07 DIAGNOSIS — Z9981 Dependence on supplemental oxygen: Secondary | ICD-10-CM | POA: Diagnosis not present

## 2022-08-07 DIAGNOSIS — J441 Chronic obstructive pulmonary disease with (acute) exacerbation: Secondary | ICD-10-CM | POA: Diagnosis not present

## 2022-08-07 DIAGNOSIS — I429 Cardiomyopathy, unspecified: Secondary | ICD-10-CM | POA: Diagnosis not present

## 2022-08-07 DIAGNOSIS — E785 Hyperlipidemia, unspecified: Secondary | ICD-10-CM | POA: Diagnosis not present

## 2022-08-08 DIAGNOSIS — R059 Cough, unspecified: Secondary | ICD-10-CM | POA: Diagnosis not present

## 2022-08-10 DIAGNOSIS — J441 Chronic obstructive pulmonary disease with (acute) exacerbation: Secondary | ICD-10-CM | POA: Diagnosis not present

## 2022-08-10 DIAGNOSIS — I429 Cardiomyopathy, unspecified: Secondary | ICD-10-CM | POA: Diagnosis not present

## 2022-08-10 DIAGNOSIS — L89312 Pressure ulcer of right buttock, stage 2: Secondary | ICD-10-CM | POA: Diagnosis not present

## 2022-08-10 DIAGNOSIS — I6529 Occlusion and stenosis of unspecified carotid artery: Secondary | ICD-10-CM | POA: Diagnosis not present

## 2022-08-10 DIAGNOSIS — J9611 Chronic respiratory failure with hypoxia: Secondary | ICD-10-CM | POA: Diagnosis not present

## 2022-08-10 DIAGNOSIS — I5022 Chronic systolic (congestive) heart failure: Secondary | ICD-10-CM | POA: Diagnosis not present

## 2022-08-10 DIAGNOSIS — I129 Hypertensive chronic kidney disease with stage 1 through stage 4 chronic kidney disease, or unspecified chronic kidney disease: Secondary | ICD-10-CM | POA: Diagnosis not present

## 2022-08-10 DIAGNOSIS — E1122 Type 2 diabetes mellitus with diabetic chronic kidney disease: Secondary | ICD-10-CM | POA: Diagnosis not present

## 2022-08-10 DIAGNOSIS — N1832 Chronic kidney disease, stage 3b: Secondary | ICD-10-CM | POA: Diagnosis not present

## 2022-08-10 DIAGNOSIS — D631 Anemia in chronic kidney disease: Secondary | ICD-10-CM | POA: Diagnosis not present

## 2022-08-10 DIAGNOSIS — E785 Hyperlipidemia, unspecified: Secondary | ICD-10-CM | POA: Diagnosis not present

## 2022-08-10 DIAGNOSIS — I2510 Atherosclerotic heart disease of native coronary artery without angina pectoris: Secondary | ICD-10-CM | POA: Diagnosis not present

## 2022-08-10 DIAGNOSIS — Z9981 Dependence on supplemental oxygen: Secondary | ICD-10-CM | POA: Diagnosis not present

## 2022-08-10 DIAGNOSIS — I714 Abdominal aortic aneurysm, without rupture, unspecified: Secondary | ICD-10-CM | POA: Diagnosis not present

## 2022-08-10 DIAGNOSIS — I872 Venous insufficiency (chronic) (peripheral): Secondary | ICD-10-CM | POA: Diagnosis not present

## 2022-08-10 DIAGNOSIS — R911 Solitary pulmonary nodule: Secondary | ICD-10-CM | POA: Diagnosis not present

## 2022-08-11 DIAGNOSIS — J441 Chronic obstructive pulmonary disease with (acute) exacerbation: Secondary | ICD-10-CM | POA: Diagnosis not present

## 2022-08-11 DIAGNOSIS — I714 Abdominal aortic aneurysm, without rupture, unspecified: Secondary | ICD-10-CM | POA: Diagnosis not present

## 2022-08-11 DIAGNOSIS — D631 Anemia in chronic kidney disease: Secondary | ICD-10-CM | POA: Diagnosis not present

## 2022-08-11 DIAGNOSIS — J9611 Chronic respiratory failure with hypoxia: Secondary | ICD-10-CM | POA: Diagnosis not present

## 2022-08-11 DIAGNOSIS — R911 Solitary pulmonary nodule: Secondary | ICD-10-CM | POA: Diagnosis not present

## 2022-08-11 DIAGNOSIS — I517 Cardiomegaly: Secondary | ICD-10-CM | POA: Diagnosis not present

## 2022-08-11 DIAGNOSIS — R918 Other nonspecific abnormal finding of lung field: Secondary | ICD-10-CM | POA: Diagnosis not present

## 2022-08-11 DIAGNOSIS — E1122 Type 2 diabetes mellitus with diabetic chronic kidney disease: Secondary | ICD-10-CM | POA: Diagnosis not present

## 2022-08-11 DIAGNOSIS — I700 Atherosclerosis of aorta: Secondary | ICD-10-CM | POA: Diagnosis not present

## 2022-08-11 DIAGNOSIS — Z9981 Dependence on supplemental oxygen: Secondary | ICD-10-CM | POA: Diagnosis not present

## 2022-08-11 DIAGNOSIS — I6529 Occlusion and stenosis of unspecified carotid artery: Secondary | ICD-10-CM | POA: Diagnosis not present

## 2022-08-11 DIAGNOSIS — I872 Venous insufficiency (chronic) (peripheral): Secondary | ICD-10-CM | POA: Diagnosis not present

## 2022-08-11 DIAGNOSIS — J984 Other disorders of lung: Secondary | ICD-10-CM | POA: Diagnosis not present

## 2022-08-11 DIAGNOSIS — L89312 Pressure ulcer of right buttock, stage 2: Secondary | ICD-10-CM | POA: Diagnosis not present

## 2022-08-11 DIAGNOSIS — J90 Pleural effusion, not elsewhere classified: Secondary | ICD-10-CM | POA: Diagnosis not present

## 2022-08-11 DIAGNOSIS — I5022 Chronic systolic (congestive) heart failure: Secondary | ICD-10-CM | POA: Diagnosis not present

## 2022-08-11 DIAGNOSIS — E785 Hyperlipidemia, unspecified: Secondary | ICD-10-CM | POA: Diagnosis not present

## 2022-08-11 DIAGNOSIS — I429 Cardiomyopathy, unspecified: Secondary | ICD-10-CM | POA: Diagnosis not present

## 2022-08-11 DIAGNOSIS — I2510 Atherosclerotic heart disease of native coronary artery without angina pectoris: Secondary | ICD-10-CM | POA: Diagnosis not present

## 2022-08-11 DIAGNOSIS — I129 Hypertensive chronic kidney disease with stage 1 through stage 4 chronic kidney disease, or unspecified chronic kidney disease: Secondary | ICD-10-CM | POA: Diagnosis not present

## 2022-08-11 DIAGNOSIS — N1832 Chronic kidney disease, stage 3b: Secondary | ICD-10-CM | POA: Diagnosis not present

## 2022-08-13 DIAGNOSIS — N1832 Chronic kidney disease, stage 3b: Secondary | ICD-10-CM | POA: Diagnosis not present

## 2022-08-13 DIAGNOSIS — I5022 Chronic systolic (congestive) heart failure: Secondary | ICD-10-CM | POA: Diagnosis not present

## 2022-08-13 DIAGNOSIS — L89312 Pressure ulcer of right buttock, stage 2: Secondary | ICD-10-CM | POA: Diagnosis not present

## 2022-08-13 DIAGNOSIS — I872 Venous insufficiency (chronic) (peripheral): Secondary | ICD-10-CM | POA: Diagnosis not present

## 2022-08-13 DIAGNOSIS — E785 Hyperlipidemia, unspecified: Secondary | ICD-10-CM | POA: Diagnosis not present

## 2022-08-13 DIAGNOSIS — I429 Cardiomyopathy, unspecified: Secondary | ICD-10-CM | POA: Diagnosis not present

## 2022-08-13 DIAGNOSIS — I714 Abdominal aortic aneurysm, without rupture, unspecified: Secondary | ICD-10-CM | POA: Diagnosis not present

## 2022-08-13 DIAGNOSIS — E1122 Type 2 diabetes mellitus with diabetic chronic kidney disease: Secondary | ICD-10-CM | POA: Diagnosis not present

## 2022-08-13 DIAGNOSIS — J9611 Chronic respiratory failure with hypoxia: Secondary | ICD-10-CM | POA: Diagnosis not present

## 2022-08-13 DIAGNOSIS — D631 Anemia in chronic kidney disease: Secondary | ICD-10-CM | POA: Diagnosis not present

## 2022-08-13 DIAGNOSIS — Z9981 Dependence on supplemental oxygen: Secondary | ICD-10-CM | POA: Diagnosis not present

## 2022-08-13 DIAGNOSIS — R911 Solitary pulmonary nodule: Secondary | ICD-10-CM | POA: Diagnosis not present

## 2022-08-13 DIAGNOSIS — I129 Hypertensive chronic kidney disease with stage 1 through stage 4 chronic kidney disease, or unspecified chronic kidney disease: Secondary | ICD-10-CM | POA: Diagnosis not present

## 2022-08-13 DIAGNOSIS — J441 Chronic obstructive pulmonary disease with (acute) exacerbation: Secondary | ICD-10-CM | POA: Diagnosis not present

## 2022-08-13 DIAGNOSIS — I2510 Atherosclerotic heart disease of native coronary artery without angina pectoris: Secondary | ICD-10-CM | POA: Diagnosis not present

## 2022-08-13 DIAGNOSIS — I6529 Occlusion and stenosis of unspecified carotid artery: Secondary | ICD-10-CM | POA: Diagnosis not present

## 2022-08-14 DIAGNOSIS — I429 Cardiomyopathy, unspecified: Secondary | ICD-10-CM | POA: Diagnosis not present

## 2022-08-14 DIAGNOSIS — I129 Hypertensive chronic kidney disease with stage 1 through stage 4 chronic kidney disease, or unspecified chronic kidney disease: Secondary | ICD-10-CM | POA: Diagnosis not present

## 2022-08-14 DIAGNOSIS — E1122 Type 2 diabetes mellitus with diabetic chronic kidney disease: Secondary | ICD-10-CM | POA: Diagnosis not present

## 2022-08-14 DIAGNOSIS — I5022 Chronic systolic (congestive) heart failure: Secondary | ICD-10-CM | POA: Diagnosis not present

## 2022-08-14 DIAGNOSIS — E785 Hyperlipidemia, unspecified: Secondary | ICD-10-CM | POA: Diagnosis not present

## 2022-08-14 DIAGNOSIS — J9611 Chronic respiratory failure with hypoxia: Secondary | ICD-10-CM | POA: Diagnosis not present

## 2022-08-14 DIAGNOSIS — R911 Solitary pulmonary nodule: Secondary | ICD-10-CM | POA: Diagnosis not present

## 2022-08-14 DIAGNOSIS — I6529 Occlusion and stenosis of unspecified carotid artery: Secondary | ICD-10-CM | POA: Diagnosis not present

## 2022-08-14 DIAGNOSIS — I872 Venous insufficiency (chronic) (peripheral): Secondary | ICD-10-CM | POA: Diagnosis not present

## 2022-08-14 DIAGNOSIS — I2510 Atherosclerotic heart disease of native coronary artery without angina pectoris: Secondary | ICD-10-CM | POA: Diagnosis not present

## 2022-08-14 DIAGNOSIS — J441 Chronic obstructive pulmonary disease with (acute) exacerbation: Secondary | ICD-10-CM | POA: Diagnosis not present

## 2022-08-14 DIAGNOSIS — N1832 Chronic kidney disease, stage 3b: Secondary | ICD-10-CM | POA: Diagnosis not present

## 2022-08-14 DIAGNOSIS — I714 Abdominal aortic aneurysm, without rupture, unspecified: Secondary | ICD-10-CM | POA: Diagnosis not present

## 2022-08-14 DIAGNOSIS — L89312 Pressure ulcer of right buttock, stage 2: Secondary | ICD-10-CM | POA: Diagnosis not present

## 2022-08-14 DIAGNOSIS — D631 Anemia in chronic kidney disease: Secondary | ICD-10-CM | POA: Diagnosis not present

## 2022-08-14 DIAGNOSIS — Z9981 Dependence on supplemental oxygen: Secondary | ICD-10-CM | POA: Diagnosis not present

## 2022-08-17 DIAGNOSIS — I129 Hypertensive chronic kidney disease with stage 1 through stage 4 chronic kidney disease, or unspecified chronic kidney disease: Secondary | ICD-10-CM | POA: Diagnosis not present

## 2022-08-17 DIAGNOSIS — I2510 Atherosclerotic heart disease of native coronary artery without angina pectoris: Secondary | ICD-10-CM | POA: Diagnosis not present

## 2022-08-17 DIAGNOSIS — Z9981 Dependence on supplemental oxygen: Secondary | ICD-10-CM | POA: Diagnosis not present

## 2022-08-17 DIAGNOSIS — I6529 Occlusion and stenosis of unspecified carotid artery: Secondary | ICD-10-CM | POA: Diagnosis not present

## 2022-08-17 DIAGNOSIS — E1122 Type 2 diabetes mellitus with diabetic chronic kidney disease: Secondary | ICD-10-CM | POA: Diagnosis not present

## 2022-08-17 DIAGNOSIS — J441 Chronic obstructive pulmonary disease with (acute) exacerbation: Secondary | ICD-10-CM | POA: Diagnosis not present

## 2022-08-17 DIAGNOSIS — D631 Anemia in chronic kidney disease: Secondary | ICD-10-CM | POA: Diagnosis not present

## 2022-08-17 DIAGNOSIS — I429 Cardiomyopathy, unspecified: Secondary | ICD-10-CM | POA: Diagnosis not present

## 2022-08-17 DIAGNOSIS — J9611 Chronic respiratory failure with hypoxia: Secondary | ICD-10-CM | POA: Diagnosis not present

## 2022-08-17 DIAGNOSIS — R911 Solitary pulmonary nodule: Secondary | ICD-10-CM | POA: Diagnosis not present

## 2022-08-17 DIAGNOSIS — L89312 Pressure ulcer of right buttock, stage 2: Secondary | ICD-10-CM | POA: Diagnosis not present

## 2022-08-17 DIAGNOSIS — E785 Hyperlipidemia, unspecified: Secondary | ICD-10-CM | POA: Diagnosis not present

## 2022-08-17 DIAGNOSIS — I872 Venous insufficiency (chronic) (peripheral): Secondary | ICD-10-CM | POA: Diagnosis not present

## 2022-08-17 DIAGNOSIS — I5022 Chronic systolic (congestive) heart failure: Secondary | ICD-10-CM | POA: Diagnosis not present

## 2022-08-17 DIAGNOSIS — I714 Abdominal aortic aneurysm, without rupture, unspecified: Secondary | ICD-10-CM | POA: Diagnosis not present

## 2022-08-17 DIAGNOSIS — N1832 Chronic kidney disease, stage 3b: Secondary | ICD-10-CM | POA: Diagnosis not present

## 2022-08-18 DIAGNOSIS — D631 Anemia in chronic kidney disease: Secondary | ICD-10-CM | POA: Diagnosis not present

## 2022-08-18 DIAGNOSIS — I2510 Atherosclerotic heart disease of native coronary artery without angina pectoris: Secondary | ICD-10-CM | POA: Diagnosis not present

## 2022-08-18 DIAGNOSIS — E785 Hyperlipidemia, unspecified: Secondary | ICD-10-CM | POA: Diagnosis not present

## 2022-08-18 DIAGNOSIS — J441 Chronic obstructive pulmonary disease with (acute) exacerbation: Secondary | ICD-10-CM | POA: Diagnosis not present

## 2022-08-18 DIAGNOSIS — I714 Abdominal aortic aneurysm, without rupture, unspecified: Secondary | ICD-10-CM | POA: Diagnosis not present

## 2022-08-18 DIAGNOSIS — R911 Solitary pulmonary nodule: Secondary | ICD-10-CM | POA: Diagnosis not present

## 2022-08-18 DIAGNOSIS — I6529 Occlusion and stenosis of unspecified carotid artery: Secondary | ICD-10-CM | POA: Diagnosis not present

## 2022-08-18 DIAGNOSIS — J9611 Chronic respiratory failure with hypoxia: Secondary | ICD-10-CM | POA: Diagnosis not present

## 2022-08-18 DIAGNOSIS — I429 Cardiomyopathy, unspecified: Secondary | ICD-10-CM | POA: Diagnosis not present

## 2022-08-18 DIAGNOSIS — E1122 Type 2 diabetes mellitus with diabetic chronic kidney disease: Secondary | ICD-10-CM | POA: Diagnosis not present

## 2022-08-18 DIAGNOSIS — Z9981 Dependence on supplemental oxygen: Secondary | ICD-10-CM | POA: Diagnosis not present

## 2022-08-18 DIAGNOSIS — L89312 Pressure ulcer of right buttock, stage 2: Secondary | ICD-10-CM | POA: Diagnosis not present

## 2022-08-18 DIAGNOSIS — I5022 Chronic systolic (congestive) heart failure: Secondary | ICD-10-CM | POA: Diagnosis not present

## 2022-08-18 DIAGNOSIS — N1832 Chronic kidney disease, stage 3b: Secondary | ICD-10-CM | POA: Diagnosis not present

## 2022-08-18 DIAGNOSIS — I872 Venous insufficiency (chronic) (peripheral): Secondary | ICD-10-CM | POA: Diagnosis not present

## 2022-08-18 DIAGNOSIS — I129 Hypertensive chronic kidney disease with stage 1 through stage 4 chronic kidney disease, or unspecified chronic kidney disease: Secondary | ICD-10-CM | POA: Diagnosis not present

## 2022-08-20 DIAGNOSIS — J441 Chronic obstructive pulmonary disease with (acute) exacerbation: Secondary | ICD-10-CM | POA: Diagnosis not present

## 2022-08-20 DIAGNOSIS — D631 Anemia in chronic kidney disease: Secondary | ICD-10-CM | POA: Diagnosis not present

## 2022-08-20 DIAGNOSIS — I872 Venous insufficiency (chronic) (peripheral): Secondary | ICD-10-CM | POA: Diagnosis not present

## 2022-08-20 DIAGNOSIS — R911 Solitary pulmonary nodule: Secondary | ICD-10-CM | POA: Diagnosis not present

## 2022-08-20 DIAGNOSIS — E785 Hyperlipidemia, unspecified: Secondary | ICD-10-CM | POA: Diagnosis not present

## 2022-08-20 DIAGNOSIS — Z9981 Dependence on supplemental oxygen: Secondary | ICD-10-CM | POA: Diagnosis not present

## 2022-08-20 DIAGNOSIS — E1122 Type 2 diabetes mellitus with diabetic chronic kidney disease: Secondary | ICD-10-CM | POA: Diagnosis not present

## 2022-08-20 DIAGNOSIS — I714 Abdominal aortic aneurysm, without rupture, unspecified: Secondary | ICD-10-CM | POA: Diagnosis not present

## 2022-08-20 DIAGNOSIS — N1832 Chronic kidney disease, stage 3b: Secondary | ICD-10-CM | POA: Diagnosis not present

## 2022-08-20 DIAGNOSIS — I5022 Chronic systolic (congestive) heart failure: Secondary | ICD-10-CM | POA: Diagnosis not present

## 2022-08-20 DIAGNOSIS — J9611 Chronic respiratory failure with hypoxia: Secondary | ICD-10-CM | POA: Diagnosis not present

## 2022-08-20 DIAGNOSIS — I129 Hypertensive chronic kidney disease with stage 1 through stage 4 chronic kidney disease, or unspecified chronic kidney disease: Secondary | ICD-10-CM | POA: Diagnosis not present

## 2022-08-20 DIAGNOSIS — I2510 Atherosclerotic heart disease of native coronary artery without angina pectoris: Secondary | ICD-10-CM | POA: Diagnosis not present

## 2022-08-20 DIAGNOSIS — L89312 Pressure ulcer of right buttock, stage 2: Secondary | ICD-10-CM | POA: Diagnosis not present

## 2022-08-20 DIAGNOSIS — I429 Cardiomyopathy, unspecified: Secondary | ICD-10-CM | POA: Diagnosis not present

## 2022-08-20 DIAGNOSIS — I6529 Occlusion and stenosis of unspecified carotid artery: Secondary | ICD-10-CM | POA: Diagnosis not present

## 2022-08-25 DIAGNOSIS — I129 Hypertensive chronic kidney disease with stage 1 through stage 4 chronic kidney disease, or unspecified chronic kidney disease: Secondary | ICD-10-CM | POA: Diagnosis not present

## 2022-08-25 DIAGNOSIS — J9611 Chronic respiratory failure with hypoxia: Secondary | ICD-10-CM | POA: Diagnosis not present

## 2022-08-25 DIAGNOSIS — D631 Anemia in chronic kidney disease: Secondary | ICD-10-CM | POA: Diagnosis not present

## 2022-08-25 DIAGNOSIS — Z9981 Dependence on supplemental oxygen: Secondary | ICD-10-CM | POA: Diagnosis not present

## 2022-08-25 DIAGNOSIS — E785 Hyperlipidemia, unspecified: Secondary | ICD-10-CM | POA: Diagnosis not present

## 2022-08-25 DIAGNOSIS — R911 Solitary pulmonary nodule: Secondary | ICD-10-CM | POA: Diagnosis not present

## 2022-08-25 DIAGNOSIS — I714 Abdominal aortic aneurysm, without rupture, unspecified: Secondary | ICD-10-CM | POA: Diagnosis not present

## 2022-08-25 DIAGNOSIS — L89312 Pressure ulcer of right buttock, stage 2: Secondary | ICD-10-CM | POA: Diagnosis not present

## 2022-08-25 DIAGNOSIS — I872 Venous insufficiency (chronic) (peripheral): Secondary | ICD-10-CM | POA: Diagnosis not present

## 2022-08-25 DIAGNOSIS — I2510 Atherosclerotic heart disease of native coronary artery without angina pectoris: Secondary | ICD-10-CM | POA: Diagnosis not present

## 2022-08-25 DIAGNOSIS — J441 Chronic obstructive pulmonary disease with (acute) exacerbation: Secondary | ICD-10-CM | POA: Diagnosis not present

## 2022-08-25 DIAGNOSIS — I6529 Occlusion and stenosis of unspecified carotid artery: Secondary | ICD-10-CM | POA: Diagnosis not present

## 2022-08-25 DIAGNOSIS — I5022 Chronic systolic (congestive) heart failure: Secondary | ICD-10-CM | POA: Diagnosis not present

## 2022-08-25 DIAGNOSIS — I429 Cardiomyopathy, unspecified: Secondary | ICD-10-CM | POA: Diagnosis not present

## 2022-08-25 DIAGNOSIS — E1122 Type 2 diabetes mellitus with diabetic chronic kidney disease: Secondary | ICD-10-CM | POA: Diagnosis not present

## 2022-08-25 DIAGNOSIS — N1832 Chronic kidney disease, stage 3b: Secondary | ICD-10-CM | POA: Diagnosis not present

## 2022-08-28 DIAGNOSIS — R911 Solitary pulmonary nodule: Secondary | ICD-10-CM | POA: Diagnosis not present

## 2022-08-28 DIAGNOSIS — Z9981 Dependence on supplemental oxygen: Secondary | ICD-10-CM | POA: Diagnosis not present

## 2022-08-28 DIAGNOSIS — I129 Hypertensive chronic kidney disease with stage 1 through stage 4 chronic kidney disease, or unspecified chronic kidney disease: Secondary | ICD-10-CM | POA: Diagnosis not present

## 2022-08-28 DIAGNOSIS — J9611 Chronic respiratory failure with hypoxia: Secondary | ICD-10-CM | POA: Diagnosis not present

## 2022-08-28 DIAGNOSIS — I5022 Chronic systolic (congestive) heart failure: Secondary | ICD-10-CM | POA: Diagnosis not present

## 2022-08-28 DIAGNOSIS — E785 Hyperlipidemia, unspecified: Secondary | ICD-10-CM | POA: Diagnosis not present

## 2022-08-28 DIAGNOSIS — I429 Cardiomyopathy, unspecified: Secondary | ICD-10-CM | POA: Diagnosis not present

## 2022-08-28 DIAGNOSIS — I872 Venous insufficiency (chronic) (peripheral): Secondary | ICD-10-CM | POA: Diagnosis not present

## 2022-08-28 DIAGNOSIS — L89312 Pressure ulcer of right buttock, stage 2: Secondary | ICD-10-CM | POA: Diagnosis not present

## 2022-08-28 DIAGNOSIS — D631 Anemia in chronic kidney disease: Secondary | ICD-10-CM | POA: Diagnosis not present

## 2022-08-28 DIAGNOSIS — I6529 Occlusion and stenosis of unspecified carotid artery: Secondary | ICD-10-CM | POA: Diagnosis not present

## 2022-08-28 DIAGNOSIS — I714 Abdominal aortic aneurysm, without rupture, unspecified: Secondary | ICD-10-CM | POA: Diagnosis not present

## 2022-08-28 DIAGNOSIS — N1832 Chronic kidney disease, stage 3b: Secondary | ICD-10-CM | POA: Diagnosis not present

## 2022-08-28 DIAGNOSIS — J441 Chronic obstructive pulmonary disease with (acute) exacerbation: Secondary | ICD-10-CM | POA: Diagnosis not present

## 2022-08-28 DIAGNOSIS — E1122 Type 2 diabetes mellitus with diabetic chronic kidney disease: Secondary | ICD-10-CM | POA: Diagnosis not present

## 2022-08-28 DIAGNOSIS — I2510 Atherosclerotic heart disease of native coronary artery without angina pectoris: Secondary | ICD-10-CM | POA: Diagnosis not present

## 2022-08-30 DIAGNOSIS — N1832 Chronic kidney disease, stage 3b: Secondary | ICD-10-CM | POA: Diagnosis not present

## 2022-08-30 DIAGNOSIS — E1159 Type 2 diabetes mellitus with other circulatory complications: Secondary | ICD-10-CM | POA: Diagnosis not present

## 2022-08-30 DIAGNOSIS — J449 Chronic obstructive pulmonary disease, unspecified: Secondary | ICD-10-CM | POA: Diagnosis not present

## 2022-09-03 DIAGNOSIS — R911 Solitary pulmonary nodule: Secondary | ICD-10-CM | POA: Diagnosis not present

## 2022-09-03 DIAGNOSIS — E785 Hyperlipidemia, unspecified: Secondary | ICD-10-CM | POA: Diagnosis not present

## 2022-09-03 DIAGNOSIS — Z9981 Dependence on supplemental oxygen: Secondary | ICD-10-CM | POA: Diagnosis not present

## 2022-09-03 DIAGNOSIS — J441 Chronic obstructive pulmonary disease with (acute) exacerbation: Secondary | ICD-10-CM | POA: Diagnosis not present

## 2022-09-03 DIAGNOSIS — D631 Anemia in chronic kidney disease: Secondary | ICD-10-CM | POA: Diagnosis not present

## 2022-09-03 DIAGNOSIS — I872 Venous insufficiency (chronic) (peripheral): Secondary | ICD-10-CM | POA: Diagnosis not present

## 2022-09-03 DIAGNOSIS — I429 Cardiomyopathy, unspecified: Secondary | ICD-10-CM | POA: Diagnosis not present

## 2022-09-03 DIAGNOSIS — I129 Hypertensive chronic kidney disease with stage 1 through stage 4 chronic kidney disease, or unspecified chronic kidney disease: Secondary | ICD-10-CM | POA: Diagnosis not present

## 2022-09-03 DIAGNOSIS — I6529 Occlusion and stenosis of unspecified carotid artery: Secondary | ICD-10-CM | POA: Diagnosis not present

## 2022-09-03 DIAGNOSIS — I2510 Atherosclerotic heart disease of native coronary artery without angina pectoris: Secondary | ICD-10-CM | POA: Diagnosis not present

## 2022-09-03 DIAGNOSIS — E1122 Type 2 diabetes mellitus with diabetic chronic kidney disease: Secondary | ICD-10-CM | POA: Diagnosis not present

## 2022-09-03 DIAGNOSIS — I5022 Chronic systolic (congestive) heart failure: Secondary | ICD-10-CM | POA: Diagnosis not present

## 2022-09-03 DIAGNOSIS — L89312 Pressure ulcer of right buttock, stage 2: Secondary | ICD-10-CM | POA: Diagnosis not present

## 2022-09-03 DIAGNOSIS — I714 Abdominal aortic aneurysm, without rupture, unspecified: Secondary | ICD-10-CM | POA: Diagnosis not present

## 2022-09-03 DIAGNOSIS — J9611 Chronic respiratory failure with hypoxia: Secondary | ICD-10-CM | POA: Diagnosis not present

## 2022-09-03 DIAGNOSIS — N1832 Chronic kidney disease, stage 3b: Secondary | ICD-10-CM | POA: Diagnosis not present

## 2022-09-04 DIAGNOSIS — D631 Anemia in chronic kidney disease: Secondary | ICD-10-CM | POA: Diagnosis not present

## 2022-09-04 DIAGNOSIS — E785 Hyperlipidemia, unspecified: Secondary | ICD-10-CM | POA: Diagnosis not present

## 2022-09-04 DIAGNOSIS — I6529 Occlusion and stenosis of unspecified carotid artery: Secondary | ICD-10-CM | POA: Diagnosis not present

## 2022-09-04 DIAGNOSIS — I129 Hypertensive chronic kidney disease with stage 1 through stage 4 chronic kidney disease, or unspecified chronic kidney disease: Secondary | ICD-10-CM | POA: Diagnosis not present

## 2022-09-04 DIAGNOSIS — I872 Venous insufficiency (chronic) (peripheral): Secondary | ICD-10-CM | POA: Diagnosis not present

## 2022-09-04 DIAGNOSIS — Z9981 Dependence on supplemental oxygen: Secondary | ICD-10-CM | POA: Diagnosis not present

## 2022-09-04 DIAGNOSIS — J9611 Chronic respiratory failure with hypoxia: Secondary | ICD-10-CM | POA: Diagnosis not present

## 2022-09-04 DIAGNOSIS — R911 Solitary pulmonary nodule: Secondary | ICD-10-CM | POA: Diagnosis not present

## 2022-09-04 DIAGNOSIS — I714 Abdominal aortic aneurysm, without rupture, unspecified: Secondary | ICD-10-CM | POA: Diagnosis not present

## 2022-09-04 DIAGNOSIS — E1122 Type 2 diabetes mellitus with diabetic chronic kidney disease: Secondary | ICD-10-CM | POA: Diagnosis not present

## 2022-09-04 DIAGNOSIS — J441 Chronic obstructive pulmonary disease with (acute) exacerbation: Secondary | ICD-10-CM | POA: Diagnosis not present

## 2022-09-04 DIAGNOSIS — L89312 Pressure ulcer of right buttock, stage 2: Secondary | ICD-10-CM | POA: Diagnosis not present

## 2022-09-04 DIAGNOSIS — I2510 Atherosclerotic heart disease of native coronary artery without angina pectoris: Secondary | ICD-10-CM | POA: Diagnosis not present

## 2022-09-04 DIAGNOSIS — N1832 Chronic kidney disease, stage 3b: Secondary | ICD-10-CM | POA: Diagnosis not present

## 2022-09-04 DIAGNOSIS — I429 Cardiomyopathy, unspecified: Secondary | ICD-10-CM | POA: Diagnosis not present

## 2022-09-04 DIAGNOSIS — I5022 Chronic systolic (congestive) heart failure: Secondary | ICD-10-CM | POA: Diagnosis not present

## 2022-09-05 DIAGNOSIS — J9601 Acute respiratory failure with hypoxia: Secondary | ICD-10-CM | POA: Diagnosis not present

## 2022-09-05 DIAGNOSIS — J439 Emphysema, unspecified: Secondary | ICD-10-CM | POA: Diagnosis not present

## 2022-09-05 DIAGNOSIS — J449 Chronic obstructive pulmonary disease, unspecified: Secondary | ICD-10-CM | POA: Diagnosis not present

## 2022-09-08 DIAGNOSIS — I872 Venous insufficiency (chronic) (peripheral): Secondary | ICD-10-CM | POA: Diagnosis not present

## 2022-09-08 DIAGNOSIS — I5022 Chronic systolic (congestive) heart failure: Secondary | ICD-10-CM | POA: Diagnosis not present

## 2022-09-08 DIAGNOSIS — I129 Hypertensive chronic kidney disease with stage 1 through stage 4 chronic kidney disease, or unspecified chronic kidney disease: Secondary | ICD-10-CM | POA: Diagnosis not present

## 2022-09-08 DIAGNOSIS — I714 Abdominal aortic aneurysm, without rupture, unspecified: Secondary | ICD-10-CM | POA: Diagnosis not present

## 2022-09-08 DIAGNOSIS — J441 Chronic obstructive pulmonary disease with (acute) exacerbation: Secondary | ICD-10-CM | POA: Diagnosis not present

## 2022-09-08 DIAGNOSIS — E785 Hyperlipidemia, unspecified: Secondary | ICD-10-CM | POA: Diagnosis not present

## 2022-09-08 DIAGNOSIS — E1122 Type 2 diabetes mellitus with diabetic chronic kidney disease: Secondary | ICD-10-CM | POA: Diagnosis not present

## 2022-09-08 DIAGNOSIS — N1832 Chronic kidney disease, stage 3b: Secondary | ICD-10-CM | POA: Diagnosis not present

## 2022-09-08 DIAGNOSIS — I429 Cardiomyopathy, unspecified: Secondary | ICD-10-CM | POA: Diagnosis not present

## 2022-09-08 DIAGNOSIS — J9611 Chronic respiratory failure with hypoxia: Secondary | ICD-10-CM | POA: Diagnosis not present

## 2022-09-08 DIAGNOSIS — R911 Solitary pulmonary nodule: Secondary | ICD-10-CM | POA: Diagnosis not present

## 2022-09-08 DIAGNOSIS — D631 Anemia in chronic kidney disease: Secondary | ICD-10-CM | POA: Diagnosis not present

## 2022-09-08 DIAGNOSIS — I6529 Occlusion and stenosis of unspecified carotid artery: Secondary | ICD-10-CM | POA: Diagnosis not present

## 2022-09-08 DIAGNOSIS — Z9981 Dependence on supplemental oxygen: Secondary | ICD-10-CM | POA: Diagnosis not present

## 2022-09-08 DIAGNOSIS — I2510 Atherosclerotic heart disease of native coronary artery without angina pectoris: Secondary | ICD-10-CM | POA: Diagnosis not present

## 2022-09-08 DIAGNOSIS — L89312 Pressure ulcer of right buttock, stage 2: Secondary | ICD-10-CM | POA: Diagnosis not present

## 2022-09-09 DIAGNOSIS — E1122 Type 2 diabetes mellitus with diabetic chronic kidney disease: Secondary | ICD-10-CM | POA: Diagnosis not present

## 2022-09-09 DIAGNOSIS — J9611 Chronic respiratory failure with hypoxia: Secondary | ICD-10-CM | POA: Diagnosis not present

## 2022-09-09 DIAGNOSIS — I429 Cardiomyopathy, unspecified: Secondary | ICD-10-CM | POA: Diagnosis not present

## 2022-09-09 DIAGNOSIS — N1832 Chronic kidney disease, stage 3b: Secondary | ICD-10-CM | POA: Diagnosis not present

## 2022-09-09 DIAGNOSIS — I2510 Atherosclerotic heart disease of native coronary artery without angina pectoris: Secondary | ICD-10-CM | POA: Diagnosis not present

## 2022-09-09 DIAGNOSIS — I872 Venous insufficiency (chronic) (peripheral): Secondary | ICD-10-CM | POA: Diagnosis not present

## 2022-09-09 DIAGNOSIS — D631 Anemia in chronic kidney disease: Secondary | ICD-10-CM | POA: Diagnosis not present

## 2022-09-09 DIAGNOSIS — E785 Hyperlipidemia, unspecified: Secondary | ICD-10-CM | POA: Diagnosis not present

## 2022-09-09 DIAGNOSIS — I714 Abdominal aortic aneurysm, without rupture, unspecified: Secondary | ICD-10-CM | POA: Diagnosis not present

## 2022-09-09 DIAGNOSIS — I129 Hypertensive chronic kidney disease with stage 1 through stage 4 chronic kidney disease, or unspecified chronic kidney disease: Secondary | ICD-10-CM | POA: Diagnosis not present

## 2022-09-09 DIAGNOSIS — I6529 Occlusion and stenosis of unspecified carotid artery: Secondary | ICD-10-CM | POA: Diagnosis not present

## 2022-09-09 DIAGNOSIS — L89312 Pressure ulcer of right buttock, stage 2: Secondary | ICD-10-CM | POA: Diagnosis not present

## 2022-09-09 DIAGNOSIS — I5022 Chronic systolic (congestive) heart failure: Secondary | ICD-10-CM | POA: Diagnosis not present

## 2022-09-09 DIAGNOSIS — Z9981 Dependence on supplemental oxygen: Secondary | ICD-10-CM | POA: Diagnosis not present

## 2022-09-09 DIAGNOSIS — J441 Chronic obstructive pulmonary disease with (acute) exacerbation: Secondary | ICD-10-CM | POA: Diagnosis not present

## 2022-09-09 DIAGNOSIS — R911 Solitary pulmonary nodule: Secondary | ICD-10-CM | POA: Diagnosis not present

## 2022-09-16 DIAGNOSIS — E1122 Type 2 diabetes mellitus with diabetic chronic kidney disease: Secondary | ICD-10-CM | POA: Diagnosis not present

## 2022-09-16 DIAGNOSIS — N1832 Chronic kidney disease, stage 3b: Secondary | ICD-10-CM | POA: Diagnosis not present

## 2022-09-16 DIAGNOSIS — Z9981 Dependence on supplemental oxygen: Secondary | ICD-10-CM | POA: Diagnosis not present

## 2022-09-16 DIAGNOSIS — E785 Hyperlipidemia, unspecified: Secondary | ICD-10-CM | POA: Diagnosis not present

## 2022-09-16 DIAGNOSIS — I714 Abdominal aortic aneurysm, without rupture, unspecified: Secondary | ICD-10-CM | POA: Diagnosis not present

## 2022-09-16 DIAGNOSIS — L89312 Pressure ulcer of right buttock, stage 2: Secondary | ICD-10-CM | POA: Diagnosis not present

## 2022-09-16 DIAGNOSIS — J9611 Chronic respiratory failure with hypoxia: Secondary | ICD-10-CM | POA: Diagnosis not present

## 2022-09-16 DIAGNOSIS — I6529 Occlusion and stenosis of unspecified carotid artery: Secondary | ICD-10-CM | POA: Diagnosis not present

## 2022-09-16 DIAGNOSIS — I429 Cardiomyopathy, unspecified: Secondary | ICD-10-CM | POA: Diagnosis not present

## 2022-09-16 DIAGNOSIS — D631 Anemia in chronic kidney disease: Secondary | ICD-10-CM | POA: Diagnosis not present

## 2022-09-16 DIAGNOSIS — I2510 Atherosclerotic heart disease of native coronary artery without angina pectoris: Secondary | ICD-10-CM | POA: Diagnosis not present

## 2022-09-16 DIAGNOSIS — I5022 Chronic systolic (congestive) heart failure: Secondary | ICD-10-CM | POA: Diagnosis not present

## 2022-09-16 DIAGNOSIS — I872 Venous insufficiency (chronic) (peripheral): Secondary | ICD-10-CM | POA: Diagnosis not present

## 2022-09-16 DIAGNOSIS — I129 Hypertensive chronic kidney disease with stage 1 through stage 4 chronic kidney disease, or unspecified chronic kidney disease: Secondary | ICD-10-CM | POA: Diagnosis not present

## 2022-09-16 DIAGNOSIS — J441 Chronic obstructive pulmonary disease with (acute) exacerbation: Secondary | ICD-10-CM | POA: Diagnosis not present

## 2022-09-16 DIAGNOSIS — R911 Solitary pulmonary nodule: Secondary | ICD-10-CM | POA: Diagnosis not present

## 2022-09-24 DIAGNOSIS — E785 Hyperlipidemia, unspecified: Secondary | ICD-10-CM | POA: Diagnosis not present

## 2022-09-24 DIAGNOSIS — J441 Chronic obstructive pulmonary disease with (acute) exacerbation: Secondary | ICD-10-CM | POA: Diagnosis not present

## 2022-09-24 DIAGNOSIS — N1832 Chronic kidney disease, stage 3b: Secondary | ICD-10-CM | POA: Diagnosis not present

## 2022-09-24 DIAGNOSIS — E1122 Type 2 diabetes mellitus with diabetic chronic kidney disease: Secondary | ICD-10-CM | POA: Diagnosis not present

## 2022-09-24 DIAGNOSIS — R911 Solitary pulmonary nodule: Secondary | ICD-10-CM | POA: Diagnosis not present

## 2022-09-24 DIAGNOSIS — D631 Anemia in chronic kidney disease: Secondary | ICD-10-CM | POA: Diagnosis not present

## 2022-09-24 DIAGNOSIS — I129 Hypertensive chronic kidney disease with stage 1 through stage 4 chronic kidney disease, or unspecified chronic kidney disease: Secondary | ICD-10-CM | POA: Diagnosis not present

## 2022-09-24 DIAGNOSIS — I714 Abdominal aortic aneurysm, without rupture, unspecified: Secondary | ICD-10-CM | POA: Diagnosis not present

## 2022-09-24 DIAGNOSIS — Z9981 Dependence on supplemental oxygen: Secondary | ICD-10-CM | POA: Diagnosis not present

## 2022-09-24 DIAGNOSIS — L89312 Pressure ulcer of right buttock, stage 2: Secondary | ICD-10-CM | POA: Diagnosis not present

## 2022-09-24 DIAGNOSIS — I2510 Atherosclerotic heart disease of native coronary artery without angina pectoris: Secondary | ICD-10-CM | POA: Diagnosis not present

## 2022-09-24 DIAGNOSIS — I872 Venous insufficiency (chronic) (peripheral): Secondary | ICD-10-CM | POA: Diagnosis not present

## 2022-09-24 DIAGNOSIS — I5022 Chronic systolic (congestive) heart failure: Secondary | ICD-10-CM | POA: Diagnosis not present

## 2022-09-24 DIAGNOSIS — I6529 Occlusion and stenosis of unspecified carotid artery: Secondary | ICD-10-CM | POA: Diagnosis not present

## 2022-09-24 DIAGNOSIS — I429 Cardiomyopathy, unspecified: Secondary | ICD-10-CM | POA: Diagnosis not present

## 2022-09-24 DIAGNOSIS — J9611 Chronic respiratory failure with hypoxia: Secondary | ICD-10-CM | POA: Diagnosis not present

## 2022-09-25 DIAGNOSIS — J441 Chronic obstructive pulmonary disease with (acute) exacerbation: Secondary | ICD-10-CM | POA: Diagnosis not present

## 2022-09-25 DIAGNOSIS — D631 Anemia in chronic kidney disease: Secondary | ICD-10-CM | POA: Diagnosis not present

## 2022-09-25 DIAGNOSIS — R911 Solitary pulmonary nodule: Secondary | ICD-10-CM | POA: Diagnosis not present

## 2022-09-25 DIAGNOSIS — I2510 Atherosclerotic heart disease of native coronary artery without angina pectoris: Secondary | ICD-10-CM | POA: Diagnosis not present

## 2022-09-25 DIAGNOSIS — I129 Hypertensive chronic kidney disease with stage 1 through stage 4 chronic kidney disease, or unspecified chronic kidney disease: Secondary | ICD-10-CM | POA: Diagnosis not present

## 2022-09-25 DIAGNOSIS — I429 Cardiomyopathy, unspecified: Secondary | ICD-10-CM | POA: Diagnosis not present

## 2022-09-25 DIAGNOSIS — L89312 Pressure ulcer of right buttock, stage 2: Secondary | ICD-10-CM | POA: Diagnosis not present

## 2022-09-25 DIAGNOSIS — N1832 Chronic kidney disease, stage 3b: Secondary | ICD-10-CM | POA: Diagnosis not present

## 2022-09-25 DIAGNOSIS — I714 Abdominal aortic aneurysm, without rupture, unspecified: Secondary | ICD-10-CM | POA: Diagnosis not present

## 2022-09-25 DIAGNOSIS — I5022 Chronic systolic (congestive) heart failure: Secondary | ICD-10-CM | POA: Diagnosis not present

## 2022-09-25 DIAGNOSIS — Z9981 Dependence on supplemental oxygen: Secondary | ICD-10-CM | POA: Diagnosis not present

## 2022-09-25 DIAGNOSIS — E785 Hyperlipidemia, unspecified: Secondary | ICD-10-CM | POA: Diagnosis not present

## 2022-09-25 DIAGNOSIS — J9611 Chronic respiratory failure with hypoxia: Secondary | ICD-10-CM | POA: Diagnosis not present

## 2022-09-25 DIAGNOSIS — I872 Venous insufficiency (chronic) (peripheral): Secondary | ICD-10-CM | POA: Diagnosis not present

## 2022-09-25 DIAGNOSIS — E1122 Type 2 diabetes mellitus with diabetic chronic kidney disease: Secondary | ICD-10-CM | POA: Diagnosis not present

## 2022-09-25 DIAGNOSIS — I6529 Occlusion and stenosis of unspecified carotid artery: Secondary | ICD-10-CM | POA: Diagnosis not present

## 2022-09-28 DIAGNOSIS — E785 Hyperlipidemia, unspecified: Secondary | ICD-10-CM | POA: Diagnosis not present

## 2022-09-28 DIAGNOSIS — D631 Anemia in chronic kidney disease: Secondary | ICD-10-CM | POA: Diagnosis not present

## 2022-09-28 DIAGNOSIS — I5022 Chronic systolic (congestive) heart failure: Secondary | ICD-10-CM | POA: Diagnosis not present

## 2022-09-28 DIAGNOSIS — Z9981 Dependence on supplemental oxygen: Secondary | ICD-10-CM | POA: Diagnosis not present

## 2022-09-28 DIAGNOSIS — J441 Chronic obstructive pulmonary disease with (acute) exacerbation: Secondary | ICD-10-CM | POA: Diagnosis not present

## 2022-09-28 DIAGNOSIS — I872 Venous insufficiency (chronic) (peripheral): Secondary | ICD-10-CM | POA: Diagnosis not present

## 2022-09-28 DIAGNOSIS — R911 Solitary pulmonary nodule: Secondary | ICD-10-CM | POA: Diagnosis not present

## 2022-09-28 DIAGNOSIS — N1832 Chronic kidney disease, stage 3b: Secondary | ICD-10-CM | POA: Diagnosis not present

## 2022-09-28 DIAGNOSIS — I2510 Atherosclerotic heart disease of native coronary artery without angina pectoris: Secondary | ICD-10-CM | POA: Diagnosis not present

## 2022-09-28 DIAGNOSIS — L89312 Pressure ulcer of right buttock, stage 2: Secondary | ICD-10-CM | POA: Diagnosis not present

## 2022-09-28 DIAGNOSIS — J9611 Chronic respiratory failure with hypoxia: Secondary | ICD-10-CM | POA: Diagnosis not present

## 2022-09-28 DIAGNOSIS — I429 Cardiomyopathy, unspecified: Secondary | ICD-10-CM | POA: Diagnosis not present

## 2022-09-28 DIAGNOSIS — I714 Abdominal aortic aneurysm, without rupture, unspecified: Secondary | ICD-10-CM | POA: Diagnosis not present

## 2022-09-28 DIAGNOSIS — I129 Hypertensive chronic kidney disease with stage 1 through stage 4 chronic kidney disease, or unspecified chronic kidney disease: Secondary | ICD-10-CM | POA: Diagnosis not present

## 2022-09-28 DIAGNOSIS — E1122 Type 2 diabetes mellitus with diabetic chronic kidney disease: Secondary | ICD-10-CM | POA: Diagnosis not present

## 2022-09-28 DIAGNOSIS — I6529 Occlusion and stenosis of unspecified carotid artery: Secondary | ICD-10-CM | POA: Diagnosis not present

## 2022-09-30 DIAGNOSIS — I872 Venous insufficiency (chronic) (peripheral): Secondary | ICD-10-CM | POA: Diagnosis not present

## 2022-09-30 DIAGNOSIS — I429 Cardiomyopathy, unspecified: Secondary | ICD-10-CM | POA: Diagnosis not present

## 2022-09-30 DIAGNOSIS — I2510 Atherosclerotic heart disease of native coronary artery without angina pectoris: Secondary | ICD-10-CM | POA: Diagnosis not present

## 2022-09-30 DIAGNOSIS — I6529 Occlusion and stenosis of unspecified carotid artery: Secondary | ICD-10-CM | POA: Diagnosis not present

## 2022-09-30 DIAGNOSIS — Z9981 Dependence on supplemental oxygen: Secondary | ICD-10-CM | POA: Diagnosis not present

## 2022-09-30 DIAGNOSIS — I5022 Chronic systolic (congestive) heart failure: Secondary | ICD-10-CM | POA: Diagnosis not present

## 2022-09-30 DIAGNOSIS — I714 Abdominal aortic aneurysm, without rupture, unspecified: Secondary | ICD-10-CM | POA: Diagnosis not present

## 2022-09-30 DIAGNOSIS — L89312 Pressure ulcer of right buttock, stage 2: Secondary | ICD-10-CM | POA: Diagnosis not present

## 2022-09-30 DIAGNOSIS — D631 Anemia in chronic kidney disease: Secondary | ICD-10-CM | POA: Diagnosis not present

## 2022-09-30 DIAGNOSIS — J441 Chronic obstructive pulmonary disease with (acute) exacerbation: Secondary | ICD-10-CM | POA: Diagnosis not present

## 2022-09-30 DIAGNOSIS — I129 Hypertensive chronic kidney disease with stage 1 through stage 4 chronic kidney disease, or unspecified chronic kidney disease: Secondary | ICD-10-CM | POA: Diagnosis not present

## 2022-09-30 DIAGNOSIS — E1122 Type 2 diabetes mellitus with diabetic chronic kidney disease: Secondary | ICD-10-CM | POA: Diagnosis not present

## 2022-09-30 DIAGNOSIS — R911 Solitary pulmonary nodule: Secondary | ICD-10-CM | POA: Diagnosis not present

## 2022-09-30 DIAGNOSIS — N1832 Chronic kidney disease, stage 3b: Secondary | ICD-10-CM | POA: Diagnosis not present

## 2022-09-30 DIAGNOSIS — E785 Hyperlipidemia, unspecified: Secondary | ICD-10-CM | POA: Diagnosis not present

## 2022-09-30 DIAGNOSIS — J9611 Chronic respiratory failure with hypoxia: Secondary | ICD-10-CM | POA: Diagnosis not present

## 2022-10-06 DIAGNOSIS — J9601 Acute respiratory failure with hypoxia: Secondary | ICD-10-CM | POA: Diagnosis not present

## 2022-10-06 DIAGNOSIS — J439 Emphysema, unspecified: Secondary | ICD-10-CM | POA: Diagnosis not present

## 2022-10-06 DIAGNOSIS — J449 Chronic obstructive pulmonary disease, unspecified: Secondary | ICD-10-CM | POA: Diagnosis not present

## 2022-10-12 ENCOUNTER — Other Ambulatory Visit: Payer: Self-pay | Admitting: *Deleted

## 2022-10-12 DIAGNOSIS — I714 Abdominal aortic aneurysm, without rupture, unspecified: Secondary | ICD-10-CM

## 2022-10-19 ENCOUNTER — Ambulatory Visit: Payer: Medicare Other

## 2022-10-19 ENCOUNTER — Ambulatory Visit (HOSPITAL_COMMUNITY): Admission: RE | Admit: 2022-10-19 | Payer: Medicare Other | Source: Ambulatory Visit

## 2022-10-29 DIAGNOSIS — E1159 Type 2 diabetes mellitus with other circulatory complications: Secondary | ICD-10-CM | POA: Diagnosis not present

## 2022-10-29 DIAGNOSIS — J449 Chronic obstructive pulmonary disease, unspecified: Secondary | ICD-10-CM | POA: Diagnosis not present

## 2022-10-29 DIAGNOSIS — N1832 Chronic kidney disease, stage 3b: Secondary | ICD-10-CM | POA: Diagnosis not present

## 2022-11-04 DIAGNOSIS — J9601 Acute respiratory failure with hypoxia: Secondary | ICD-10-CM | POA: Diagnosis not present

## 2022-11-04 DIAGNOSIS — J439 Emphysema, unspecified: Secondary | ICD-10-CM | POA: Diagnosis not present

## 2022-11-04 DIAGNOSIS — J449 Chronic obstructive pulmonary disease, unspecified: Secondary | ICD-10-CM | POA: Diagnosis not present

## 2022-11-17 ENCOUNTER — Ambulatory Visit: Payer: Medicare Other | Admitting: Physician Assistant

## 2022-11-17 ENCOUNTER — Ambulatory Visit (HOSPITAL_COMMUNITY)
Admission: RE | Admit: 2022-11-17 | Discharge: 2022-11-17 | Disposition: A | Discharge status: Home / Self Care | Payer: Medicare Other | Source: Ambulatory Visit | Attending: Vascular Surgery | Admitting: Vascular Surgery

## 2022-11-17 VITALS — BP 132/67 | HR 75 | Temp 97.6°F | Ht 71.0 in | Wt 158.0 lb

## 2022-11-17 DIAGNOSIS — I714 Abdominal aortic aneurysm, without rupture, unspecified: Secondary | ICD-10-CM

## 2022-11-17 NOTE — Progress Notes (Signed)
[facility] [place] HISTORY AND PHYSICAL   History of Present Illness:  Patient is a 87 y.o. year old male who presents for evaluation of abdominal aortic aneurysm. He was last seen by Dr. Stanford Breed on 07/01/21 and the AAA's largest diameter was 4 cm on CTA.  He denies sever lumbar or abdominal pain.  He has been followed in the past by DR. Early for GSV ablation with venous insufficieny.     He is independent with ambulation and a straight cane for support.  He denise claudication, rest pain or non healing ulcers.         Past Medical History:  Diagnosis Date   AAA (abdominal aortic aneurysm) ([place])    Acute hypoxemic respiratory failure ([place]) 07/12/2021   Anemia    Asthma 02/07/2018   Benign neoplasm of colon    Blood in the stool    Bradycardia 12/19/2018   Bronchitis    Cancer ([place])    prostate   Cardiomyopathy ([place]) 12/18/2015   Carotid artery occlusion    Chronic obstructive pulmonary disease with acute exacerbation ([place]) 07/12/2021   Chronic rhinitis 02/26/2016   Chronic venous insufficiency 12/18/2015   CKD (chronic kidney disease), stage III ([place]) 07/12/2021   Community acquired pneumonia of right lower lobe of lung 07/12/2021   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   PFTs 01/18/14:  FeV1 1.58 45% FVC 2.0 50% FeV1/FVC 79% Fef 25-75 795 ( 40% improved after BD) CXR: Copd changes ONO 03/07/14 desats to <88% on RA    Diarrhea    Diverticulitis    DM type 2 (diabetes mellitus, type 2) ([place])    patient denies every being diagnosed with diabetes   Fatigue 12/12/2018   Hyperlipidemia    Hypertension    Hypertensive heart disease    Hypoxemia 04/10/2014   Overview:  Last Assessment & Plan:  Cont nocturnal oxygen therapy 2L    Influenza 07/29/2021   Kidney stone    LBBB (left bundle branch block) 12/18/2015   Leg swelling 11/28/2013   Orthostatic hypotension 12/19/2018   Other symptoms involving cardiovascular system 11/28/2013   Pressure injury of skin  07/30/2021   Prostate cancer ([place])    prostate    Renal insufficiency 09/26/2021   Respiratory failure ([place]) 07/29/2021   Sepsis ([place]) 07/12/2021   Syncope 12/18/2015   Varicose veins of lower extremities with other complications 0000000    Past Surgical History:  Procedure Laterality Date    cardiac catheterization  12/29/2009   CATARACT EXTRACTION Bilateral    ECTROPION REPAIR Bilateral 03/27/2022   Procedure: REPAIR OF ECTROPION;  Surgeon: Delia Chimes, MD;  Location: [place];  Service: Ophthalmology;  Laterality: Bilateral;   ENDOVENOUS ABLATION SAPHENOUS VEIN W/ LASER Right 03/29/2014   EVLA right greater saphenous vein     gold seed implants  08/31/2009   treatment of prostate cancer   HERNIA REPAIR Bilateral    LITHOTRIPSY  08/31/2008   PROSTATE SURGERY     pt reports he "think they removed" his prostate   right ear surgery       Social History Social History   Tobacco Use   Smoking status: Former    Packs/day: 2.50    Years: 40.00    Additional pack years: 0.00    Total pack years: 100.00    Types: Cigarettes    Quit date: 09/01/1999    Years since quitting: 23.2   Smokeless tobacco: Never  Vaping Use   Vaping  Use: Never used  Substance Use Topics   Alcohol use: No   Drug use: No    Family History Family History  Problem Relation Age of Onset   Cancer - Colon Mother        deceased   Hypertension Mother    Diabetes Mother    Emphysema Sister        deceased   Heart disease Sister    Asthma Sister    Kidney failure Father    Heart failure Father     Allergies  Allergies  Allergen Reactions   Ace Inhibitors Cough     Current Outpatient Medications  Medication Sig Dispense Refill   albuterol (VENTOLIN HFA) 108 (90 Base) MCG/ACT inhaler Inhale 2 puffs into the lungs every 6 (six) hours as needed for wheezing or shortness of breath.     ASCORBIC ACID PO Take 1 tablet by mouth in the morning. Vitamin C, unknown strength     aspirin EC 81  MG tablet Take 81 mg by mouth in the morning.     atorvastatin (LIPITOR) 40 MG tablet Take 40 mg by mouth in the morning.     fenofibrate 160 MG tablet Take 160 mg by mouth in the morning.     FLOVENT HFA 110 MCG/ACT inhaler Inhale 2 puffs into the lungs 2 (two) times daily as needed (Shortness of breath).     furosemide (LASIX) 40 MG tablet Take 40 mg by mouth in the morning.     guaiFENesin-dextromethorphan (ROBITUSSIN DM) 100-10 MG/5ML syrup Take 5 mLs by mouth every 6 (six) hours as needed for cough. 118 mL 0   hydrOXYzine (ATARAX) 25 MG tablet Take 25 mg by mouth at bedtime.     losartan (COZAAR) 100 MG tablet Take 100 mg by mouth in the morning.     MELATONIN PO Take 1 tablet by mouth at bedtime.     Multiple Vitamins-Minerals (CENTRUM SILVER 50+MEN) TABS Take 1 tablet by mouth in the morning.     Omega-3 Fatty Acids (FISH OIL PO) Take 1 capsule by mouth in the morning.     OXYGEN Inhale 2 L/min into the lungs daily as needed (for shortness of breath).     No current facility-administered medications for this visit.    ROS:   General:  No weight loss, Fever, chills  HEENT: No recent headaches, no nasal bleeding, no visual changes, no sore throat  Neurologic: No dizziness, blackouts, seizures. No recent symptoms of stroke or mini- stroke. No recent episodes of slurred speech, or temporary blindness.  Cardiac: No recent episodes of chest pain/pressure, no shortness of breath at rest.  No shortness of breath with exertion.  Denies history of atrial fibrillation or irregular heartbeat  Vascular: No history of rest pain in feet.  No history of claudication.  No history of non-healing ulcer, No history of DVT   Pulmonary: No home oxygen, no productive cough, no hemoptysis,  No asthma or wheezing  Musculoskeletal:  [ ]  Arthritis, [ ]  Low back pain,  [ x] Joint pain  Hematologic:No history of hypercoagulable state.  No history of easy bleeding.  No history of anemia  Gastrointestinal:  No hematochezia or melena,  No gastroesophageal reflux, no trouble swallowing  Urinary: [ ]  chronic Kidney disease, [ ]  on HD - [ ]  MWF or [ ]  TTHS, [ ]  Burning with urination, [ ]  Frequent urination, [ ]  Difficulty urinating;   Skin: No rashes  Psychological: No history of anxiety,  No history of depression   Physical Examination  Vitals:   11/17/22 1023  BP: 132/67  Pulse: 75  Temp: 97.6 F (36.4 C)  TempSrc: Temporal  SpO2: 98%  Weight: 158 lb (71.7 kg)  Height: 5\' 11"  (1.803 m)    Body mass index is 22.04 kg/m.  General:  Alert and oriented, no acute distress HEENT: Normal Neck: No bruit or JVD Pulmonary: Clear to auscultation bilaterally Cardiac: Regular Rate and Rhythm without murmur Gastrointestinal: Soft, non-tender, non-distended, no mass, no scars Skin: No rash Extremity Pulses:   radial, brachial, femoral, dorsalis pedis, posterior tibial pulses bilaterally Musculoskeletal: No deformity or edema  Neurologic: Upper and lower extremity motor 5/5 and symmetric  DATA:    ASSESSMENT:    PLAN:   Roxy Horseman PA-C [facility] [place] Office: [phone]  MD in clinic [place]

## 2022-11-19 ENCOUNTER — Encounter: Payer: Self-pay | Admitting: Physician Assistant

## 2022-12-05 DIAGNOSIS — J439 Emphysema, unspecified: Secondary | ICD-10-CM | POA: Diagnosis not present

## 2022-12-05 DIAGNOSIS — J9601 Acute respiratory failure with hypoxia: Secondary | ICD-10-CM | POA: Diagnosis not present

## 2022-12-05 DIAGNOSIS — J449 Chronic obstructive pulmonary disease, unspecified: Secondary | ICD-10-CM | POA: Diagnosis not present

## 2022-12-28 DIAGNOSIS — J449 Chronic obstructive pulmonary disease, unspecified: Secondary | ICD-10-CM | POA: Diagnosis not present

## 2022-12-30 ENCOUNTER — Telehealth: Payer: Self-pay

## 2022-12-30 NOTE — Patient Outreach (Signed)
  Care Coordination   Initial Visit Note   12/30/2022 Name: Harry Norris MRN: [id number] DOB: [birth year]  Harry Norris is a 87 y.o. year old male who sees Harry Fusi, MD for primary care. I spoke with  Harry Norris by phone today.  What matters to the patients health and wellness today?  Placed call to patient to review and offer [facility] care coordination program. Patient reports that he is doing well and denies any needs today.     SDOH assessments and interventions completed:  No     Care Coordination Interventions:  No, not indicated   Follow up plan: No further intervention required.   Encounter Outcome:  Pt. Refused   Rowe Pavy, RN, BSN, CEN [facility] [phone]

## 2023-01-04 DIAGNOSIS — J449 Chronic obstructive pulmonary disease, unspecified: Secondary | ICD-10-CM | POA: Diagnosis not present

## 2023-01-04 DIAGNOSIS — J439 Emphysema, unspecified: Secondary | ICD-10-CM | POA: Diagnosis not present

## 2023-01-04 DIAGNOSIS — J9601 Acute respiratory failure with hypoxia: Secondary | ICD-10-CM | POA: Diagnosis not present

## 2023-02-04 DIAGNOSIS — J439 Emphysema, unspecified: Secondary | ICD-10-CM | POA: Diagnosis not present

## 2023-02-04 DIAGNOSIS — J9601 Acute respiratory failure with hypoxia: Secondary | ICD-10-CM | POA: Diagnosis not present

## 2023-02-04 DIAGNOSIS — J449 Chronic obstructive pulmonary disease, unspecified: Secondary | ICD-10-CM | POA: Diagnosis not present

## 2023-02-23 DIAGNOSIS — J449 Chronic obstructive pulmonary disease, unspecified: Secondary | ICD-10-CM | POA: Diagnosis not present

## 2023-02-23 DIAGNOSIS — N1832 Chronic kidney disease, stage 3b: Secondary | ICD-10-CM | POA: Diagnosis not present

## 2023-02-23 DIAGNOSIS — E785 Hyperlipidemia, unspecified: Secondary | ICD-10-CM | POA: Diagnosis not present

## 2023-02-23 DIAGNOSIS — I5022 Chronic systolic (congestive) heart failure: Secondary | ICD-10-CM | POA: Diagnosis not present

## 2023-02-23 DIAGNOSIS — D638 Anemia in other chronic diseases classified elsewhere: Secondary | ICD-10-CM | POA: Diagnosis not present

## 2023-02-23 DIAGNOSIS — E1159 Type 2 diabetes mellitus with other circulatory complications: Secondary | ICD-10-CM | POA: Diagnosis not present

## 2023-03-06 DIAGNOSIS — J9601 Acute respiratory failure with hypoxia: Secondary | ICD-10-CM | POA: Diagnosis not present

## 2023-03-06 DIAGNOSIS — J439 Emphysema, unspecified: Secondary | ICD-10-CM | POA: Diagnosis not present

## 2023-03-06 DIAGNOSIS — J449 Chronic obstructive pulmonary disease, unspecified: Secondary | ICD-10-CM | POA: Diagnosis not present

## 2023-03-09 DIAGNOSIS — J449 Chronic obstructive pulmonary disease, unspecified: Secondary | ICD-10-CM | POA: Diagnosis not present

## 2023-04-06 DIAGNOSIS — J9601 Acute respiratory failure with hypoxia: Secondary | ICD-10-CM | POA: Diagnosis not present

## 2023-04-06 DIAGNOSIS — J449 Chronic obstructive pulmonary disease, unspecified: Secondary | ICD-10-CM | POA: Diagnosis not present

## 2023-04-06 DIAGNOSIS — J439 Emphysema, unspecified: Secondary | ICD-10-CM | POA: Diagnosis not present

## 2023-05-07 DIAGNOSIS — J439 Emphysema, unspecified: Secondary | ICD-10-CM | POA: Diagnosis not present

## 2023-05-07 DIAGNOSIS — J9601 Acute respiratory failure with hypoxia: Secondary | ICD-10-CM | POA: Diagnosis not present

## 2023-05-07 DIAGNOSIS — J449 Chronic obstructive pulmonary disease, unspecified: Secondary | ICD-10-CM | POA: Diagnosis not present

## 2023-05-25 DIAGNOSIS — E1159 Type 2 diabetes mellitus with other circulatory complications: Secondary | ICD-10-CM | POA: Diagnosis not present

## 2023-05-25 DIAGNOSIS — I5022 Chronic systolic (congestive) heart failure: Secondary | ICD-10-CM | POA: Diagnosis not present

## 2023-05-25 DIAGNOSIS — R634 Abnormal weight loss: Secondary | ICD-10-CM | POA: Diagnosis not present

## 2023-05-25 DIAGNOSIS — E785 Hyperlipidemia, unspecified: Secondary | ICD-10-CM | POA: Diagnosis not present

## 2023-05-25 DIAGNOSIS — D509 Iron deficiency anemia, unspecified: Secondary | ICD-10-CM | POA: Diagnosis not present

## 2023-05-25 DIAGNOSIS — D638 Anemia in other chronic diseases classified elsewhere: Secondary | ICD-10-CM | POA: Diagnosis not present

## 2023-05-25 DIAGNOSIS — N1832 Chronic kidney disease, stage 3b: Secondary | ICD-10-CM | POA: Diagnosis not present

## 2023-06-06 DIAGNOSIS — J439 Emphysema, unspecified: Secondary | ICD-10-CM | POA: Diagnosis not present

## 2023-06-06 DIAGNOSIS — J449 Chronic obstructive pulmonary disease, unspecified: Secondary | ICD-10-CM | POA: Diagnosis not present

## 2023-06-06 DIAGNOSIS — J9601 Acute respiratory failure with hypoxia: Secondary | ICD-10-CM | POA: Diagnosis not present

## 2023-07-07 DIAGNOSIS — J449 Chronic obstructive pulmonary disease, unspecified: Secondary | ICD-10-CM | POA: Diagnosis not present

## 2023-07-07 DIAGNOSIS — J9601 Acute respiratory failure with hypoxia: Secondary | ICD-10-CM | POA: Diagnosis not present

## 2023-07-07 DIAGNOSIS — J439 Emphysema, unspecified: Secondary | ICD-10-CM | POA: Diagnosis not present

## 2023-07-14 ENCOUNTER — Emergency Department (HOSPITAL_BASED_OUTPATIENT_CLINIC_OR_DEPARTMENT_OTHER): Payer: Medicare Other

## 2023-07-14 ENCOUNTER — Other Ambulatory Visit: Payer: Self-pay

## 2023-07-14 ENCOUNTER — Encounter (HOSPITAL_BASED_OUTPATIENT_CLINIC_OR_DEPARTMENT_OTHER): Payer: Self-pay

## 2023-07-14 ENCOUNTER — Observation Stay (HOSPITAL_BASED_OUTPATIENT_CLINIC_OR_DEPARTMENT_OTHER)
Admission: EM | Admit: 2023-07-14 | Discharge: 2023-07-16 | Disposition: A | Discharge status: Home Health | Payer: Medicare Other | Attending: Internal Medicine | Admitting: Internal Medicine

## 2023-07-14 DIAGNOSIS — J9611 Chronic respiratory failure with hypoxia: Secondary | ICD-10-CM | POA: Diagnosis present

## 2023-07-14 DIAGNOSIS — G9341 Metabolic encephalopathy: Secondary | ICD-10-CM | POA: Diagnosis not present

## 2023-07-14 DIAGNOSIS — Z79899 Other long term (current) drug therapy: Secondary | ICD-10-CM | POA: Insufficient documentation

## 2023-07-14 DIAGNOSIS — J449 Chronic obstructive pulmonary disease, unspecified: Secondary | ICD-10-CM | POA: Insufficient documentation

## 2023-07-14 DIAGNOSIS — I5043 Acute on chronic combined systolic (congestive) and diastolic (congestive) heart failure: Secondary | ICD-10-CM | POA: Diagnosis present

## 2023-07-14 DIAGNOSIS — D638 Anemia in other chronic diseases classified elsewhere: Secondary | ICD-10-CM | POA: Diagnosis present

## 2023-07-14 DIAGNOSIS — E876 Hypokalemia: Secondary | ICD-10-CM | POA: Diagnosis present

## 2023-07-14 DIAGNOSIS — D631 Anemia in chronic kidney disease: Secondary | ICD-10-CM | POA: Diagnosis not present

## 2023-07-14 DIAGNOSIS — Z8709 Personal history of other diseases of the respiratory system: Secondary | ICD-10-CM

## 2023-07-14 DIAGNOSIS — E119 Type 2 diabetes mellitus without complications: Secondary | ICD-10-CM

## 2023-07-14 DIAGNOSIS — Z87891 Personal history of nicotine dependence: Secondary | ICD-10-CM | POA: Insufficient documentation

## 2023-07-14 DIAGNOSIS — R0902 Hypoxemia: Secondary | ICD-10-CM

## 2023-07-14 DIAGNOSIS — E1122 Type 2 diabetes mellitus with diabetic chronic kidney disease: Secondary | ICD-10-CM | POA: Diagnosis not present

## 2023-07-14 DIAGNOSIS — Z23 Encounter for immunization: Secondary | ICD-10-CM | POA: Diagnosis not present

## 2023-07-14 DIAGNOSIS — Z8546 Personal history of malignant neoplasm of prostate: Secondary | ICD-10-CM | POA: Diagnosis not present

## 2023-07-14 DIAGNOSIS — I130 Hypertensive heart and chronic kidney disease with heart failure and stage 1 through stage 4 chronic kidney disease, or unspecified chronic kidney disease: Secondary | ICD-10-CM | POA: Diagnosis not present

## 2023-07-14 DIAGNOSIS — R197 Diarrhea, unspecified: Secondary | ICD-10-CM | POA: Diagnosis not present

## 2023-07-14 DIAGNOSIS — R400 Somnolence: Secondary | ICD-10-CM | POA: Diagnosis not present

## 2023-07-14 DIAGNOSIS — I5042 Chronic combined systolic (congestive) and diastolic (congestive) heart failure: Secondary | ICD-10-CM | POA: Diagnosis present

## 2023-07-14 DIAGNOSIS — N1832 Chronic kidney disease, stage 3b: Secondary | ICD-10-CM | POA: Insufficient documentation

## 2023-07-14 DIAGNOSIS — E785 Hyperlipidemia, unspecified: Secondary | ICD-10-CM | POA: Diagnosis present

## 2023-07-14 DIAGNOSIS — I10 Essential (primary) hypertension: Secondary | ICD-10-CM | POA: Diagnosis not present

## 2023-07-14 DIAGNOSIS — Z1152 Encounter for screening for COVID-19: Secondary | ICD-10-CM | POA: Insufficient documentation

## 2023-07-14 DIAGNOSIS — I6523 Occlusion and stenosis of bilateral carotid arteries: Secondary | ICD-10-CM | POA: Diagnosis not present

## 2023-07-14 DIAGNOSIS — J9811 Atelectasis: Secondary | ICD-10-CM | POA: Diagnosis not present

## 2023-07-14 DIAGNOSIS — Z20822 Contact with and (suspected) exposure to covid-19: Secondary | ICD-10-CM | POA: Diagnosis not present

## 2023-07-14 DIAGNOSIS — Z7982 Long term (current) use of aspirin: Secondary | ICD-10-CM | POA: Diagnosis not present

## 2023-07-14 DIAGNOSIS — I6782 Cerebral ischemia: Secondary | ICD-10-CM | POA: Diagnosis not present

## 2023-07-14 DIAGNOSIS — R4182 Altered mental status, unspecified: Principal | ICD-10-CM

## 2023-07-14 DIAGNOSIS — R6883 Chills (without fever): Secondary | ICD-10-CM | POA: Diagnosis not present

## 2023-07-14 DIAGNOSIS — G9389 Other specified disorders of brain: Secondary | ICD-10-CM | POA: Diagnosis not present

## 2023-07-14 LAB — COMPREHENSIVE METABOLIC PANEL
ALT: 14 U/L (ref 0–44)
AST: 20 U/L (ref 15–41)
Albumin: 3.6 g/dL (ref 3.5–5.0)
Alkaline Phosphatase: 37 U/L — ABNORMAL LOW (ref 38–126)
Anion gap: 14 (ref 5–15)
BUN: 38 mg/dL — ABNORMAL HIGH (ref 8–23)
CO2: 26 mmol/L (ref 22–32)
Calcium: 8.8 mg/dL — ABNORMAL LOW (ref 8.9–10.3)
Chloride: 93 mmol/L — ABNORMAL LOW (ref 98–111)
Creatinine, Ser: 1.94 mg/dL — ABNORMAL HIGH (ref 0.61–1.24)
GFR, Estimated: 33 mL/min — ABNORMAL LOW (ref >60)
Glucose, Bld: 92 mg/dL (ref 70–99)
Potassium: 3.4 mmol/L — ABNORMAL LOW (ref 3.5–5.1)
Sodium: 133 mmol/L — ABNORMAL LOW (ref 135–145)
Total Bilirubin: 1.1 mg/dL (ref <1.2)
Total Protein: 7.3 g/dL (ref 6.5–8.1)

## 2023-07-14 LAB — CBC
HCT: 34.5 % — ABNORMAL LOW (ref 39.0–52.0)
Hemoglobin: 11 g/dL — ABNORMAL LOW (ref 13.0–17.0)
MCH: 29.2 pg (ref 26.0–34.0)
MCHC: 31.9 g/dL (ref 30.0–36.0)
MCV: 91.5 fL (ref 80.0–100.0)
Platelets: 185 10*3/uL (ref 150–400)
RBC: 3.77 MIL/uL — ABNORMAL LOW (ref 4.22–5.81)
RDW: 15.7 % — ABNORMAL HIGH (ref 11.5–15.5)
WBC: 9.1 10*3/uL (ref 4.0–10.5)
nRBC: 0 % (ref 0.0–0.2)

## 2023-07-14 LAB — I-STAT ARTERIAL BLOOD GAS, ED
Acid-Base Excess: 1 mmol/L (ref 0.0–2.0)
Bicarbonate: 24.9 mmol/L (ref 20.0–28.0)
Calcium, Ion: 1.17 mmol/L (ref 1.15–1.40)
HCT: 32 % — ABNORMAL LOW (ref 39.0–52.0)
Hemoglobin: 10.9 g/dL — ABNORMAL LOW (ref 13.0–17.0)
O2 Saturation: 95 %
Patient temperature: 97.8
Potassium: 3.2 mmol/L — ABNORMAL LOW (ref 3.5–5.1)
Sodium: 134 mmol/L — ABNORMAL LOW (ref 135–145)
TCO2: 26 mmol/L (ref 22–32)
pCO2 arterial: 37.4 mm[Hg] (ref 32–48)
pH, Arterial: 7.43 (ref 7.35–7.45)
pO2, Arterial: 70 mm[Hg] — ABNORMAL LOW (ref 83–108)

## 2023-07-14 LAB — RESP PANEL BY RT-PCR (RSV, FLU A&B, COVID)  RVPGX2
Influenza A by PCR: NEGATIVE
Influenza B by PCR: NEGATIVE
Resp Syncytial Virus by PCR: NEGATIVE
SARS Coronavirus 2 by RT PCR: NEGATIVE

## 2023-07-14 LAB — CBG MONITORING, ED: Glucose-Capillary: 73 mg/dL (ref 70–99)

## 2023-07-14 MED ORDER — SODIUM CHLORIDE 0.9 % IV BOLUS
1000.0000 mL | Freq: Once | INTRAVENOUS | Status: AC
  Administered 2023-07-14: 1000 mL via INTRAVENOUS

## 2023-07-14 NOTE — ED Provider Notes (Signed)
[place] [facility] Provider Note   CSN: [id number] Arrival date & time: 07/14/23  2117     History  Chief Complaint  Patient presents with   Altered Mental Status    JONTHAN SKALICKY is a 87 y.o. male.  The history is provided by the patient, a relative and medical records.  Altered Mental Status Presenting symptoms: confusion and partial responsiveness   Presenting symptoms comment:  Somnolence per family with episodes of minimally responsiveness.  Severity:  Moderate Most recent episode:  Yesterday Episode history:  Unable to specify Timing:  Intermittent Progression:  Waxing and waning Chronicity:  Recurrent Associated symptoms: decreased appetite   Associated symptoms: no abdominal pain, no agitation, no fever (chills present), no headaches, no light-headedness, no nausea, no palpitations, no rash, no vomiting and no weakness   Associated symptoms comment:  Diarrhea      Home Medications Prior to Admission medications   Medication Sig Start Date End Date Taking? Authorizing Provider  albuterol (VENTOLIN HFA) 108 (90 Base) MCG/ACT inhaler Inhale 2 puffs into the lungs every 6 (six) hours as needed for wheezing or shortness of breath.    [provider]  ASCORBIC ACID PO Take 1 tablet by mouth in the morning. Vitamin C, unknown strength    [provider]  aspirin EC 81 MG tablet Take 81 mg by mouth in the morning.    [provider]  atorvastatin (LIPITOR) 40 MG tablet Take 40 mg by mouth in the morning.    [provider]  fenofibrate 160 MG tablet Take 160 mg by mouth in the morning.    [provider]  FLOVENT HFA 110 MCG/ACT inhaler Inhale 2 puffs into the lungs 2 (two) times daily as needed (Shortness of breath). 09/09/21   [provider]  furosemide (LASIX) 40 MG tablet Take 40 mg by mouth in the morning. 12/03/21   [provider]  guaiFENesin-dextromethorphan  (ROBITUSSIN DM) 100-10 MG/5ML syrup Take 5 mLs by mouth every 6 (six) hours as needed for cough. 08/02/22   Lorin Glass, MD  hydrOXYzine (ATARAX) 25 MG tablet Take 25 mg by mouth at bedtime.    [provider]  losartan (COZAAR) 100 MG tablet Take 100 mg by mouth in the morning. 12/16/21   [provider]  MELATONIN PO Take 1 tablet by mouth at bedtime.    [provider]  Multiple Vitamins-Minerals (CENTRUM SILVER 50+MEN) TABS Take 1 tablet by mouth in the morning.    [provider]  Omega-3 Fatty Acids (FISH OIL PO) Take 1 capsule by mouth in the morning.    [provider]  OXYGEN Inhale 2 L/min into the lungs daily as needed (for shortness of breath).    [provider]      Allergies    Ace inhibitors    Review of Systems   Review of Systems  Constitutional:  Positive for chills, decreased appetite and fatigue. Negative for diaphoresis and fever (chills present).  HENT:  Negative for congestion.   Respiratory:  Positive for cough. Negative for chest tightness, shortness of breath and wheezing.   Cardiovascular:  Negative for chest pain, palpitations and leg swelling.  Gastrointestinal:  Positive for diarrhea. Negative for abdominal pain, constipation, nausea and vomiting.  Genitourinary:  Negative for dysuria and frequency.  Musculoskeletal:  Negative for back pain and neck pain.  Skin:  Negative for rash and wound.  Neurological:  Negative for dizziness, syncope, weakness,  light-headedness, numbness and headaches.  Psychiatric/Behavioral:  Positive for confusion. Negative for agitation.   All other systems reviewed and are negative.   Physical Exam Updated Vital Signs BP 136/75 (BP Location: Right Arm)   Pulse 81   Temp 97.8 F (36.6 C)   Resp 20   Ht 5\' 11"  (1.803 m)   Wt 72.6 kg   SpO2 (!) 76%   BMI 22.32 kg/m  Physical Exam Vitals and nursing note reviewed.  Constitutional:      General: He is not in acute  distress.    Appearance: He is well-developed. He is not ill-appearing, toxic-appearing or diaphoretic.  HENT:     Head: Normocephalic and atraumatic.     Nose: No congestion or rhinorrhea.     Mouth/Throat:     Mouth: Mucous membranes are moist.     Pharynx: No oropharyngeal exudate or posterior oropharyngeal erythema.  Eyes:     Extraocular Movements: Extraocular movements intact.     Conjunctiva/sclera: Conjunctivae normal.     Pupils: Pupils are equal, round, and reactive to light.  Cardiovascular:     Rate and Rhythm: Normal rate and regular rhythm.     Heart sounds: No murmur heard. Pulmonary:     Effort: Pulmonary effort is normal. No respiratory distress.     Breath sounds: Rhonchi present. No wheezing or rales.  Chest:     Chest wall: No tenderness.  Abdominal:     General: Abdomen is flat.     Palpations: Abdomen is soft.     Tenderness: There is no abdominal tenderness. There is no right CVA tenderness, left CVA tenderness, guarding or rebound.  Musculoskeletal:        General: No swelling or tenderness.     Cervical back: Neck supple. No tenderness.     Right lower leg: No edema.     Left lower leg: No edema.  Skin:    General: Skin is warm and dry.     Capillary Refill: Capillary refill takes less than 2 seconds.     Findings: No erythema or rash.  Neurological:     General: No focal deficit present.     Mental Status: He is alert.     Cranial Nerves: No cranial nerve deficit.     Sensory: No sensory deficit.     Motor: No weakness.     ED Results / Procedures / Treatments   Labs (all labs ordered are listed, but only abnormal results are displayed) Labs Reviewed  COMPREHENSIVE METABOLIC PANEL - Abnormal; Notable for the following components:      Result Value   Sodium 133 (*)    Potassium 3.4 (*)    Chloride 93 (*)    BUN 38 (*)    Creatinine, Ser 1.94 (*)    Calcium 8.8 (*)    Alkaline Phosphatase 37 (*)    GFR, Estimated 33 (*)    All other  components within normal limits  CBC - Abnormal; Notable for the following components:   RBC 3.77 (*)    Hemoglobin 11.0 (*)    HCT 34.5 (*)    RDW 15.7 (*)    All other components within normal limits  RESP PANEL BY RT-PCR (RSV, FLU A&B, COVID)  RVPGX2  URINALYSIS, ROUTINE W REFLEX MICROSCOPIC  TSH  CBG MONITORING, ED  I-STAT ARTERIAL BLOOD GAS, ED    EKG EKG Interpretation Date/Time:  Wednesday July 14 2023 21:43:55 EST Ventricular Rate:  90 PR Interval:  QRS Duration:  172 QT Interval:  453 QTC Calculation: 555 R Axis:   266  Text Interpretation: sinus rhythm Ventricular tachycardia, unsustained Nonspecific IVCD with LAD when compared to prior, significant artifact but simialr otherwise. No STEMI Confirmed by Theda Belfast ([id number]) on 07/14/2023 9:47:55 PM  Radiology DG Chest 2 View  Result Date: 07/14/2023 CLINICAL DATA:  Altered mental status and chills. EXAM: CHEST - 2 VIEW COMPARISON:  July 31, 2022 FINDINGS: The heart size and mediastinal contours are within normal limits. There is mild calcification of the aortic arch. Very mild linear atelectasis is seen within the left lung base. There is no evidence of an acute infiltrate, pleural effusion or pneumothorax. Multilevel degenerative changes seen throughout the thoracic spine. IMPRESSION: No active cardiopulmonary disease. Electronically Signed   By: Aram Candela M.D.   On: 07/14/2023 22:42    Procedures Procedures    Medications Ordered in ED Medications - No data to display  ED Course/ Medical Decision Making/ A&P                                 Medical Decision Making Amount and/or Complexity of Data Reviewed Labs: ordered. Radiology: ordered.    JIHO ECKART is a 87 y.o. male with a past medical history significant for hypertension, hyperlipidemia, diabetes, prostate cancer, AAA, carotid disease, asthma, CKD, and previous pneumonia requiring intubation and ICU stay who presents with  altered mental status, fatigue, somnolence, and cough.  According to patient's family, patient has been acting off for the last few days and has been having some coughing.  He was minimally responsive per caregiver and was having diarrhea.  No nausea or vomiting reported.  He denies any chest pain, shortness of breath but does review with cough.  Denies fevers but was having shaking chills at home.  Denies any headache neck pain or neck stiffness.  He is oriented now but per family was disoriented and confused earlier when he was on his way here.  On arrival found to be hypoxic in the 70s but on 4 L oxygen saturations down the 90s.  He does not take oxygen normally.  Denies leg pain or leg swelling.  Denies any trauma.  Denies any other complaints at this time.  Denies nausea, vomiting, constipation, or urinary changes although family said he has had UTIs in the past.  On exam, lungs had some faint coarseness in the bases.  Chest nontender.  Abdomen nontender.  No murmur.  He does have good pulse in extremities and has minimal edema in the legs.  No focal neurologic deficits initially and is oriented.  EKG appears similar to prior, do not see STEMI at this time.  Given the patient's hypoxia and altered mental status and doing better now that he is on oxygen I do anticipate admission.  Suspect some hypoxic respiratory failure leading to confusion.  Will get CT head and add a TSH onto the labs already completed.  He is negative for COVID/flu/RSV.  Is metabolic panel showed elevated creatinine similar ways in the past.  Liver function was not elevated.  Minor electrolyte abnormalities otherwise.  No leukocytosis and mild anemia.  Patient will await further chest x-ray, CT head, and urinalysis and then he will be admitted for hypoxia and altered mental status where family saying this is how he looks before he rapidly worsened and had to be intubated last year.  Anticipate admission.  Patient  will need  admission after CT head and urinalysis are completed.         Final Clinical Impression(s) / ED Diagnoses Final diagnoses:  Altered mental status, unspecified altered mental status type  Diarrhea, unspecified type  Hypoxia  Somnolence   Clinical Impression: 1. Altered mental status, unspecified altered mental status type   2. Diarrhea, unspecified type   3. Hypoxia   4. Somnolence     Disposition: Admit  This note was prepared with assistance of Dragon voice recognition software. Occasional wrong-word or sound-a-like substitutions may have occurred due to the inherent limitations of voice recognition software.       Izaih Kataoka, Canary Brim, MD 07/14/23 [phone]

## 2023-07-14 NOTE — ED Triage Notes (Signed)
Last few days pt with diarrhea, possible dehydration, and possible UTI. Family reports pt is confused and cannot answer questions appropriately. Pt eating less and sleeping a lot. Unsure if fevers at home; pt has chills.

## 2023-07-15 ENCOUNTER — Encounter (HOSPITAL_COMMUNITY): Payer: Self-pay | Admitting: Internal Medicine

## 2023-07-15 DIAGNOSIS — I130 Hypertensive heart and chronic kidney disease with heart failure and stage 1 through stage 4 chronic kidney disease, or unspecified chronic kidney disease: Secondary | ICD-10-CM | POA: Diagnosis not present

## 2023-07-15 DIAGNOSIS — J9611 Chronic respiratory failure with hypoxia: Secondary | ICD-10-CM

## 2023-07-15 DIAGNOSIS — E876 Hypokalemia: Secondary | ICD-10-CM

## 2023-07-15 DIAGNOSIS — R4182 Altered mental status, unspecified: Secondary | ICD-10-CM | POA: Insufficient documentation

## 2023-07-15 DIAGNOSIS — D638 Anemia in other chronic diseases classified elsewhere: Secondary | ICD-10-CM

## 2023-07-15 DIAGNOSIS — I5042 Chronic combined systolic (congestive) and diastolic (congestive) heart failure: Secondary | ICD-10-CM | POA: Diagnosis not present

## 2023-07-15 DIAGNOSIS — Z7982 Long term (current) use of aspirin: Secondary | ICD-10-CM | POA: Diagnosis not present

## 2023-07-15 DIAGNOSIS — Z79899 Other long term (current) drug therapy: Secondary | ICD-10-CM | POA: Diagnosis not present

## 2023-07-15 DIAGNOSIS — Z23 Encounter for immunization: Secondary | ICD-10-CM | POA: Diagnosis not present

## 2023-07-15 DIAGNOSIS — E1122 Type 2 diabetes mellitus with diabetic chronic kidney disease: Secondary | ICD-10-CM | POA: Diagnosis not present

## 2023-07-15 DIAGNOSIS — Z8709 Personal history of other diseases of the respiratory system: Secondary | ICD-10-CM | POA: Diagnosis not present

## 2023-07-15 DIAGNOSIS — E119 Type 2 diabetes mellitus without complications: Secondary | ICD-10-CM

## 2023-07-15 DIAGNOSIS — R197 Diarrhea, unspecified: Secondary | ICD-10-CM | POA: Diagnosis not present

## 2023-07-15 DIAGNOSIS — N1832 Chronic kidney disease, stage 3b: Secondary | ICD-10-CM

## 2023-07-15 DIAGNOSIS — G9341 Metabolic encephalopathy: Secondary | ICD-10-CM | POA: Diagnosis not present

## 2023-07-15 DIAGNOSIS — D631 Anemia in chronic kidney disease: Secondary | ICD-10-CM | POA: Diagnosis not present

## 2023-07-15 DIAGNOSIS — E782 Mixed hyperlipidemia: Secondary | ICD-10-CM

## 2023-07-15 DIAGNOSIS — J449 Chronic obstructive pulmonary disease, unspecified: Secondary | ICD-10-CM | POA: Diagnosis not present

## 2023-07-15 DIAGNOSIS — Z1152 Encounter for screening for COVID-19: Secondary | ICD-10-CM | POA: Diagnosis not present

## 2023-07-15 DIAGNOSIS — Z87891 Personal history of nicotine dependence: Secondary | ICD-10-CM | POA: Diagnosis not present

## 2023-07-15 DIAGNOSIS — I10 Essential (primary) hypertension: Secondary | ICD-10-CM

## 2023-07-15 DIAGNOSIS — Z8546 Personal history of malignant neoplasm of prostate: Secondary | ICD-10-CM | POA: Diagnosis not present

## 2023-07-15 DIAGNOSIS — R400 Somnolence: Secondary | ICD-10-CM | POA: Diagnosis not present

## 2023-07-15 DIAGNOSIS — I5043 Acute on chronic combined systolic (congestive) and diastolic (congestive) heart failure: Secondary | ICD-10-CM | POA: Diagnosis present

## 2023-07-15 LAB — RAPID URINE DRUG SCREEN, HOSP PERFORMED
Amphetamines: NOT DETECTED
Barbiturates: NOT DETECTED
Benzodiazepines: NOT DETECTED
Cocaine: NOT DETECTED
Opiates: NOT DETECTED
Tetrahydrocannabinol: NOT DETECTED

## 2023-07-15 LAB — CBC WITH DIFFERENTIAL/PLATELET
Abs Immature Granulocytes: 0.06 10*3/uL (ref 0.00–0.07)
Basophils Absolute: 0 10*3/uL (ref 0.0–0.1)
Basophils Relative: 0 %
Eosinophils Absolute: 0 10*3/uL (ref 0.0–0.5)
Eosinophils Relative: 0 %
HCT: 32.4 % — ABNORMAL LOW (ref 39.0–52.0)
Hemoglobin: 10 g/dL — ABNORMAL LOW (ref 13.0–17.0)
Immature Granulocytes: 1 %
Lymphocytes Relative: 11 %
Lymphs Abs: 0.8 10*3/uL (ref 0.7–4.0)
MCH: 29.6 pg (ref 26.0–34.0)
MCHC: 30.9 g/dL (ref 30.0–36.0)
MCV: 95.9 fL (ref 80.0–100.0)
Monocytes Absolute: 0.6 10*3/uL (ref 0.1–1.0)
Monocytes Relative: 8 %
Neutro Abs: 6 10*3/uL (ref 1.7–7.7)
Neutrophils Relative %: 80 %
Platelets: 166 10*3/uL (ref 150–400)
RBC: 3.38 MIL/uL — ABNORMAL LOW (ref 4.22–5.81)
RDW: 15.9 % — ABNORMAL HIGH (ref 11.5–15.5)
WBC: 7.4 10*3/uL (ref 4.0–10.5)
nRBC: 0 % (ref 0.0–0.2)

## 2023-07-15 LAB — COMPREHENSIVE METABOLIC PANEL
ALT: 13 U/L (ref 0–44)
AST: 22 U/L (ref 15–41)
Albumin: 3.3 g/dL — ABNORMAL LOW (ref 3.5–5.0)
Alkaline Phosphatase: 35 U/L — ABNORMAL LOW (ref 38–126)
Anion gap: 11 (ref 5–15)
BUN: 38 mg/dL — ABNORMAL HIGH (ref 8–23)
CO2: 26 mmol/L (ref 22–32)
Calcium: 8.4 mg/dL — ABNORMAL LOW (ref 8.9–10.3)
Chloride: 96 mmol/L — ABNORMAL LOW (ref 98–111)
Creatinine, Ser: 1.68 mg/dL — ABNORMAL HIGH (ref 0.61–1.24)
GFR, Estimated: 40 mL/min — ABNORMAL LOW (ref >60)
Glucose, Bld: 87 mg/dL (ref 70–99)
Potassium: 3.2 mmol/L — ABNORMAL LOW (ref 3.5–5.1)
Sodium: 133 mmol/L — ABNORMAL LOW (ref 135–145)
Total Bilirubin: 0.8 mg/dL (ref <1.2)
Total Protein: 6.5 g/dL (ref 6.5–8.1)

## 2023-07-15 LAB — GLUCOSE, CAPILLARY
Glucose-Capillary: 95 mg/dL (ref 70–99)
Glucose-Capillary: 121 mg/dL — ABNORMAL HIGH (ref 70–99)
Glucose-Capillary: 109 mg/dL — ABNORMAL HIGH (ref 70–99)
Glucose-Capillary: 108 mg/dL — ABNORMAL HIGH (ref 70–99)

## 2023-07-15 LAB — URINALYSIS, ROUTINE W REFLEX MICROSCOPIC
Bilirubin Urine: NEGATIVE
Glucose, UA: NEGATIVE mg/dL
Hgb urine dipstick: NEGATIVE
Ketones, ur: NEGATIVE mg/dL
Leukocytes,Ua: NEGATIVE
Nitrite: NEGATIVE
Protein, ur: NEGATIVE mg/dL
Specific Gravity, Urine: 1.014 (ref 1.005–1.030)
pH: 5 (ref 5.0–8.0)

## 2023-07-15 LAB — HEMOGLOBIN A1C
Hgb A1c MFr Bld: 5.8 % — ABNORMAL HIGH (ref 4.8–5.6)
Mean Plasma Glucose: 119.76 mg/dL

## 2023-07-15 LAB — PROCALCITONIN: Procalcitonin: 0.25 ng/mL

## 2023-07-15 LAB — VITAMIN B12: Vitamin B-12: 205 pg/mL (ref 180–914)

## 2023-07-15 LAB — PROTIME-INR
INR: 1.1 (ref 0.8–1.2)
Prothrombin Time: 14.4 s (ref 11.4–15.2)

## 2023-07-15 LAB — MAGNESIUM: Magnesium: 1.6 mg/dL — ABNORMAL LOW (ref 1.7–2.4)

## 2023-07-15 LAB — PHOSPHORUS: Phosphorus: 3.2 mg/dL (ref 2.5–4.6)

## 2023-07-15 LAB — TSH: TSH: 1.593 u[IU]/mL (ref 0.350–4.500)

## 2023-07-15 LAB — CK: Total CK: 80 U/L (ref 49–397)

## 2023-07-15 MED ORDER — ACETAMINOPHEN 325 MG PO TABS
650.0000 mg | ORAL_TABLET | Freq: Four times a day (QID) | ORAL | Status: DC | PRN
  Administered 2023-07-15: 650 mg via ORAL
  Filled 2023-07-15: qty 2

## 2023-07-15 MED ORDER — MELATONIN 3 MG PO TABS
3.0000 mg | ORAL_TABLET | Freq: Every evening | ORAL | Status: DC | PRN
  Administered 2023-07-15: 3 mg via ORAL
  Filled 2023-07-15 (×2): qty 1

## 2023-07-15 MED ORDER — POTASSIUM CHLORIDE 20 MEQ PO PACK
40.0000 meq | PACK | Freq: Once | ORAL | Status: AC
  Administered 2023-07-15: 40 meq via ORAL
  Filled 2023-07-15: qty 2

## 2023-07-15 MED ORDER — FENOFIBRATE 160 MG PO TABS
160.0000 mg | ORAL_TABLET | Freq: Every day | ORAL | Status: DC
  Administered 2023-07-15 – 2023-07-16 (×2): 160 mg via ORAL
  Filled 2023-07-15 (×2): qty 1

## 2023-07-15 MED ORDER — ALBUTEROL SULFATE (2.5 MG/3ML) 0.083% IN NEBU: 2.5000 mg | INHALATION_SOLUTION | RESPIRATORY_TRACT | Status: DC | PRN

## 2023-07-15 MED ORDER — ASPIRIN 81 MG PO TBEC
81.0000 mg | DELAYED_RELEASE_TABLET | Freq: Every day | ORAL | Status: DC
  Administered 2023-07-15 – 2023-07-16 (×2): 81 mg via ORAL
  Filled 2023-07-15 (×2): qty 1

## 2023-07-15 MED ORDER — ONDANSETRON HCL 4 MG/2ML IJ SOLN: 4.0000 mg | Freq: Four times a day (QID) | INTRAMUSCULAR | Status: DC | PRN

## 2023-07-15 MED ORDER — MAGNESIUM SULFATE 2 GM/50ML IV SOLN
2.0000 g | Freq: Once | INTRAVENOUS | Status: AC
  Administered 2023-07-15: 2 g via INTRAVENOUS
  Filled 2023-07-15: qty 50

## 2023-07-15 MED ORDER — ENSURE ENLIVE PO LIQD
237.0000 mL | Freq: Two times a day (BID) | ORAL | Status: DC
  Administered 2023-07-15 – 2023-07-16 (×3): 237 mL via ORAL

## 2023-07-15 MED ORDER — INSULIN ASPART 100 UNIT/ML IJ SOLN: 0.0000 [IU] | Freq: Three times a day (TID) | INTRAMUSCULAR | Status: DC

## 2023-07-15 MED ORDER — ATORVASTATIN CALCIUM 40 MG PO TABS
40.0000 mg | ORAL_TABLET | Freq: Every day | ORAL | Status: DC
  Administered 2023-07-15 – 2023-07-16 (×2): 40 mg via ORAL
  Filled 2023-07-15 (×2): qty 1

## 2023-07-15 MED ORDER — ACETAMINOPHEN 650 MG RE SUPP: 650.0000 mg | Freq: Four times a day (QID) | RECTAL | Status: DC | PRN

## 2023-07-15 MED ORDER — INFLUENZA VAC A&B SURF ANT ADJ 0.5 ML IM SUSY
0.5000 mL | PREFILLED_SYRINGE | INTRAMUSCULAR | Status: AC
  Administered 2023-07-16: 0.5 mL via INTRAMUSCULAR
  Filled 2023-07-15: qty 0.5

## 2023-07-15 MED ORDER — LACTATED RINGERS IV SOLN: INTRAVENOUS | Status: DC

## 2023-07-15 NOTE — ED Notes (Signed)
Report given to [facility] RN @ [facility]

## 2023-07-15 NOTE — Plan of Care (Signed)
  Problem: Education: Goal: Knowledge of General Education information will improve Description: Including pain rating scale, medication(s)/side effects and non-pharmacologic comfort measures Outcome: Not Progressing   Problem: Health Behavior/Discharge Planning: Goal: Ability to manage health-related needs will improve Outcome: Not Progressing   Problem: Clinical Measurements: Goal: Ability to maintain clinical measurements within normal limits will improve Outcome: Not Progressing Goal: Will remain free from infection Outcome: Not Progressing Goal: Diagnostic test results will improve Outcome: Not Progressing Goal: Respiratory complications will improve Outcome: Not Progressing Goal: Cardiovascular complication will be avoided Outcome: Not Progressing   Problem: Activity: Goal: Risk for activity intolerance will decrease Outcome: Not Progressing   Problem: Nutrition: Goal: Adequate nutrition will be maintained Outcome: Not Progressing   Problem: Coping: Goal: Level of anxiety will decrease Outcome: Not Progressing   Problem: Elimination: Goal: Will not experience complications related to bowel motility Outcome: Not Progressing Goal: Will not experience complications related to urinary retention Outcome: Not Progressing   Problem: Pain Management: Goal: General experience of comfort will improve Outcome: Not Progressing   Problem: Safety: Goal: Ability to remain free from injury will improve Outcome: Not Progressing   Problem: Skin Integrity: Goal: Risk for impaired skin integrity will decrease Outcome: Not Progressing   Problem: Education: Goal: Ability to describe self-care measures that may prevent or decrease complications (Diabetes Survival Skills Education) will improve Outcome: Not Progressing Goal: Individualized Educational Video(s) Outcome: Not Progressing   Problem: Coping: Goal: Ability to adjust to condition or change in health will  improve Outcome: Not Progressing   Problem: Fluid Volume: Goal: Ability to maintain a balanced intake and output will improve Outcome: Not Progressing   Problem: Health Behavior/Discharge Planning: Goal: Ability to identify and utilize available resources and services will improve Outcome: Not Progressing Goal: Ability to manage health-related needs will improve Outcome: Not Progressing   Problem: Metabolic: Goal: Ability to maintain appropriate glucose levels will improve Outcome: Not Progressing   Problem: Nutritional: Goal: Maintenance of adequate nutrition will improve Outcome: Not Progressing Goal: Progress toward achieving an optimal weight will improve Outcome: Not Progressing   Problem: Skin Integrity: Goal: Risk for impaired skin integrity will decrease Outcome: Not Progressing   Problem: Tissue Perfusion: Goal: Adequacy of tissue perfusion will improve Outcome: Not Progressing

## 2023-07-15 NOTE — Progress Notes (Signed)
Patient was seen and examined.  Admitted early morning hours.  See H&P and assessment plan done by Dr. Arlean Hopping.  I also called and discussed with his son [name] on the phone.  In brief, 87 year old who lives at home with some caretaker support, COPD and occasional oxygen use, type 2 diabetes, hypertension, hyperlipidemia, CKD stage IIIb with baseline creatinine about 2 who was brought to hospital by family for being slow than baseline, occasional confusion and somnolence.  He was also having loose watery stool 1 or 2 a day without abdominal pain or discomfort.  In the emergency room he was hemodynamically stable.  Most of his exam was unremarkable.  Renal function was at baseline.  Head CT was negative.  Chest x-ray was negative.  He did not have any evidence of infection.  Patient was given fluid boluses.  Admitted for monitoring due to significant symptoms at presentation but he was already medically improving.  Patient tells me that he feels fine and he feels himself.  He is not sure why they brought him to [facility] emergency room.  Acute metabolic encephalopathy, sleepiness and drowsiness: Likely secondary to dehydration.  He had evidence of dehydration on presentation including tachycardia and mildly elevated creatinine from baseline.  He was treated with IV fluid bolus and maintenance fluid.  Clinically improving.  Mental status back to baseline as per family.  Plan: Encourage oral intake.  Mobilize with PT OT.  He lives alone.  Ensure safe return home and anticipate return home tomorrow if no other active issues.   Same-day admit.  No charge visit.

## 2023-07-15 NOTE — Evaluation (Signed)
Physical Therapy Evaluation Patient Details Name: Harry Norris MRN: [id number] DOB: [birth year] Today's Date: 07/15/2023  History of Present Illness  VAN GAMARRA is a 87 y.o. male with medical history significant for COPD, type 2 diabetes mellitus, essential hypertension, hyperlipidemia, chronic hypoxic respiratory failure on 2 L nasal cannula nightly as well as prn,admitted to [facility] on 07/14/2023 by way of transfer from [facility] with acute encephalopathy, AMS x 2 days  Clinical Impression  Pt admitted with above diagnosis.  Pt currently with functional limitations due to the deficits listed below (see PT Problem List). Pt will benefit from acute skilled PT to increase their independence and safety with mobility to allow discharge.     The patient  reports living alone and driving, independent in ADL's. No family present. Patient oriented to [facility], not date, states here due to weakness.  Patient  required mod support  of 1  person  to move to sitting and transfer to and from [facility], incontinent of BM upon standing.   Patient will benefit from continued inpatient follow up therapy, <3 hours/day unless  has 24/7 caregivers.      If plan is discharge home, recommend the following: Two people to help with walking and/or transfers;A lot of help with bathing/dressing/bathroom;Assistance with cooking/housework;Assist for transportation;Help with stairs or ramp for entrance   Can travel by private vehicle   No    Equipment Recommendations None recommended by PT  Recommendations for Other Services       Functional Status Assessment Patient has had a recent decline in their functional status and demonstrates the ability to make significant improvements in function in a reasonable and predictable amount of time.     Precautions / Restrictions Precautions Precautions: Fall Precaution Comments: incontinent B/B Restrictions Weight Bearing Restrictions: No       Mobility  Bed Mobility Overal bed mobility: Needs Assistance Bed Mobility: Supine to Sit, Sit to Supine     Supine to sit: Mod assist Sit to supine: Mod assist   General bed mobility comments: assist with legs and trunk to move to sitting on bed edge and assist legs back onto bed.    Transfers Overall transfer level: Needs assistance Equipment used: Rolling walker (2 wheels) Transfers: Bed to chair/wheelchair/BSC, Sit to/from Stand Sit to Stand: Mod assist   Step pivot transfers: Mod assist       General transfer comment: Mod support to stand  at RW, inciontinent of BM, sat down. Assisted to stand and step to [facility] then back to bed after standing for hygiene assistance.    Ambulation/Gait                  Stairs            Wheelchair Mobility     Tilt Bed    Modified Rankin (Stroke Patients Only)       Balance Overall balance assessment: Needs assistance Sitting-balance support: Bilateral upper extremity supported, Feet supported Sitting balance-Leahy Scale: Fair     Standing balance support: Bilateral upper extremity supported, During functional activity, Reliant on assistive device for balance Standing balance-Leahy Scale: Poor Standing balance comment: patient is very shakey and trembling                             Pertinent Vitals/Pain Pain Assessment Pain Assessment: No/denies pain    Home Living Family/patient expects to be discharged to:: Private residence  Living Arrangements: Alone Available Help at Discharge: Family;Friend(s);Available PRN/intermittently Type of Home: House Home Access: Stairs to enter Entrance Stairs-Rails: Can reach both Entrance Stairs-Number of Steps: 5   Home Layout: One level Home Equipment: Rollator (4 wheels);Cane - single point;Shower Counsellor (2 wheels) Additional Comments: family not present,  pt reports some one comes in but does not stay    Prior Function                Mobility Comments: patient reports he drives, does not use a device, that he showers and cooks and shops_ no family to confirm ADLs Comments: Unsure     Extremity/Trunk Assessment   Upper Extremity Assessment Upper Extremity Assessment: Overall WFL for tasks assessed    Lower Extremity Assessment Lower Extremity Assessment: Generalized weakness    Cervical / Trunk Assessment Cervical / Trunk Assessment: Kyphotic  Communication   Communication Communication: Hearing impairment  Cognition Arousal: Alert Behavior During Therapy: WFL for tasks assessed/performed Overall Cognitive Status: No family/caregiver present to determine baseline cognitive functioning Area of Impairment: Orientation                 Orientation Level: Time, Situation                      General Comments      Exercises     Assessment/Plan    PT Assessment Patient needs continued PT services  PT Problem List Decreased strength;Decreased balance;Decreased cognition;Decreased mobility;Decreased activity tolerance       PT Treatment Interventions DME instruction;Therapeutic activities;Gait training;Therapeutic exercise;Patient/family education;Functional mobility training;Balance training    PT Goals (Current goals can be found in the Care Plan section)  Acute Rehab PT Goals Patient Stated Goal: to go home PT Goal Formulation: With patient Time For Goal Achievement: 07/29/23 Potential to Achieve Goals: Fair    Frequency Min 1X/week     Co-evaluation               AM-PAC PT "6 Clicks" Mobility  Outcome Measure Help needed turning from your back to your side while in a flat bed without using bedrails?: A Lot Help needed moving from lying on your back to sitting on the side of a flat bed without using bedrails?: A Lot Help needed moving to and from a bed to a chair (including a wheelchair)?: A Lot Help needed standing up from a chair using your arms (e.g., wheelchair or  bedside chair)?: A Lot Help needed to walk in hospital room?: Total Help needed climbing 3-5 steps with a railing? : Total 6 Click Score: 10    End of Session Equipment Utilized During Treatment: Gait belt Activity Tolerance: Patient limited by fatigue;Treatment limited secondary to medical complications (Comment) Patient left: in bed;with bed alarm set;with call bell/phone within reach Nurse Communication: Mobility status PT Visit Diagnosis: Unsteadiness on feet (R26.81);Muscle weakness (generalized) (M62.81);Difficulty in walking, not elsewhere classified (R26.2)    Time: 8119-1478 PT Time Calculation (min) (ACUTE ONLY): 34 min   Charges:   PT Evaluation $PT Eval Low Complexity: 1 Low PT Treatments $Therapeutic Activity: 8-22 mins PT General Charges $$ ACUTE PT VISIT: 1 Visit         Blanchard Kelch PT Acute Rehabilitation Services Office [phone] Weekend pager-[phone]   Rada Hay 07/15/2023, 4:46 PM

## 2023-07-15 NOTE — H&P (Addendum)
History and Physical      Harry Norris:[id number] DOB: 1935-12-11 DOA: 07/14/2023; DOS: 07/15/2023  PCP: Paulina Fusi, MD  Patient coming from: home   I have personally briefly reviewed patient's old medical records in [facility]  Chief Complaint: Altered mental status  HPI: Harry Norris is a 87 y.o. male with medical history significant for COPD, type 2 diabetes mellitus, essential hypertension, hyperlipidemia, chronic hypoxic respiratory failure on 2 L nasal cannula nightly as well as prn, chronic systolic/diastolic heart failure, CKD 3B associated with baseline creatinine range 1.6-2.0, anemia of chronic disease associated baseline hemoglobin range 9-11, who is admitted to [facility] on 07/14/2023 by way of transfer from [facility] with acute encephalopathy after presenting from home to [facility] ED for evaluation of altered mental status.   Patient's family had noted the patient to exhibit 2 days of altered mental status relative to baseline, including confusion, somnolence.  Over the last 2 days, he has had 1-2 daily episodes of loose watery stool, without any reported associated nausea, vomiting, or abdominal discomfort.  Over the last 2 days, he is reportedly exhibited evidence of diminished oral intake.  The patient himself is without any acute complaints at this time.  Medical history notable for COPD, chronic systolic/diastolic heart failure.  Per chart review, he also has a history of chronic hypoxic respiratory failure on 2 L nightly as well as prn.     [facility] ED Course:  Vital signs in the ED were notable for the following: Afebrile; heart rates in the 70s to low 100s; systolic blood pressures in the 120s to 130s; respiratory rate 14-23, oxygen saturation 95 to 100% on baseline 2 L nasal cannula.  Labs were notable for the following: I-STAT ABG notable for the following: 7.430/37 point 4/70/20 4.9.  CMP notable for the following:  Potassium 3.4, bicarbonate 26, creatinine 1.94 compared to most recent prior serum creatinine data point of 1.88 on 08/02/2022, glucose 92, calcium adjusted for mild hypoalbuminemia noted to be 9.2, avidin 3.6, otherwise, liver enzymes are within normal limits.  CBC notable for white cell count 9100, hemoglobin 11.0 compared to 9.7 on 08/02/2022.  Urinalysis has been ordered, with result currently pending.  COVID, influenza, RSV PCR were all negative.  Per my interpretation, EKG in ED demonstrated the following: Sinus rhythm with multiple PVCs, heart rate 88, no evidence of T wave or ST changes, including no evidence of ST elevation.  Imaging in the ED, per corresponding formal radiology read, was notable for the following: Noncontrast CT head showed no evidence of acute intracranial process, including no evidence of intracranial hemorrhage or areas of acute infarct.  2 view chest x-ray, showed no evidence of acute cardiopulmonary process, including no evidence of infiltrate, edema, effusion, or pneumothorax.  While in the ED, the following were administered: Normal saline x 1 L bolus, following which the patient's mental status is reported to have improved.   Subsequently, the patient was admitted to [facility] for further evaluation management of presenting acute encephalopathy, suspected to be metabolic in nature, with presenting labs also notable for hypokalemia.   Review of Systems: As per HPI otherwise 10 point review of systems negative.   Past Medical History:  Diagnosis Date   AAA (abdominal aortic aneurysm) (HCC)    Acute hypoxemic respiratory failure (HCC) 07/12/2021   Anemia    Asthma 02/07/2018   Benign neoplasm of colon    Blood in the  stool    Bradycardia 12/19/2018   Bronchitis    Cancer (HCC)    prostate   Cardiomyopathy (HCC) 12/18/2015   Carotid artery occlusion    Chronic obstructive pulmonary disease with acute exacerbation (HCC) 07/12/2021   Chronic rhinitis  02/26/2016   Chronic venous insufficiency 12/18/2015   CKD (chronic kidney disease), stage III (HCC) 07/12/2021   Community acquired pneumonia of right lower lobe of lung 07/12/2021   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   PFTs 01/18/14:  FeV1 1.58 45% FVC 2.0 50% FeV1/FVC 79% Fef 25-75 795 ( 40% improved after BD) CXR: Copd changes ONO 03/07/14 desats to <88% on RA    Diarrhea    Diverticulitis    DM type 2 (diabetes mellitus, type 2) (HCC)    patient denies every being diagnosed with diabetes   Fatigue 12/12/2018   Hyperlipidemia    Hypertension    Hypertensive heart disease    Hypoxemia 04/10/2014   Overview:  Last Assessment & Plan:  Cont nocturnal oxygen therapy 2L    Influenza 07/29/2021   Kidney stone    LBBB (left bundle branch block) 12/18/2015   Leg swelling 11/28/2013   Orthostatic hypotension 12/19/2018   Other symptoms involving cardiovascular system 11/28/2013   Pressure injury of skin 07/30/2021   Prostate cancer (HCC)    prostate    Renal insufficiency 09/26/2021   Respiratory failure (HCC) 07/29/2021   Sepsis (HCC) 07/12/2021   Syncope 12/18/2015   Varicose veins of lower extremities with other complications 11/28/2013    Past Surgical History:  Procedure Laterality Date    cardiac catheterization  12/29/2009   CATARACT EXTRACTION Bilateral    ECTROPION REPAIR Bilateral 03/27/2022   Procedure: REPAIR OF ECTROPION;  Surgeon: Dairl Ponder, MD;  Location: [facility] OR;  Service: Ophthalmology;  Laterality: Bilateral;   ENDOVENOUS ABLATION SAPHENOUS VEIN W/ LASER Right 03/29/2014   EVLA right greater saphenous vein     gold seed implants  08/31/2009   treatment of prostate cancer   HERNIA REPAIR Bilateral    LITHOTRIPSY  08/31/2008   PROSTATE SURGERY     pt reports he "think they removed" his prostate   right ear surgery      Social History:  reports that he quit smoking about 23 years ago. His smoking use included cigarettes. He started smoking about 63  years ago. He has a 100 pack-year smoking history. He has never used smokeless tobacco. He reports that he does not drink alcohol and does not use drugs.   Allergies  Allergen Reactions   Ace Inhibitors Cough    Family History  Problem Relation Age of Onset   Cancer - Colon Mother        deceased   Hypertension Mother    Diabetes Mother    Emphysema Sister        deceased   Heart disease Sister    Asthma Sister    Kidney failure Father    Heart failure Father     Family history reviewed and not pertinent    Prior to Admission medications   Medication Sig Start Date End Date Taking? Authorizing Provider  albuterol (VENTOLIN HFA) 108 (90 Base) MCG/ACT inhaler Inhale 2 puffs into the lungs every 6 (six) hours as needed for wheezing or shortness of breath.    [provider]  ASCORBIC ACID PO Take 1 tablet by mouth in the morning. Vitamin C, unknown strength    [provider]  aspirin EC 81  MG tablet Take 81 mg by mouth in the morning.    [provider]  atorvastatin (LIPITOR) 40 MG tablet Take 40 mg by mouth in the morning.    [provider]  fenofibrate 160 MG tablet Take 160 mg by mouth in the morning.    [provider]  FLOVENT HFA 110 MCG/ACT inhaler Inhale 2 puffs into the lungs 2 (two) times daily as needed (Shortness of breath). 09/09/21   [provider]  furosemide (LASIX) 40 MG tablet Take 40 mg by mouth in the morning. 12/03/21   [provider]  guaiFENesin-dextromethorphan (ROBITUSSIN DM) 100-10 MG/5ML syrup Take 5 mLs by mouth every 6 (six) hours as needed for cough. 08/02/22   Lorin Glass, MD  hydrOXYzine (ATARAX) 25 MG tablet Take 25 mg by mouth at bedtime.    [provider]  losartan (COZAAR) 100 MG tablet Take 100 mg by mouth in the morning. 12/16/21   [provider]  MELATONIN PO Take 1 tablet by mouth at bedtime.    [provider]  Multiple Vitamins-Minerals (CENTRUM  SILVER 50+MEN) TABS Take 1 tablet by mouth in the morning.    [provider]  Omega-3 Fatty Acids (FISH OIL PO) Take 1 capsule by mouth in the morning.    [provider]  OXYGEN Inhale 2 L/min into the lungs daily as needed (for shortness of breath).    [provider]     Objective    Physical Exam: Vitals:   07/14/23 2315 07/15/23 0000 07/15/23 0030 07/15/23 0100  BP: (!) 138/49 133/80 (!) 123/44 (!) 137/46  Pulse: 91 (!) 106 81 95  Resp: (!) 25 20 (!) 23 (!) 25  Temp:   97.6 F (36.4 C)   TempSrc:   Oral   SpO2: 100% 99% 97% 97%  Weight:      Height:        General: appears to be stated age; somnolent Skin: warm, dry, no rash Head:  AT/[place] Mouth:  Oral mucosa membranes appear, normal dentition Neck: supple; trachea midline Heart:  RRR; did not appreciate any M/R/G Lungs: CTAB, did not appreciate any wheezes, rales, or rhonchi Abdomen: + BS; soft, ND, NT Vascular: 2+ pedal pulses b/l; 2+ radial pulses b/l Extremities: no peripheral edema, no muscle wasting Neuro: strength and sensation intact in upper and lower extremities b/l    Labs on Admission: I have personally reviewed following labs and imaging studies  CBC: Recent Labs  Lab 07/14/23 2136 07/14/23 2340  WBC 9.1  --   HGB 11.0* 10.9*  HCT 34.5* 32.0*  MCV 91.5  --   PLT 185  --    Basic Metabolic Panel: Recent Labs  Lab 07/14/23 2136 07/14/23 2340  NA 133* 134*  K 3.4* 3.2*  CL 93*  --   CO2 26  --   GLUCOSE 92  --   BUN 38*  --   CREATININE 1.94*  --   CALCIUM 8.8*  --    GFR: Estimated Creatinine Clearance: 28.6 mL/min (A) (by C-G formula based on SCr of 1.94 mg/dL (H)). Liver Function Tests: Recent Labs  Lab 07/14/23 2136  AST 20  ALT 14  ALKPHOS 37*  BILITOT 1.1  PROT 7.3  ALBUMIN 3.6   No results for input(s): "LIPASE", "AMYLASE" in the last 168 hours. No results for input(s): "AMMONIA" in the last 168 hours. Coagulation Profile: No results for  input(s): "INR", "PROTIME" in the last 168 hours. Cardiac Enzymes:  No results for input(s): "CKTOTAL", "CKMB", "CKMBINDEX", "TROPONINI" in the last 168 hours. BNP (last 3 results) No results for input(s): "PROBNP" in the last 8760 hours. HbA1C: No results for input(s): "HGBA1C" in the last 72 hours. CBG: Recent Labs  Lab 07/14/23 2138  GLUCAP 73   Lipid Profile: No results for input(s): "CHOL", "HDL", "LDLCALC", "TRIG", "CHOLHDL", "LDLDIRECT" in the last 72 hours. Thyroid Function Tests: No results for input(s): "TSH", "T4TOTAL", "FREET4", "T3FREE", "THYROIDAB" in the last 72 hours. Anemia Panel: No results for input(s): "VITAMINB12", "FOLATE", "FERRITIN", "TIBC", "IRON", "RETICCTPCT" in the last 72 hours. Urine analysis:    Component Value Date/Time   COLORURINE YELLOW 09/26/2021 1125   APPEARANCEUR CLEAR 09/26/2021 1125   LABSPEC 1.009 09/26/2021 1125   PHURINE 5.0 09/26/2021 1125   GLUCOSEU NEGATIVE 09/26/2021 1125   HGBUR NEGATIVE 09/26/2021 1125   BILIRUBINUR NEGATIVE 09/26/2021 1125   KETONESUR NEGATIVE 09/26/2021 1125   PROTEINUR NEGATIVE 09/26/2021 1125   NITRITE NEGATIVE 09/26/2021 1125   LEUKOCYTESUR NEGATIVE 09/26/2021 1125    Radiological Exams on Admission: CT Head Wo Contrast  Result Date: 07/14/2023 CLINICAL DATA:  Altered mental status. EXAM: CT HEAD WITHOUT CONTRAST TECHNIQUE: Contiguous axial images were obtained from the base of the skull through the vertex without intravenous contrast. RADIATION DOSE REDUCTION: This exam was performed according to the departmental dose-optimization program which includes automated exposure control, adjustment of the mA and/or kV according to patient size and/or use of iterative reconstruction technique. COMPARISON:  September 26, 2021 FINDINGS: Brain: There is mild to moderate severity cerebral atrophy with widening of the extra-axial spaces and ventricular dilatation. There are areas of decreased attenuation within the white  matter tracts of the supratentorial brain, consistent with microvascular disease changes. Cavum septum pellucidum et vergae is noted. A small, chronic right basal ganglia lacunar infarct is present. Vascular: Marked severity bilateral cavernous carotid artery calcification is seen. Skull: Normal. Negative for fracture or focal lesion. Sinuses/Orbits: There are small bilateral anterior maxillary sinus polyps versus mucous retention cysts. Mild bilateral ethmoid sinus mucosal thickening is also noted. Other: None. IMPRESSION: 1. Generalized cerebral atrophy with chronic white matter small vessel ischemic changes. 2. Small, chronic right basal ganglia lacunar infarct. 3. No acute intracranial abnormality. Electronically Signed   By: Aram Candela M.D.   On: 07/14/2023 23:40   DG Chest 2 View  Result Date: 07/14/2023 CLINICAL DATA:  Altered mental status and chills. EXAM: CHEST - 2 VIEW COMPARISON:  July 31, 2022 FINDINGS: The heart size and mediastinal contours are within normal limits. There is mild calcification of the aortic arch. Very mild linear atelectasis is seen within the left lung base. There is no evidence of an acute infiltrate, pleural effusion or pneumothorax. Multilevel degenerative changes seen throughout the thoracic spine. IMPRESSION: No active cardiopulmonary disease. Electronically Signed   By: Aram Candela M.D.   On: 07/14/2023 22:42      Assessment/Plan    Principal Problem:   Acute metabolic encephalopathy Active Problems:   DM2 (diabetes mellitus, type 2) (HCC)   Hyperlipidemia   Anemia of chronic disease   CKD stage 3b, GFR 30-44 ml/min (HCC)   Essential hypertension   Hypokalemia   History of COPD   Chronic hypoxic respiratory failure (HCC)   Chronic combined systolic and diastolic heart failure (HCC)    #) Acute metabolic encephalopathy: Patient presents for evaluation of 2 days of altered mental status, confusion, somnolence relative to his baseline and  mental status activity level, with suspected metabolic  contribution from dehydration in the setting of recent episodes of loose stool concomitant with decline in oral intake, with ensuing improvement in mental status following IV fluids administered at [facility] this evening.  No obvious contributory underlying infectious process at this time, including chest x-ray which showed no evidence of acute cardiopulmonary process, including no evidence of infiltrate.  Of note, urinalysis result is currently pending.   No additional overt metabolic/electrolyte contributions at this time.  No obvious contributory pharmacologic factors. No overt acute focal neurologic deficits to suggest a contribution from an underlying acute CVA, while presenting CT head showed no evidence of acute intracranial process. Seizures are also felt to be less likely.   Of note, in the setting of a history of COPD, i-STAT ABG performed at [facility] showed no evidence of uncompensated contributory hypercapnia   Plan: fall precautions. Delirium precautions. Repeat CMP/CBC in the AM.  Follow for result of urinalysis.  Check Mg level. check TSH, B12 level.  Check CPK level, INR, urinary drug screen.  Add on procalcitonin level.  Gentle IV fluids to attend to clinical suspicion for element of dehydration.                   #) Hypokalemia: presenting potassium level noted to be 3.4.    Plan: monitor on tele. KCl 40 meq p.o. x 1 dose now. Add-on serum mag level. CMP, mag level in the AM.                 #) COPD: Documented history thereof, without clinical evidence of acute exacerbation at this time.   outpatient respiratory regimen includes the following:  Prn albuterol inhaler.   Plan: Prn albuterol nebulizer. Check CMP and serum magnesium level in the AM.  Check serum phosphorus level.                   #) Type 2 Diabetes Mellitus: documented history of such.   Appears to be managed via lifestyle modifications, in the absence of any current outpatient use of insulin or oral hypoglycemic agents.  Presenting blood sugar 92.  Most recent hemoglobin A1c was noted to be 5.8% when checked in January 2023.    Plan: accuchecks QAC and HS with low dose SSI.  Add on hemoglobin A1c level.                   #) Essential Hypertension: documented h/o such, with outpatient antihypertensive regimen including losartan, Lasix.  SBP's in the ED today: 120s 130s mmHg. in the setting of presenting clinical evidence to suggest mild dehydration, will hold home Lasix for now.  Additionally, given presenting normotensive blood pressures, will hold next dose of losartan as well.  Plan: Close monitoring of subsequent BP via routine VS. hold home losartan and Lasix for now, as above.  Monitor strict I's and O's and daily weights.                  #) Hyperlipidemia: documented h/o such. On high intensity atorvastatin in addition to fenofibrate as outpatient.  In the setting of the patient's presenting altered mental status, will also add on CPK level.  Plan: continue home statin and fenofibrate.  Add on CIWA scale level, as above.                  #) Chronic hypoxic respiratory failure: Per chart review, documented history of such, on 2 L nasal cannula nightly as  well as on a as needed basis.  Presenting oxygen saturations noted to be in the high 90s to 100% on his baseline 2 L nasal cannula.  In terms of contributing factors leading to his chronic hypoxic respiratory failure, suspect this is multifactorial, with contributions from his chronic systolic/diastolic heart failure as well as his history of underlying COPD.  Plan: Prn albuterol nebulizer.  Monitor strict I's and O's and daily weights.  Check serum magnesium and phosphorus levels.  CMP, CBC in the morning.  Add on procalcitonin  level.                        #) Chronic systolic/diastolic heart failure: documented history of such, with most recent echocardiogram performed November 2022, which showed LVEF 25 to 30%, grade 3 diastolic dysfunction, and normal right ventricular systolic function. No clinical or radiographic evidence to suggest acutely decompensated heart failure at this time. home diuretic regimen reportedly consists of the following: Lasix 40 mg p.o. daily. Home cardiac medications also include the following: Losartan.  Given clinical appearance of mild dehydration, will hold next dose of   Plan: monitor strict I's & O's and daily weights. Repeat CMP in AM. Check serum mag level.  Hold home diuretic regimen.  Hold next dose of losartan, as above.                 #) CKD Stage 3B: Documented history of such, with baseline creatinine 1.6-2.0, with presenting creatinine consistent with this baseline.    Plan: Monitor strict I's and O's and daily weights.  Attempt to avoid nephrotoxic agents.  CMP/magnesium level in the AM.                 #) Anemia of chronic disease: Documented history of such, a/w with baseline hgb range 9-11, with presenting hgb consistent with this range, in the absence of any overt evidence of active bleed.     Plan: Repeat CBC in the morning.  Check INR.       DVT prophylaxis: SCD's   Code Status: Full code (presumed in the setting of the patient's presenting altered mental status) Family Communication: none Disposition Plan: Per Rounding Team Consults called: none;  Admission status: Observation    I SPENT GREATER THAN 75  MINUTES IN CLINICAL CARE TIME/MEDICAL DECISION-MAKING IN COMPLETING THIS ADMISSION.     Chaney Born Paelyn Smick DO [facility] From 7PM - 7AM   07/15/2023, 2:13 AM

## 2023-07-15 NOTE — ED Notes (Signed)
Called [facility] for transport for [facility], No Current ETA. ED Nurse will call floor for report  Called @ 00:47

## 2023-07-15 NOTE — Plan of Care (Signed)
°  Problem: Education: Goal: Knowledge of General Education information will improve Description: Including pain rating scale, medication(s)/side effects and non-pharmacologic comfort measures Outcome: Not Progressing   Problem: Clinical Measurements: Goal: Ability to maintain clinical measurements within normal limits will improve Outcome: Not Progressing Goal: Will remain free from infection Outcome: Not Progressing Goal: Diagnostic test results will improve Outcome: Not Progressing Goal: Respiratory complications will improve Outcome: Not Progressing Goal: Cardiovascular complication will be avoided Outcome: Not Progressing   Problem: Activity: Goal: Risk for activity intolerance will decrease Outcome: Not Progressing   Problem: Nutrition: Goal: Adequate nutrition will be maintained Outcome: Not Progressing   Problem: Coping: Goal: Level of anxiety will decrease Outcome: Not Progressing

## 2023-07-15 NOTE — ED Provider Notes (Signed)
I assumed care in signout to follow-up on imaging and call report.  CT head is negative per radiology Patient is awake alert, but appears globally fatigued He has no focal motor deficits. Patient has had diarrhea and is concern for underlying dehydration Discussed the case with Dr. Margo Aye for admission   Zadie Rhine, MD 07/15/23 [phone]

## 2023-07-16 ENCOUNTER — Other Ambulatory Visit (HOSPITAL_COMMUNITY): Payer: Self-pay

## 2023-07-16 DIAGNOSIS — G9341 Metabolic encephalopathy: Secondary | ICD-10-CM | POA: Diagnosis not present

## 2023-07-16 LAB — CBC WITH DIFFERENTIAL/PLATELET
Abs Immature Granulocytes: 0.07 10*3/uL (ref 0.00–0.07)
Basophils Absolute: 0 10*3/uL (ref 0.0–0.1)
Basophils Relative: 0 %
Eosinophils Absolute: 0 10*3/uL (ref 0.0–0.5)
Eosinophils Relative: 0 %
HCT: 31.5 % — ABNORMAL LOW (ref 39.0–52.0)
Hemoglobin: 9.8 g/dL — ABNORMAL LOW (ref 13.0–17.0)
Immature Granulocytes: 1 %
Lymphocytes Relative: 18 %
Lymphs Abs: 1 10*3/uL (ref 0.7–4.0)
MCH: 29.9 pg (ref 26.0–34.0)
MCHC: 31.1 g/dL (ref 30.0–36.0)
MCV: 96 fL (ref 80.0–100.0)
Monocytes Absolute: 0.5 10*3/uL (ref 0.1–1.0)
Monocytes Relative: 9 %
Neutro Abs: 3.8 10*3/uL (ref 1.7–7.7)
Neutrophils Relative %: 72 %
Platelets: 161 10*3/uL (ref 150–400)
RBC: 3.28 MIL/uL — ABNORMAL LOW (ref 4.22–5.81)
RDW: 15.6 % — ABNORMAL HIGH (ref 11.5–15.5)
WBC: 5.4 10*3/uL (ref 4.0–10.5)
nRBC: 0 % (ref 0.0–0.2)

## 2023-07-16 LAB — BASIC METABOLIC PANEL
Anion gap: 8 (ref 5–15)
BUN: 33 mg/dL — ABNORMAL HIGH (ref 8–23)
CO2: 25 mmol/L (ref 22–32)
Calcium: 8.5 mg/dL — ABNORMAL LOW (ref 8.9–10.3)
Chloride: 99 mmol/L (ref 98–111)
Creatinine, Ser: 1.49 mg/dL — ABNORMAL HIGH (ref 0.61–1.24)
GFR, Estimated: 46 mL/min — ABNORMAL LOW (ref >60)
Glucose, Bld: 90 mg/dL (ref 70–99)
Potassium: 3.7 mmol/L (ref 3.5–5.1)
Sodium: 132 mmol/L — ABNORMAL LOW (ref 135–145)

## 2023-07-16 LAB — GLUCOSE, CAPILLARY
Glucose-Capillary: 77 mg/dL (ref 70–99)
Glucose-Capillary: 119 mg/dL — ABNORMAL HIGH (ref 70–99)

## 2023-07-16 LAB — MAGNESIUM: Magnesium: 2.2 mg/dL (ref 1.7–2.4)

## 2023-07-16 MED ORDER — ACETAMINOPHEN 325 MG PO TABS: 650.0000 mg | ORAL_TABLET | Freq: Four times a day (QID) | ORAL | 0 refills | Status: DC | PRN

## 2023-07-16 MED ORDER — ACETAMINOPHEN 325 MG PO TABS
650.0000 mg | ORAL_TABLET | Freq: Four times a day (QID) | ORAL | 0 refills | Status: AC | PRN
  Filled 2023-07-16: qty 20, 3d supply, fill #0

## 2023-07-16 MED ORDER — LOPERAMIDE HCL 2 MG PO CAPS
4.0000 mg | ORAL_CAPSULE | ORAL | Status: DC | PRN
  Administered 2023-07-16: 4 mg via ORAL
  Filled 2023-07-16: qty 2

## 2023-07-16 NOTE — Care Management Obs Status (Signed)
MEDICARE OBSERVATION STATUS NOTIFICATION   Patient Details  Name: KALIQUE DUFEK MRN: [id number] Date of Birth: [birth year]   Medicare Observation Status Notification Given:  Yes    Otelia Santee, LCSW 07/16/2023, 10:14 AM

## 2023-07-16 NOTE — Plan of Care (Signed)
  Problem: Education: Goal: Knowledge of General Education information will improve Description: Including pain rating scale, medication(s)/side effects and non-pharmacologic comfort measures Outcome: Progressing   Problem: Health Behavior/Discharge Planning: Goal: Ability to manage health-related needs will improve Outcome: Progressing   Problem: Clinical Measurements: Goal: Ability to maintain clinical measurements within normal limits will improve Outcome: Progressing Goal: Diagnostic test results will improve Outcome: Progressing   Problem: Nutrition: Goal: Adequate nutrition will be maintained Outcome: Progressing   Problem: Elimination: Goal: Will not experience complications related to bowel motility Outcome: Progressing   Problem: Coping: Goal: Ability to adjust to condition or change in health will improve Outcome: Progressing   Problem: Health Behavior/Discharge Planning: Goal: Ability to manage health-related needs will improve Outcome: Progressing

## 2023-07-16 NOTE — TOC Transition Note (Signed)
Transition of Care [facility]) - CM/SW Discharge Note   Patient Details  Name: Harry Norris MRN: [id number] Date of Birth: [birth year]  [facility] CM/SW Contact:  Otelia Santee, LCSW Phone Number: 07/16/2023, 10:56 AM   Clinical Narrative:    Pt's son would like to proceed with home health services for this pt. [name] has no preference for [facility]. HHPT/OT arranged with [facility]. Pt's son [name] to provide transportation for pt at discharge.    Final next level of care: Home w Home Health Services Barriers to Discharge: Barriers Resolved   Patient Goals and CMS Choice CMS Medicare.gov Compare Post Acute Care list provided to:: Patient Represenative (must comment) Choice offered to / list presented to : [name] POA / Guardian  Discharge Placement                         Discharge Plan and Services Additional resources added to the After Visit Summary for   In-house Referral: Clinical Social Work Discharge Planning Services: NA Post Acute Care Choice: Home Health          DME Arranged: N/A DME Agency: NA       HH Arranged: OT, PT HH Agency: [facility] Date [facility] Agency Contacted: 07/16/23 Time HH Agency Contacted: 1056 Representative spoke with at [facility] Agency: Cindie  Social Determinants of Health (SDOH) Interventions SDOH Screenings   Food Insecurity: No Food Insecurity (07/15/2023)  Housing: Low Risk  (07/15/2023)  Transportation Needs: No Transportation Needs (07/15/2023)  Utilities: Not At Risk (07/15/2023)  Depression (PHQ2-9): Low Risk  (08/07/2021)  Tobacco Use: Medium Risk (07/14/2023)     Readmission Risk Interventions    08/02/2022    2:14 PM 09/29/2021    5:07 PM  Readmission Risk Prevention Plan  Transportation Screening Complete Complete  PCP or Specialist Appt within 3-5 Days Complete   HRI or Home Care Consult Complete Not Complete  HRI or Home Care Consult comments  Pending son's decision about 24 hr homecare v/s ALF  Social  Work Consult for Recovery Care Planning/Counseling Complete Not Complete  SW consult not completed comments  Pending son's decision about 24 hr homecare v/s ALF  Palliative Care Screening Not Applicable Not Applicable  Medication Review (RN Care Manager) Complete Complete

## 2023-07-16 NOTE — Evaluation (Signed)
Occupational Therapy Evaluation Patient Details Name: Harry Norris MRN: [id number] DOB: September 27, 1935 Today's Date: 07/16/2023   History of Present Illness Harry Norris is a 87 y.o. male with medical history significant for COPD, type 2 diabetes mellitus, essential hypertension, hyperlipidemia, chronic hypoxic respiratory failure on 2 L nasal cannula nightly as well as prn,admitted to [facility] on 07/14/2023 by way of transfer from [facility] with acute encephalopathy, AMS x 2 days   Clinical Impression   Prior to this admission, patient reports living alone, independent in ADLs and IADLs, still driving. OT arriving to room with patient received attempting to get out of recliner to retrieve a blanket with chair alarm alerting. Patient is only oriented to self and hospital, states that he cooks, manages his medications and finances, and is driving. Patient is currently unable to state what medications he takes or how he manages them. Patient is min A for ADL management and min to mod A for mobility. Given current level of function patient requires 24/7 supervision if he discharges home as he at serious risk for polypharmacy, falls, and potential fire hazard. OT recommending < 3 hours of therapy at a lesser intensive facility due to current level. OT will follow.       If plan is discharge home, recommend the following: A little help with walking and/or transfers;A lot of help with bathing/dressing/bathroom;Assistance with feeding;Direct supervision/assist for medications management;Direct supervision/assist for financial management;Assist for transportation;Help with stairs or ramp for entrance;Supervision due to cognitive status    Functional Status Assessment  Patient has had a recent decline in their functional status and demonstrates the ability to make significant improvements in function in a reasonable and predictable amount of time.  Equipment Recommendations  Other  (comment) (defer to next venue)    Recommendations for Other Services       Precautions / Restrictions Precautions Precautions: Fall Precaution Comments: incontinent B/B Restrictions Weight Bearing Restrictions: No      Mobility Bed Mobility               General bed mobility comments: patient up in recliner upon OT entry    Transfers Overall transfer level: Needs assistance                 General transfer comment: Patient recieved up in standing retrieiving a blanket, min gaurd provided with patient sitting back down. Continue to reiterate importance of call bell and assist      Balance Overall balance assessment: Needs assistance Sitting-balance support: Bilateral upper extremity supported, Feet supported Sitting balance-Leahy Scale: Fair     Standing balance support: Bilateral upper extremity supported, During functional activity, Reliant on assistive device for balance Standing balance-Leahy Scale: Poor                             ADL either performed or assessed with clinical judgement   ADL Overall ADL's : Needs assistance/impaired Eating/Feeding: Set up;Sitting   Grooming: Set up;Sitting;Wash/dry hands;Wash/dry face   Upper Body Bathing: Set up;Sitting   Lower Body Bathing: Minimal assistance;Sit to/from stand;Sitting/lateral leans;Moderate assistance   Upper Body Dressing : Set up;Sitting   Lower Body Dressing: Minimal assistance;Moderate assistance;Sitting/lateral leans;Sit to/from stand   Toilet Transfer: Minimal assistance;Stand-pivot;BSC/3in1;Rolling walker (2 wheels) Toilet Transfer Details (indicate cue type and reason): simulated Toileting- Clothing Manipulation and Hygiene: Minimal assistance;Moderate assistance;Sit to/from stand;Sitting/lateral lean       Functional mobility during ADLs: Minimal  assistance;Cueing for safety;Cueing for sequencing;Rolling walker (2 wheels) General ADL Comments: Patient presenting with  pleasant confusion, and need for increased assist to complete ADLs.     Vision Baseline Vision/History: 0 No visual deficits Ability to See in Adequate Light: 0 Adequate Patient Visual Report: No change from baseline Vision Assessment?: No apparent visual deficits     Perception Perception: Not tested       Praxis Praxis: Not tested       Pertinent Vitals/Pain Pain Assessment Pain Assessment: Faces Pain Score: 0-No pain     Extremity/Trunk Assessment Upper Extremity Assessment Upper Extremity Assessment: Generalized weakness   Lower Extremity Assessment Lower Extremity Assessment: Defer to PT evaluation   Cervical / Trunk Assessment Cervical / Trunk Assessment: Kyphotic   Communication Communication Communication: Hearing impairment Cueing Techniques: Verbal cues;Tactile cues   Cognition Arousal: Alert Behavior During Therapy: Impulsive Overall Cognitive Status: No family/caregiver present to determine baseline cognitive functioning Area of Impairment: Orientation, Attention, Memory, Following commands, Awareness, Safety/judgement, Problem solving                 Orientation Level: Time, Situation Current Attention Level: Sustained Memory: Decreased short-term memory, Decreased recall of precautions Following Commands: Follows one step commands with increased time Safety/Judgement: Decreased awareness of safety, Decreased awareness of deficits Awareness: Emergent Problem Solving: Slow processing, Decreased initiation, Difficulty sequencing, Requires verbal cues, Requires tactile cues General Comments: Patient received attempting to get out of recliner to retrieve a blanket, patient endorses driving, cooking, and managing medications. Patient is only oriented to self and hospital, and unable to state what medications he takes or how he manages them.     General Comments       Exercises     Shoulder Instructions      Home Living Family/patient expects  to be discharged to:: Private residence Living Arrangements: Alone Available Help at Discharge: Family;Friend(s);Available PRN/intermittently Type of Home: House Home Access: Stairs to enter [employer] of Steps: 5 Entrance Stairs-Rails: Can reach both Home Layout: One level     Bathroom Shower/Tub: Tub/shower unit;Walk-in shower   Bathroom Toilet: Standard     Home Equipment: Rollator (4 wheels);Cane - single point;Shower Counsellor (2 wheels)   Additional Comments: family not present,  pt reports some one comes in but does not stay      Prior Functioning/Environment Prior Level of Function : Independent/Modified Independent;Driving             Mobility Comments: patient reports he drives, does not use a device, that he showers and cooks and shops_ no family to confirm ADLs Comments: Patient reports independence with all ADL and IADL management        OT Problem List: Decreased strength;Decreased activity tolerance;Impaired balance (sitting and/or standing);Decreased coordination;Decreased cognition;Decreased safety awareness;Decreased knowledge of use of DME or AE;Decreased knowledge of precautions      OT Treatment/Interventions: Self-care/ADL training;Therapeutic exercise;Energy conservation;DME and/or AE instruction;Manual therapy;Cognitive remediation/compensation;Patient/family education;Balance training    OT Goals(Current goals can be found in the care plan section) Acute Rehab OT Goals Patient Stated Goal: to go home OT Goal Formulation: With patient Time For Goal Achievement: 07/30/23 Potential to Achieve Goals: Fair  OT Frequency: Min 1X/week    Co-evaluation              AM-PAC OT "6 Clicks" Daily Activity     Outcome Measure Help from another person eating meals?: A Little Help from another person taking care of personal grooming?: A Little Help from another  person toileting, which includes using toliet, bedpan, or urinal?: A  Lot Help from another person bathing (including washing, rinsing, drying)?: A Lot Help from another person to put on and taking off regular upper body clothing?: A Little Help from another person to put on and taking off regular lower body clothing?: A Lot 6 Click Score: 15   End of Session Nurse Communication: Mobility status  Activity Tolerance: Patient tolerated treatment well Patient left: in chair;with call bell/phone within reach;with chair alarm set  OT Visit Diagnosis: Unsteadiness on feet (R26.81);Other abnormalities of gait and mobility (R26.89);Muscle weakness (generalized) (M62.81);Other symptoms and signs involving cognitive function                Time: 9147-8295 OT Time Calculation (min): 10 min Charges:  OT General Charges $OT Visit: 1 Visit OT Evaluation $OT Eval Moderate Complexity: 1 Mod  Pollyann Glen E. Ifeanyichukwu Wickham, OTR/L Acute Rehabilitation Services [phone]   Cherlyn Cushing 07/16/2023, 10:39 AM

## 2023-07-16 NOTE — TOC Initial Note (Signed)
Transition of Care [facility]) - Initial/Assessment Note    Patient Details  Name: Harry Norris MRN: [id number] Date of Birth: 1936-07-06  Transition of Care [facility] CM/SW Contact:    Otelia Santee, LCSW Phone Number: 07/16/2023, 10:17 AM  Clinical Narrative:                 Pt alert and oriented x 2. Spoke with pt's son [name] via t/c. Pt's son shares that pt lives at home alone. Pt uses cane at baseline but, has RW as well. Son states pt sleeps upright as if he lays flat he often will begin coughing or become SOB. Pt does not have CPAP or wear O2 at night however, does have home O2 concentrator and portable tanks.  Discussed recommendation for SNF placement. Pt's son shares that they have tried placement in the past however, pt becomes very agitated and verbally abusive when in hospitals/facilities and it does not tend to go well. Both of pt's son have POA of pt. [name] is to discuss recommendation with his brother prior to making a decision for SNF.      Barriers to Discharge: Continued Medical Work up   Patient Goals and CMS Choice Patient states their goals for this hospitalization and ongoing recovery are:: To increase strength CMS Medicare.gov Compare Post Acute Care list provided to:: Patient Represenative (must comment) Choice offered to / list presented to : [facility] POA / Guardian [place] ownership interest in [facility].provided to:: [facility] POA / Guardian    Expected Discharge Plan and Services In-house Referral: Clinical Social Work Discharge Planning Services: NA Post Acute Care Choice: Home Health Living arrangements for the past 2 months: Single Family Home                 DME Arranged: N/A DME Agency: NA                  Prior Living Arrangements/Services Living arrangements for the past 2 months: Single Family Home Lives with:: Self Patient language and need for interpreter reviewed:: Yes Do you feel safe going back to the place where you live?: Yes       Need for Family Participation in Patient Care: Yes (Comment) Care giver support system in place?: No (comment) Current home services: DME (Cane, RW, O2 concentrator) Criminal Activity/Legal Involvement Pertinent to Current Situation/Hospitalization: No - Comment as needed  Activities of Daily Living   ADL Screening (condition at time of admission) Independently performs ADLs?: Yes (appropriate for developmental age) Is the patient deaf or have difficulty hearing?: No Does the patient have difficulty seeing, even when wearing glasses/contacts?: No Does the patient have difficulty concentrating, remembering, or making decisions?: Yes  Permission Sought/Granted Permission sought to share information with : Family Supports, Oceanographer granted to share information with : Yes, Verbal Permission Granted              Emotional Assessment Appearance:: Appears stated age     Orientation: : Oriented to Self, Oriented to Place Alcohol / Substance Use: Not Applicable Psych Involvement: No (comment)  Admission diagnosis:  Somnolence [R40.0] Hypoxia [R09.02] Altered mental status, unspecified altered mental status type [R41.82] Diarrhea, unspecified type [R19.7] AMS (altered mental status) [R41.82] Patient Active Problem List   Diagnosis Date Noted   AMS (altered mental status) 07/15/2023   Acute metabolic encephalopathy 07/15/2023   Hypokalemia 07/15/2023   History of COPD 07/15/2023   Chronic hypoxic respiratory failure (HCC) 07/15/2023  Chronic combined systolic and diastolic heart failure (HCC) 07/15/2023   Aspiration pneumonia (HCC) 07/31/2022   Right upper lobe pulmonary nodule 07/31/2022   AKI (acute kidney injury) (HCC) 07/31/2022   Essential hypertension 03/19/2022   Anemia of chronic disease 03/18/2022   Benign neoplasm of colon 03/18/2022   Bronchitis 03/18/2022   Cancer (HCC) 03/18/2022   Hypertension 03/18/2022   Kidney stone  03/18/2022   Diarrhea    Diverticulitis    Blood in the stool    Renal insufficiency 09/26/2021   Pressure injury of skin 07/30/2021   Respiratory failure (HCC) 07/29/2021   Influenza 07/29/2021   Acute hypoxemic respiratory failure (HCC) 07/12/2021   CKD stage 3b, GFR 30-44 ml/min (HCC) 07/12/2021   Community acquired pneumonia of right lower lobe of lung 07/12/2021   Sepsis (HCC) 07/12/2021   Chronic obstructive pulmonary disease with acute exacerbation (HCC) 07/12/2021   Bradycardia 12/19/2018   Orthostatic hypotension 12/19/2018   Fatigue 12/12/2018   Hyperlipidemia 02/07/2018   Asthma 02/07/2018   Chronic rhinitis 02/26/2016   Cardiomyopathy (HCC) 12/18/2015   Chronic venous insufficiency 12/18/2015   LBBB (left bundle branch block) 12/18/2015   Syncope 12/18/2015   Hypoxemia 04/10/2014   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   Hypertensive heart disease    Prostate cancer (HCC)    AAA (abdominal aortic aneurysm) (HCC)    Carotid artery occlusion    DM2 (diabetes mellitus, type 2) (HCC)    Leg swelling 11/28/2013   Other symptoms involving cardiovascular system 11/28/2013   Varicose veins of bilateral lower extremities with other complications 11/28/2013   PCP:  Paulina Fusi, MD Pharmacy:   [id number] - [place], [place] - [address] AT [address] [place] [address] Phone: [phone] Fax: [phone]     Social Determinants of Health (SDOH) Social History: SDOH Screenings   Food Insecurity: No Food Insecurity (07/15/2023)  Housing: Low Risk  (07/15/2023)  Transportation Needs: No Transportation Needs (07/15/2023)  Utilities: Not At Risk (07/15/2023)  Depression (PHQ2-9): Low Risk  (08/07/2021)  Tobacco Use: Medium Risk (07/14/2023)   SDOH Interventions:     Readmission Risk Interventions    08/02/2022    2:14 PM 09/29/2021    5:07 PM  Readmission Risk Prevention Plan  Transportation Screening Complete  Complete  PCP or Specialist Appt within 3-5 Days Complete   HRI or Home Care Consult Complete Not Complete  HRI or Home Care Consult comments  Pending son's decision about 24 hr homecare v/s ALF  Social Work Consult for Recovery Care Planning/Counseling Complete Not Complete  SW consult not completed comments  Pending son's decision about 24 hr homecare v/s ALF  Palliative Care Screening Not Applicable Not Applicable  Medication Review (RN Care Manager) Complete Complete

## 2023-07-16 NOTE — Discharge Summary (Signed)
Physician Discharge Summary   Patient: Harry Norris MRN: [id number] DOB: [birth year]  Admit date:     07/14/2023  Discharge date: 07/16/23  Discharge Physician: Loyce Dys   PCP: Paulina Fusi, MD   Recommendations at discharge:  Follow-up with primary care physician  Discharge Diagnoses: Principal Problem:   Acute metabolic encephalopathy Active Problems:   DM2 (diabetes mellitus, type 2) (HCC)   Hyperlipidemia   Anemia of chronic disease   CKD stage 3b, GFR 30-44 ml/min (HCC)   Essential hypertension   Hypokalemia   History of COPD   Chronic hypoxic respiratory failure (HCC)   Chronic combined systolic and diastolic heart failure [facility])    Hospital Course: Harry Norris is a 87 y.o. male with medical history significant for COPD, type 2 diabetes mellitus, essential hypertension, hyperlipidemia, chronic hypoxic respiratory failure on 2 L nasal cannula nightly as well as prn, chronic systolic/diastolic heart failure, CKD 3B associated with baseline creatinine range 1.6-2.0, anemia of chronic disease associated baseline hemoglobin range 9-11, who is admitted to [facility] on 07/14/2023 by way of transfer from [facility] with acute encephalopathy after presenting from home to [facility] ED for evaluation of altered mental status.    Patient's family had noted the patient to exhibit 2 days of altered mental status relative to baseline, including confusion, somnolence.  Over the last 2 days, he has had 1-2 daily episodes of loose watery stool, without any reported associated nausea, vomiting, or abdominal discomfort.  Over the last 2 days, he is reportedly exhibited evidence of diminished oral intake.  The patient himself is without any acute complaints at this time.   During hospitalization diarrhea improved.  Mental status now resolved and back to baseline.  Patient was seen by PT OT who recommended discharge to skilled nursing facility however after discussion  with patient as well as patient's 2 sons they have made decision to take patient home with home health as patient has not done well previously at the rehab.         Consultants: None Procedures performed: None Disposition: Home health Diet recommendation:  Cardiac diet DISCHARGE MEDICATION: Allergies as of 07/16/2023       Reactions   Ace Inhibitors Cough        Medication List     STOP taking these medications    furosemide 40 MG tablet Commonly known as: LASIX   losartan 100 MG tablet Commonly known as: COZAAR   ZINC PO       TAKE these medications    acetaminophen 325 MG tablet Commonly known as: TYLENOL Take 2 tablets (650 mg total) by mouth every 6 (six) hours as needed for mild pain (pain score 1-3) (or Fever >/= 101).   albuterol 108 (90 Base) MCG/ACT inhaler Commonly known as: VENTOLIN HFA Inhale 2 puffs into the lungs every 6 (six) hours as needed for wheezing or shortness of breath.   ascorbic acid 500 MG tablet Commonly known as: VITAMIN C Take 500 mg by mouth daily.   aspirin EC 81 MG tablet Take 81 mg by mouth in the morning.   atorvastatin 40 MG tablet Commonly known as: LIPITOR Take 40 mg by mouth every evening.   Centrum Silver 50+Men Tabs Take 1 tablet by mouth in the morning.   fenofibrate 160 MG tablet Take 160 mg by mouth in the morning.   Fish Oil 1000 MG Caps Take 1,000 mg by mouth in the morning and at bedtime.  Flovent HFA 110 MCG/ACT inhaler Generic drug: fluticasone Inhale 2 puffs into the lungs 2 (two) times daily as needed (Shortness of breath).   hydrOXYzine 25 MG tablet Commonly known as: ATARAX Take 25 mg by mouth in the morning and at bedtime.   ipratropium-albuterol 0.5-2.5 (3) MG/3ML Soln Commonly known as: DUONEB Take 3 mLs by nebulization in the morning and at bedtime.   melatonin 5 MG Tabs Take 5 mg by mouth at bedtime.   OXYGEN Inhale 2.5-3 L/min into the lungs as needed (for shortness of  breath).   pantoprazole 40 MG tablet Commonly known as: PROTONIX Take 40 mg by mouth 2 (two) times daily.   vitamin E 180 MG (400 UNITS) capsule Take 400 Units by mouth daily.        Discharge Exam: Filed Weights   07/14/23 2127 07/15/23 0500 07/16/23 0454  Weight: 72.6 kg 64.4 kg 64.9 kg   General: appears to be stated age; alert and oriented Skin: warm, dry, no rash Head:  AT/[place] Mouth:  Oral mucosa membranes appear, normal dentition Neck: supple; trachea midline Heart:  RRR; did not appreciate any M/R/G Lungs: CTAB, did not appreciate any wheezes, rales, or rhonchi Abdomen: + BS; soft, ND, NT Vascular: 2+ pedal pulses b/l; 2+ radial pulses b/l Extremities: no peripheral edema, no muscle wasting Neuro: strength and sensation intact in upper and lower extremities b/l       Condition at discharge: good  The results of significant diagnostics from this hospitalization (including imaging, microbiology, ancillary and laboratory) are listed below for reference.   Imaging Studies: CT Head Wo Contrast  Result Date: 07/14/2023 CLINICAL DATA:  Altered mental status. EXAM: CT HEAD WITHOUT CONTRAST TECHNIQUE: Contiguous axial images were obtained from the base of the skull through the vertex without intravenous contrast. RADIATION DOSE REDUCTION: This exam was performed according to the departmental dose-optimization program which includes automated exposure control, adjustment of the mA and/or kV according to patient size and/or use of iterative reconstruction technique. COMPARISON:  September 26, 2021 FINDINGS: Brain: There is mild to moderate severity cerebral atrophy with widening of the extra-axial spaces and ventricular dilatation. There are areas of decreased attenuation within the white matter tracts of the supratentorial brain, consistent with microvascular disease changes. Cavum septum pellucidum et vergae is noted. A small, chronic right basal ganglia lacunar infarct is  present. Vascular: Marked severity bilateral cavernous carotid artery calcification is seen. Skull: Normal. Negative for fracture or focal lesion. Sinuses/Orbits: There are small bilateral anterior maxillary sinus polyps versus mucous retention cysts. Mild bilateral ethmoid sinus mucosal thickening is also noted. Other: None. IMPRESSION: 1. Generalized cerebral atrophy with chronic white matter small vessel ischemic changes. 2. Small, chronic right basal ganglia lacunar infarct. 3. No acute intracranial abnormality. Electronically Signed   By: Aram Candela M.D.   On: 07/14/2023 23:40   DG Chest 2 View  Result Date: 07/14/2023 CLINICAL DATA:  Altered mental status and chills. EXAM: CHEST - 2 VIEW COMPARISON:  July 31, 2022 FINDINGS: The heart size and mediastinal contours are within normal limits. There is mild calcification of the aortic arch. Very mild linear atelectasis is seen within the left lung base. There is no evidence of an acute infiltrate, pleural effusion or pneumothorax. Multilevel degenerative changes seen throughout the thoracic spine. IMPRESSION: No active cardiopulmonary disease. Electronically Signed   By: Aram Candela M.D.   On: 07/14/2023 22:42    Microbiology: Results for orders placed or performed during the hospital encounter of 07/14/23  Resp panel by RT-PCR (RSV, Flu A&B, Covid) Anterior Nasal Swab     Status: None   Collection Time: 07/14/23  9:33 PM   Specimen: Anterior Nasal Swab  Result Value Ref Range Status   SARS Coronavirus 2 by RT PCR NEGATIVE NEGATIVE Final    Comment: (NOTE) SARS-CoV-2 target nucleic acids are NOT DETECTED.  The SARS-CoV-2 RNA is generally detectable in upper respiratory specimens during the acute phase of infection. The lowest concentration of SARS-CoV-2 viral copies this assay can detect is 138 copies/mL. A negative result does not preclude SARS-Cov-2 infection and should not be used as the sole basis for treatment or other  patient management decisions. A negative result may occur with  improper specimen collection/handling, submission of specimen other than nasopharyngeal swab, presence of viral mutation(s) within the areas targeted by this assay, and inadequate number of viral copies(<138 copies/mL). A negative result must be combined with clinical observations, patient history, and epidemiological information. The expected result is Negative.  Fact Sheet for Patients:  BloggerCourse.com  Fact Sheet for Healthcare Providers:  SeriousBroker.it  This test is no t yet approved or cleared by the [place] FDA and  has been authorized for detection and/or diagnosis of SARS-CoV-2 by FDA under an Emergency Use Authorization (EUA). This EUA will remain  in effect (meaning this test can be used) for the duration of the COVID-19 declaration under Section 564(b)(1) of the Act, 21 U.S.C.section 360bbb-3(b)(1), unless the authorization is terminated  or revoked sooner.       Influenza A by PCR NEGATIVE NEGATIVE Final   Influenza B by PCR NEGATIVE NEGATIVE Final    Comment: (NOTE) The Xpert Xpress SARS-CoV-2/FLU/RSV plus assay is intended as an aid in the diagnosis of influenza from Nasopharyngeal swab specimens and should not be used as a sole basis for treatment. Nasal washings and aspirates are unacceptable for Xpert Xpress SARS-CoV-2/FLU/RSV testing.  Fact Sheet for Patients: BloggerCourse.com  Fact Sheet for Healthcare Providers: SeriousBroker.it  This test is not yet approved or cleared by the [place] FDA and has been authorized for detection and/or diagnosis of SARS-CoV-2 by FDA under an Emergency Use Authorization (EUA). This EUA will remain in effect (meaning this test can be used) for the duration of the COVID-19 declaration under Section 564(b)(1) of the Act, 21 U.S.C. section  360bbb-3(b)(1), unless the authorization is terminated or revoked.     Resp Syncytial Virus by PCR NEGATIVE NEGATIVE Final    Comment: (NOTE) Fact Sheet for Patients: BloggerCourse.com  Fact Sheet for Healthcare Providers: SeriousBroker.it  This test is not yet approved or cleared by the [place] FDA and has been authorized for detection and/or diagnosis of SARS-CoV-2 by FDA under an Emergency Use Authorization (EUA). This EUA will remain in effect (meaning this test can be used) for the duration of the COVID-19 declaration under Section 564(b)(1) of the Act, 21 U.S.C. section 360bbb-3(b)(1), unless the authorization is terminated or revoked.  Performed at [facility], [address] [place], [place] [address]     Labs: CBC: Recent Labs  Lab 07/14/23 2136 07/14/23 2340 07/15/23 0529 07/16/23 0532  WBC 9.1  --  7.4 5.4  NEUTROABS  --   --  6.0 3.8  HGB 11.0* 10.9* 10.0* 9.8*  HCT 34.5* 32.0* 32.4* 31.5*  MCV 91.5  --  95.9 96.0  PLT 185  --  166 161   Basic Metabolic Panel: Recent Labs  Lab 07/14/23 2136 07/14/23 2340 07/15/23 0529 07/16/23  0532  NA 133* 134* 133* 132*  K 3.4* 3.2* 3.2* 3.7  CL 93*  --  96* 99  CO2 26  --  26 25  GLUCOSE 92  --  87 90  BUN 38*  --  38* 33*  CREATININE 1.94*  --  1.68* 1.49*  CALCIUM 8.8*  --  8.4* 8.5*  MG  --   --  1.6* 2.2  PHOS  --   --  3.2  --    Liver Function Tests: Recent Labs  Lab 07/14/23 2136 07/15/23 0529  AST 20 22  ALT 14 13  ALKPHOS 37* 35*  BILITOT 1.1 0.8  PROT 7.3 6.5  ALBUMIN 3.6 3.3*   CBG: Recent Labs  Lab 07/15/23 0735 07/15/23 1151 07/15/23 1745 07/15/23 2131 07/16/23 0736  GLUCAP 95 121* 109* 108* 77    Discharge time spent:  35 minutes.  Signed: Loyce Dys, MD [facility] 07/16/2023

## 2023-07-16 NOTE — Plan of Care (Signed)

## 2023-07-19 ENCOUNTER — Other Ambulatory Visit (HOSPITAL_COMMUNITY): Payer: Self-pay

## 2023-07-20 DIAGNOSIS — N1832 Chronic kidney disease, stage 3b: Secondary | ICD-10-CM | POA: Diagnosis not present

## 2023-07-20 DIAGNOSIS — I5042 Chronic combined systolic (congestive) and diastolic (congestive) heart failure: Secondary | ICD-10-CM | POA: Diagnosis not present

## 2023-07-20 DIAGNOSIS — E1122 Type 2 diabetes mellitus with diabetic chronic kidney disease: Secondary | ICD-10-CM | POA: Diagnosis not present

## 2023-07-20 DIAGNOSIS — I130 Hypertensive heart and chronic kidney disease with heart failure and stage 1 through stage 4 chronic kidney disease, or unspecified chronic kidney disease: Secondary | ICD-10-CM | POA: Diagnosis not present

## 2023-07-20 DIAGNOSIS — G9341 Metabolic encephalopathy: Secondary | ICD-10-CM | POA: Diagnosis not present

## 2023-07-21 DIAGNOSIS — G9341 Metabolic encephalopathy: Secondary | ICD-10-CM | POA: Diagnosis not present

## 2023-07-21 DIAGNOSIS — R197 Diarrhea, unspecified: Secondary | ICD-10-CM | POA: Diagnosis not present

## 2023-07-21 DIAGNOSIS — E1122 Type 2 diabetes mellitus with diabetic chronic kidney disease: Secondary | ICD-10-CM | POA: Diagnosis not present

## 2023-07-21 DIAGNOSIS — I130 Hypertensive heart and chronic kidney disease with heart failure and stage 1 through stage 4 chronic kidney disease, or unspecified chronic kidney disease: Secondary | ICD-10-CM | POA: Diagnosis not present

## 2023-07-21 DIAGNOSIS — I5042 Chronic combined systolic (congestive) and diastolic (congestive) heart failure: Secondary | ICD-10-CM | POA: Diagnosis not present

## 2023-07-21 DIAGNOSIS — L89310 Pressure ulcer of right buttock, unstageable: Secondary | ICD-10-CM | POA: Diagnosis not present

## 2023-07-21 DIAGNOSIS — J9611 Chronic respiratory failure with hypoxia: Secondary | ICD-10-CM | POA: Diagnosis not present

## 2023-07-21 DIAGNOSIS — N1832 Chronic kidney disease, stage 3b: Secondary | ICD-10-CM | POA: Diagnosis not present

## 2023-07-22 DIAGNOSIS — I5042 Chronic combined systolic (congestive) and diastolic (congestive) heart failure: Secondary | ICD-10-CM | POA: Diagnosis not present

## 2023-07-22 DIAGNOSIS — G9341 Metabolic encephalopathy: Secondary | ICD-10-CM | POA: Diagnosis not present

## 2023-07-22 DIAGNOSIS — N1832 Chronic kidney disease, stage 3b: Secondary | ICD-10-CM | POA: Diagnosis not present

## 2023-07-22 DIAGNOSIS — I130 Hypertensive heart and chronic kidney disease with heart failure and stage 1 through stage 4 chronic kidney disease, or unspecified chronic kidney disease: Secondary | ICD-10-CM | POA: Diagnosis not present

## 2023-07-22 DIAGNOSIS — E1122 Type 2 diabetes mellitus with diabetic chronic kidney disease: Secondary | ICD-10-CM | POA: Diagnosis not present

## 2023-07-23 DIAGNOSIS — L89152 Pressure ulcer of sacral region, stage 2: Secondary | ICD-10-CM | POA: Diagnosis not present

## 2023-07-26 ENCOUNTER — Other Ambulatory Visit (HOSPITAL_COMMUNITY): Payer: Self-pay

## 2023-07-27 DIAGNOSIS — G9341 Metabolic encephalopathy: Secondary | ICD-10-CM | POA: Diagnosis not present

## 2023-07-27 DIAGNOSIS — N1832 Chronic kidney disease, stage 3b: Secondary | ICD-10-CM | POA: Diagnosis not present

## 2023-07-27 DIAGNOSIS — I5042 Chronic combined systolic (congestive) and diastolic (congestive) heart failure: Secondary | ICD-10-CM | POA: Diagnosis not present

## 2023-07-27 DIAGNOSIS — I130 Hypertensive heart and chronic kidney disease with heart failure and stage 1 through stage 4 chronic kidney disease, or unspecified chronic kidney disease: Secondary | ICD-10-CM | POA: Diagnosis not present

## 2023-07-27 DIAGNOSIS — E1122 Type 2 diabetes mellitus with diabetic chronic kidney disease: Secondary | ICD-10-CM | POA: Diagnosis not present

## 2023-07-28 DIAGNOSIS — G9341 Metabolic encephalopathy: Secondary | ICD-10-CM | POA: Diagnosis not present

## 2023-07-28 DIAGNOSIS — I5042 Chronic combined systolic (congestive) and diastolic (congestive) heart failure: Secondary | ICD-10-CM | POA: Diagnosis not present

## 2023-07-28 DIAGNOSIS — I130 Hypertensive heart and chronic kidney disease with heart failure and stage 1 through stage 4 chronic kidney disease, or unspecified chronic kidney disease: Secondary | ICD-10-CM | POA: Diagnosis not present

## 2023-07-28 DIAGNOSIS — N1832 Chronic kidney disease, stage 3b: Secondary | ICD-10-CM | POA: Diagnosis not present

## 2023-07-28 DIAGNOSIS — E1122 Type 2 diabetes mellitus with diabetic chronic kidney disease: Secondary | ICD-10-CM | POA: Diagnosis not present

## 2023-07-31 ENCOUNTER — Emergency Department (HOSPITAL_BASED_OUTPATIENT_CLINIC_OR_DEPARTMENT_OTHER): Payer: Medicare Other

## 2023-07-31 ENCOUNTER — Other Ambulatory Visit: Payer: Self-pay

## 2023-07-31 ENCOUNTER — Inpatient Hospital Stay (HOSPITAL_BASED_OUTPATIENT_CLINIC_OR_DEPARTMENT_OTHER)
Admission: EM | Admit: 2023-07-31 | Discharge: 2023-08-03 | DRG: 193 | Disposition: A | Discharge status: Home Health | Payer: Medicare Other | Attending: Internal Medicine | Admitting: Internal Medicine

## 2023-07-31 ENCOUNTER — Encounter (HOSPITAL_BASED_OUTPATIENT_CLINIC_OR_DEPARTMENT_OTHER): Payer: Self-pay

## 2023-07-31 DIAGNOSIS — L89326 Pressure-induced deep tissue damage of left buttock: Secondary | ICD-10-CM | POA: Diagnosis present

## 2023-07-31 DIAGNOSIS — J9611 Chronic respiratory failure with hypoxia: Secondary | ICD-10-CM | POA: Diagnosis present

## 2023-07-31 DIAGNOSIS — G9341 Metabolic encephalopathy: Secondary | ICD-10-CM | POA: Diagnosis present

## 2023-07-31 DIAGNOSIS — J189 Pneumonia, unspecified organism: Secondary | ICD-10-CM | POA: Diagnosis present

## 2023-07-31 DIAGNOSIS — Z8546 Personal history of malignant neoplasm of prostate: Secondary | ICD-10-CM

## 2023-07-31 DIAGNOSIS — R918 Other nonspecific abnormal finding of lung field: Secondary | ICD-10-CM | POA: Diagnosis not present

## 2023-07-31 DIAGNOSIS — J9601 Acute respiratory failure with hypoxia: Secondary | ICD-10-CM

## 2023-07-31 DIAGNOSIS — E876 Hypokalemia: Secondary | ICD-10-CM | POA: Diagnosis not present

## 2023-07-31 DIAGNOSIS — J9621 Acute and chronic respiratory failure with hypoxia: Secondary | ICD-10-CM | POA: Diagnosis not present

## 2023-07-31 DIAGNOSIS — Z8249 Family history of ischemic heart disease and other diseases of the circulatory system: Secondary | ICD-10-CM

## 2023-07-31 DIAGNOSIS — I5021 Acute systolic (congestive) heart failure: Secondary | ICD-10-CM | POA: Diagnosis not present

## 2023-07-31 DIAGNOSIS — C61 Malignant neoplasm of prostate: Secondary | ICD-10-CM | POA: Diagnosis present

## 2023-07-31 DIAGNOSIS — J440 Chronic obstructive pulmonary disease with acute lower respiratory infection: Secondary | ICD-10-CM | POA: Diagnosis not present

## 2023-07-31 DIAGNOSIS — Z1152 Encounter for screening for COVID-19: Secondary | ICD-10-CM

## 2023-07-31 DIAGNOSIS — R000 Tachycardia, unspecified: Secondary | ICD-10-CM | POA: Diagnosis not present

## 2023-07-31 DIAGNOSIS — Z87891 Personal history of nicotine dependence: Secondary | ICD-10-CM

## 2023-07-31 DIAGNOSIS — M7989 Other specified soft tissue disorders: Secondary | ICD-10-CM | POA: Diagnosis not present

## 2023-07-31 DIAGNOSIS — Z860100 Personal history of colon polyps, unspecified: Secondary | ICD-10-CM

## 2023-07-31 DIAGNOSIS — R29898 Other symptoms and signs involving the musculoskeletal system: Secondary | ICD-10-CM | POA: Diagnosis not present

## 2023-07-31 DIAGNOSIS — N1832 Chronic kidney disease, stage 3b: Secondary | ICD-10-CM | POA: Diagnosis present

## 2023-07-31 DIAGNOSIS — E1122 Type 2 diabetes mellitus with diabetic chronic kidney disease: Secondary | ICD-10-CM | POA: Diagnosis present

## 2023-07-31 DIAGNOSIS — J310 Chronic rhinitis: Secondary | ICD-10-CM | POA: Diagnosis not present

## 2023-07-31 DIAGNOSIS — Z841 Family history of disorders of kidney and ureter: Secondary | ICD-10-CM

## 2023-07-31 DIAGNOSIS — I5043 Acute on chronic combined systolic (congestive) and diastolic (congestive) heart failure: Secondary | ICD-10-CM | POA: Diagnosis not present

## 2023-07-31 DIAGNOSIS — I5042 Chronic combined systolic (congestive) and diastolic (congestive) heart failure: Secondary | ICD-10-CM | POA: Diagnosis present

## 2023-07-31 DIAGNOSIS — E782 Mixed hyperlipidemia: Secondary | ICD-10-CM | POA: Diagnosis not present

## 2023-07-31 DIAGNOSIS — J439 Emphysema, unspecified: Secondary | ICD-10-CM | POA: Diagnosis present

## 2023-07-31 DIAGNOSIS — E785 Hyperlipidemia, unspecified: Secondary | ICD-10-CM | POA: Diagnosis not present

## 2023-07-31 DIAGNOSIS — J470 Bronchiectasis with acute lower respiratory infection: Secondary | ICD-10-CM | POA: Diagnosis present

## 2023-07-31 DIAGNOSIS — Z9842 Cataract extraction status, left eye: Secondary | ICD-10-CM

## 2023-07-31 DIAGNOSIS — Z888 Allergy status to other drugs, medicaments and biological substances status: Secondary | ICD-10-CM

## 2023-07-31 DIAGNOSIS — Z9841 Cataract extraction status, right eye: Secondary | ICD-10-CM

## 2023-07-31 DIAGNOSIS — Y95 Nosocomial condition: Secondary | ICD-10-CM | POA: Diagnosis not present

## 2023-07-31 DIAGNOSIS — R9431 Abnormal electrocardiogram [ECG] [EKG]: Secondary | ICD-10-CM

## 2023-07-31 DIAGNOSIS — I10 Essential (primary) hypertension: Secondary | ICD-10-CM | POA: Diagnosis present

## 2023-07-31 DIAGNOSIS — Z66 Do not resuscitate: Secondary | ICD-10-CM | POA: Diagnosis present

## 2023-07-31 DIAGNOSIS — Z87442 Personal history of urinary calculi: Secondary | ICD-10-CM

## 2023-07-31 DIAGNOSIS — Z825 Family history of asthma and other chronic lower respiratory diseases: Secondary | ICD-10-CM

## 2023-07-31 DIAGNOSIS — Z79899 Other long term (current) drug therapy: Secondary | ICD-10-CM

## 2023-07-31 DIAGNOSIS — I5020 Unspecified systolic (congestive) heart failure: Secondary | ICD-10-CM

## 2023-07-31 DIAGNOSIS — I447 Left bundle-branch block, unspecified: Secondary | ICD-10-CM | POA: Diagnosis present

## 2023-07-31 DIAGNOSIS — R509 Fever, unspecified: Secondary | ICD-10-CM | POA: Diagnosis not present

## 2023-07-31 DIAGNOSIS — I517 Cardiomegaly: Secondary | ICD-10-CM | POA: Diagnosis not present

## 2023-07-31 DIAGNOSIS — E119 Type 2 diabetes mellitus without complications: Secondary | ICD-10-CM

## 2023-07-31 DIAGNOSIS — I130 Hypertensive heart and chronic kidney disease with heart failure and stage 1 through stage 4 chronic kidney disease, or unspecified chronic kidney disease: Secondary | ICD-10-CM | POA: Diagnosis not present

## 2023-07-31 DIAGNOSIS — Z833 Family history of diabetes mellitus: Secondary | ICD-10-CM

## 2023-07-31 DIAGNOSIS — Z7982 Long term (current) use of aspirin: Secondary | ICD-10-CM

## 2023-07-31 DIAGNOSIS — J90 Pleural effusion, not elsewhere classified: Secondary | ICD-10-CM | POA: Diagnosis not present

## 2023-07-31 LAB — CBC WITH DIFFERENTIAL/PLATELET
Abs Immature Granulocytes: 0.18 10*3/uL — ABNORMAL HIGH (ref 0.00–0.07)
Basophils Absolute: 0 10*3/uL (ref 0.0–0.1)
Basophils Relative: 0 %
Eosinophils Absolute: 0 10*3/uL (ref 0.0–0.5)
Eosinophils Relative: 0 %
HCT: 31.6 % — ABNORMAL LOW (ref 39.0–52.0)
Hemoglobin: 10 g/dL — ABNORMAL LOW (ref 13.0–17.0)
Immature Granulocytes: 1 %
Lymphocytes Relative: 4 %
Lymphs Abs: 0.6 10*3/uL — ABNORMAL LOW (ref 0.7–4.0)
MCH: 29 pg (ref 26.0–34.0)
MCHC: 31.6 g/dL (ref 30.0–36.0)
MCV: 91.6 fL (ref 80.0–100.0)
Monocytes Absolute: 0.4 10*3/uL (ref 0.1–1.0)
Monocytes Relative: 3 %
Neutro Abs: 12.9 10*3/uL — ABNORMAL HIGH (ref 1.7–7.7)
Neutrophils Relative %: 92 %
Platelets: 434 10*3/uL — ABNORMAL HIGH (ref 150–400)
RBC: 3.45 MIL/uL — ABNORMAL LOW (ref 4.22–5.81)
RDW: 17.3 % — ABNORMAL HIGH (ref 11.5–15.5)
WBC: 14.1 10*3/uL — ABNORMAL HIGH (ref 4.0–10.5)
nRBC: 0 % (ref 0.0–0.2)

## 2023-07-31 LAB — RESP PANEL BY RT-PCR (RSV, FLU A&B, COVID)  RVPGX2
Influenza A by PCR: NEGATIVE
Influenza B by PCR: NEGATIVE
Resp Syncytial Virus by PCR: NEGATIVE
SARS Coronavirus 2 by RT PCR: NEGATIVE

## 2023-07-31 LAB — LACTIC ACID, PLASMA: Lactic Acid, Venous: 1.1 mmol/L (ref 0.5–1.9)

## 2023-07-31 LAB — I-STAT CHEM 8, ED
BUN: 16 mg/dL (ref 8–23)
Calcium, Ion: 1.15 mmol/L (ref 1.15–1.40)
Chloride: 104 mmol/L (ref 98–111)
Creatinine, Ser: 1.3 mg/dL — ABNORMAL HIGH (ref 0.61–1.24)
Glucose, Bld: 96 mg/dL (ref 70–99)
HCT: 33 % — ABNORMAL LOW (ref 39.0–52.0)
Hemoglobin: 11.2 g/dL — ABNORMAL LOW (ref 13.0–17.0)
Potassium: 4.2 mmol/L (ref 3.5–5.1)
Sodium: 138 mmol/L (ref 135–145)
TCO2: 21 mmol/L — ABNORMAL LOW (ref 22–32)

## 2023-07-31 LAB — MRSA NEXT GEN BY PCR, NASAL: MRSA by PCR Next Gen: NOT DETECTED

## 2023-07-31 MED ORDER — GUAIFENESIN ER 600 MG PO TB12
600.0000 mg | ORAL_TABLET | Freq: Two times a day (BID) | ORAL | Status: DC
  Administered 2023-07-31 – 2023-08-03 (×6): 600 mg via ORAL
  Filled 2023-07-31 (×6): qty 1

## 2023-07-31 MED ORDER — VANCOMYCIN HCL 1.5 G IV SOLR: 1500.0000 mg | INTRAVENOUS | Status: DC

## 2023-07-31 MED ORDER — VANCOMYCIN HCL IN DEXTROSE 1-5 GM/200ML-% IV SOLN
1000.0000 mg | Freq: Once | INTRAVENOUS | Status: AC
  Administered 2023-07-31: 1000 mg via INTRAVENOUS
  Filled 2023-07-31: qty 200

## 2023-07-31 MED ORDER — BENZONATATE 100 MG PO CAPS
100.0000 mg | ORAL_CAPSULE | Freq: Three times a day (TID) | ORAL | Status: DC | PRN
  Administered 2023-08-02: 100 mg via ORAL
  Filled 2023-07-31: qty 1

## 2023-07-31 MED ORDER — VANCOMYCIN HCL 500 MG IV SOLR
500.0000 mg | Freq: Once | INTRAVENOUS | Status: AC
  Administered 2023-07-31: 500 mg via INTRAVENOUS

## 2023-07-31 MED ORDER — SODIUM CHLORIDE 0.9 % IV SOLN: 1.0000 g | Freq: Once | INTRAVENOUS | Status: DC

## 2023-07-31 MED ORDER — SODIUM CHLORIDE 0.9 % IV BOLUS
500.0000 mL | Freq: Once | INTRAVENOUS | Status: AC
  Administered 2023-07-31: 500 mL via INTRAVENOUS

## 2023-07-31 MED ORDER — FUROSEMIDE 10 MG/ML IJ SOLN
60.0000 mg | Freq: Once | INTRAMUSCULAR | Status: AC
  Administered 2023-07-31: 60 mg via INTRAVENOUS
  Filled 2023-07-31: qty 6

## 2023-07-31 MED ORDER — VANCOMYCIN HCL 1.5 G IV SOLR: 1500.0000 mg | Freq: Once | INTRAVENOUS | Status: DC

## 2023-07-31 MED ORDER — RISAQUAD PO CAPS
2.0000 | ORAL_CAPSULE | Freq: Every day | ORAL | Status: DC
  Administered 2023-08-01 – 2023-08-03 (×3): 2 via ORAL
  Filled 2023-07-31 (×3): qty 2

## 2023-07-31 MED ORDER — SODIUM CHLORIDE 0.9 % IV SOLN
2.0000 g | Freq: Two times a day (BID) | INTRAVENOUS | Status: DC
  Administered 2023-07-31 – 2023-08-03 (×6): 2 g via INTRAVENOUS
  Filled 2023-07-31 (×5): qty 12.5

## 2023-07-31 MED ORDER — ALBUTEROL SULFATE (2.5 MG/3ML) 0.083% IN NEBU: 2.5000 mg | INHALATION_SOLUTION | RESPIRATORY_TRACT | Status: DC | PRN

## 2023-07-31 MED ORDER — IPRATROPIUM-ALBUTEROL 0.5-2.5 (3) MG/3ML IN SOLN
3.0000 mL | Freq: Once | RESPIRATORY_TRACT | Status: AC
  Administered 2023-07-31: 3 mL via RESPIRATORY_TRACT
  Filled 2023-07-31: qty 3

## 2023-07-31 MED ORDER — BACID PO TABS
2.0000 | ORAL_TABLET | Freq: Three times a day (TID) | ORAL | Status: DC
  Filled 2023-07-31: qty 2

## 2023-07-31 NOTE — H&P (Incomplete)
PCP:   Paulina Fusi, MD   Chief Complaint:  Fever, cough  HPI: This is a pleasant 87 year old male with PMHx significant for COPD, type 2 diabetes mellitus, essential hypertension, hyperlipidemia, chronic hypoxic respiratory failure on 2 L nasal cannula nightly as well as prn, chronic systolic/diastolic heart failure, CKD 3B.  Patient recently admitted 11/13-11/15 with acute metabolic encephalopathy, diarrhea, physical debility.  Discharge to SNF recommended, however due to patient's wish, patient discharged home with home health, PT/OT.  Patient was doing well until yesterday when home health called son reporting sudden onset of coughing spells and patient states he did not feel well.  Temperature check 103.  Tylenol given.  Patient taken to [facility] ER.  At [facility] patient hemodynamically stable Tmax 100.8.  Initial O2 sat 86% on room air.  WBC 14.1, lactic acid 1.1, magnesium 1.5, QTc 512.  Respiratory panel negative.  CXR patchy airspace opacities bilateral lung bases right greater than left concerning for pneumonia.  Patient placed on 2 L nasal cannula.  Blood cultures collected.  Treatment initiated for hospital-acquired pneumonia with cefepime and vancomycin.  Admission requested.  Additionally patient's 2 extremity swollen R>>L.  Doppler studies done.  Son at bedside helping to provide history.  Review of Systems:  Per HPI  Past Medical History: Past Medical History:  Diagnosis Date   AAA (abdominal aortic aneurysm) (HCC)    Acute hypoxemic respiratory failure (HCC) 07/12/2021   Anemia    Asthma 02/07/2018   Benign neoplasm of colon    Blood in the stool    Bradycardia 12/19/2018   Bronchitis    Cancer (HCC)    prostate   Cardiomyopathy (HCC) 12/18/2015   Carotid artery occlusion    Chronic obstructive pulmonary disease with acute exacerbation (HCC) 07/12/2021   Chronic rhinitis 02/26/2016   Chronic venous insufficiency 12/18/2015   CKD (chronic kidney  disease), stage III (HCC) 07/12/2021   Community acquired pneumonia of right lower lobe of lung 07/12/2021   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   PFTs 01/18/14:  FeV1 1.58 45% FVC 2.0 50% FeV1/FVC 79% Fef 25-75 795 ( 40% improved after BD) CXR: Copd changes ONO 03/07/14 desats to <88% on RA    Diarrhea    Diverticulitis    DM type 2 (diabetes mellitus, type 2) (HCC)    patient denies every being diagnosed with diabetes   Fatigue 12/12/2018   Hyperlipidemia    Hypertension    Hypertensive heart disease    Hypoxemia 04/10/2014   Overview:  Last Assessment & Plan:  Cont nocturnal oxygen therapy 2L    Influenza 07/29/2021   Kidney stone    LBBB (left bundle branch block) 12/18/2015   Leg swelling 11/28/2013   Orthostatic hypotension 12/19/2018   Other symptoms involving cardiovascular system 11/28/2013   Pressure injury of skin 07/30/2021   Prostate cancer (HCC)    prostate    Renal insufficiency 09/26/2021   Respiratory failure (HCC) 07/29/2021   Sepsis (HCC) 07/12/2021   Syncope 12/18/2015   Varicose veins of lower extremities with other complications 11/28/2013   Past Surgical History:  Procedure Laterality Date    cardiac catheterization  12/29/2009   CATARACT EXTRACTION Bilateral    ECTROPION REPAIR Bilateral 03/27/2022   Procedure: REPAIR OF ECTROPION;  Surgeon: Dairl Ponder, MD;  Location: [facility] OR;  Service: Ophthalmology;  Laterality: Bilateral;   ENDOVENOUS ABLATION SAPHENOUS VEIN W/ LASER Right 03/29/2014   EVLA right greater saphenous vein     gold  seed implants  08/31/2009   treatment of prostate cancer   HERNIA REPAIR Bilateral    LITHOTRIPSY  08/31/2008   PROSTATE SURGERY     pt reports he "think they removed" his prostate   right ear surgery      Medications: Prior to Admission medications   Medication Sig Start Date End Date Taking? Authorizing Provider  acetaminophen (TYLENOL) 325 MG tablet Take 2 tablets (650 mg total) by mouth every 6 (six) hours as  needed for mild pain (pain score 1-3) (or Fever >/= 101). 07/16/23  Yes Loyce Dys, MD  albuterol (VENTOLIN HFA) 108 (90 Base) MCG/ACT inhaler Inhale 2 puffs into the lungs every 6 (six) hours as needed for wheezing or shortness of breath.   Yes [provider]  ascorbic acid (VITAMIN C) 500 MG tablet Take 500 mg by mouth daily.   Yes [provider]  aspirin EC 81 MG tablet Take 81 mg by mouth in the morning.   Yes [provider]  atorvastatin (LIPITOR) 40 MG tablet Take 40 mg by mouth every evening.   Yes [provider]  fenofibrate 160 MG tablet Take 160 mg by mouth in the morning.   Yes [provider]  FLOVENT HFA 110 MCG/ACT inhaler Inhale 2 puffs into the lungs 2 (two) times daily as needed (Shortness of breath). 09/09/21  Yes [provider]  hydrOXYzine (ATARAX) 25 MG tablet Take 25 mg by mouth in the morning and at bedtime.   Yes [provider]  ipratropium-albuterol (DUONEB) 0.5-2.5 (3) MG/3ML SOLN Take 3 mLs by nebulization in the morning and at bedtime. 06/30/23  Yes [provider]  melatonin 5 MG TABS Take 5-19 mg by mouth at bedtime.   Yes [provider]  Multiple Vitamins-Minerals (CENTRUM SILVER 50+MEN) TABS Take 1 tablet by mouth in the morning.   Yes [provider]  Omega-3 Fatty Acids (FISH OIL) 1000 MG CAPS Take 1,000 mg by mouth in the morning and at bedtime.   Yes [provider]  OXYGEN Inhale 2.5-3 L/min into the lungs as needed (for shortness of breath).   Yes [provider]  pantoprazole (PROTONIX) 40 MG tablet Take 40 mg by mouth 2 (two) times daily. 04/15/23  Yes [provider]  vitamin E 180 MG (400 UNITS) capsule Take 400 Units by mouth daily.   Yes [provider]  furosemide (LASIX) 40 MG tablet Take 40 mg by mouth daily. Patient not taking: Reported on 07/31/2023    [provider]    Allergies:   Allergies  Allergen  Reactions   Ace Inhibitors Cough    Social History:  reports that he quit smoking about 23 years ago. His smoking use included cigarettes. He started smoking about 63 years ago. He has a 100 pack-year smoking history. He has never used smokeless tobacco. He reports that he does not drink alcohol and does not use drugs.  Family History: Family History  Problem Relation Age of Onset   Cancer - Colon Mother        deceased   Hypertension Mother    Diabetes Mother    Emphysema Sister        deceased   Heart disease Sister    Asthma Sister    Kidney failure Father    Heart failure Father     Physical Exam: Vitals:   07/31/23 1832 07/31/23 1833 07/31/23 1930 07/31/23 2140  BP:   (!) 118/45 (!) 120/98  Pulse:   (!) 102 98  Resp:   19 18  Temp:    98.2 F (36.8 C)  TempSrc:    Oral  SpO2: 94% 96% 96% 95%  Weight:      Height:        General: A and O x 3, elderly, frail-appearing gentleman, coughing, no acute distress Eyes: Pink conjunctiva, no scleral icterus ENT: Moist oral mucosa, neck supple, no thyromegaly Lungs: CTA B/L,, no wheeze, no crackles, no use of accessory muscles Cardiovascular: Tachycardia, RRR,, no murmurs. No JVD Abdomen: soft, positive BS, non-tender, non-distended, no organomegaly, not an acute abdomen GU: not examined Neuro: CN II - XII grossly intact, sensation intact Musculoskeletal: strength 5/5 all extremities, B/L LE pitting R>>L.   R>>3+ pitting LLE pitting 2+ pittting Skin: no rash, no subcutaneous crepitation, no decubitus Psych: appropriate patient  Labs on Admission:  Recent Labs    07/31/23 1754  NA 138  K 4.2  CL 104  GLUCOSE 96  BUN 16  CREATININE 1.30*    Recent Labs    07/31/23 1739 07/31/23 1754  WBC 14.1*  --   NEUTROABS 12.9*  --   HGB 10.0* 11.2*  HCT 31.6* 33.0*  MCV 91.6  --   PLT 434*  --      Micro Results: Recent Results (from the past 240 hour(s))  Resp panel by RT-PCR (RSV, Flu A&B, Covid) Anterior  Nasal Swab     Status: None   Collection Time: 07/31/23  5:50 PM   Specimen: Anterior Nasal Swab  Result Value Ref Range Status   SARS Coronavirus 2 by RT PCR NEGATIVE NEGATIVE Final    Comment: (NOTE) SARS-CoV-2 target nucleic acids are NOT DETECTED.  The SARS-CoV-2 RNA is generally detectable in upper respiratory specimens during the acute phase of infection. The lowest concentration of SARS-CoV-2 viral copies this assay can detect is 138 copies/mL. A negative result does not preclude SARS-Cov-2 infection and should not be used as the sole basis for treatment or other patient management decisions. A negative result may occur with  improper specimen collection/handling, submission of specimen other than nasopharyngeal swab, presence of viral mutation(s) within the areas targeted by this assay, and inadequate number of viral copies(<138 copies/mL). A negative result must be combined with clinical observations, patient history, and epidemiological information. The expected result is Negative.  Fact Sheet for Patients:  BloggerCourse.com  Fact Sheet for Healthcare Providers:  SeriousBroker.it  This test is no t yet approved or cleared by the [place] FDA and  has been authorized for detection and/or diagnosis of SARS-CoV-2 by FDA under an Emergency Use Authorization (EUA). This EUA will remain  in effect (meaning this test can be used) for the duration of the COVID-19 declaration under Section 564(b)(1) of the Act, 21 U.S.C.section 360bbb-3(b)(1), unless the authorization is terminated  or revoked sooner.       Influenza A by PCR NEGATIVE NEGATIVE Final   Influenza B by PCR NEGATIVE NEGATIVE Final    Comment: (NOTE) The Xpert Xpress SARS-CoV-2/FLU/RSV plus assay is intended as an aid in the diagnosis of influenza from Nasopharyngeal swab specimens and should not be used as a sole basis for treatment. Nasal washings  and aspirates are unacceptable for Xpert Xpress SARS-CoV-2/FLU/RSV testing.  Fact Sheet for Patients: BloggerCourse.com  Fact Sheet for Healthcare Providers: SeriousBroker.it  This test is not yet approved or cleared by the [place] FDA and has been authorized for detection and/or diagnosis of SARS-CoV-2 by FDA  under an Emergency Use Authorization (EUA). This EUA will remain in effect (meaning this test can be used) for the duration of the COVID-19 declaration under Section 564(b)(1) of the Act, 21 U.S.C. section 360bbb-3(b)(1), unless the authorization is terminated or revoked.     Resp Syncytial Virus by PCR NEGATIVE NEGATIVE Final    Comment: (NOTE) Fact Sheet for Patients: BloggerCourse.com  Fact Sheet for Healthcare Providers: SeriousBroker.it  This test is not yet approved or cleared by the [place] FDA and has been authorized for detection and/or diagnosis of SARS-CoV-2 by FDA under an Emergency Use Authorization (EUA). This EUA will remain in effect (meaning this test can be used) for the duration of the COVID-19 declaration under Section 564(b)(1) of the Act, 21 U.S.C. section 360bbb-3(b)(1), unless the authorization is terminated or revoked.  Performed at [facility], [address] [place], [place] [address]      Radiological Exams on Admission: US Venous Img Lower Unilateral Right  Result Date: 07/31/2023 CLINICAL DATA:  Swelling. EXAM: Right LOWER EXTREMITY VENOUS DOPPLER ULTRASOUND TECHNIQUE: Gray-scale sonography with compression, as well as color and duplex ultrasound, were performed to evaluate the deep venous system(s) from the level of the common femoral vein through the popliteal and proximal calf veins. COMPARISON:  09/04/2019 FINDINGS: VENOUS Normal compressibility of the common femoral, superficial femoral, and popliteal veins.  Visualized portions of profunda femoral vein and great saphenous vein unremarkable. No filling defects to suggest DVT on grayscale or color Doppler imaging. Doppler waveforms show normal direction of venous flow, normal respiratory plasticity and response to augmentation. Limited views of the contralateral common femoral vein are unremarkable. OTHER Soft tissue edema along the calf region. Limitations: Limited evaluation of the calf vessels with the soft tissue edema. IMPRESSION: No evidence of right lower extremity DVT. Limited evaluation of the calf vessels with significant soft tissue edema as per the sonographer. Electronically Signed   By: Karen Kays M.D.   On: 07/31/2023 19:14   DG Chest Portable 1 View  Result Date: 07/31/2023 CLINICAL DATA:  Fever EXAM: PORTABLE CHEST 1 VIEW COMPARISON:  Chest x-ray 07/14/2023 FINDINGS: There are patchy airspace opacities in both lung bases, right greater than left. There is a small right pleural effusion. The heart is enlarged, unchanged. There is no pneumothorax or acute fracture. IMPRESSION: 1. Patchy airspace opacities in both lung bases, right greater than left, concerning for pneumonia. 2. Small right pleural effusion. 3. Cardiomegaly. Electronically Signed   By: Darliss Cheney M.D.   On: 07/31/2023 18:19    Assessment/Plan Present on Admission:  Pneumonia secondary to unknown organism  Acute respiratory failure with hypoxia -Pneumonia order set initiated -Cultures x 2 collected.  Strep and Legionella urinary antigen ordered -Sputum collection ordered. -Cefepime and vancomycin initiated in ER.  Rocephin/doxycycline IV continued. -Patient on 2 L oxygen, respiratory consult placed.  Wean to prehospitalization levels.  Per son patient without oxygen at home.  He does have a concentrator available.  This was his mother's who has since deceased -Probiotic ordered.  Per son patient typically gets diarrhea with antibiotics   Fluid overload //  Chronic  combined systolic and diastolic heart failure, 25-30% -IV lasix ordered, I's and O's, daily weights -Bilateral lower extremities.  Doppler RLE study negative for DVT -Lasix discontinued prior hospitalization.  Per son patient with chronic orthopnea, sleeps in chair upright for years.  Lower extremity swelling worsened since discharge. -BNP in AM ordered.  Defer to a.m. team if cardiology consult  needed -ASA resumed   CKD stage 3b, GFR 30-44 ml/min (HCC) -Improved compared to baseline.  Avoid nephrotoxic medications   Hypomagnesemia //  prolonged QTc -Replacing IV mag.  Repeat EKG in a.m.   Deconditioning -PT consult placed   Hyperlipidemia -fenofibrate, Lipitor resumed  Arien Morine 07/31/2023, 10:19 PM

## 2023-07-31 NOTE — ED Notes (Signed)
[facility] called for transport. 

## 2023-07-31 NOTE — ED Provider Notes (Signed)
[place] [facility]MENT AT [facility] Provider Note   CSN: [id number] Arrival date & time: 07/31/23  1714     History  Chief Complaint  Patient presents with   Fever    Harry Norris is a 87 y.o. male.  87 year old presenting emergency department for fevers and generalized malaise.  Family noted temperature of 104 degrees.  Patient was coughing.  Proximal to emergency department patient was saturating 86%.  Placed on oxygen.  Reports improvement of his shortness of breath and is feeling improved.  He also had Tylenol prior to arrival.   Fever      Home Medications Prior to Admission medications   Medication Sig Start Date End Date Taking? Authorizing Provider  acetaminophen (TYLENOL) 325 MG tablet Take 2 tablets (650 mg total) by mouth every 6 (six) hours as needed for mild pain (pain score 1-3) (or Fever >/= 101). 07/16/23   Loyce Dys, MD  albuterol (VENTOLIN HFA) 108 (90 Base) MCG/ACT inhaler Inhale 2 puffs into the lungs every 6 (six) hours as needed for wheezing or shortness of breath.    [provider]  ascorbic acid (VITAMIN C) 500 MG tablet Take 500 mg by mouth daily.    [provider]  aspirin EC 81 MG tablet Take 81 mg by mouth in the morning.    [provider]  atorvastatin (LIPITOR) 40 MG tablet Take 40 mg by mouth every evening.    [provider]  fenofibrate 160 MG tablet Take 160 mg by mouth in the morning.    [provider]  FLOVENT HFA 110 MCG/ACT inhaler Inhale 2 puffs into the lungs 2 (two) times daily as needed (Shortness of breath). 09/09/21   [provider]  hydrOXYzine (ATARAX) 25 MG tablet Take 25 mg by mouth in the morning and at bedtime.    [provider]  ipratropium-albuterol (DUONEB) 0.5-2.5 (3) MG/3ML SOLN Take 3 mLs by nebulization in the morning and at bedtime. 06/30/23   [provider]  melatonin 5 MG TABS Take 5 mg by mouth at bedtime.     [provider]  Multiple Vitamins-Minerals (CENTRUM SILVER 50+MEN) TABS Take 1 tablet by mouth in the morning.    [provider]  Omega-3 Fatty Acids (FISH OIL) 1000 MG CAPS Take 1,000 mg by mouth in the morning and at bedtime.    [provider]  OXYGEN Inhale 2.5-3 L/min into the lungs as needed (for shortness of breath).    [provider]  pantoprazole (PROTONIX) 40 MG tablet Take 40 mg by mouth 2 (two) times daily. 04/15/23   [provider]  vitamin E 180 MG (400 UNITS) capsule Take 400 Units by mouth daily.    [provider]      Allergies    Ace inhibitors    Review of Systems   Review of Systems  Constitutional:  Positive for fever.    Physical Exam Updated Vital Signs BP 132/63   Pulse 83   Temp (!) 100.8 F (38.2 C) (Rectal)   Resp 20   Ht 5\' 10"  (1.778 m)   Wt 64.9 kg   SpO2 96%   BMI 20.52 kg/m  Physical Exam Vitals and nursing note reviewed.  Constitutional:      General: He is not in acute distress.    Appearance: He is not toxic-appearing.  HENT:     Head: Normocephalic.     Nose: Nose normal.  Mouth/Throat:     Mouth: Mucous membranes are moist.  Eyes:     Conjunctiva/sclera: Conjunctivae normal.  Cardiovascular:     Rate and Rhythm: Normal rate and regular rhythm.  Pulmonary:     Effort: Pulmonary effort is normal.     Breath sounds: Rhonchi present. No wheezing.  Abdominal:     General: Abdomen is flat. There is no distension.     Palpations: Abdomen is soft.     Tenderness: There is no abdominal tenderness. There is no guarding or rebound.  Musculoskeletal:        General: Normal range of motion.     Right lower leg: Edema present.  Skin:    General: Skin is warm and dry.     Capillary Refill: Capillary refill takes less than 2 seconds.  Neurological:     Mental Status: He is oriented to person, place, and time.  Psychiatric:        Mood and Affect: Mood normal.        Behavior:  Behavior normal.     ED Results / Procedures / Treatments   Labs (all labs ordered are listed, but only abnormal results are displayed) Labs Reviewed  CBC WITH DIFFERENTIAL/PLATELET - Abnormal; Notable for the following components:      Result Value   WBC 14.1 (*)    RBC 3.45 (*)    Hemoglobin 10.0 (*)    HCT 31.6 (*)    RDW 17.3 (*)    Platelets 434 (*)    Neutro Abs 12.9 (*)    Lymphs Abs 0.6 (*)    Abs Immature Granulocytes 0.18 (*)    All other components within normal limits  I-STAT CHEM 8, ED - Abnormal; Notable for the following components:   Creatinine, Ser 1.30 (*)    TCO2 21 (*)    Hemoglobin 11.2 (*)    HCT 33.0 (*)    All other components within normal limits  RESP PANEL BY RT-PCR (RSV, FLU A&B, COVID)  [id number]  CULTURE, BLOOD (ROUTINE X 2)  CULTURE, BLOOD (ROUTINE X 2)  MRSA NEXT GEN BY PCR, NASAL  LACTIC ACID, PLASMA  URINALYSIS, W/ REFLEX TO CULTURE (INFECTION SUSPECTED)  CBC  BASIC METABOLIC PANEL    EKG EKG Interpretation Date/Time:  Saturday July 31 2023 17:58:23 EST Ventricular Rate:  97 PR Interval:  160 QRS Duration:  153 QT Interval:  404 QTC Calculation: 514 R Axis:   54  Text Interpretation: Sinus tachycardia Atrial premature complexes Probable left atrial enlargement LVH with secondary repolarization abnormality Anterior infarct, acute (LAD) Prolonged QT interval Confirmed by Estanislado Pandy [phone]) on 07/31/2023 6:02:54 PM  Radiology DG Chest Portable 1 View  Result Date: 07/31/2023 CLINICAL DATA:  Fever EXAM: PORTABLE CHEST 1 VIEW COMPARISON:  Chest x-ray 07/14/2023 FINDINGS: There are patchy airspace opacities in both lung bases, right greater than left. There is a small right pleural effusion. The heart is enlarged, unchanged. There is no pneumothorax or acute fracture. IMPRESSION: 1. Patchy airspace opacities in both lung bases, right greater than left, concerning for pneumonia. 2. Small right pleural effusion. 3. Cardiomegaly.  Electronically Signed   By: Darliss Cheney M.D.   On: 07/31/2023 18:19    Procedures Procedures    Medications Ordered in ED Medications  sodium chloride 0.9 % bolus 500 mL (has no administration in time range)  vancomycin (VANCOCIN) IVPB 1000 mg/200 mL premix (has no administration in time range)    Followed by  vancomycin (VANCOCIN) 500  mg in sodium chloride 0.9 % 100 mL IVPB (has no administration in time range)  ceFEPIme (MAXIPIME) 2 g in sodium chloride 0.9 % 100 mL IVPB (has no administration in time range)  ipratropium-albuterol (DUONEB) 0.5-2.5 (3) MG/3ML nebulizer solution 3 mL (3 mLs Nebulization Given 07/31/23 1826)    ED Course/ Medical Decision Making/ A&P Clinical Course as of 07/31/23 1859  Sat Jul 31, 2023  1744 Per chart recent admission at the beginning of this month : "medical history significant for COPD, type 2 diabetes mellitus, essential hypertension, hyperlipidemia, chronic hypoxic respiratory failure on 2 L nasal cannula nightly as well as prn, chronic systolic/diastolic heart failure, CKD 3B associated with baseline creatinine range 1.6-2.0, anemia of chronic disease associated baseline hemoglobin range 9-11" [TY]  1822 DG Chest Portable 1 View IMPRESSION: 1. Patchy airspace opacities in both lung bases, right greater than left, concerning for pneumonia. 2. Small right pleural effusion. 3. Cardiomegaly.   [TY]  1822 Appears patient has pneumonia.  With vital signs concerning for sepsis.  Antibiotics ordered.  Small fluid bolus as patient does have significant lower extremity edema and history of CHF with an EF of approximately 20%.  Concerned that full fluid bolus would fluid overload patient.  He is hemodynamically stable currently.  Plan for admission at this time. [TY]    Clinical Course User Index [TY] Coral Spikes, DO                                 Medical Decision Making This is a 87 year old male complex past medical history to include CHF, COPD  recent hospitalization for encephalopathy presenting emergency department with fever.  He is tachycardic as well hypoxic 86%.  Chest x-ray appeared to have pneumonia.  Started on antibiotics.  Gentle IV fluids given history of CHF.  No elevated lactate.  No significant metabolic derangements.  Blood cultures are pending.  Given his fever concern for possible viral etiology however flu/COVID RSV also negative.  Significant swelling in right lower extremity as compared to left.  Ultrasound to evaluate for DVT; independently reviewed by myself no large DVT noted.  Case discussed with hospitalist who agrees to see and admit patient.  Amount and/or Complexity of Data Reviewed Labs: ordered. Radiology: ordered. Decision-making details documented in ED Course. ECG/medicine tests: ordered.  Risk Prescription drug management. Decision regarding hospitalization.          Final Clinical Impression(s) / ED Diagnoses Final diagnoses:  Pneumonia of right lower lobe due to infectious organism    Rx / DC Orders ED Discharge Orders     None         Coral Spikes, DO 07/31/23 1859

## 2023-07-31 NOTE — ED Notes (Signed)
[facility] on the floor, report & paperwork given, belongings sent with family

## 2023-07-31 NOTE — ED Notes (Signed)
Report given to [place], RN

## 2023-07-31 NOTE — Plan of Care (Signed)
Called by Dr. Maple Hudson requesting transfer for Harry Norris.Marland Kitchen He is a 87 yo who was recently admitted earlier this month for encephalopathy. He presented with hypoxic SOB. Found to be satting in the 80s. He has a history of previous hypoxia. Labs showed leukocytosis and CXR showed infiltrates. Started on vanco and cefepime. Plan to transfer to [facility] or [facility] for admission.

## 2023-07-31 NOTE — Progress Notes (Signed)
Pharmacy Antibiotic Note  Harry Norris is a 87 y.o. male admitted on 07/31/2023 with pneumonia.  Pharmacy has been consulted for Cefepime and Vancomycin dosing.  WBC 14.1, Tmax 100.8, HR 100s SCr 1.3  Plan: Cefepime 2g IV q12h  Vancomycin 1000mg  IV x1, followed by 500mg  IV x1 for a total of 1500mg  IV loading dose, then Vancomycin 1500mg  IV q48h (eAUC ~445)    > Goal AUC 400-550    > Check vancomycin levels at steady state  Monitor daily CBC, temp, SCr, and for clinical signs of improvement  MRSA PCR F/u cultures and de-escalate antibiotics as able   Height: 5\' 10"  (177.8 cm) Weight: 64.9 kg (143 lb) IBW/kg (Calculated) : 73  Temp (24hrs), Avg:100.1 F (37.8 C), Min:99.4 F (37.4 C), Max:100.8 F (38.2 C)  Recent Labs  Lab 07/31/23 1739 07/31/23 1754  WBC 14.1*  --   CREATININE  --  1.30*  LATICACIDVEN 1.1  --     Estimated Creatinine Clearance: 38.1 mL/min (A) (by C-G formula based on SCr of 1.3 mg/dL (H)).    Allergies  Allergen Reactions   Ace Inhibitors Cough    Antimicrobials this admission: Cefepime 11/30 >>  Vancomycin 11/30 >>   Dose adjustments this admission: N/A  Microbiology results: 11/30 BCx: sent 11/30 MRSA PCR: ordered  Thank you for allowing pharmacy to be a part of this patient's care.  Wilburn Cornelia, PharmD, BCPS Clinical Pharmacist 07/31/2023 7:42 PM   Please refer to [facility] for pharmacy phone number

## 2023-07-31 NOTE — ED Triage Notes (Addendum)
Pt reports he started feeling bad today after lunch and felt like he was running a fever. He reports his thermometer was ranging from 101-104 degrees. + cough, denies any pain, denies sore throat. His home health aid gave him tylenol at 3pm. He denies any trouble breathing but initial room air sats in triage is 86%.

## 2023-08-01 ENCOUNTER — Inpatient Hospital Stay (HOSPITAL_COMMUNITY): Payer: Medicare Other

## 2023-08-01 DIAGNOSIS — Y95 Nosocomial condition: Secondary | ICD-10-CM

## 2023-08-01 DIAGNOSIS — J189 Pneumonia, unspecified organism: Secondary | ICD-10-CM | POA: Diagnosis not present

## 2023-08-01 DIAGNOSIS — I5021 Acute systolic (congestive) heart failure: Secondary | ICD-10-CM

## 2023-08-01 LAB — URINALYSIS, W/ REFLEX TO CULTURE (INFECTION SUSPECTED)
Bilirubin Urine: NEGATIVE
Glucose, UA: NEGATIVE mg/dL
Hgb urine dipstick: NEGATIVE
Ketones, ur: NEGATIVE mg/dL
Leukocytes,Ua: NEGATIVE
Nitrite: NEGATIVE
Protein, ur: NEGATIVE mg/dL
Specific Gravity, Urine: 1.011 (ref 1.005–1.030)
pH: 5 (ref 5.0–8.0)

## 2023-08-01 LAB — BASIC METABOLIC PANEL
Anion gap: 10 (ref 5–15)
BUN: 17 mg/dL (ref 8–23)
CO2: 22 mmol/L (ref 22–32)
Calcium: 8.6 mg/dL — ABNORMAL LOW (ref 8.9–10.3)
Chloride: 101 mmol/L (ref 98–111)
Creatinine, Ser: 1.42 mg/dL — ABNORMAL HIGH (ref 0.61–1.24)
GFR, Estimated: 48 mL/min — ABNORMAL LOW (ref >60)
Glucose, Bld: 96 mg/dL (ref 70–99)
Potassium: 3.8 mmol/L (ref 3.5–5.1)
Sodium: 133 mmol/L — ABNORMAL LOW (ref 135–145)

## 2023-08-01 LAB — MAGNESIUM
Magnesium: 1.5 mg/dL — ABNORMAL LOW (ref 1.7–2.4)
Magnesium: 1.9 mg/dL (ref 1.7–2.4)

## 2023-08-01 LAB — ECHOCARDIOGRAM COMPLETE
Height: 70 in
S' Lateral: 4.9 cm
Weight: 2359.8 [oz_av]

## 2023-08-01 LAB — CBC
HCT: 28.4 % — ABNORMAL LOW (ref 39.0–52.0)
Hemoglobin: 8.9 g/dL — ABNORMAL LOW (ref 13.0–17.0)
MCH: 28.5 pg (ref 26.0–34.0)
MCHC: 31.3 g/dL (ref 30.0–36.0)
MCV: 91 fL (ref 80.0–100.0)
Platelets: 367 10*3/uL (ref 150–400)
RBC: 3.12 MIL/uL — ABNORMAL LOW (ref 4.22–5.81)
RDW: 17.2 % — ABNORMAL HIGH (ref 11.5–15.5)
WBC: 16.6 10*3/uL — ABNORMAL HIGH (ref 4.0–10.5)
nRBC: 0 % (ref 0.0–0.2)

## 2023-08-01 LAB — BRAIN NATRIURETIC PEPTIDE: B Natriuretic Peptide: 2372.2 pg/mL — ABNORMAL HIGH (ref 0.0–100.0)

## 2023-08-01 LAB — PROCALCITONIN: Procalcitonin: 0.89 ng/mL

## 2023-08-01 MED ORDER — HEPARIN SODIUM (PORCINE) 5000 UNIT/ML IJ SOLN
5000.0000 [IU] | Freq: Three times a day (TID) | INTRAMUSCULAR | Status: DC
  Administered 2023-08-01 – 2023-08-03 (×6): 5000 [IU] via SUBCUTANEOUS
  Filled 2023-08-01 (×6): qty 1

## 2023-08-01 MED ORDER — ATORVASTATIN CALCIUM 40 MG PO TABS
40.0000 mg | ORAL_TABLET | Freq: Every evening | ORAL | Status: DC
  Administered 2023-08-01 – 2023-08-02 (×2): 40 mg via ORAL
  Filled 2023-08-01 (×2): qty 1

## 2023-08-01 MED ORDER — ASPIRIN 81 MG PO TBEC
81.0000 mg | DELAYED_RELEASE_TABLET | Freq: Every morning | ORAL | Status: DC
  Administered 2023-08-01 – 2023-08-03 (×3): 81 mg via ORAL
  Filled 2023-08-01 (×3): qty 1

## 2023-08-01 MED ORDER — FUROSEMIDE 10 MG/ML IJ SOLN
40.0000 mg | Freq: Two times a day (BID) | INTRAMUSCULAR | Status: DC
  Administered 2023-08-01 – 2023-08-02 (×4): 40 mg via INTRAVENOUS
  Filled 2023-08-01 (×4): qty 4

## 2023-08-01 MED ORDER — ONDANSETRON HCL 4 MG/2ML IJ SOLN
4.0000 mg | Freq: Four times a day (QID) | INTRAMUSCULAR | Status: DC | PRN
  Administered 2023-08-01: 4 mg via INTRAVENOUS
  Filled 2023-08-01: qty 2

## 2023-08-01 MED ORDER — MAGNESIUM SULFATE 2 GM/50ML IV SOLN
2.0000 g | Freq: Once | INTRAVENOUS | Status: AC
  Administered 2023-08-01: 2 g via INTRAVENOUS
  Filled 2023-08-01: qty 50

## 2023-08-01 MED ORDER — PERFLUTREN LIPID MICROSPHERE
1.0000 mL | INTRAVENOUS | Status: AC | PRN
  Administered 2023-08-01: 3 mL via INTRAVENOUS

## 2023-08-01 MED ORDER — ORAL CARE MOUTH RINSE: 15.0000 mL | OROMUCOSAL | Status: DC | PRN

## 2023-08-01 MED ORDER — ACETAMINOPHEN 500 MG PO TABS
1000.0000 mg | ORAL_TABLET | Freq: Once | ORAL | Status: AC
  Administered 2023-08-01: 1000 mg via ORAL
  Filled 2023-08-01: qty 2

## 2023-08-01 MED ORDER — FENOFIBRATE 160 MG PO TABS
160.0000 mg | ORAL_TABLET | Freq: Every morning | ORAL | Status: DC
Start: 2023-08-01 — End: 2023-08-03
  Administered 2023-08-01 – 2023-08-02 (×2): 160 mg via ORAL
  Filled 2023-08-01 (×3): qty 1

## 2023-08-01 NOTE — Evaluation (Signed)
Physical Therapy Evaluation Patient Details Name: Harry Norris MRN: [id number] DOB: 1936-08-07 Today's Date: 08/01/2023  History of Present Illness  87 y.o. male presents to [facility] 07/31/23 w/ coughing spells. Admitted w/ PNA and fluid overload, negative R LE DVT study. Recent admit 11/13-11/15 w/ acute metabolic encephalopathy and diarrhea. PMHx: DM, COPD, chronic hypoxic respiratory failure on 2L [place] PRN, cardiomyopathy EF 35%, HLD, HTN, HLD, prostate cancer.   Clinical Impression  Pt in bed upon arrival and agreeable to PT eval. Prior to admit, pt was ambulating either w/ no AD or RW independently. In today's session, pt was able to ambulate ~120 ft with CGA for safety. Pt would have increased steadiness with RW, however, pt prefers to use no AD. Pt reports being close to functional mobility baseline. Pt presents to therapy session with decreased balance and mobility. Pt would benefit from acute skilled PT to address functional impairments. Recommending post-acute HHPT, however, pt declines. Acute PT to follow.          If plan is discharge home, recommend the following: A little help with walking and/or transfers;Help with stairs or ramp for entrance;Assist for transportation   Can travel by private vehicle   Yes    Equipment Recommendations None recommended by PT     Functional Status Assessment Patient has had a recent decline in their functional status and demonstrates the ability to make significant improvements in function in a reasonable and predictable amount of time.     Precautions / Restrictions Precautions Precautions: Fall Precaution Comments: incontinent B/B Restrictions Weight Bearing Restrictions: No      Mobility  Bed Mobility Overal bed mobility: Needs Assistance Bed Mobility: Supine to Sit, Sit to Supine     Supine to sit: Supervision Sit to supine: Supervision   General bed mobility comments: supervision for safety    Transfers Overall transfer  level: Needs assistance Equipment used: None Transfers: Sit to/from Stand Sit to Stand: Contact guard assist      General transfer comment: CGA for safety    Ambulation/Gait Ambulation/Gait assistance: Contact guard assist Gait Distance (Feet): 120 Feet Assistive device: None Gait Pattern/deviations: Step-through pattern, Shuffle, Wide base of support Gait velocity: dec     General Gait Details: increased medial/lateral sway w/ short steps. No LOB with gait or turns        Balance Overall balance assessment: Needs assistance Sitting-balance support: No upper extremity supported, Feet supported Sitting balance-Leahy Scale: Good     Standing balance support: No upper extremity supported, During functional activity Standing balance-Leahy Scale: Good Standing balance comment: able to stand and ambulate w/ no AD, would have increased steadiness with RW       Pertinent Vitals/Pain Pain Assessment Pain Assessment: No/denies pain    Home Living Family/patient expects to be discharged to:: Private residence Living Arrangements: Alone Available Help at Discharge: Family;Friend(s);Available PRN/intermittently Type of Home: House Home Access: Stairs to enter Entrance Stairs-Rails: Can reach both Entrance Stairs-Number of Steps: 5   Home Layout: One level Home Equipment: Rollator (4 wheels);Cane - single point;Shower Counsellor (2 wheels)      Prior Function Prior Level of Function : Independent/Modified Independent;Driving    Mobility Comments: uses RW occasionally but prefers to use no AD ADLs Comments: reports independence with all ADL/IADL     Extremity/Trunk Assessment   Upper Extremity Assessment Upper Extremity Assessment: Defer to OT evaluation    Lower Extremity Assessment Lower Extremity Assessment: Overall WFL for tasks assessed    Cervical /  Trunk Assessment Cervical / Trunk Assessment: Kyphotic  Communication    Communication Communication: Hearing impairment  Cognition Arousal: Alert Behavior During Therapy: WFL for tasks assessed/performed Overall Cognitive Status: Within Functional Limits for tasks assessed     General Comments General comments (skin integrity, edema, etc.): VSS on RA     PT Assessment Patient needs continued PT services  PT Problem List Decreased strength;Decreased balance;Decreased cognition;Decreased mobility;Decreased activity tolerance       PT Treatment Interventions DME instruction;Therapeutic activities;Gait training;Therapeutic exercise;Patient/family education;Functional mobility training;Balance training    PT Goals (Current goals can be found in the Care Plan section)  Acute Rehab PT Goals Patient Stated Goal: to go home PT Goal Formulation: With patient Time For Goal Achievement: 08/15/23 Potential to Achieve Goals: Good    Frequency Min 1X/week        AM-PAC PT "6 Clicks" Mobility  Outcome Measure Help needed turning from your back to your side while in a flat bed without using bedrails?: None Help needed moving from lying on your back to sitting on the side of a flat bed without using bedrails?: A Little Help needed moving to and from a bed to a chair (including a wheelchair)?: A Little Help needed standing up from a chair using your arms (e.g., wheelchair or bedside chair)?: A Little Help needed to walk in hospital room?: A Little Help needed climbing 3-5 steps with a railing? : A Little 6 Click Score: 19    End of Session Equipment Utilized During Treatment: Gait belt Activity Tolerance: Patient tolerated treatment well Patient left: in bed;with bed alarm set;with call bell/phone within reach Nurse Communication: Mobility status PT Visit Diagnosis: Unsteadiness on feet (R26.81);Muscle weakness (generalized) (M62.81);Difficulty in walking, not elsewhere classified (R26.2)    Time: 4010-2725 PT Time Calculation (min) (ACUTE ONLY): 13  min   Charges:   PT Evaluation $PT Eval Low Complexity: 1 Low   PT General Charges $$ ACUTE PT VISIT: 1 Visit        Hilton Cork, PT, DPT Secure Chat Preferred  Rehab Office [phone]   Arturo Morton Brion Aliment 08/01/2023, 5:28 PM

## 2023-08-01 NOTE — Progress Notes (Signed)
PROGRESS NOTE  Harry Norris  QMV:[id number] DOB: [birth year] DOA: 07/31/2023 PCP: Paulina Fusi, MD   Brief Narrative: Patient is a 87 year old male with history of COPD, diabetes, hypertension, hyperlipidemia, chronic hypoxic respiratory failure on 2 L of oxygen, chronic combined systolic/diastolic CHF, CKD stage IIIb who presented with coughing spells, dyspnea, fever.  He was recently admitted here with acute medical encephalopathy.  SNF was recommended on discharge but he wanted to go home with home health.  On presentation ,he was mildly febrile.  Lab work showed leukocytosis, low magnesium, prolonged QTc.  Chest x-ray showed patchy opacities bilaterally on lung bases right greater than left concerning for pneumonia.  Started on antibiotics for hospital-acquired pneumonia.  Assessment & Plan:  Principal Problem:   Nosocomial pneumonia Active Problems:   Prostate cancer (HCC)   DM2 (diabetes mellitus, type 2) (HCC)   Hyperlipidemia   Hypertension   CKD stage 3b, GFR 30-44 ml/min (HCC)   Essential hypertension   Chronic hypoxic respiratory failure (HCC)   Chronic combined systolic and diastolic heart failure (HCC)   Hospital-acquired pneumonia: Recently admitted here for metabolic encephalopathy.  Became febrile at home, dyspneic, weak.  Febrile on presentation with leukocytosis.  Chest x-ray suspicious for pneumonia bilaterally.  Follow-up cultures.  Started on broad-spectrum antibiotics with  cefepime.  Vancomycin discontinued because MRSA PCR is negative.  This morning he remains on room air.  Chronic combined systolic/diastolic CHF: Last echo showed EF of 25 to 30%.  Patient also appeared volume overloaded on presentation, given IV Lasix.  Has bilateral lower extremity edema. Continue to monitor input/output, daily weight.  Will check new echocardiogram  COPD/chronic hypoxic respiratory failure: On 2 L of oxygen at baseline.  Currently on the same/room air  CKD stage IIIb:  Currently kidney function better than baseline, baseline creatinine around 1.8  Hypomagnesemia: Currently being monitored and supplemented as needed.  Prolonged QTc: Continue to monitor EKG.  Monitor on telemetry.  Last QTc of 509  Hyperlipidemia: On fenofibrate ,statin   Deconditoning:  On his last admission, he was recommended SNF.  He wanted to go home with home health.  PT/OT reconsulted.  Declines any SNF      Pressure Injury 09/26/21 Buttocks Left Deep Tissue Pressure Injury - Purple or maroon localized area of discolored intact skin or blood-filled blister due to damage of underlying soft tissue from pressure and/or shear. (Active)  09/26/21 2130  Location: Buttocks  Location Orientation: Left  Staging: Deep Tissue Pressure Injury - Purple or maroon localized area of discolored intact skin or blood-filled blister due to damage of underlying soft tissue from pressure and/or shear.  Wound Description (Comments):   Present on Admission: Yes  Dressing Type Foam - Lift dressing to assess site every shift 08/01/23 0725    DVT prophylaxis:Heparin     Code Status: Prior  Family Communication:: Discussed with son [name] on phone on 12/1  Patient status:Inpatient  Patient is from :Home  Anticipated discharge MW:UXLK   Estimated DC date:1-2 days   Consultants: None  Procedures:None  Antimicrobials:  Anti-infectives (From admission, onward)    Start     Dose/Rate Route Frequency Ordered Stop   08/02/23 2000  Vancomycin (VANCOCIN) 1,500 mg in sodium chloride 0.9 % 500 mL IVPB        1,500 mg 250 mL/hr over 120 Minutes Intravenous Every 48 hours 07/31/23 1955     07/31/23 1945  vancomycin (VANCOCIN) 500 mg in sodium chloride 0.9 % 100 mL IVPB  Placed in "Followed by" Linked Group   500 mg 100 mL/hr over 60 Minutes Intravenous  Once 07/31/23 1842 07/31/23 2226   07/31/23 1900  ceFEPIme (MAXIPIME) 2 g in sodium chloride 0.9 % 100 mL IVPB        2 g 200 mL/hr over  30 Minutes Intravenous Every 12 hours 07/31/23 1844     07/31/23 1845  Vancomycin (VANCOCIN) 1,500 mg in sodium chloride 0.9 % 500 mL IVPB  Status:  Discontinued        1,500 mg 250 mL/hr over 120 Minutes Intravenous  Once 07/31/23 1839 07/31/23 1841   07/31/23 1845  vancomycin (VANCOCIN) IVPB 1000 mg/200 mL premix       Placed in "Followed by" Linked Group   1,000 mg 200 mL/hr over 60 Minutes Intravenous  Once 07/31/23 1842 07/31/23 2047   07/31/23 1815  ceFEPIme (MAXIPIME) 1 g in sodium chloride 0.9 % 100 mL IVPB  Status:  Discontinued        1 g 200 mL/hr over 30 Minutes Intravenous  Once 07/31/23 1814 07/31/23 1844       Subjective: Patient seen and examined at bedside.  Lying on bed.  He was on room air.  Did not complain of any worsening shortness of breath or cough.  He says he feels better today.  Alert and oriented.  Has some bilateral lower extremity edema.  Patient declined SNF.  Objective: Vitals:   08/01/23 0410 08/01/23 0428 08/01/23 0500 08/01/23 0725  BP: (!) 127/56   (!) 112/45  Pulse: 70   83  Resp: 18     Temp: 98.3 F (36.8 C)   98.9 F (37.2 C)  TempSrc: Oral   Oral  SpO2: 93% 93%  95%  Weight: 66.9 kg  66.9 kg   Height:        Intake/Output Summary (Last 24 hours) at 08/01/2023 0818 Last data filed at 08/01/2023 1610 Gross per 24 hour  Intake 845.08 ml  Output 600 ml  Net 245.08 ml   Filed Weights   07/31/23 1729 08/01/23 0410 08/01/23 0500  Weight: 64.9 kg 66.9 kg 66.9 kg    Examination:  General exam: Overall comfortable, not in distress, pleasant elderly gentleman HEENT: PERRL Respiratory system: Mildly  diminished air sounds bilaterally, no wheezing or crackles Cardiovascular system: S1 & S2 heard, RRR.  Gastrointestinal system: Abdomen is nondistended, soft and nontender. Central nervous system: Alert and oriented Extremities: Bilateral lower EXTR pitting edema more on the right , no clubbing ,no cyanosis Skin: No rashes, no ulcers,no  icterus     Data Reviewed: I have personally reviewed following labs and imaging studies  CBC: Recent Labs  Lab 07/31/23 1739 07/31/23 1754  WBC 14.1*  --   NEUTROABS 12.9*  --   HGB 10.0* 11.2*  HCT 31.6* 33.0*  MCV 91.6  --   PLT 434*  --    Basic Metabolic Panel: Recent Labs  Lab 07/31/23 1754 07/31/23 2335  NA 138  --   K 4.2  --   CL 104  --   GLUCOSE 96  --   BUN 16  --   CREATININE 1.30*  --   MG  --  1.5*     Recent Results (from the past 240 hour(s))  Resp panel by RT-PCR (RSV, Flu A&B, Covid) Anterior Nasal Swab     Status: None   Collection Time: 07/31/23  5:50 PM   Specimen: Anterior Nasal Swab  Result Value Ref Range  Status   SARS Coronavirus 2 by RT PCR NEGATIVE NEGATIVE Final    Comment: (NOTE) SARS-CoV-2 target nucleic acids are NOT DETECTED.  The SARS-CoV-2 RNA is generally detectable in upper respiratory specimens during the acute phase of infection. The lowest concentration of SARS-CoV-2 viral copies this assay can detect is 138 copies/mL. A negative result does not preclude SARS-Cov-2 infection and should not be used as the sole basis for treatment or other patient management decisions. A negative result may occur with  improper specimen collection/handling, submission of specimen other than nasopharyngeal swab, presence of viral mutation(s) within the areas targeted by this assay, and inadequate number of viral copies(<138 copies/mL). A negative result must be combined with clinical observations, patient history, and epidemiological information. The expected result is Negative.  Fact Sheet for Patients:  BloggerCourse.com  Fact Sheet for Healthcare Providers:  SeriousBroker.it  This test is no t yet approved or cleared by the [place] FDA and  has been authorized for detection and/or diagnosis of SARS-CoV-2 by FDA under an Emergency Use Authorization (EUA). This EUA will remain   in effect (meaning this test can be used) for the duration of the COVID-19 declaration under Section 564(b)(1) of the Act, 21 U.S.C.section 360bbb-3(b)(1), unless the authorization is terminated  or revoked sooner.       Influenza A by PCR NEGATIVE NEGATIVE Final   Influenza B by PCR NEGATIVE NEGATIVE Final    Comment: (NOTE) The Xpert Xpress SARS-CoV-2/FLU/RSV plus assay is intended as an aid in the diagnosis of influenza from Nasopharyngeal swab specimens and should not be used as a sole basis for treatment. Nasal washings and aspirates are unacceptable for Xpert Xpress SARS-CoV-2/FLU/RSV testing.  Fact Sheet for Patients: BloggerCourse.com  Fact Sheet for Healthcare Providers: SeriousBroker.it  This test is not yet approved or cleared by the [place] FDA and has been authorized for detection and/or diagnosis of SARS-CoV-2 by FDA under an Emergency Use Authorization (EUA). This EUA will remain in effect (meaning this test can be used) for the duration of the COVID-19 declaration under Section 564(b)(1) of the Act, 21 U.S.C. section 360bbb-3(b)(1), unless the authorization is terminated or revoked.     Resp Syncytial Virus by PCR NEGATIVE NEGATIVE Final    Comment: (NOTE) Fact Sheet for Patients: BloggerCourse.com  Fact Sheet for Healthcare Providers: SeriousBroker.it  This test is not yet approved or cleared by the [place] FDA and has been authorized for detection and/or diagnosis of SARS-CoV-2 by FDA under an Emergency Use Authorization (EUA). This EUA will remain in effect (meaning this test can be used) for the duration of the COVID-19 declaration under Section 564(b)(1) of the Act, 21 U.S.C. section 360bbb-3(b)(1), unless the authorization is terminated or revoked.  Performed at [facility], [address] [place], [place] [address]   MRSA  Next Gen by PCR, Nasal     Status: None   Collection Time: 07/31/23  9:41 PM   Specimen: Nasal Mucosa; Nasal Swab  Result Value Ref Range Status   MRSA by PCR Next Gen NOT DETECTED NOT DETECTED Final    Comment: (NOTE) The GeneXpert MRSA Assay (FDA approved for NASAL specimens only), is one component of a comprehensive MRSA colonization surveillance program. It is not intended to diagnose MRSA infection nor to guide or monitor treatment for MRSA infections. Test performance is not FDA approved in patients less than 33 years old. Performed at [facility], [address], [place], [place] [address]  Radiology Studies: US Venous Img Lower Unilateral Right  Result Date: 07/31/2023 CLINICAL DATA:  Swelling. EXAM: Right LOWER EXTREMITY VENOUS DOPPLER ULTRASOUND TECHNIQUE: Gray-scale sonography with compression, as well as color and duplex ultrasound, were performed to evaluate the deep venous system(s) from the level of the common femoral vein through the popliteal and proximal calf veins. COMPARISON:  09/04/2019 FINDINGS: VENOUS Normal compressibility of the common femoral, superficial femoral, and popliteal veins. Visualized portions of profunda femoral vein and great saphenous vein unremarkable. No filling defects to suggest DVT on grayscale or color Doppler imaging. Doppler waveforms show normal direction of venous flow, normal respiratory plasticity and response to augmentation. Limited views of the contralateral common femoral vein are unremarkable. OTHER Soft tissue edema along the calf region. Limitations: Limited evaluation of the calf vessels with the soft tissue edema. IMPRESSION: No evidence of right lower extremity DVT. Limited evaluation of the calf vessels with significant soft tissue edema as per the sonographer. Electronically Signed   By: Karen Kays M.D.   On: 07/31/2023 19:14   DG Chest Portable 1 View  Result Date: 07/31/2023 CLINICAL DATA:  Fever EXAM: PORTABLE  CHEST 1 VIEW COMPARISON:  Chest x-ray 07/14/2023 FINDINGS: There are patchy airspace opacities in both lung bases, right greater than left. There is a small right pleural effusion. The heart is enlarged, unchanged. There is no pneumothorax or acute fracture. IMPRESSION: 1. Patchy airspace opacities in both lung bases, right greater than left, concerning for pneumonia. 2. Small right pleural effusion. 3. Cardiomegaly. Electronically Signed   By: Darliss Cheney M.D.   On: 07/31/2023 18:19    Scheduled Meds:  acidophilus  2 capsule Oral Daily   furosemide  40 mg Intravenous BID   guaiFENesin  600 mg Oral BID   Continuous Infusions:  ceFEPime (MAXIPIME) IV 2 g (08/01/23 0609)   [START ON 08/02/2023] vancomycin       LOS: 1 day   Burnadette Pop, MD [facility] P12/08/2022, 8:18 AM

## 2023-08-01 NOTE — Plan of Care (Signed)

## 2023-08-01 NOTE — Evaluation (Signed)
Occupational Therapy Evaluation Patient Details Name: Harry Norris MRN: [id number] DOB: 1936-03-18 Today's Date: 08/01/2023   History of Present Illness 87 y.o. male presents to [facility] 07/31/23 w/ coughing spells. Admitted w/ PNA and fluid overload, negative R LE DVT study. Recent admit 11/13-11/15 w/ acute metabolic encephalopathy and diarrhea. PMHx: DM, COPD, chronic hypoxic respiratory failure on 2L [place] PRN, cardiomyopathy EF 35%, HLD, HTN, HLD, prostate cancer.   Clinical Impression   Pt reports PTA he was independent with ADL/IADL and functional mobility. Pt d.c home after recent hospitalization despite OT/PT recommendation for SNF. Pt reports he was able to manage at home with support from his sons. Pt currently requires contact guard assistance for functional mobility without AD. He tolerated OOB ADL for . He demonstrates below baseline functioning secondary to decreased activity tolerance, instability, and generalized deconditioning. Patient will benefit from continued inpatient follow up therapy, <3 hours/day. Will continue to follow acutely and progress appropriately.        If plan is discharge home, recommend the following: A little help with walking and/or transfers;A lot of help with bathing/dressing/bathroom;Assistance with feeding;Assist for transportation;Help with stairs or ramp for entrance    Functional Status Assessment  Patient has had a recent decline in their functional status and demonstrates the ability to make significant improvements in function in a reasonable and predictable amount of time.  Equipment Recommendations  Other (comment) (tbd)    Recommendations for Other Services       Precautions / Restrictions Precautions Precautions: Fall Precaution Comments: incontinent B/B Restrictions Weight Bearing Restrictions: No      Mobility Bed Mobility Overal bed mobility: Needs Assistance Bed Mobility: Supine to Sit     Supine to sit: Supervision      General bed mobility comments: supervision for safety    Transfers Overall transfer level: Needs assistance     Sit to Stand: Contact guard assist     Step pivot transfers: Contact guard assist     General transfer comment: contact guard assistance for safety and stability, no loss of balance this session      Balance Overall balance assessment: Needs assistance Sitting-balance support: No upper extremity supported, Feet supported Sitting balance-Leahy Scale: Good     Standing balance support: No upper extremity supported, During functional activity Standing balance-Leahy Scale: Fair Standing balance comment: improved stability with at least single UE support                           ADL either performed or assessed with clinical judgement   ADL Overall ADL's : Needs assistance/impaired Eating/Feeding: Set up;Sitting   Grooming: Contact guard assist;Standing   Upper Body Bathing: Set up;Sitting   Lower Body Bathing: Contact guard assist;Sit to/from stand   Upper Body Dressing : Set up;Sitting   Lower Body Dressing: Contact guard assist;Sit to/from stand   Toilet Transfer: Contact guard assist;Ambulation   Toileting- Clothing Manipulation and Hygiene: Contact guard assist       Functional mobility during ADLs: Contact guard assist General ADL Comments: pt limited by deconditioning, decreased activity tolerance     Vision Baseline Vision/History: 0 No visual deficits Ability to See in Adequate Light: 0 Adequate Patient Visual Report: No change from baseline       Perception         Praxis         Pertinent Vitals/Pain Pain Assessment Pain Assessment: No/denies pain     Extremity/Trunk Assessment Upper  Extremity Assessment Upper Extremity Assessment: Overall WFL for tasks assessed   Lower Extremity Assessment Lower Extremity Assessment: Defer to PT evaluation   Cervical / Trunk Assessment Cervical / Trunk Assessment: Kyphotic    Communication Communication Communication: Hearing impairment   Cognition Arousal: Alert Behavior During Therapy: WFL for tasks assessed/performed Overall Cognitive Status: Within Functional Limits for tasks assessed                                       General Comments  unable to get O2 reading, pt reports he uses O2 prn at home    Exercises     Shoulder Instructions      Home Living Family/patient expects to be discharged to:: Private residence Living Arrangements: Alone Available Help at Discharge: Family;Friend(s);Available PRN/intermittently Type of Home: House Home Access: Stairs to enter [employer] of Steps: 5 Entrance Stairs-Rails: Can reach both Home Layout: One level     Bathroom Shower/Tub: Tub/shower unit;Walk-in shower   Bathroom Toilet: Standard Bathroom Accessibility: Yes How Accessible: Accessible via walker Home Equipment: Rollator (4 wheels);Cane - single point;Shower Counsellor (2 wheels)          Prior Functioning/Environment Prior Level of Function : Independent/Modified Independent;Driving             Mobility Comments: pt reports he drives, does not use device ADLs Comments: reports independence with all ADL/IADL        OT Problem List: Decreased strength;Decreased activity tolerance;Impaired balance (sitting and/or standing);Decreased safety awareness;Decreased knowledge of use of DME or AE      OT Treatment/Interventions: Self-care/ADL training;Therapeutic exercise;Energy conservation;DME and/or AE instruction;Manual therapy;Patient/family education;Balance training    OT Goals(Current goals can be found in the care plan section) Acute Rehab OT Goals Patient Stated Goal: to go home OT Goal Formulation: With patient Time For Goal Achievement: 08/15/23 Potential to Achieve Goals: Good ADL Goals Pt Will Perform Grooming: with modified independence Pt Will Perform Lower Body Dressing: with  modified independence;sit to/from stand Pt Will Transfer to Toilet: with modified independence;ambulating Additional ADL Goal #1: Pt will demonstrate independence with 3 energy conservation strategies during ADL.  OT Frequency: Min 2X/week    Co-evaluation              AM-PAC OT "6 Clicks" Daily Activity     Outcome Measure Help from another person eating meals?: None Help from another person taking care of personal grooming?: A Little Help from another person toileting, which includes using toliet, bedpan, or urinal?: A Little Help from another person bathing (including washing, rinsing, drying)?: A Little Help from another person to put on and taking off regular upper body clothing?: A Little Help from another person to put on and taking off regular lower body clothing?: A Little 6 Click Score: 19   End of Session Equipment Utilized During Treatment: Gait belt Nurse Communication: Mobility status  Activity Tolerance: Patient tolerated treatment well Patient left: in chair;with call bell/phone within reach;with chair alarm set  OT Visit Diagnosis: Unsteadiness on feet (R26.81);Other abnormalities of gait and mobility (R26.89);Muscle weakness (generalized) (M62.81)                Time: 2956-2130 OT Time Calculation (min): 13 min Charges:  OT General Charges $OT Visit: 1 Visit OT Evaluation $OT Eval Moderate Complexity: 1 Mod  Raeven Pint OTR/L Acute Rehabilitation Services Office: [phone]   Providence Crosby 08/01/2023, 1:51 PM

## 2023-08-01 NOTE — Progress Notes (Signed)
Echocardiogram 2D Echocardiogram has been performed.  Warren Lacy Marcha Licklider RDCS 08/01/2023, 3:37 PM

## 2023-08-02 DIAGNOSIS — J189 Pneumonia, unspecified organism: Secondary | ICD-10-CM | POA: Diagnosis not present

## 2023-08-02 DIAGNOSIS — Y95 Nosocomial condition: Secondary | ICD-10-CM | POA: Diagnosis not present

## 2023-08-02 LAB — CBC
HCT: 29.1 % — ABNORMAL LOW (ref 39.0–52.0)
Hemoglobin: 9.4 g/dL — ABNORMAL LOW (ref 13.0–17.0)
MCH: 29.4 pg (ref 26.0–34.0)
MCHC: 32.3 g/dL (ref 30.0–36.0)
MCV: 90.9 fL (ref 80.0–100.0)
Platelets: 391 10*3/uL (ref 150–400)
RBC: 3.2 MIL/uL — ABNORMAL LOW (ref 4.22–5.81)
RDW: 17.1 % — ABNORMAL HIGH (ref 11.5–15.5)
WBC: 10.2 10*3/uL (ref 4.0–10.5)
nRBC: 0 % (ref 0.0–0.2)

## 2023-08-02 LAB — BASIC METABOLIC PANEL
Anion gap: 8 (ref 5–15)
BUN: 22 mg/dL (ref 8–23)
CO2: 27 mmol/L (ref 22–32)
Calcium: 8.5 mg/dL — ABNORMAL LOW (ref 8.9–10.3)
Chloride: 100 mmol/L (ref 98–111)
Creatinine, Ser: 1.55 mg/dL — ABNORMAL HIGH (ref 0.61–1.24)
GFR, Estimated: 44 mL/min — ABNORMAL LOW (ref >60)
Glucose, Bld: 140 mg/dL — ABNORMAL HIGH (ref 70–99)
Potassium: 3.4 mmol/L — ABNORMAL LOW (ref 3.5–5.1)
Sodium: 135 mmol/L (ref 135–145)

## 2023-08-02 MED ORDER — TRAMADOL HCL 50 MG PO TABS
50.0000 mg | ORAL_TABLET | Freq: Once | ORAL | Status: AC
  Administered 2023-08-02: 50 mg via ORAL
  Filled 2023-08-02: qty 1

## 2023-08-02 MED ORDER — LIDOCAINE 5 % EX PTCH
1.0000 | MEDICATED_PATCH | CUTANEOUS | Status: AC
  Administered 2023-08-02 – 2023-08-03 (×2): 1 via TRANSDERMAL
  Filled 2023-08-02 (×2): qty 1

## 2023-08-02 MED ORDER — POTASSIUM CHLORIDE CRYS ER 20 MEQ PO TBCR
40.0000 meq | EXTENDED_RELEASE_TABLET | Freq: Once | ORAL | Status: AC
  Administered 2023-08-02: 40 meq via ORAL
  Filled 2023-08-02: qty 2

## 2023-08-02 MED ORDER — ACETAMINOPHEN 325 MG PO TABS
650.0000 mg | ORAL_TABLET | Freq: Four times a day (QID) | ORAL | Status: DC | PRN
  Administered 2023-08-02 – 2023-08-03 (×4): 650 mg via ORAL
  Filled 2023-08-02 (×4): qty 2

## 2023-08-02 NOTE — TOC Initial Note (Addendum)
Transition of Care (TOC) - Initial/Assessment Note   Patient from home alone. States his son lives 20 miles away.   PT recommending HHPT at discharge. Patient active with [facility] for HHPT and HHOT. Patient would like to continue with same agency and same people. Cory with [facility] aware. NCM entered orders and asked MD to sign.   Anticipated discharge date tomorrow 08/03/23. Cory with [facility] aware. Patient aware and states son can transport him home. He does have home oxygen but only wears it at night.    Patient has walker , canes, walking sticks , bedside commode ( does not use) and shower chair at home.  Patient Details  Name: Harry Norris MRN: [id number] Date of Birth: 11-15-1935  Transition of Care [facility]) CM/SW Contact:    Kingsley Plan, RN Phone Number: 08/02/2023, 11:02 AM  Clinical Narrative:                   Expected Discharge Plan: Home w Home Health Services Barriers to Discharge: Continued Medical Work up   Patient Goals and CMS Choice Patient states their goals for this hospitalization and ongoing recovery are:: to return to home CMS Medicare.gov Compare Post Acute Care list provided to:: Patient Choice offered to / list presented to : Patient      Expected Discharge Plan and Services   Discharge Planning Services: CM Consult Post Acute Care Choice: Home Health Living arrangements for the past 2 months: Single Family Home                 DME Arranged: N/A DME Agency: NA       HH Arranged: PT, OT HH Agency: [facility] Date [facility] Agency Contacted: 08/02/23 Time HH Agency Contacted: [phone] Representative spoke with at [facility] Agency: left [employer] a message, await call back  Prior Living Arrangements/Services Living arrangements for the past 2 months: Single Family Home Lives with:: Self Patient language and need for interpreter reviewed:: Yes Do you feel safe going back to the place where you live?: Yes      Need for Family Participation in Patient  Care: No (Comment) Care giver support system in place?: No (comment) (son lives close) Current home services: DME Criminal Activity/Legal Involvement Pertinent to Current Situation/Hospitalization: No - Comment as needed  Activities of Daily Living   ADL Screening (condition at time of admission) Independently performs ADLs?: Yes (appropriate for developmental age) Is the patient deaf or have difficulty hearing?: No Does the patient have difficulty seeing, even when wearing glasses/contacts?: No Does the patient have difficulty concentrating, remembering, or making decisions?: Yes  Permission Sought/Granted   Permission granted to share information with : Yes, Verbal Permission Granted     Permission granted to share info w AGENCY: [facility]        Emotional Assessment Appearance:: Appears stated age Attitude/Demeanor/Rapport: Engaged Affect (typically observed): Accepting Orientation: : Oriented to Self, Oriented to Place, Oriented to  Time, Oriented to Situation Alcohol / Substance Use: Not Applicable Psych Involvement: No (comment)  Admission diagnosis:  Nosocomial pneumonia [J18.9, Y95] Pneumonia of right lower lobe due to infectious organism [J18.9] Patient Active Problem List   Diagnosis Date Noted   Nosocomial pneumonia 07/31/2023   AMS (altered mental status) 07/15/2023   Acute metabolic encephalopathy 07/15/2023   Hypokalemia 07/15/2023   History of COPD 07/15/2023   Chronic hypoxic respiratory failure (HCC) 07/15/2023   Chronic combined systolic and diastolic heart failure (HCC) 07/15/2023   Aspiration pneumonia (HCC) 07/31/2022  Right upper lobe pulmonary nodule 07/31/2022   AKI (acute kidney injury) (HCC) 07/31/2022   Essential hypertension 03/19/2022   Anemia of chronic disease 03/18/2022   Benign neoplasm of colon 03/18/2022   Bronchitis 03/18/2022   Cancer (HCC) 03/18/2022   Hypertension 03/18/2022   Kidney stone 03/18/2022   Diarrhea    Diverticulitis     Blood in the stool    Renal insufficiency 09/26/2021   Pressure injury of skin 07/30/2021   Respiratory failure (HCC) 07/29/2021   Influenza 07/29/2021   Acute hypoxemic respiratory failure (HCC) 07/12/2021   CKD stage 3b, GFR 30-44 ml/min (HCC) 07/12/2021   Community acquired pneumonia of right lower lobe of lung 07/12/2021   Sepsis (HCC) 07/12/2021   Chronic obstructive pulmonary disease with acute exacerbation (HCC) 07/12/2021   Bradycardia 12/19/2018   Orthostatic hypotension 12/19/2018   Fatigue 12/12/2018   Hyperlipidemia 02/07/2018   Asthma 02/07/2018   Chronic rhinitis 02/26/2016   Cardiomyopathy (HCC) 12/18/2015   Chronic venous insufficiency 12/18/2015   LBBB (left bundle branch block) 12/18/2015   Syncope 12/18/2015   Hypoxemia 04/10/2014   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   Hypertensive heart disease    Prostate cancer (HCC)    AAA (abdominal aortic aneurysm) (HCC)    Carotid artery occlusion    DM2 (diabetes mellitus, type 2) (HCC)    Leg swelling 11/28/2013   Other symptoms involving cardiovascular system 11/28/2013   Varicose veins of bilateral lower extremities with other complications 11/28/2013   PCP:  Paulina Fusi, MD Pharmacy:   [id number] - [place], [place] - [address] AT [address] [place] [address] Phone: [phone] Fax: [phone]  [facility] [address] [place] [address] Phone: [phone] Fax: [phone]     Social Determinants of Health (SDOH) Social History: SDOH Screenings   Food Insecurity: No Food Insecurity (07/31/2023)  Housing: Low Risk  (07/31/2023)  Transportation Needs: No Transportation Needs (07/31/2023)  Utilities: Not At Risk (07/31/2023)  Depression (PHQ2-9): Low Risk  (08/07/2021)  Tobacco Use: Medium Risk (07/31/2023)   SDOH Interventions:     Readmission Risk Interventions    08/02/2022    2:14 PM  09/29/2021    5:07 PM  Readmission Risk Prevention Plan  Transportation Screening Complete Complete  PCP or Specialist Appt within 3-5 Days Complete   HRI or Home Care Consult Complete Not Complete  HRI or Home Care Consult comments  Pending son's decision about 24 hr homecare v/s ALF  Social Work Consult for Recovery Care Planning/Counseling Complete Not Complete  SW consult not completed comments  Pending son's decision about 24 hr homecare v/s ALF  Palliative Care Screening Not Applicable Not Applicable  Medication Review (RN Care Manager) Complete Complete

## 2023-08-02 NOTE — Progress Notes (Signed)
PIV consult placed. Arrived to patient room . Patient up in chair. Notified nurse to re-consult once patient is back in bed. Tomasita Morrow, RN VAST

## 2023-08-02 NOTE — Progress Notes (Signed)
Occupational Therapy Treatment Patient Details Name: Harry Norris MRN: [id number] DOB: 1935/11/07 Today's Date: 08/02/2023   History of present illness 87 y.o. male presents to [facility] 07/31/23 w/ coughing spells. Admitted w/ PNA and fluid overload, negative R LE DVT study. Recent admit 11/13-11/15 w/ acute metabolic encephalopathy and diarrhea. PMHx: DM, COPD, chronic hypoxic respiratory failure on 2L [place] PRN, cardiomyopathy EF 35%, HLD, HTN, HLD, prostate cancer.   OT comments  Pt sitting up in chair upon therapy arrival. Agreeable to participate in OT treatment session focusing on education, UB strength, and activity tolerance. Completed BUE strengthening using green theraband. OT provided VC for form and technique during all exercises. Pt reports that he's hoping discharging home tomorrow. Discharge recommendation updated to Home home OT. Pt provided with additional warm blankets at end of session as well as snack.       If plan is discharge home, recommend the following:  Assistance with cooking/housework;Assist for transportation;Help with stairs or ramp for entrance   Equipment Recommendations  None recommended by OT       Precautions / Restrictions Precautions Precautions: Fall Restrictions Weight Bearing Restrictions: No       Mobility     Transfers Overall transfer level: Needs assistance Equipment used: None Transfers: Sit to/from Stand Sit to Stand: Supervision          Balance Overall balance assessment: Mild deficits observed, not formally tested        ADL either performed or assessed with clinical judgement   ADL   Eating/Feeding: Supervision/ safety;Sitting         Cognition Arousal: Alert Behavior During Therapy: WFL for tasks assessed/performed Overall Cognitive Status: Within Functional Limits for tasks assessed                Exercises General Exercises - Upper Extremity Shoulder Horizontal ABduction: Strengthening, Both, 10 reps, Seated,  Theraband Theraband Level (Shoulder Horizontal Abduction): Level 3 (Green) Shoulder Horizontal ADduction: Strengthening, Both, 10 reps, Seated, Theraband Theraband Level (Shoulder Horizontal Adduction): Level 3 (Green) Other Exercises Other Exercises: Seated, BUE, shoulder PNF pattern D1, chest press, 1 set, green band, 10X Other Exercises: Pt completed 5 sit to stands from recliner with SBA. VC for form and technique provided.       General Comments VSS on RA    Pertinent Vitals/ Pain       Pain Assessment Pain Assessment: Faces Faces Pain Scale: No hurt         Frequency  Min 1X/week        Progress Toward Goals  OT Goals(current goals can now be found in the care plan section)  Progress towards OT goals: Progressing toward goals            AM-PAC OT "6 Clicks" Daily Activity     Outcome Measure   Help from another person eating meals?: None Help from another person taking care of personal grooming?: None Help from another person toileting, which includes using toliet, bedpan, or urinal?: A Little Help from another person bathing (including washing, rinsing, drying)?: A Little Help from another person to put on and taking off regular upper body clothing?: A Little Help from another person to put on and taking off regular lower body clothing?: A Little 6 Click Score: 20    End of Session    OT Visit Diagnosis: Unsteadiness on feet (R26.81);Other abnormalities of gait and mobility (R26.89);Muscle weakness (generalized) (M62.81)   Activity Tolerance Patient tolerated treatment well   Patient Left  in chair;with call bell/phone within reach;with chair alarm set           Time: [phone] OT Time Calculation (min): 25 min  Charges: OT General Charges $OT Visit: 1 Visit OT Treatments $Therapeutic Exercise: 23-37 mins  Limmie Patricia, OTR/L,CBIS  Supplemental OT - [facility] and [employer] Secure Chat Preferred    Lanessa Shill, Charisse March 08/02/2023, 5:02 PM

## 2023-08-02 NOTE — Progress Notes (Signed)
PROGRESS NOTE  Harry Norris  HYQ:[id number] DOB: 28-Mar-1936 DOA: 07/31/2023 PCP: Paulina Fusi, MD   Brief Narrative: Patient is a 87 year old male with history of COPD, diabetes, hypertension, hyperlipidemia, chronic hypoxic respiratory failure on 2 L of oxygen, chronic combined systolic/diastolic CHF, CKD stage IIIb who presented with coughing spells, dyspnea, fever.  He was recently admitted here with acute medical encephalopathy.  SNF was recommended on discharge but he wanted to go home with home health.  On presentation ,he was mildly febrile.  Lab work showed leukocytosis, low magnesium, prolonged QTc.  Chest x-ray showed patchy opacities bilaterally on lung bases right greater than left concerning for pneumonia.  Started on antibiotics for hospital-acquired pneumonia and also on IV diuretics.  Plan to continue IV diuretics for today.  Possible discharge tomorrow.  Assessment & Plan:  Principal Problem:   Nosocomial pneumonia Active Problems:   Prostate cancer (HCC)   DM2 (diabetes mellitus, type 2) (HCC)   Hyperlipidemia   Hypertension   CKD stage 3b, GFR 30-44 ml/min (HCC)   Essential hypertension   Chronic hypoxic respiratory failure (HCC)   Chronic combined systolic and diastolic heart failure (HCC)   Hospital-acquired pneumonia: Recently admitted here for metabolic encephalopathy.  Became febrile at home, dyspneic, weak.  Febrile on presentation with leukocytosis.  Chest x-ray suspicious for pneumonia bilaterally.  Follow-up cultures.  Started on broad-spectrum antibiotics with  cefepime.  Vancomycin discontinued because MRSA PCR is negative.  This morning he remains on room air.  Leukocytosis resolved.  Afebrile.  Remains alert and oriented  Chronic combined systolic/diastolic CHF: Last echo showed EF of 25 to 30%.  Echo repeated here showed EF of 20 to 25%, severe decrease in heart function, global hypokinesis. He appeared volume overloaded on presentation, currently on  IV Lasix.  Has bilateral lower extremity edema.  Volume is improving, lower extremity edema improving. Continue to monitor input/output, daily weight.   He needs to follow-up with cardiology as an outpatient.  Continue IV Lasix for today.  COPD/chronic hypoxic respiratory failure: On 2 L of oxygen at baseline.  Currently on the same/room air  CKD stage IIIb: Currently kidney function better than baseline, baseline creatinine around 1.8  Hypomagnesemia: Currently being monitored and supplemented as needed.  Prolonged QTc: Continue to monitor EKG.  Monitor on telemetry.  Last QTc of 509  Hyperlipidemia: On fenofibrate ,statin   Deconditoning:  On his last admission, he was recommended SNF.  He wanted to go home with home health.  PT/OT reconsulted.  Declines any SNF.  Plan is to go back to home with home health      Pressure Injury 09/26/21 Buttocks Left Deep Tissue Pressure Injury - Purple or maroon localized area of discolored intact skin or blood-filled blister due to damage of underlying soft tissue from pressure and/or shear. (Active)  09/26/21 2130  Location: Buttocks  Location Orientation: Left  Staging: Deep Tissue Pressure Injury - Purple or maroon localized area of discolored intact skin or blood-filled blister due to damage of underlying soft tissue from pressure and/or shear.  Wound Description (Comments):   Present on Admission: Yes  Dressing Type Foam - Lift dressing to assess site every shift 08/02/23 0724    DVT prophylaxis:heparin injection 5,000 Units Start: 08/01/23 1400Heparin     Code Status: Limited: Do not attempt resuscitation (DNR) -DNR-LIMITED -Do Not Intubate/DNI   Family Communication:: Discussed with son [name] on phone on 12/1  Patient status:Inpatient  Patient is from :Home  Anticipated discharge XB:MWUX  Estimated DC date:tomorow   Consultants: None  Procedures:None  Antimicrobials:  Anti-infectives (From admission, onward)    Start      Dose/Rate Route Frequency Ordered Stop   08/02/23 2000  Vancomycin (VANCOCIN) 1,500 mg in sodium chloride 0.9 % 500 mL IVPB  Status:  Discontinued        1,500 mg 250 mL/hr over 120 Minutes Intravenous Every 48 hours 07/31/23 1955 08/01/23 1449   07/31/23 1945  vancomycin (VANCOCIN) 500 mg in sodium chloride 0.9 % 100 mL IVPB       Placed in "Followed by" Linked Group   500 mg 100 mL/hr over 60 Minutes Intravenous  Once 07/31/23 1842 07/31/23 2226   07/31/23 1900  ceFEPIme (MAXIPIME) 2 g in sodium chloride 0.9 % 100 mL IVPB        2 g 200 mL/hr over 30 Minutes Intravenous Every 12 hours 07/31/23 1844     07/31/23 1845  Vancomycin (VANCOCIN) 1,500 mg in sodium chloride 0.9 % 500 mL IVPB  Status:  Discontinued        1,500 mg 250 mL/hr over 120 Minutes Intravenous  Once 07/31/23 1839 07/31/23 1841   07/31/23 1845  vancomycin (VANCOCIN) IVPB 1000 mg/200 mL premix       Placed in "Followed by" Linked Group   1,000 mg 200 mL/hr over 60 Minutes Intravenous  Once 07/31/23 1842 07/31/23 2047   07/31/23 1815  ceFEPIme (MAXIPIME) 1 g in sodium chloride 0.9 % 100 mL IVPB  Status:  Discontinued        1 g 200 mL/hr over 30 Minutes Intravenous  Once 07/31/23 1814 07/31/23 1844       Subjective: Patient seen and examined at bedside today.  Hemodynamically stable.  Comfortable.  Sitting in the chair.  On room air.Denies any worsening shortness of breath or cough.  Feels better.  Lower extremity swelling has improved but not entirely gone.  He is eager to go home.  Objective: Vitals:   08/01/23 2049 08/02/23 0404 08/02/23 0411 08/02/23 0837  BP: (!) 117/46 (!) 135/51  (!) 111/41  Pulse: 94 91  92  Resp:    19  Temp: 97.9 F (36.6 C) 98 F (36.7 C)  98 F (36.7 C)  TempSrc: Oral   Oral  SpO2: 91% 91%  (!) 88%  Weight:   64.4 kg   Height:        Intake/Output Summary (Last 24 hours) at 08/02/2023 1053 Last data filed at 08/02/2023 0806 Gross per 24 hour  Intake 1600.53 ml  Output 2900  ml  Net -1299.47 ml   Filed Weights   08/01/23 0410 08/01/23 0500 08/02/23 0411  Weight: 66.9 kg 66.9 kg 64.4 kg    Examination:  General exam: Overall comfortable, not in distress HEENT: PERRL Respiratory system: Mildly diminished sounds bilaterally, few crackles Cardiovascular system: S1 & S2 heard, RRR.  Gastrointestinal system: Abdomen is nondistended, soft and nontender. Central nervous system: Alert and oriented Extremities: Trace lower EXTR bilateral pitting  edema, no clubbing ,no cyanosis Skin: No rashes, no ulcers,no icterus     Data Reviewed: I have personally reviewed following labs and imaging studies  CBC: Recent Labs  Lab 07/31/23 1739 07/31/23 1754 08/01/23 0826 08/02/23 0831  WBC 14.1*  --  16.6* 10.2  NEUTROABS 12.9*  --   --   --   HGB 10.0* 11.2* 8.9* 9.4*  HCT 31.6* 33.0* 28.4* 29.1*  MCV 91.6  --  91.0 90.9  PLT 434*  --  367 391   Basic Metabolic Panel: Recent Labs  Lab 07/31/23 1754 07/31/23 2335 08/01/23 0826 08/02/23 0831  NA 138  --  133* 135  K 4.2  --  3.8 3.4*  CL 104  --  101 100  CO2  --   --  22 27  GLUCOSE 96  --  96 140*  BUN 16  --  17 22  CREATININE 1.30*  --  1.42* 1.55*  CALCIUM  --   --  8.6* 8.5*  MG  --  1.5* 1.9  --      Recent Results (from the past 240 hour(s))  Culture, blood (routine x 2)     Status: None (Preliminary result)   Collection Time: 07/31/23  5:39 PM   Specimen: BLOOD  Result Value Ref Range Status   Specimen Description   Final    BLOOD LEFT ANTECUBITAL Performed at [facility], [address] [place], [place] [address]    Special Requests   Final    BOTTLES DRAWN AEROBIC AND ANAEROBIC Blood Culture results may not be optimal due to an inadequate volume of blood received in culture bottles Performed at [facility], [address] [place], [place] [address]    Culture   Final    NO GROWTH 1 DAY Performed at [facility], [address] [place], [place]  [address]    Report Status PENDING  Incomplete  Culture, blood (routine x 2)     Status: None (Preliminary result)   Collection Time: 07/31/23  5:45 PM   Specimen: BLOOD  Result Value Ref Range Status   Specimen Description   Final    BLOOD BLOOD RIGHT WRIST Performed at [facility], [address] [place], [place] [address]    Special Requests   Final    BOTTLES DRAWN AEROBIC AND ANAEROBIC Blood Culture adequate volume Performed at [facility], [address] [place], [place] [address]    Culture   Final    NO GROWTH 1 DAY Performed at [facility], [address], [place], [place] [address]    Report Status PENDING  Incomplete  Resp panel by RT-PCR (RSV, Flu A&B, Covid) Anterior Nasal Swab     Status: None   Collection Time: 07/31/23  5:50 PM   Specimen: Anterior Nasal Swab  Result Value Ref Range Status   SARS Coronavirus 2 by RT PCR NEGATIVE NEGATIVE Final    Comment: (NOTE) SARS-CoV-2 target nucleic acids are NOT DETECTED.  The SARS-CoV-2 RNA is generally detectable in upper respiratory specimens during the acute phase of infection. The lowest concentration of SARS-CoV-2 viral copies this assay can detect is 138 copies/mL. A negative result does not preclude SARS-Cov-2 infection and should not be used as the sole basis for treatment or other patient management decisions. A negative result may occur with  improper specimen collection/handling, submission of specimen other than nasopharyngeal swab, presence of viral mutation(s) within the areas targeted by this assay, and inadequate number of viral copies(<138 copies/mL). A negative result must be combined with clinical observations, patient history, and epidemiological information. The expected result is Negative.  Fact Sheet for Patients:  BloggerCourse.com  Fact Sheet for Healthcare Providers:  SeriousBroker.it  This test is no t yet  approved or cleared by the [place] FDA and  has been authorized for detection and/or diagnosis of SARS-CoV-2 by FDA under an Emergency Use Authorization (EUA).  This EUA will remain  in effect (meaning this test can be used) for the duration of the COVID-19 declaration under Section 564(b)(1) of the Act, 21 U.S.C.section 360bbb-3(b)(1), unless the authorization is terminated  or revoked sooner.       Influenza A by PCR NEGATIVE NEGATIVE Final   Influenza B by PCR NEGATIVE NEGATIVE Final    Comment: (NOTE) The Xpert Xpress SARS-CoV-2/FLU/RSV plus assay is intended as an aid in the diagnosis of influenza from Nasopharyngeal swab specimens and should not be used as a sole basis for treatment. Nasal washings and aspirates are unacceptable for Xpert Xpress SARS-CoV-2/FLU/RSV testing.  Fact Sheet for Patients: BloggerCourse.com  Fact Sheet for Healthcare Providers: SeriousBroker.it  This test is not yet approved or cleared by the [place] FDA and has been authorized for detection and/or diagnosis of SARS-CoV-2 by FDA under an Emergency Use Authorization (EUA). This EUA will remain in effect (meaning this test can be used) for the duration of the COVID-19 declaration under Section 564(b)(1) of the Act, 21 U.S.C. section 360bbb-3(b)(1), unless the authorization is terminated or revoked.     Resp Syncytial Virus by PCR NEGATIVE NEGATIVE Final    Comment: (NOTE) Fact Sheet for Patients: BloggerCourse.com  Fact Sheet for Healthcare Providers: SeriousBroker.it  This test is not yet approved or cleared by the [place] FDA and has been authorized for detection and/or diagnosis of SARS-CoV-2 by FDA under an Emergency Use Authorization (EUA). This EUA will remain in effect (meaning this test can be used) for the duration of the COVID-19 declaration under Section 564(b)(1) of  the Act, 21 U.S.C. section 360bbb-3(b)(1), unless the authorization is terminated or revoked.  Performed at [facility], [address] [place], [place] [address]   MRSA Next Gen by PCR, Nasal     Status: None   Collection Time: 07/31/23  9:41 PM   Specimen: Nasal Mucosa; Nasal Swab  Result Value Ref Range Status   MRSA by PCR Next Gen NOT DETECTED NOT DETECTED Final    Comment: (NOTE) The GeneXpert MRSA Assay (FDA approved for NASAL specimens only), is one component of a comprehensive MRSA colonization surveillance program. It is not intended to diagnose MRSA infection nor to guide or monitor treatment for MRSA infections. Test performance is not FDA approved in patients less than 68 years old. Performed at [facility], [address], [place], [place] [address]      Radiology Studies: ECHOCARDIOGRAM COMPLETE  Result Date: 08/01/2023    ECHOCARDIOGRAM REPORT   Patient Name:   EION KOLIN Date of Exam: 08/01/2023 Medical Rec #:  [id number]       Height:       70.0 in Accession #:    [id number]      Weight:       147.5 lb Date of Birth:  1936-03-18        BSA:          1.834 m Patient Age:    87 years        BP:           112/45 mmHg Patient Gender: M               HR:           105 bpm. Exam Location:  Inpatient Procedure: 2D Echo, Color Doppler, Cardiac Doppler and Intracardiac            Opacification Agent Indications:    I50.21 Acute  systolic (congestive) heart failure  History:        Patient has prior history of Echocardiogram examinations, most                 recent 07/30/2021. COPD, Arrythmias:LBBB; Risk                 Factors:Hypertension, Diabetes and Dyslipidemia.  Sonographer:    Irving Burton Senior RDCS Referring Phys: [phone] Malu Pellegrini IMPRESSIONS  1. Left ventricular ejection fraction, by estimation, is 20 to 25%. The left ventricle has severely decreased function. The left ventricle demonstrates global hypokinesis. The left ventricular internal cavity  size was mildly dilated. There is mild asymmetric left ventricular hypertrophy of the infero-lateral segment. Indeterminate diastolic filling due to E-A fusion.  2. Right ventricular systolic function is normal. The right ventricular size is normal. Tricuspid regurgitation signal is inadequate for assessing PA pressure.  3. Left atrial size was mildly dilated.  4. The mitral valve is grossly normal. Trivial mitral valve regurgitation. No evidence of mitral stenosis.  5. The aortic valve is tricuspid. There is mild calcification of the aortic valve. Aortic valve regurgitation is trivial. Aortic valve sclerosis is present, with no evidence of aortic valve stenosis.  6. The inferior vena cava is dilated in size with <50% respiratory variability, suggesting right atrial pressure of 15 mmHg. Comparison(s): No significant change from prior study. Conclusion(s)/Recommendation(s): No left ventricular mural or apical thrombus/thrombi. FINDINGS  Left Ventricle: Left ventricular ejection fraction, by estimation, is 20 to 25%. The left ventricle has severely decreased function. The left ventricle demonstrates global hypokinesis. Definity contrast agent was given IV to delineate the left ventricular endocardial borders. The left ventricular internal cavity size was mildly dilated. There is mild asymmetric left ventricular hypertrophy of the infero-lateral segment. Abnormal (paradoxical) septal motion, consistent with left bundle branch block. Left ventricular diastolic function could not be evaluated due to atrial fibrillation. Indeterminate diastolic filling due to E-A fusion. Right Ventricle: The right ventricular size is normal. No increase in right ventricular wall thickness. Right ventricular systolic function is normal. Tricuspid regurgitation signal is inadequate for assessing PA pressure. Left Atrium: Left atrial size was mildly dilated. Right Atrium: Right atrial size was normal in size. Pericardium: Trivial pericardial  effusion is present. Mitral Valve: The mitral valve is grossly normal. Trivial mitral valve regurgitation. No evidence of mitral valve stenosis. Tricuspid Valve: The tricuspid valve is normal in structure. Tricuspid valve regurgitation is trivial. Aortic Valve: The aortic valve is tricuspid. There is mild calcification of the aortic valve. Aortic valve regurgitation is trivial. Aortic valve sclerosis is present, with no evidence of aortic valve stenosis. Pulmonic Valve: The pulmonic valve was grossly normal. Pulmonic valve regurgitation is trivial. No evidence of pulmonic stenosis. Aorta: The aortic root and ascending aorta are structurally normal, with no evidence of dilitation. Venous: The inferior vena cava is dilated in size with less than 50% respiratory variability, suggesting right atrial pressure of 15 mmHg. IAS/Shunts: The atrial septum is grossly normal.  LEFT VENTRICLE PLAX 2D LVIDd:         5.50 cm LVIDs:         4.90 cm LV PW:         1.20 cm LV IVS:        0.80 cm LVOT diam:     2.00 cm LV SV:         40 LV SV Index:   22 LVOT Area:     3.14 cm  RIGHT VENTRICLE  RV S prime:     11.30 cm/s TAPSE (M-mode): 1.7 cm LEFT ATRIUM             Index        RIGHT ATRIUM           Index LA diam:        4.50 cm 2.45 cm/m   RA Area:     19.80 cm LA Vol (A2C):   73.4 ml 40.02 ml/m  RA Volume:   54.60 ml  29.77 ml/m LA Vol (A4C):   53.8 ml 29.34 ml/m LA Biplane Vol: 64.6 ml 35.23 ml/m  AORTIC VALVE LVOT Vmax:   64.00 cm/s LVOT Vmean:  43.700 cm/s LVOT VTI:    0.127 m  AORTA Ao Root diam: 3.10 cm Ao Asc diam:  2.50 cm  SHUNTS Systemic VTI:  0.13 m Systemic Diam: 2.00 cm Lennie Odor MD Electronically signed by Lennie Odor MD Signature Date/Time: 08/01/2023/3:41:37 PM    Final    US Venous Img Lower Unilateral Right  Result Date: 07/31/2023 CLINICAL DATA:  Swelling. EXAM: Right LOWER EXTREMITY VENOUS DOPPLER ULTRASOUND TECHNIQUE: Gray-scale sonography with compression, as well as color and duplex  ultrasound, were performed to evaluate the deep venous system(s) from the level of the common femoral vein through the popliteal and proximal calf veins. COMPARISON:  09/04/2019 FINDINGS: VENOUS Normal compressibility of the common femoral, superficial femoral, and popliteal veins. Visualized portions of profunda femoral vein and great saphenous vein unremarkable. No filling defects to suggest DVT on grayscale or color Doppler imaging. Doppler waveforms show normal direction of venous flow, normal respiratory plasticity and response to augmentation. Limited views of the contralateral common femoral vein are unremarkable. OTHER Soft tissue edema along the calf region. Limitations: Limited evaluation of the calf vessels with the soft tissue edema. IMPRESSION: No evidence of right lower extremity DVT. Limited evaluation of the calf vessels with significant soft tissue edema as per the sonographer. Electronically Signed   By: Karen Kays M.D.   On: 07/31/2023 19:14   DG Chest Portable 1 View  Result Date: 07/31/2023 CLINICAL DATA:  Fever EXAM: PORTABLE CHEST 1 VIEW COMPARISON:  Chest x-ray 07/14/2023 FINDINGS: There are patchy airspace opacities in both lung bases, right greater than left. There is a small right pleural effusion. The heart is enlarged, unchanged. There is no pneumothorax or acute fracture. IMPRESSION: 1. Patchy airspace opacities in both lung bases, right greater than left, concerning for pneumonia. 2. Small right pleural effusion. 3. Cardiomegaly. Electronically Signed   By: Darliss Cheney M.D.   On: 07/31/2023 18:19    Scheduled Meds:  acidophilus  2 capsule Oral Daily   aspirin EC  81 mg Oral q AM   atorvastatin  40 mg Oral QPM   fenofibrate  160 mg Oral q AM   furosemide  40 mg Intravenous BID   guaiFENesin  600 mg Oral BID   heparin injection (subcutaneous)  5,000 Units Subcutaneous Q8H   lidocaine  1 patch Transdermal Q24H   potassium chloride  40 mEq Oral Once   Continuous  Infusions:  ceFEPime (MAXIPIME) IV 2 g (08/02/23 0510)     LOS: 2 days   Burnadette Pop, MD [facility] P12/10/2022, 10:53 AM

## 2023-08-02 NOTE — Care Management Important Message (Signed)
Important Message  Patient Details  Name: HUMZAH FILLA MRN: [id number] Date of Birth: Jul 05, 1936   Important Message Given:  Yes - Medicare IM     Sherilyn Banker 08/02/2023, 3:20 PM

## 2023-08-02 NOTE — Plan of Care (Signed)

## 2023-08-03 ENCOUNTER — Other Ambulatory Visit (HOSPITAL_COMMUNITY): Payer: Self-pay

## 2023-08-03 DIAGNOSIS — I5043 Acute on chronic combined systolic (congestive) and diastolic (congestive) heart failure: Secondary | ICD-10-CM

## 2023-08-03 DIAGNOSIS — J189 Pneumonia, unspecified organism: Secondary | ICD-10-CM | POA: Diagnosis not present

## 2023-08-03 DIAGNOSIS — Y95 Nosocomial condition: Secondary | ICD-10-CM | POA: Diagnosis not present

## 2023-08-03 LAB — BASIC METABOLIC PANEL
Anion gap: 9 (ref 5–15)
BUN: 31 mg/dL — ABNORMAL HIGH (ref 8–23)
CO2: 27 mmol/L (ref 22–32)
Calcium: 8.5 mg/dL — ABNORMAL LOW (ref 8.9–10.3)
Chloride: 97 mmol/L — ABNORMAL LOW (ref 98–111)
Creatinine, Ser: 1.91 mg/dL — ABNORMAL HIGH (ref 0.61–1.24)
GFR, Estimated: 34 mL/min — ABNORMAL LOW (ref >60)
Glucose, Bld: 94 mg/dL (ref 70–99)
Potassium: 4.1 mmol/L (ref 3.5–5.1)
Sodium: 133 mmol/L — ABNORMAL LOW (ref 135–145)

## 2023-08-03 MED ORDER — SODIUM CHLORIDE 0.9 % IV SOLN: 2.0000 g | INTRAVENOUS | Status: DC

## 2023-08-03 MED ORDER — HYDROXYZINE HCL 25 MG PO TABS: 25.0000 mg | ORAL_TABLET | Freq: Three times a day (TID) | ORAL | Status: AC | PRN

## 2023-08-03 MED ORDER — AMOXICILLIN-POT CLAVULANATE 500-125 MG PO TABS
1.0000 | ORAL_TABLET | Freq: Two times a day (BID) | ORAL | 0 refills | Status: DC
  Filled 2023-08-03: qty 8, 4d supply, fill #0

## 2023-08-03 MED ORDER — GUAIFENESIN ER 600 MG PO TB12
600.0000 mg | ORAL_TABLET | Freq: Two times a day (BID) | ORAL | 0 refills | Status: DC
  Filled 2023-08-03: qty 10, 5d supply, fill #0

## 2023-08-03 MED ORDER — FUROSEMIDE 20 MG PO TABS
20.0000 mg | ORAL_TABLET | Freq: Every day | ORAL | 0 refills | Status: AC
  Filled 2023-08-03: qty 60, 60d supply, fill #0

## 2023-08-03 NOTE — Plan of Care (Signed)

## 2023-08-03 NOTE — TOC Progression Note (Signed)
Transition of Care (TOC) - Progression Note   Kandee Keen with [facility] aware discharge is today  Patient Details  Name: Harry Norris MRN: [id number] Date of Birth: 02-18-1936  Transition of Care [facility]) CM/SW Contact  Jenniffer Vessels, Adria Devon, RN Phone Number: 08/03/2023, 4:10 PM  Clinical Narrative:       Expected Discharge Plan: Home w Home Health Services Barriers to Discharge: Continued Medical Work up  Expected Discharge Plan and Services   Discharge Planning Services: CM Consult Post Acute Care Choice: Home Health Living arrangements for the past 2 months: Single Family Home Expected Discharge Date: 08/03/23               DME Arranged: N/A DME Agency: NA       HH Arranged: PT, OT HH Agency: [facility] Date [facility] Agency Contacted: 08/02/23 Time HH Agency Contacted: 1056 Representative spoke with at [facility] Agency: left Kandee Keen a message, await call back   Social Determinants of Health (SDOH) Interventions SDOH Screenings   Food Insecurity: No Food Insecurity (07/31/2023)  Housing: Low Risk  (07/31/2023)  Transportation Needs: No Transportation Needs (07/31/2023)  Utilities: Not At Risk (07/31/2023)  Depression (PHQ2-9): Low Risk  (08/07/2021)  Tobacco Use: Medium Risk (07/31/2023)    Readmission Risk Interventions    08/02/2022    2:14 PM 09/29/2021    5:07 PM  Readmission Risk Prevention Plan  Transportation Screening Complete Complete  PCP or Specialist Appt within 3-5 Days Complete   HRI or Home Care Consult Complete Not Complete  HRI or Home Care Consult comments  Pending son's decision about 24 hr homecare v/s ALF  Social Work Consult for Recovery Care Planning/Counseling Complete Not Complete  SW consult not completed comments  Pending son's decision about 24 hr homecare v/s ALF  Palliative Care Screening Not Applicable Not Applicable  Medication Review (RN Care Manager) Complete Complete

## 2023-08-03 NOTE — Plan of Care (Signed)
  Problem: Education: Goal: Knowledge of General Education information will improve Description: Including pain rating scale, medication(s)/side effects and non-pharmacologic comfort measures 08/03/2023 1450 by Darden Amber, RN Outcome: Adequate for Discharge 08/03/2023 0928 by Darden Amber, RN Outcome: Progressing   Problem: Health Behavior/Discharge Planning: Goal: Ability to manage health-related needs will improve 08/03/2023 1450 by Darden Amber, RN Outcome: Adequate for Discharge 08/03/2023 1610 by Darden Amber, RN Outcome: Progressing   Problem: Clinical Measurements: Goal: Ability to maintain clinical measurements within normal limits will improve 08/03/2023 1450 by Darden Amber, RN Outcome: Adequate for Discharge 08/03/2023 [id number] by Darden Amber, RN Outcome: Progressing Goal: Will remain free from infection 08/03/2023 1450 by Darden Amber, RN Outcome: Adequate for Discharge 08/03/2023 [id number] by Darden Amber, RN Outcome: Progressing Goal: Diagnostic test results will improve 08/03/2023 1450 by Darden Amber, RN Outcome: Adequate for Discharge 08/03/2023 [id number] by Darden Amber, RN Outcome: Progressing Goal: Respiratory complications will improve 08/03/2023 1450 by Darden Amber, RN Outcome: Adequate for Discharge 08/03/2023 1478 by Darden Amber, RN Outcome: Progressing Goal: Cardiovascular complication will be avoided 08/03/2023 1450 by Darden Amber, RN Outcome: Adequate for Discharge 08/03/2023 2956 by Darden Amber, RN Outcome: Progressing   Problem: Activity: Goal: Risk for activity intolerance will decrease 08/03/2023 1450 by Darden Amber, RN Outcome: Adequate for Discharge 08/03/2023 2130 by Darden Amber, RN Outcome: Progressing   Problem: Nutrition: Goal: Adequate nutrition will be maintained 08/03/2023 1450 by Darden Amber, RN Outcome: Adequate for Discharge 08/03/2023 0928 by  Darden Amber, RN Outcome: Progressing   Problem: Coping: Goal: Level of anxiety will decrease 08/03/2023 1450 by Darden Amber, RN Outcome: Adequate for Discharge 08/03/2023 8657 by Darden Amber, RN Outcome: Progressing   Problem: Elimination: Goal: Will not experience complications related to bowel motility 08/03/2023 1450 by Darden Amber, RN Outcome: Adequate for Discharge 08/03/2023 8469 by Darden Amber, RN Outcome: Progressing Goal: Will not experience complications related to urinary retention 08/03/2023 1450 by Darden Amber, RN Outcome: Adequate for Discharge 08/03/2023 6295 by Darden Amber, RN Outcome: Progressing   Problem: Pain Management: Goal: General experience of comfort will improve 08/03/2023 1450 by Darden Amber, RN Outcome: Adequate for Discharge 08/03/2023 2841 by Darden Amber, RN Outcome: Progressing   Problem: Safety: Goal: Ability to remain free from injury will improve 08/03/2023 1450 by Darden Amber, RN Outcome: Adequate for Discharge 08/03/2023 3244 by Darden Amber, RN Outcome: Progressing   Problem: Skin Integrity: Goal: Risk for impaired skin integrity will decrease 08/03/2023 1450 by Darden Amber, RN Outcome: Adequate for Discharge 08/03/2023 0102 by Darden Amber, RN Outcome: Progressing

## 2023-08-03 NOTE — Discharge Summary (Signed)
Physician Discharge Summary  Harry Norris ZOX:[id number] DOB: Jul 22, 1936 DOA: 07/31/2023  PCP: Paulina Fusi, MD  Admit date: 07/31/2023 Discharge date: 08/03/2023  Admitted From: Home Disposition:  Home  Discharge Condition:Stable CODE STATUS:DNR Diet recommendation: Heart Healthy   Brief/Interim Summary:  Patient is a 87 year old male with history of COPD, diabetes, hypertension, hyperlipidemia, chronic hypoxic respiratory failure on 2 L of oxygen, chronic combined systolic/diastolic CHF, CKD stage IIIb who presented with coughing spells, dyspnea, fever.  He was recently admitted here with acute medical encephalopathy.  SNF was recommended on discharge but he wanted to go home with home health.  On presentation ,he was mildly febrile.  Lab work showed leukocytosis, low magnesium, prolonged QTc.  Chest x-ray showed patchy opacities bilaterally on lung bases right greater than left concerning for pneumonia.  Started on antibiotics for hospital-acquired pneumonia and also on IV diuretics.  Cardiology consulted for further diminished EF,recommend outpatient follow-up.  He looks euvolemic today.  Medically stable for discharge.  Following problems were addressed during the hospitalization:  Hospital-acquired pneumonia: Recently admitted here for metabolic encephalopathy.  Became febrile at home, dyspneic, weak.  Febrile on presentation with leukocytosis.  Chest x-ray suspicious for pneumonia bilaterally.  Follow-up cultures.  Started on broad-spectrum antibiotics with  cefepime.  Vancomycin discontinued because MRSA PCR is negative.  This morning ,he remains on room air.  Leukocytosis resolved.  Afebrile.  Remains alert and oriented.  Will change antibiotics to oral on discharge.   Chronic combined systolic/diastolic CHF: Last echo showed EF of 25 to 30%.  Echo repeated here showed EF of 20 to 25%, severe decrease in heart function, global hypokinesis. He appeared volume overloaded on  presentation, started on IV Lasix.  Had bilateral lower extremity edema.   He needs to follow-up with cardiology as an outpatient.  IV Lasix on hold due to elevated creatinine today.  He had significant diuresis during this hospitalization.  Can resume oral Lasix 20 mg from tomorrow   COPD/chronic hypoxic respiratory failure: On 2 L of oxygen at baseline.  Currently on the same/room air   CKD stage IIIb: Baseline creatinine around 1.8.  Kidney function slightly worse.  Check BMP in a week   Hypomagnesemia: Supplemented and corrected    prolonged QTc:  Last QTc of 509.  Monitor as an outpatient   Hyperlipidemia: On fenofibrate ,statin    Deconditoning:  On his last admission, he was recommended SNF.  He wanted to go home with home health.  PT/OT reconsulted.  Declines any SNF.  Plan is to go back to home with home health  Discharge Diagnoses:  Principal Problem:   Nosocomial pneumonia Active Problems:   Prostate cancer (HCC)   DM2 (diabetes mellitus, type 2) (HCC)   Hyperlipidemia   Hypertension   CKD stage 3b, GFR 30-44 ml/min (HCC)   Essential hypertension   Chronic hypoxic respiratory failure (HCC)   Acute on chronic combined systolic and diastolic CHF (congestive heart failure) (HCC)    Discharge Instructions  Discharge Instructions     Diet - low sodium heart healthy   Complete by: As directed    Discharge instructions   Complete by: As directed    1)Please take prescribed medications as instructed 2)Follow up with your PCP in a week.  Do a BMP test to check your kidney function during the follow-up 3)Follow up with cardiology as an outpatient.  You have an appointment on December 13   Increase activity slowly   Complete by: As directed  No wound care   Complete by: As directed       Allergies as of 08/03/2023       Reactions   Ace Inhibitors Cough        Medication List     TAKE these medications    acetaminophen 325 MG tablet Commonly known as:  TYLENOL Take 2 tablets (650 mg total) by mouth every 6 (six) hours as needed for mild pain (pain score 1-3) (or Fever >/= 101).   albuterol 108 (90 Base) MCG/ACT inhaler Commonly known as: VENTOLIN HFA Inhale 2 puffs into the lungs every 6 (six) hours as needed for wheezing or shortness of breath.   amoxicillin-clavulanate 500-125 MG tablet Commonly known as: Augmentin Take 1 tablet by mouth 3 (three) times daily for 4 days.   ascorbic acid 500 MG tablet Commonly known as: VITAMIN C Take 500 mg by mouth daily.   aspirin EC 81 MG tablet Take 81 mg by mouth in the morning.   atorvastatin 40 MG tablet Commonly known as: LIPITOR Take 40 mg by mouth every evening.   Centrum Silver 50+Men Tabs Take 1 tablet by mouth in the morning.   fenofibrate 160 MG tablet Take 160 mg by mouth in the morning.   Fish Oil 1000 MG Caps Take 1,000 mg by mouth in the morning and at bedtime.   Flovent HFA 110 MCG/ACT inhaler Generic drug: fluticasone Inhale 2 puffs into the lungs 2 (two) times daily as needed (Shortness of breath).   furosemide 20 MG tablet Commonly known as: LASIX Take 1 tablet (20 mg total) by mouth daily. Start taking on: August 04, 2023 What changed:  medication strength how much to take   guaiFENesin 600 MG 12 hr tablet Commonly known as: MUCINEX Take 1 tablet (600 mg total) by mouth 2 (two) times daily for 5 days.   hydrOXYzine 25 MG tablet Commonly known as: ATARAX Take 1 tablet (25 mg total) by mouth every 8 (eight) hours as needed. What changed:  when to take this reasons to take this   ipratropium-albuterol 0.5-2.5 (3) MG/3ML Soln Commonly known as: DUONEB Take 3 mLs by nebulization in the morning and at bedtime.   melatonin 5 MG Tabs Take 5-19 mg by mouth at bedtime.   OXYGEN Inhale 2.5-3 L/min into the lungs as needed (for shortness of breath).   pantoprazole 40 MG tablet Commonly known as: PROTONIX Take 40 mg by mouth 2 (two) times daily.    vitamin E 180 MG (400 UNITS) capsule Take 400 Units by mouth daily.        Follow-up Information     [facility] Follow up.   Specialty: Home Health Services Contact information: [address] [place] [address] [phone]         Flossie Dibble, NP Follow up.   Specialty: Cardiology Why: Friday Aug 13, 2023 Appt at 10:05 AM (25 min) Contact information: [address] [place] [address] [phone]                Allergies  Allergen Reactions   Ace Inhibitors Cough    Consultations: Cardiology   Procedures/Studies: ECHOCARDIOGRAM COMPLETE  Result Date: 08/01/2023    ECHOCARDIOGRAM REPORT   Patient Name:   TIM DEPIANO Date of Exam: 08/01/2023 Medical Rec #:  [id number]       Height:       70.0 in Accession #:    [id number]  Weight:       147.5 lb Date of Birth:  August 27, 1936        BSA:          1.834 m Patient Age:    87 years        BP:           112/45 mmHg Patient Gender: M               HR:           105 bpm. Exam Location:  Inpatient Procedure: 2D Echo, Color Doppler, Cardiac Doppler and Intracardiac            Opacification Agent Indications:    I50.21 Acute systolic (congestive) heart failure  History:        Patient has prior history of Echocardiogram examinations, most                 recent 07/30/2021. COPD, Arrythmias:LBBB; Risk                 Factors:Hypertension, Diabetes and Dyslipidemia.  Sonographer:    Irving Burton Senior RDCS Referring Phys: [phone] Kacia Halley IMPRESSIONS  1. Left ventricular ejection fraction, by estimation, is 20 to 25%. The left ventricle has severely decreased function. The left ventricle demonstrates global hypokinesis. The left ventricular internal cavity size was mildly dilated. There is mild asymmetric left ventricular hypertrophy of the infero-lateral segment. Indeterminate diastolic filling due to E-A fusion.  2. Right ventricular systolic function is normal. The right ventricular size  is normal. Tricuspid regurgitation signal is inadequate for assessing PA pressure.  3. Left atrial size was mildly dilated.  4. The mitral valve is grossly normal. Trivial mitral valve regurgitation. No evidence of mitral stenosis.  5. The aortic valve is tricuspid. There is mild calcification of the aortic valve. Aortic valve regurgitation is trivial. Aortic valve sclerosis is present, with no evidence of aortic valve stenosis.  6. The inferior vena cava is dilated in size with <50% respiratory variability, suggesting right atrial pressure of 15 mmHg. Comparison(s): No significant change from prior study. Conclusion(s)/Recommendation(s): No left ventricular mural or apical thrombus/thrombi. FINDINGS  Left Ventricle: Left ventricular ejection fraction, by estimation, is 20 to 25%. The left ventricle has severely decreased function. The left ventricle demonstrates global hypokinesis. Definity contrast agent was given IV to delineate the left ventricular endocardial borders. The left ventricular internal cavity size was mildly dilated. There is mild asymmetric left ventricular hypertrophy of the infero-lateral segment. Abnormal (paradoxical) septal motion, consistent with left bundle branch block. Left ventricular diastolic function could not be evaluated due to atrial fibrillation. Indeterminate diastolic filling due to E-A fusion. Right Ventricle: The right ventricular size is normal. No increase in right ventricular wall thickness. Right ventricular systolic function is normal. Tricuspid regurgitation signal is inadequate for assessing PA pressure. Left Atrium: Left atrial size was mildly dilated. Right Atrium: Right atrial size was normal in size. Pericardium: Trivial pericardial effusion is present. Mitral Valve: The mitral valve is grossly normal. Trivial mitral valve regurgitation. No evidence of mitral valve stenosis. Tricuspid Valve: The tricuspid valve is normal in structure. Tricuspid valve regurgitation is  trivial. Aortic Valve: The aortic valve is tricuspid. There is mild calcification of the aortic valve. Aortic valve regurgitation is trivial. Aortic valve sclerosis is present, with no evidence of aortic valve stenosis. Pulmonic Valve: The pulmonic valve was grossly normal. Pulmonic valve regurgitation is trivial. No evidence of pulmonic stenosis. Aorta: The aortic root and ascending aorta are  structurally normal, with no evidence of dilitation. Venous: The inferior vena cava is dilated in size with less than 50% respiratory variability, suggesting right atrial pressure of 15 mmHg. IAS/Shunts: The atrial septum is grossly normal.  LEFT VENTRICLE PLAX 2D LVIDd:         5.50 cm LVIDs:         4.90 cm LV PW:         1.20 cm LV IVS:        0.80 cm LVOT diam:     2.00 cm LV SV:         40 LV SV Index:   22 LVOT Area:     3.14 cm  RIGHT VENTRICLE RV S prime:     11.30 cm/s TAPSE (M-mode): 1.7 cm LEFT ATRIUM             Index        RIGHT ATRIUM           Index LA diam:        4.50 cm 2.45 cm/m   RA Area:     19.80 cm LA Vol (A2C):   73.4 ml 40.02 ml/m  RA Volume:   54.60 ml  29.77 ml/m LA Vol (A4C):   53.8 ml 29.34 ml/m LA Biplane Vol: 64.6 ml 35.23 ml/m  AORTIC VALVE LVOT Vmax:   64.00 cm/s LVOT Vmean:  43.700 cm/s LVOT VTI:    0.127 m  AORTA Ao Root diam: 3.10 cm Ao Asc diam:  2.50 cm  SHUNTS Systemic VTI:  0.13 m Systemic Diam: 2.00 cm Lennie Odor MD Electronically signed by Lennie Odor MD Signature Date/Time: 08/01/2023/3:41:37 PM    Final    US Venous Img Lower Unilateral Right  Result Date: 07/31/2023 CLINICAL DATA:  Swelling. EXAM: Right LOWER EXTREMITY VENOUS DOPPLER ULTRASOUND TECHNIQUE: Gray-scale sonography with compression, as well as color and duplex ultrasound, were performed to evaluate the deep venous system(s) from the level of the common femoral vein through the popliteal and proximal calf veins. COMPARISON:  09/04/2019 FINDINGS: VENOUS Normal compressibility of the common femoral,  superficial femoral, and popliteal veins. Visualized portions of profunda femoral vein and great saphenous vein unremarkable. No filling defects to suggest DVT on grayscale or color Doppler imaging. Doppler waveforms show normal direction of venous flow, normal respiratory plasticity and response to augmentation. Limited views of the contralateral common femoral vein are unremarkable. OTHER Soft tissue edema along the calf region. Limitations: Limited evaluation of the calf vessels with the soft tissue edema. IMPRESSION: No evidence of right lower extremity DVT. Limited evaluation of the calf vessels with significant soft tissue edema as per the sonographer. Electronically Signed   By: Karen Kays M.D.   On: 07/31/2023 19:14   DG Chest Portable 1 View  Result Date: 07/31/2023 CLINICAL DATA:  Fever EXAM: PORTABLE CHEST 1 VIEW COMPARISON:  Chest x-ray 07/14/2023 FINDINGS: There are patchy airspace opacities in both lung bases, right greater than left. There is a small right pleural effusion. The heart is enlarged, unchanged. There is no pneumothorax or acute fracture. IMPRESSION: 1. Patchy airspace opacities in both lung bases, right greater than left, concerning for pneumonia. 2. Small right pleural effusion. 3. Cardiomegaly. Electronically Signed   By: Darliss Cheney M.D.   On: 07/31/2023 18:19   CT Head Wo Contrast  Result Date: 07/14/2023 CLINICAL DATA:  Altered mental status. EXAM: CT HEAD WITHOUT CONTRAST TECHNIQUE: Contiguous axial images were obtained from the base of the skull through  the vertex without intravenous contrast. RADIATION DOSE REDUCTION: This exam was performed according to the departmental dose-optimization program which includes automated exposure control, adjustment of the mA and/or kV according to patient size and/or use of iterative reconstruction technique. COMPARISON:  September 26, 2021 FINDINGS: Brain: There is mild to moderate severity cerebral atrophy with widening of the  extra-axial spaces and ventricular dilatation. There are areas of decreased attenuation within the white matter tracts of the supratentorial brain, consistent with microvascular disease changes. Cavum septum pellucidum et vergae is noted. A small, chronic right basal ganglia lacunar infarct is present. Vascular: Marked severity bilateral cavernous carotid artery calcification is seen. Skull: Normal. Negative for fracture or focal lesion. Sinuses/Orbits: There are small bilateral anterior maxillary sinus polyps versus mucous retention cysts. Mild bilateral ethmoid sinus mucosal thickening is also noted. Other: None. IMPRESSION: 1. Generalized cerebral atrophy with chronic white matter small vessel ischemic changes. 2. Small, chronic right basal ganglia lacunar infarct. 3. No acute intracranial abnormality. Electronically Signed   By: Aram Candela M.D.   On: 07/14/2023 23:40   DG Chest 2 View  Result Date: 07/14/2023 CLINICAL DATA:  Altered mental status and chills. EXAM: CHEST - 2 VIEW COMPARISON:  July 31, 2022 FINDINGS: The heart size and mediastinal contours are within normal limits. There is mild calcification of the aortic arch. Very mild linear atelectasis is seen within the left lung base. There is no evidence of an acute infiltrate, pleural effusion or pneumothorax. Multilevel degenerative changes seen throughout the thoracic spine. IMPRESSION: No active cardiopulmonary disease. Electronically Signed   By: Aram Candela M.D.   On: 07/14/2023 22:42      Subjective: Patient seen and examined at bedside today.  Hemodynamically stable.  On room air.  Denies any worsening shortness of breath or cough.  Very eager to go home.I called and discussed with son [name] on phone about dc planning  Discharge Exam: Vitals:   08/02/23 2238 08/03/23 0608  BP:  (!) 114/57  Pulse: 78 81  Resp:  16  Temp:  (!) 97.5 F (36.4 C)  SpO2: 99% 94%   Vitals:   08/02/23 1615 08/02/23 2218 08/02/23 2238  08/03/23 0608  BP: (!) 112/47 (!) 112/48  (!) 114/57  Pulse: 86 75 78 81  Resp: 19 18  16   Temp: 98.3 F (36.8 C) 97.8 F (36.6 C)  (!) 97.5 F (36.4 C)  TempSrc:  Oral  Oral  SpO2: 97% (!) 74% 99% 94%  Weight:    64.9 kg  Height:        General: Pt is alert, awake, not in acute distress Cardiovascular: RRR, S1/S2 +, no rubs, no gallops Respiratory: Mild bibasilar crackles abdominal: Soft, NT, ND, bowel sounds + Extremities: no edema, no cyanosis    The results of significant diagnostics from this hospitalization (including imaging, microbiology, ancillary and laboratory) are listed below for reference.     Microbiology: Recent Results (from the past 240 hour(s))  Culture, blood (routine x 2)     Status: None (Preliminary result)   Collection Time: 07/31/23  5:39 PM   Specimen: BLOOD  Result Value Ref Range Status   Specimen Description   Final    BLOOD LEFT ANTECUBITAL Performed at [facility], [address] [place], [place] [address]    Special Requests   Final    BOTTLES DRAWN AEROBIC AND ANAEROBIC Blood Culture results may not be optimal due to an inadequate volume of blood received in culture  bottles Performed at [facility], [address] [place], [place] [address]    Culture   Final    NO GROWTH 2 DAYS Performed at [facility], [address], [place], [place] [address]    Report Status PENDING  Incomplete  Culture, blood (routine x 2)     Status: None (Preliminary result)   Collection Time: 07/31/23  5:45 PM   Specimen: BLOOD  Result Value Ref Range Status   Specimen Description   Final    BLOOD BLOOD RIGHT WRIST Performed at [facility], [address] [place], [place] [address]    Special Requests   Final    BOTTLES DRAWN AEROBIC AND ANAEROBIC Blood Culture adequate volume Performed at [facility], [address] [place], [place] [address]    Culture   Final    NO GROWTH 2 DAYS Performed  at [facility], [address], [place], [place] [address]    Report Status PENDING  Incomplete  Resp panel by RT-PCR (RSV, Flu A&B, Covid) Anterior Nasal Swab     Status: None   Collection Time: 07/31/23  5:50 PM   Specimen: Anterior Nasal Swab  Result Value Ref Range Status   SARS Coronavirus 2 by RT PCR NEGATIVE NEGATIVE Final    Comment: (NOTE) SARS-CoV-2 target nucleic acids are NOT DETECTED.  The SARS-CoV-2 RNA is generally detectable in upper respiratory specimens during the acute phase of infection. The lowest concentration of SARS-CoV-2 viral copies this assay can detect is 138 copies/mL. A negative result does not preclude SARS-Cov-2 infection and should not be used as the sole basis for treatment or other patient management decisions. A negative result may occur with  improper specimen collection/handling, submission of specimen other than nasopharyngeal swab, presence of viral mutation(s) within the areas targeted by this assay, and inadequate number of viral copies(<138 copies/mL). A negative result must be combined with clinical observations, patient history, and epidemiological information. The expected result is Negative.  Fact Sheet for Patients:  BloggerCourse.com  Fact Sheet for Healthcare Providers:  SeriousBroker.it  This test is no t yet approved or cleared by the [place] FDA and  has been authorized for detection and/or diagnosis of SARS-CoV-2 by FDA under an Emergency Use Authorization (EUA). This EUA will remain  in effect (meaning this test can be used) for the duration of the COVID-19 declaration under Section 564(b)(1) of the Act, 21 U.S.C.section 360bbb-3(b)(1), unless the authorization is terminated  or revoked sooner.       Influenza A by PCR NEGATIVE NEGATIVE Final   Influenza B by PCR NEGATIVE NEGATIVE Final    Comment: (NOTE) The Xpert Xpress SARS-CoV-2/FLU/RSV plus assay is  intended as an aid in the diagnosis of influenza from Nasopharyngeal swab specimens and should not be used as a sole basis for treatment. Nasal washings and aspirates are unacceptable for Xpert Xpress SARS-CoV-2/FLU/RSV testing.  Fact Sheet for Patients: BloggerCourse.com  Fact Sheet for Healthcare Providers: SeriousBroker.it  This test is not yet approved or cleared by the [place] FDA and has been authorized for detection and/or diagnosis of SARS-CoV-2 by FDA under an Emergency Use Authorization (EUA). This EUA will remain in effect (meaning this test can be used) for the duration of the COVID-19 declaration under Section 564(b)(1) of the Act, 21 U.S.C. section 360bbb-3(b)(1), unless the authorization is terminated or revoked.     Resp Syncytial Virus by PCR NEGATIVE NEGATIVE Final  Comment: (NOTE) Fact Sheet for Patients: BloggerCourse.com  Fact Sheet for Healthcare Providers: SeriousBroker.it  This test is not yet approved or cleared by the [place] FDA and has been authorized for detection and/or diagnosis of SARS-CoV-2 by FDA under an Emergency Use Authorization (EUA). This EUA will remain in effect (meaning this test can be used) for the duration of the COVID-19 declaration under Section 564(b)(1) of the Act, 21 U.S.C. section 360bbb-3(b)(1), unless the authorization is terminated or revoked.  Performed at [facility], [address] [place], [place] [address]   MRSA Next Gen by PCR, Nasal     Status: None   Collection Time: 07/31/23  9:41 PM   Specimen: Nasal Mucosa; Nasal Swab  Result Value Ref Range Status   MRSA by PCR Next Gen NOT DETECTED NOT DETECTED Final    Comment: (NOTE) The GeneXpert MRSA Assay (FDA approved for NASAL specimens only), is one component of a comprehensive MRSA colonization surveillance program. It is not intended  to diagnose MRSA infection nor to guide or monitor treatment for MRSA infections. Test performance is not FDA approved in patients less than 44 years old. Performed at [facility], [address] [place], [place] [address]      Labs: BNP (last 3 results) Recent Labs    08/01/23 0826  BNP 2,372.2*   Basic Metabolic Panel: Recent Labs  Lab 07/31/23 1754 07/31/23 2335 08/01/23 0826 08/02/23 0831 08/03/23 0652  NA 138  --  133* 135 133*  K 4.2  --  3.8 3.4* 4.1  CL 104  --  101 100 97*  CO2  --   --  22 27 27   GLUCOSE 96  --  96 140* 94  BUN 16  --  17 22 31*  CREATININE 1.30*  --  1.42* 1.55* 1.91*  CALCIUM  --   --  8.6* 8.5* 8.5*  MG  --  1.5* 1.9  --   --    Liver Function Tests: No results for input(s): "AST", "ALT", "ALKPHOS", "BILITOT", "PROT", "ALBUMIN" in the last 168 hours. No results for input(s): "LIPASE", "AMYLASE" in the last 168 hours. No results for input(s): "AMMONIA" in the last 168 hours. CBC: Recent Labs  Lab 07/31/23 1739 07/31/23 1754 08/01/23 0826 08/02/23 0831  WBC 14.1*  --  16.6* 10.2  NEUTROABS 12.9*  --   --   --   HGB 10.0* 11.2* 8.9* 9.4*  HCT 31.6* 33.0* 28.4* 29.1*  MCV 91.6  --  91.0 90.9  PLT 434*  --  367 391   Cardiac Enzymes: No results for input(s): "CKTOTAL", "CKMB", "CKMBINDEX", "TROPONINI" in the last 168 hours. BNP: Invalid input(s): "POCBNP" CBG: No results for input(s): "GLUCAP" in the last 168 hours. D-Dimer No results for input(s): "DDIMER" in the last 72 hours. Hgb A1c No results for input(s): "HGBA1C" in the last 72 hours. Lipid Profile No results for input(s): "CHOL", "HDL", "LDLCALC", "TRIG", "CHOLHDL", "LDLDIRECT" in the last 72 hours. Thyroid function studies No results for input(s): "TSH", "T4TOTAL", "T3FREE", "THYROIDAB" in the last 72 hours.  Invalid input(s): "FREET3" Anemia work up No results for input(s): "VITAMINB12", "FOLATE", "FERRITIN", "TIBC", "IRON", "RETICCTPCT" in the last 72  hours. Urinalysis    Component Value Date/Time   COLORURINE YELLOW 08/01/2023 0031   APPEARANCEUR CLEAR 08/01/2023 0031   LABSPEC 1.011 08/01/2023 0031   PHURINE 5.0 08/01/2023 0031   GLUCOSEU NEGATIVE 08/01/2023 0031   HGBUR NEGATIVE 08/01/2023 0031   BILIRUBINUR  NEGATIVE 08/01/2023 0031   KETONESUR NEGATIVE 08/01/2023 0031   PROTEINUR NEGATIVE 08/01/2023 0031   NITRITE NEGATIVE 08/01/2023 0031   LEUKOCYTESUR NEGATIVE 08/01/2023 0031   Sepsis Labs Recent Labs  Lab 07/31/23 1739 08/01/23 0826 08/02/23 0831  WBC 14.1* 16.6* 10.2   Microbiology Recent Results (from the past 240 hour(s))  Culture, blood (routine x 2)     Status: None (Preliminary result)   Collection Time: 07/31/23  5:39 PM   Specimen: BLOOD  Result Value Ref Range Status   Specimen Description   Final    BLOOD LEFT ANTECUBITAL Performed at [facility], [address] [place], [place] [address]    Special Requests   Final    BOTTLES DRAWN AEROBIC AND ANAEROBIC Blood Culture results may not be optimal due to an inadequate volume of blood received in culture bottles Performed at [facility], [address] [place], [place] [address]    Culture   Final    NO GROWTH 2 DAYS Performed at [facility], [address], [place], [place] [address]    Report Status PENDING  Incomplete  Culture, blood (routine x 2)     Status: None (Preliminary result)   Collection Time: 07/31/23  5:45 PM   Specimen: BLOOD  Result Value Ref Range Status   Specimen Description   Final    BLOOD BLOOD RIGHT WRIST Performed at [facility], [address] [place], [place] [address]    Special Requests   Final    BOTTLES DRAWN AEROBIC AND ANAEROBIC Blood Culture adequate volume Performed at [facility], [address] [place], [place] [address]    Culture   Final    NO GROWTH 2 DAYS Performed at [facility], [address], [place], [place] [address]    Report  Status PENDING  Incomplete  Resp panel by RT-PCR (RSV, Flu A&B, Covid) Anterior Nasal Swab     Status: None   Collection Time: 07/31/23  5:50 PM   Specimen: Anterior Nasal Swab  Result Value Ref Range Status   SARS Coronavirus 2 by RT PCR NEGATIVE NEGATIVE Final    Comment: (NOTE) SARS-CoV-2 target nucleic acids are NOT DETECTED.  The SARS-CoV-2 RNA is generally detectable in upper respiratory specimens during the acute phase of infection. The lowest concentration of SARS-CoV-2 viral copies this assay can detect is 138 copies/mL. A negative result does not preclude SARS-Cov-2 infection and should not be used as the sole basis for treatment or other patient management decisions. A negative result may occur with  improper specimen collection/handling, submission of specimen other than nasopharyngeal swab, presence of viral mutation(s) within the areas targeted by this assay, and inadequate number of viral copies(<138 copies/mL). A negative result must be combined with clinical observations, patient history, and epidemiological information. The expected result is Negative.  Fact Sheet for Patients:  BloggerCourse.com  Fact Sheet for Healthcare Providers:  SeriousBroker.it  This test is no t yet approved or cleared by the [place] FDA and  has been authorized for detection and/or diagnosis of SARS-CoV-2 by FDA under an Emergency Use Authorization (EUA). This EUA will remain  in effect (meaning this test can be used) for the duration of the COVID-19 declaration under Section 564(b)(1) of the Act, 21 U.S.C.section 360bbb-3(b)(1), unless the authorization is terminated  or revoked sooner.       Influenza A by PCR NEGATIVE NEGATIVE Final  Influenza B by PCR NEGATIVE NEGATIVE Final    Comment: (NOTE) The Xpert Xpress SARS-CoV-2/FLU/RSV plus assay is intended as an aid in the diagnosis of influenza from Nasopharyngeal swab  specimens and should not be used as a sole basis for treatment. Nasal washings and aspirates are unacceptable for Xpert Xpress SARS-CoV-2/FLU/RSV testing.  Fact Sheet for Patients: BloggerCourse.com  Fact Sheet for Healthcare Providers: SeriousBroker.it  This test is not yet approved or cleared by the [place] FDA and has been authorized for detection and/or diagnosis of SARS-CoV-2 by FDA under an Emergency Use Authorization (EUA). This EUA will remain in effect (meaning this test can be used) for the duration of the COVID-19 declaration under Section 564(b)(1) of the Act, 21 U.S.C. section 360bbb-3(b)(1), unless the authorization is terminated or revoked.     Resp Syncytial Virus by PCR NEGATIVE NEGATIVE Final    Comment: (NOTE) Fact Sheet for Patients: BloggerCourse.com  Fact Sheet for Healthcare Providers: SeriousBroker.it  This test is not yet approved or cleared by the [place] FDA and has been authorized for detection and/or diagnosis of SARS-CoV-2 by FDA under an Emergency Use Authorization (EUA). This EUA will remain in effect (meaning this test can be used) for the duration of the COVID-19 declaration under Section 564(b)(1) of the Act, 21 U.S.C. section 360bbb-3(b)(1), unless the authorization is terminated or revoked.  Performed at [facility], [address] [place], [place] [address]   MRSA Next Gen by PCR, Nasal     Status: None   Collection Time: 07/31/23  9:41 PM   Specimen: Nasal Mucosa; Nasal Swab  Result Value Ref Range Status   MRSA by PCR Next Gen NOT DETECTED NOT DETECTED Final    Comment: (NOTE) The GeneXpert MRSA Assay (FDA approved for NASAL specimens only), is one component of a comprehensive MRSA colonization surveillance program. It is not intended to diagnose MRSA infection nor to guide or monitor treatment for MRSA  infections. Test performance is not FDA approved in patients less than 70 years old. Performed at [facility], [address], [place], [place] [address]     Please note: You were cared for by a hospitalist during your hospital stay. Once you are discharged, your primary care physician will handle any further medical issues. Please note that NO REFILLS for any discharge medications will be authorized once you are discharged, as it is imperative that you return to your primary care physician (or establish a relationship with a primary care physician if you do not have one) for your post hospital discharge needs so that they can reassess your need for medications and monitor your lab values.    Time coordinating discharge: 40 minutes  SIGNED:   Burnadette Pop, MD  [facility] 08/03/2023, 2:18 PM Pager [phone]  If 7PM-7AM, please contact night-coverage [email or web address] Password [email or web address]

## 2023-08-03 NOTE — Progress Notes (Signed)
Physical Therapy Treatment Patient Details Name: Harry Norris MRN: [id number] DOB: July 20, 1936 Today's Date: 08/03/2023   History of Present Illness 87 y.o. male presents to [facility] 07/31/23 w/ coughing spells. Admitted w/ PNA and fluid overload, negative R LE DVT study. Recent admit 11/13-11/15 w/ acute metabolic encephalopathy and diarrhea. PMHx: DM, COPD, chronic hypoxic respiratory failure on 2L [place] PRN, cardiomyopathy EF 35%, HLD, HTN, HLD, prostate cancer.    PT Comments  Pt received in supine, agreeable to therapy session with encouragement, pt c/o feeling cold throughout. Pt needing up to minA for initial sit>stand transfer then after repetition of cues for safe UE placement and use of rollator brakes, pt progressed to CGA/Supervision for transfers/gait longer household distance with rollator. Pt needing CGA to minA when not using AD and less stable, so pt heavily encouraged to use rollator at all times once home until HHPT clears him from needing to use assistive device, pt agreeable. Pt also performed stairs with single rail and CGA at most, no buckling. Pt needs increased time and effort to perform functional tasks and remains a high fall risk, he will benefit from increased PRN supervision/assist at home initially and [facility] services. Pt continues to benefit from PT services to progress toward functional mobility goals.    If plan is discharge home, recommend the following: A little help with walking and/or transfers;Help with stairs or ramp for entrance;Assist for transportation   Can travel by private vehicle     Yes  Equipment Recommendations  None recommended by PT (pt has rollator at home)    Recommendations for Other Services       Precautions / Restrictions Precautions Precautions: Fall Precaution Comments: incontinent B/B Restrictions Weight Bearing Restrictions: No     Mobility  Bed Mobility Overal bed mobility: Needs Assistance Bed Mobility: Supine to Sit     Supine  to sit: Supervision, Used rails, HOB elevated     General bed mobility comments: increased time/effort    Transfers Overall transfer level: Needs assistance Equipment used: None, Rollator (4 wheels) Transfers: Sit to/from Stand Sit to Stand: Supervision, Contact guard assist           General transfer comment: from EOB initally minA without AD due to posterior bias upon standing, then CGA for subsequent transfers (to toilet and recliner) with cues for safe UE placement and pt using rollator, needs reminders for brakes    Ambulation/Gait Ambulation/Gait assistance: Contact guard assist, Supervision Gait Distance (Feet): 300 Feet (7ft wtih no AD and CGA, then 374ft supervision with rollator) Assistive device: Rollator (4 wheels), None Gait Pattern/deviations: Step-through pattern, Wide base of support, Drifts right/left       General Gait Details: increased medial/lateral sway w/ short steps and pt needing to use door handle and wall for stability without AD, and CGA to light minA for stability/safety. after 70ft with no AD, PTA brought rollator to room and pt needing only Supervision using Rollator support and cues for use of brakes   Stairs Stairs: Yes Stairs assistance: Contact guard assist, Supervision Stair Management: One rail Left, Step to pattern, Forwards Number of Stairs: 5 General stair comments: cues for safety and sequencing with good carryover, pt heavily reliant on wall rail, no buckling and fair safety. CGA to Supervision for pt safety but not needing HHA.   Wheelchair Mobility     Tilt Bed    Modified Rankin (Stroke Patients Only)       Balance Overall balance assessment: Mild deficits observed, not  formally tested                                          Cognition Arousal: Alert Behavior During Therapy: [facility] for tasks assessed/performed Overall Cognitive Status: Within Functional Limits for tasks assessed                            Safety/Judgement: Decreased awareness of safety, Decreased awareness of deficits   Problem Solving: Requires verbal cues General Comments: needs reminders to push from surfaces and to reach back prior to sitting, as well as for use of brakes on rollator before/after transfers.        Exercises Other Exercises Other Exercises: seated BLE AROM: LAQ, ankle pumps x10 reps ea Other Exercises: STS x3 reps from recliner, cues for safe UE placement each rep    General Comments General comments (skin integrity, edema, etc.): no acute s/sx distress other than c/o feeling cold, poor signal on pulse oximeter, reading 90% but may be inaccurate as poor signal and pt hands cold, no significant dyspnea      Pertinent Vitals/Pain Pain Assessment Pain Assessment: No/denies pain Faces Pain Scale: No hurt    Home Living   Prior Function    PT Goals (current goals can now be found in the care plan section) Acute Rehab PT Goals Patient Stated Goal: to go home PT Goal Formulation: With patient Time For Goal Achievement: 08/15/23 Progress towards PT goals: Progressing toward goals    Frequency    Min 1X/week      PT Plan         AM-PAC PT "6 Clicks" Mobility   Outcome Measure  Help needed turning from your back to your side while in a flat bed without using bedrails?: None Help needed moving from lying on your back to sitting on the side of a flat bed without using bedrails?: A Little Help needed moving to and from a bed to a chair (including a wheelchair)?: A Little Help needed standing up from a chair using your arms (e.g., wheelchair or bedside chair)?: A Little Help needed to walk in hospital room?: A Little Help needed climbing 3-5 steps with a railing? : A Little 6 Click Score: 19    End of Session Equipment Utilized During Treatment: Gait belt Activity Tolerance: Patient tolerated treatment well Patient left: in chair;with call bell/phone within reach;with  chair alarm set;Other (comment) (pt assisted to order chicken soup, he did not appear to eat much of his breakfast or lunch meals) Nurse Communication: Mobility status PT Visit Diagnosis: Unsteadiness on feet (R26.81);Muscle weakness (generalized) (M62.81);Difficulty in walking, not elsewhere classified (R26.2)     Time: 1478-2956 PT Time Calculation (min) (ACUTE ONLY): 30 min  Charges:    $Gait Training: 8-22 mins $Therapeutic Activity: 8-22 mins PT General Charges $$ ACUTE PT VISIT: 1 Visit                     Abisola Carrero P., PTA Acute Rehabilitation Services Secure Chat Preferred 9a-5:30pm Office: [phone]    Dorathy Kinsman [facility] 08/03/2023, 3:57 PM

## 2023-08-03 NOTE — Progress Notes (Signed)
Heart Failure Navigator Progress Note  Assessed for Heart & Vascular TOC clinic readiness.  Patient EF 20-25%, has a scheduled [facility] appointment on 08/13/2023, patient refusing SNF at this time. .   Navigator will sign off at this time.   Rhae Hammock, BSN, Scientist, clinical (histocompatibility and immunogenetics) Only

## 2023-08-03 NOTE — Plan of Care (Signed)
  Problem: Education: Goal: Knowledge of General Education information will improve Description: Including pain rating scale, medication(s)/side effects and non-pharmacologic comfort measures 08/03/2023 0928 by Darden Amber, RN Outcome: Progressing 08/03/2023 0928 by Darden Amber, RN Outcome: Progressing   Problem: Health Behavior/Discharge Planning: Goal: Ability to manage health-related needs will improve Outcome: Progressing   Problem: Clinical Measurements: Goal: Ability to maintain clinical measurements within normal limits will improve Outcome: Progressing Goal: Will remain free from infection Outcome: Progressing Goal: Diagnostic test results will improve Outcome: Progressing Goal: Respiratory complications will improve Outcome: Progressing Goal: Cardiovascular complication will be avoided Outcome: Progressing   Problem: Activity: Goal: Risk for activity intolerance will decrease Outcome: Progressing   Problem: Nutrition: Goal: Adequate nutrition will be maintained Outcome: Progressing   Problem: Coping: Goal: Level of anxiety will decrease Outcome: Progressing   Problem: Elimination: Goal: Will not experience complications related to bowel motility Outcome: Progressing Goal: Will not experience complications related to urinary retention Outcome: Progressing   Problem: Pain Management: Goal: General experience of comfort will improve Outcome: Progressing   Problem: Safety: Goal: Ability to remain free from injury will improve Outcome: Progressing   Problem: Skin Integrity: Goal: Risk for impaired skin integrity will decrease Outcome: Progressing

## 2023-08-03 NOTE — Consult Note (Signed)
Cardiology Consultation   Patient ID: Harry Norris MRN: [id number]; DOB: [birth year]  Admit date: 07/31/2023 Date of Consult: 08/03/2023  PCP:  Paulina Fusi, MD   [facility] Providers Cardiologist:  None   {  Patient Profile:   Harry Norris is a 87 y.o. male with a hx of chronic HFrEF, left bundle branch block, PAD, chronic venous insufficiency, AAA previously followed by vascular surgery, prostate cancer, COPD, CKD, hypertension, hyperlipidemia, prostate cancer, who is being seen 08/03/2023 for the evaluation of CHF at the request of Dr. Renford Dills.  History of Present Illness:   Harry Norris is followed by Dr. Dulce Sellar in [facility] for chronic HFrEF.  He has had previous cardiac catheterization in 2011, unknown results of this.  Also does not appear that he has been on any GDMT, previously was on losartan however this was not present on his PTA medications.  EF has persistently been depressed in gradually declining since at least 2019 when it was 35%.  Currently patient being evaluated for nosocomial pneumonia.  He has recent admission for acute metabolic encephalopathy and was discharged home at patient's request.  Son and patient reported coughing and fever.  Chest x-ray and showing possible pneumonia, started on antibiotics.  He was transferred from [facility] ER to [facility] for further evaluation. On admission he was noted to be volume up and started on IV Lasix 40 mg twice daily.  Cardiology has been asked to assess volume status with possible plans for discharge.   Patient feels back to baseline and does not describe any complaints.  Denied any chest pain, shortness of breath, peripheral edema, dizziness, falls, orthopnea, palpitations.  He states that he lives at home alone his is independent and functional.  He ambulates around the house without significant difficulty and even works out in his garden.  He has a son that also lives in [place] that  occasionally comes and sees him.  This PTA med show p.o. Lasix 40 mg however he does not recall taking this and is not familiar with the term diuretic.  BNP 2322.  Creatinine 1.91 and has continued to increase from 1.3 on admission.  Potassium 4.1.  BUN 31.  RBC 3.2.  Hemoglobin 9.4.   Past Medical History:  Diagnosis Date   AAA (abdominal aortic aneurysm) (HCC)    Acute hypoxemic respiratory failure (HCC) 07/12/2021   Anemia    Asthma 02/07/2018   Benign neoplasm of colon    Blood in the stool    Bradycardia 12/19/2018   Bronchitis    Cancer (HCC)    prostate   Cardiomyopathy (HCC) 12/18/2015   Carotid artery occlusion    Chronic obstructive pulmonary disease with acute exacerbation (HCC) 07/12/2021   Chronic rhinitis 02/26/2016   Chronic venous insufficiency 12/18/2015   CKD (chronic kidney disease), stage III (HCC) 07/12/2021   Community acquired pneumonia of right lower lobe of lung 07/12/2021   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   PFTs 01/18/14:  FeV1 1.58 45% FVC 2.0 50% FeV1/FVC 79% Fef 25-75 795 ( 40% improved after BD) CXR: Copd changes ONO 03/07/14 desats to <88% on RA    Diarrhea    Diverticulitis    DM type 2 (diabetes mellitus, type 2) (HCC)    patient denies every being diagnosed with diabetes   Fatigue 12/12/2018   Hyperlipidemia    Hypertension    Hypertensive heart disease    Hypoxemia 04/10/2014   Overview:  Last  Assessment & Plan:  Cont nocturnal oxygen therapy 2L    Influenza 07/29/2021   Kidney stone    LBBB (left bundle branch block) 12/18/2015   Leg swelling 11/28/2013   Orthostatic hypotension 12/19/2018   Other symptoms involving cardiovascular system 11/28/2013   Pressure injury of skin 07/30/2021   Prostate cancer [facility])    prostate    Renal insufficiency 09/26/2021   Respiratory failure (HCC) 07/29/2021   Sepsis (HCC) 07/12/2021   Syncope 12/18/2015   Varicose veins of lower extremities with other complications 11/28/2013    Past  Surgical History:  Procedure Laterality Date    cardiac catheterization  12/29/2009   CATARACT EXTRACTION Bilateral    ECTROPION REPAIR Bilateral 03/27/2022   Procedure: REPAIR OF ECTROPION;  Surgeon: Dairl Ponder, MD;  Location: [facility] OR;  Service: Ophthalmology;  Laterality: Bilateral;   ENDOVENOUS ABLATION SAPHENOUS VEIN W/ LASER Right 03/29/2014   EVLA right greater saphenous vein     gold seed implants  08/31/2009   treatment of prostate cancer   HERNIA REPAIR Bilateral    LITHOTRIPSY  08/31/2008   PROSTATE SURGERY     pt reports he "think they removed" his prostate   right ear surgery       Inpatient Medications: Scheduled Meds:  acidophilus  2 capsule Oral Daily   aspirin EC  81 mg Oral q AM   atorvastatin  40 mg Oral QPM   fenofibrate  160 mg Oral q AM   guaiFENesin  600 mg Oral BID   heparin injection (subcutaneous)  5,000 Units Subcutaneous Q8H   lidocaine  1 patch Transdermal Q24H   Continuous Infusions:  [START ON 08/04/2023] ceFEPime (MAXIPIME) IV     PRN Meds: acetaminophen, albuterol, benzonatate, ondansetron (ZOFRAN) IV, mouth rinse  Allergies:    Allergies  Allergen Reactions   Ace Inhibitors Cough    Social History:   Social History   Socioeconomic History   Marital status: Married    Spouse name: Not on file   Number of children: Not on file   Years of education: Not on file   Highest education level: Not on file  Occupational History   Not on file  Tobacco Use   Smoking status: Former    Current packs/day: 0.00    Average packs/day: 2.5 packs/day for 40.0 years (100.0 ttl pk-yrs)    Types: Cigarettes    Start date: 09/01/1959    Quit date: 09/01/1999    Years since quitting: 23.9   Smokeless tobacco: Never  Vaping Use   Vaping status: Never Used  Substance and Sexual Activity   Alcohol use: No   Drug use: No   Sexual activity: Not on file  Other Topics Concern   Not on file  Social History Narrative   Lives with wife.   Retired.    Telephone business prior.   Social Determinants of Health   Financial Resource Strain: Not on file  Food Insecurity: No Food Insecurity (07/31/2023)   Hunger Vital Sign    Worried About Running Out of Food in the Last Year: Never true    Ran Out of Food in the Last Year: Never true  Transportation Needs: No Transportation Needs (07/31/2023)   PRAPARE - Administrator, Civil Service (Medical): No    Lack of Transportation (Non-Medical): No  Physical Activity: Not on file  Stress: Not on file  Social Connections: Not on file  Intimate Partner Violence: Unknown (07/31/2023)   Humiliation, Afraid, Rape, and  Kick questionnaire    Fear of Current or Ex-Partner: No    Emotionally Abused: No    Physically Abused: No    Sexually Abused: Patient declined    Family History:   Family History  Problem Relation Age of Onset   Cancer - Colon Mother        deceased   Hypertension Mother    Diabetes Mother    Emphysema Sister        deceased   Heart disease Sister    Asthma Sister    Kidney failure Father    Heart failure Father      ROS:  Please see the history of present illness.  All other ROS reviewed and negative.     Physical Exam/Data:   Vitals:   08/02/23 1615 08/02/23 2218 08/02/23 2238 08/03/23 0608  BP: (!) 112/47 (!) 112/48  (!) 114/57  Pulse: 86 75 78 81  Resp: 19 18  16   Temp: 98.3 F (36.8 C) 97.8 F (36.6 C)  (!) 97.5 F (36.4 C)  TempSrc:  Oral  Oral  SpO2: 97% (!) 74% 99% 94%  Weight:    64.9 kg  Height:        Intake/Output Summary (Last 24 hours) at 08/03/2023 1306 Last data filed at 08/03/2023 0900 Gross per 24 hour  Intake 360.39 ml  Output 1400 ml  Net -1039.61 ml      08/03/2023    6:08 AM 08/02/2023    4:11 AM 08/01/2023    5:00 AM  Last 3 Weights  Weight (lbs) 143 lb 1.3 oz 141 lb 15.6 oz 147 lb 7.8 oz  Weight (kg) 64.9 kg 64.4 kg 66.9 kg     Body mass index is 20.53 kg/m.  General:  Well nourished, well developed, in no  acute distress.  HEENT: normal Neck: no JVD Vascular: No carotid bruits; Distal pulses 2+ bilaterally Cardiac:  2/6 murmur.  Irregular likely due to PVCs and PACs. Lungs: Crackles Abd: soft, nontender, no hepatomegaly  Ext: no edema Musculoskeletal:  No deformities, BUE and BLE strength normal and equal Skin: warm and dry.  Decreased skin turgor Neuro:  CNs 2-12 intact, no focal abnormalities noted Psych:  Normal affect   EKG:  The EKG was personally reviewed and demonstrates: Sinus rhythm, heart rate 85, PACs and PVCs.  Left bundle branch block. Telemetry:  Telemetry was personally reviewed and demonstrates: Normal sinus rhythm with heart rates in the 80s, PACs and PVCs lungs.  Relevant CV Studies: Echocardiogram 08/01/2023 1. Left ventricular ejection fraction, by estimation, is 20 to 25%. The  left ventricle has severely decreased function. The left ventricle  demonstrates global hypokinesis. The left ventricular internal cavity size  was mildly dilated. There is mild  asymmetric left ventricular hypertrophy of the infero-lateral segment.  Indeterminate diastolic filling due to E-A fusion.   2. Right ventricular systolic function is normal. The right ventricular  size is normal. Tricuspid regurgitation signal is inadequate for assessing  PA pressure.   3. Left atrial size was mildly dilated.   4. The mitral valve is grossly normal. Trivial mitral valve  regurgitation. No evidence of mitral stenosis.   5. The aortic valve is tricuspid. There is mild calcification of the  aortic valve. Aortic valve regurgitation is trivial. Aortic valve  sclerosis is present, with no evidence of aortic valve stenosis.   6. The inferior vena cava is dilated in size with <50% respiratory  variability, suggesting right atrial pressure of 15 mmHg.  Comparison(s): No significant change from prior study.   Conclusion(s)/Recommendation(s): No left ventricular mural or apical  thrombus/thrombi.    Laboratory Data:  High Sensitivity Troponin:  No results for input(s): "TROPONINIHS" in the last 720 hours.   Chemistry Recent Labs  Lab 07/31/23 2335 08/01/23 0826 08/02/23 0831 08/03/23 0652  NA  --  133* 135 133*  K  --  3.8 3.4* 4.1  CL  --  101 100 97*  CO2  --  22 27 27   GLUCOSE  --  96 140* 94  BUN  --  17 22 31*  CREATININE  --  1.42* 1.55* 1.91*  CALCIUM  --  8.6* 8.5* 8.5*  MG 1.5* 1.9  --   --   GFRNONAA  --  48* 44* 34*  ANIONGAP  --  10 8 9     No results for input(s): "PROT", "ALBUMIN", "AST", "ALT", "ALKPHOS", "BILITOT" in the last 168 hours. Lipids No results for input(s): "CHOL", "TRIG", "HDL", "LABVLDL", "LDLCALC", "CHOLHDL" in the last 168 hours.  Hematology Recent Labs  Lab 07/31/23 1739 07/31/23 1754 08/01/23 0826 08/02/23 0831  WBC 14.1*  --  16.6* 10.2  RBC 3.45*  --  3.12* 3.20*  HGB 10.0* 11.2* 8.9* 9.4*  HCT 31.6* 33.0* 28.4* 29.1*  MCV 91.6  --  91.0 90.9  MCH 29.0  --  28.5 29.4  MCHC 31.6  --  31.3 32.3  RDW 17.3*  --  17.2* 17.1*  PLT 434*  --  367 391   Thyroid No results for input(s): "TSH", "FREET4" in the last 168 hours.  BNP Recent Labs  Lab 08/01/23 0826  BNP 2,372.2*    DDimer No results for input(s): "DDIMER" in the last 168 hours.   Radiology/Studies:  ECHOCARDIOGRAM COMPLETE  Result Date: 08/01/2023    ECHOCARDIOGRAM REPORT   Patient Name:   Harry Norris Molner Date of Exam: 08/01/2023 Medical Rec #:  [id number]       Height:       70.0 in Accession #:    [id number]      Weight:       147.5 lb Date of Birth:  05-Jul-1936        BSA:          1.834 m Patient Age:    87 years        BP:           112/45 mmHg Patient Gender: M               HR:           105 bpm. Exam Location:  Inpatient Procedure: 2D Echo, Color Doppler, Cardiac Doppler and Intracardiac            Opacification Agent Indications:    I50.21 Acute systolic (congestive) heart failure  History:        Patient has prior history of Echocardiogram examinations, most                  recent 07/30/2021. COPD, Arrythmias:LBBB; Risk                 Factors:Hypertension, Diabetes and Dyslipidemia.  Sonographer:    Irving Burton Senior RDCS Referring Phys: [phone] AMRIT ADHIKARI IMPRESSIONS  1. Left ventricular ejection fraction, by estimation, is 20 to 25%. The left ventricle has severely decreased function. The left ventricle demonstrates global hypokinesis. The left ventricular internal cavity size was mildly dilated. There is mild asymmetric left ventricular hypertrophy of the infero-lateral  segment. Indeterminate diastolic filling due to E-A fusion.  2. Right ventricular systolic function is normal. The right ventricular size is normal. Tricuspid regurgitation signal is inadequate for assessing PA pressure.  3. Left atrial size was mildly dilated.  4. The mitral valve is grossly normal. Trivial mitral valve regurgitation. No evidence of mitral stenosis.  5. The aortic valve is tricuspid. There is mild calcification of the aortic valve. Aortic valve regurgitation is trivial. Aortic valve sclerosis is present, with no evidence of aortic valve stenosis.  6. The inferior vena cava is dilated in size with <50% respiratory variability, suggesting right atrial pressure of 15 mmHg. Comparison(s): No significant change from prior study. Conclusion(s)/Recommendation(s): No left ventricular mural or apical thrombus/thrombi. FINDINGS  Left Ventricle: Left ventricular ejection fraction, by estimation, is 20 to 25%. The left ventricle has severely decreased function. The left ventricle demonstrates global hypokinesis. Definity contrast agent was given IV to delineate the left ventricular endocardial borders. The left ventricular internal cavity size was mildly dilated. There is mild asymmetric left ventricular hypertrophy of the infero-lateral segment. Abnormal (paradoxical) septal motion, consistent with left bundle branch block. Left ventricular diastolic function could not be evaluated due to atrial  fibrillation. Indeterminate diastolic filling due to E-A fusion. Right Ventricle: The right ventricular size is normal. No increase in right ventricular wall thickness. Right ventricular systolic function is normal. Tricuspid regurgitation signal is inadequate for assessing PA pressure. Left Atrium: Left atrial size was mildly dilated. Right Atrium: Right atrial size was normal in size. Pericardium: Trivial pericardial effusion is present. Mitral Valve: The mitral valve is grossly normal. Trivial mitral valve regurgitation. No evidence of mitral valve stenosis. Tricuspid Valve: The tricuspid valve is normal in structure. Tricuspid valve regurgitation is trivial. Aortic Valve: The aortic valve is tricuspid. There is mild calcification of the aortic valve. Aortic valve regurgitation is trivial. Aortic valve sclerosis is present, with no evidence of aortic valve stenosis. Pulmonic Valve: The pulmonic valve was grossly normal. Pulmonic valve regurgitation is trivial. No evidence of pulmonic stenosis. Aorta: The aortic root and ascending aorta are structurally normal, with no evidence of dilitation. Venous: The inferior vena cava is dilated in size with less than 50% respiratory variability, suggesting right atrial pressure of 15 mmHg. IAS/Shunts: The atrial septum is grossly normal.  LEFT VENTRICLE PLAX 2D LVIDd:         5.50 cm LVIDs:         4.90 cm LV PW:         1.20 cm LV IVS:        0.80 cm LVOT diam:     2.00 cm LV SV:         40 LV SV Index:   22 LVOT Area:     3.14 cm  RIGHT VENTRICLE RV S prime:     11.30 cm/s TAPSE (M-mode): 1.7 cm LEFT ATRIUM             Index        RIGHT ATRIUM           Index LA diam:        4.50 cm 2.45 cm/m   RA Area:     19.80 cm LA Vol (A2C):   73.4 ml 40.02 ml/m  RA Volume:   54.60 ml  29.77 ml/m LA Vol (A4C):   53.8 ml 29.34 ml/m LA Biplane Vol: 64.6 ml 35.23 ml/m  AORTIC VALVE LVOT Vmax:   64.00 cm/s LVOT Vmean:  43.700 cm/s LVOT  VTI:    0.127 m  AORTA Ao Root diam: 3.10 cm  Ao Asc diam:  2.50 cm  SHUNTS Systemic VTI:  0.13 m Systemic Diam: 2.00 cm Lennie Odor MD Electronically signed by Lennie Odor MD Signature Date/Time: 08/01/2023/3:41:37 PM    Final    US Venous Img Lower Unilateral Right  Result Date: 07/31/2023 CLINICAL DATA:  Swelling. EXAM: Right LOWER EXTREMITY VENOUS DOPPLER ULTRASOUND TECHNIQUE: Gray-scale sonography with compression, as well as color and duplex ultrasound, were performed to evaluate the deep venous system(s) from the level of the common femoral vein through the popliteal and proximal calf veins. COMPARISON:  09/04/2019 FINDINGS: VENOUS Normal compressibility of the common femoral, superficial femoral, and popliteal veins. Visualized portions of profunda femoral vein and great saphenous vein unremarkable. No filling defects to suggest DVT on grayscale or color Doppler imaging. Doppler waveforms show normal direction of venous flow, normal respiratory plasticity and response to augmentation. Limited views of the contralateral common femoral vein are unremarkable. OTHER Soft tissue edema along the calf region. Limitations: Limited evaluation of the calf vessels with the soft tissue edema. IMPRESSION: No evidence of right lower extremity DVT. Limited evaluation of the calf vessels with significant soft tissue edema as per the sonographer. Electronically Signed   By: Karen Kays M.D.   On: 07/31/2023 19:14   DG Chest Portable 1 View  Result Date: 07/31/2023 CLINICAL DATA:  Fever EXAM: PORTABLE CHEST 1 VIEW COMPARISON:  Chest x-ray 07/14/2023 FINDINGS: There are patchy airspace opacities in both lung bases, right greater than left. There is a small right pleural effusion. The heart is enlarged, unchanged. There is no pneumothorax or acute fracture. IMPRESSION: 1. Patchy airspace opacities in both lung bases, right greater than left, concerning for pneumonia. 2. Small right pleural effusion. 3. Cardiomegaly. Electronically Signed   By: Darliss Cheney  M.D.   On: 07/31/2023 18:19     Assessment and Plan:   Chronic HFrEF 20-25% Has had gradually declining EF since 2019.  Uncertain etiology of cardiomyopathy, has left bundle branch block and a heart catheterization in 2011 with unknown results.  EF this admission shows 20 to 25% with normal RV function.  He was started on IV Lasix 40 mg twice daily.  Now he looks euvolemic and actually very dry.  Rising creatinine BUN support sufficient diuresis.  He is asymptomatic currently. He has crackles on exam that likely are more attributed to his pneumonia and/or COPD. Would hold further diuresis, can discharge on p.o. Lasix 20 mg starting tomorrow. Not on any GDMT PTA.  Once renal function normalizes some, could consider starting SGLT2 inhibitor.  Further titration of GDMT can be done outpatient.  Previously on losartan however this was discontinued at some point, unclear reason. I will schedule him follow-up, need to repeat BMP in 1 to 2 weeks.  CKD Creatinine on admission 1.3.  Now 1.91.  BUN 31.  HAP Antibiotics per primary team, leukocytosis has resolved and afebrile.  Hyperlipidemia On statin and fenofibrate.   Risk Assessment/Risk Scores:   New York Heart Association (NYHA) Functional Class NYHA Class II        For questions or updates, please contact [facility] Please consult [email or web address] for contact info under    Signed, Abagail Kitchens, PA-C  08/03/2023 1:06 PM

## 2023-08-03 NOTE — Progress Notes (Signed)
Pharmacy Antibiotic Note  Harry Norris is a 87 y.o. male admitted on 07/31/2023 with pneumonia.  Pharmacy has been consulted for Cefepime dosing.  Renal function worsening, afebrile, WBC normalized.  Plan: Reduce cefepime to 2gm IV Q24H Monitor renal fxn, clinical progress F/u abx LOT   Height: 5\' 10"  (177.8 cm) Weight: 64.9 kg (143 lb 1.3 oz) IBW/kg (Calculated) : 73  Temp (24hrs), Avg:97.9 F (36.6 C), Min:97.5 F (36.4 C), Max:98.3 F (36.8 C)  Recent Labs  Lab 07/31/23 1739 07/31/23 1754 08/01/23 0826 08/02/23 0831 08/03/23 0652  WBC 14.1*  --  16.6* 10.2  --   CREATININE  --  1.30* 1.42* 1.55* 1.91*  LATICACIDVEN 1.1  --   --   --   --     Estimated Creatinine Clearance: 26 mL/min (A) (by C-G formula based on SCr of 1.91 mg/dL (H)).    Allergies  Allergen Reactions   Ace Inhibitors Cough    Cefepime 11/30 >>  Vanc 11/30 >> 12/1   11/30 BCx - NGTD 11/30 MRSA PCR: negative  Harry Norris D. Laney Potash, PharmD, BCPS, BCCCP 08/03/2023, 9:01 AM

## 2023-08-03 NOTE — Progress Notes (Signed)
Physical Therapy Treatment Patient Details Name: Harry Norris MRN: [id number] DOB: 07-Dec-1935 Today's Date: 08/03/2023   History of Present Illness 87 y.o. male presents to [facility] 07/31/23 w/ coughing spells. Admitted w/ PNA and fluid overload, negative R LE DVT study. Recent admit 11/13-11/15 w/ acute metabolic encephalopathy and diarrhea. PMHx: DM, COPD, chronic hypoxic respiratory failure on 2L [place] PRN, cardiomyopathy EF 35%, HLD, HTN, HLD, prostate cancer.    PT Comments  Pt received in recliner, requesting assist from PTA due to back/L hip pain and pt guarding significantly at L side due to pain in chair. Pt assisted to standing and agreeable to additional gait trial for pressure relief/for decreased stiffness in back and pt chair also adjusted with more pillows on seat surface to reduce pt discomfort. Pt needing up to CGA for safety with transfers and mostly Supervision for gait when using RW. Pt needing physical assist when not using rollator due to increased stability without assistive device. Pt performs longer household distance gait trial (~381ft) with single seated break. Pt continues to benefit from PT services to progress toward functional mobility goals. He would likely benefit from assisted living type facility in addition to HHPT services in order to ensure safety as his son reports he has some assist at home but not 24/7 assist, and pt with poor safety awareness and deconditioning and remains a high fall risk.    If plan is discharge home, recommend the following: A little help with walking and/or transfers;Help with stairs or ramp for entrance;Assist for transportation   Can travel by private vehicle     Yes  Equipment Recommendations  None recommended by PT (pt has rollator at home)    Recommendations for Other Services       Precautions / Restrictions Precautions Precautions: Fall Precaution Comments: incontinent B/B Restrictions Weight Bearing Restrictions: No      Mobility  Bed Mobility Overal bed mobility: Needs Assistance Bed Mobility: Supine to Sit     Supine to sit: Supervision, Used rails, HOB elevated     General bed mobility comments: pt received/remains in chair    Transfers Overall transfer level: Needs assistance Equipment used: Rollator (4 wheels) Transfers: Sit to/from Stand Sit to Stand: Supervision, Contact guard assist           General transfer comment: from chair and rollator surfaces with cues for safe UE placement and needs reminders for use of rollator brakes. CGA for stand>sit to rollator due to brakes not locking tightly and device tends to slide back while pt sitting.    Ambulation/Gait Ambulation/Gait assistance: Supervision Gait Distance (Feet): 350 Feet Assistive device: Rollator (4 wheels), None Gait Pattern/deviations: Step-through pattern, Wide base of support, Drifts right/left       General Gait Details: Supervision with rollator support, occasional drift in hallway but overall fairly stable, shorter stride length, no LOB/buckling, light to moderate BUE reliance on AD. Seated rest break after ~80ft so PTA can grab a blanket for his shoulders, pt perseverating on feeling cold.   Stairs Stairs: Yes Stairs assistance: Contact guard assist, Supervision Stair Management: One rail Left, Step to pattern, Forwards Number of Stairs: 5 General stair comments: cues for safety and sequencing with good carryover, pt heavily reliant on wall rail, no buckling and fair safety. CGA to Supervision for pt safety but not needing HHA.   Wheelchair Mobility     Tilt Bed    Modified Rankin (Stroke Patients Only)       Balance Overall balance  assessment: Mild deficits observed, not formally tested                                          Cognition Arousal: Alert Behavior During Therapy: WFL for tasks assessed/performed Overall Cognitive Status: Within Functional Limits for tasks  assessed                           Safety/Judgement: Decreased awareness of safety, Decreased awareness of deficits   Problem Solving: Requires verbal cues General Comments: Pt needs reminders to push from surfaces and to reach back prior to sitting, as well as for use of brakes on rollator before/after transfers. Decreased memory recall from earlier afternoon session to session this evening. Pt son arriving at end of session and reports his L hip/back pain has been chronic and that he has had memory issues for a while but pt is adamant about not going for short term rehab.        Exercises Other Exercises Other Exercises: seated BLE AROM: LAQ, ankle pumps x10 reps ea Other Exercises: STS x3 reps from recliner, cues for safe UE placement each rep    General Comments General comments (skin integrity, edema, etc.): no acute s/sx distress other than c/o feeling cold and chronic R hip and back pain. Pt reports chair more comfortable with cushions over the seat.      Pertinent Vitals/Pain Pain Assessment Pain Assessment: Faces Faces Pain Scale: Hurts little more Pain Location: L hip while sitting in chair (heavy R lean) Pain Descriptors / Indicators: Discomfort, Grimacing, Sore Pain Intervention(s): Limited activity within patient's tolerance, Monitored during session, Repositioned, Other (comment) (per son this is chronic pain for him)    Home Living                          Prior Function            PT Goals (current goals can now be found in the care plan section) Acute Rehab PT Goals Patient Stated Goal: to go home PT Goal Formulation: With patient Time For Goal Achievement: 08/15/23 Progress towards PT goals: Progressing toward goals    Frequency    Min 1X/week      PT Plan      Co-evaluation              AM-PAC PT "6 Clicks" Mobility   Outcome Measure  Help needed turning from your back to your side while in a flat bed without using  bedrails?: A Little Help needed moving from lying on your back to sitting on the side of a flat bed without using bedrails?: A Little Help needed moving to and from a bed to a chair (including a wheelchair)?: A Little Help needed standing up from a chair using your arms (e.g., wheelchair or bedside chair)?: A Little Help needed to walk in hospital room?: A Little Help needed climbing 3-5 steps with a railing? : A Little 6 Click Score: 18    End of Session Equipment Utilized During Treatment: Gait belt Activity Tolerance: Patient tolerated treatment well Patient left: in chair;with call bell/phone within reach;with chair alarm set;Other (comment);with family/visitor present (pt son arriving, encouraging him to take more bites of his dinner) Nurse Communication: Mobility status PT Visit Diagnosis: Unsteadiness on feet (R26.81);Muscle weakness (generalized) (M62.81);Difficulty in  walking, not elsewhere classified (R26.2)     Time: 4098-1191 PT Time Calculation (min) (ACUTE ONLY): 15 min  Charges:    $Gait Training: 8-22 mins PT General Charges $$ ACUTE PT VISIT: 1 Visit                     Nithin Demeo P., PTA Acute Rehabilitation Services Secure Chat Preferred 9a-5:30pm Office: [phone]    Dorathy Kinsman [facility] 08/03/2023, 6:15 PM

## 2023-08-05 ENCOUNTER — Other Ambulatory Visit: Payer: Self-pay

## 2023-08-05 ENCOUNTER — Emergency Department (HOSPITAL_COMMUNITY): Payer: Medicare Other

## 2023-08-05 ENCOUNTER — Inpatient Hospital Stay (HOSPITAL_COMMUNITY)
Admission: EM | Admit: 2023-08-05 | Discharge: 2023-08-12 | DRG: 189 | Disposition: A | Discharge status: Skilled Nursing Facility | Payer: Medicare Other | Attending: Internal Medicine | Admitting: Internal Medicine

## 2023-08-05 ENCOUNTER — Encounter (HOSPITAL_COMMUNITY): Payer: Self-pay

## 2023-08-05 DIAGNOSIS — N1832 Chronic kidney disease, stage 3b: Secondary | ICD-10-CM | POA: Diagnosis present

## 2023-08-05 DIAGNOSIS — L89326 Pressure-induced deep tissue damage of left buttock: Secondary | ICD-10-CM | POA: Diagnosis present

## 2023-08-05 DIAGNOSIS — I5022 Chronic systolic (congestive) heart failure: Secondary | ICD-10-CM | POA: Diagnosis not present

## 2023-08-05 DIAGNOSIS — J90 Pleural effusion, not elsewhere classified: Secondary | ICD-10-CM | POA: Diagnosis present

## 2023-08-05 DIAGNOSIS — R339 Retention of urine, unspecified: Secondary | ICD-10-CM | POA: Diagnosis present

## 2023-08-05 DIAGNOSIS — E119 Type 2 diabetes mellitus without complications: Secondary | ICD-10-CM | POA: Diagnosis not present

## 2023-08-05 DIAGNOSIS — Z7401 Bed confinement status: Secondary | ICD-10-CM | POA: Diagnosis not present

## 2023-08-05 DIAGNOSIS — J4489 Other specified chronic obstructive pulmonary disease: Secondary | ICD-10-CM | POA: Diagnosis not present

## 2023-08-05 DIAGNOSIS — Z87891 Personal history of nicotine dependence: Secondary | ICD-10-CM

## 2023-08-05 DIAGNOSIS — R0989 Other specified symptoms and signs involving the circulatory and respiratory systems: Secondary | ICD-10-CM | POA: Diagnosis not present

## 2023-08-05 DIAGNOSIS — E8809 Other disorders of plasma-protein metabolism, not elsewhere classified: Secondary | ICD-10-CM | POA: Diagnosis not present

## 2023-08-05 DIAGNOSIS — Z9981 Dependence on supplemental oxygen: Secondary | ICD-10-CM | POA: Diagnosis not present

## 2023-08-05 DIAGNOSIS — I5030 Unspecified diastolic (congestive) heart failure: Secondary | ICD-10-CM | POA: Diagnosis not present

## 2023-08-05 DIAGNOSIS — R7989 Other specified abnormal findings of blood chemistry: Secondary | ICD-10-CM

## 2023-08-05 DIAGNOSIS — K521 Toxic gastroenteritis and colitis: Secondary | ICD-10-CM | POA: Diagnosis not present

## 2023-08-05 DIAGNOSIS — J9811 Atelectasis: Secondary | ICD-10-CM | POA: Diagnosis not present

## 2023-08-05 DIAGNOSIS — G9341 Metabolic encephalopathy: Secondary | ICD-10-CM | POA: Diagnosis present

## 2023-08-05 DIAGNOSIS — Z833 Family history of diabetes mellitus: Secondary | ICD-10-CM

## 2023-08-05 DIAGNOSIS — Z66 Do not resuscitate: Secondary | ICD-10-CM | POA: Diagnosis not present

## 2023-08-05 DIAGNOSIS — K219 Gastro-esophageal reflux disease without esophagitis: Secondary | ICD-10-CM | POA: Diagnosis present

## 2023-08-05 DIAGNOSIS — E1151 Type 2 diabetes mellitus with diabetic peripheral angiopathy without gangrene: Secondary | ICD-10-CM | POA: Diagnosis present

## 2023-08-05 DIAGNOSIS — Z1152 Encounter for screening for COVID-19: Secondary | ICD-10-CM

## 2023-08-05 DIAGNOSIS — D631 Anemia in chronic kidney disease: Secondary | ICD-10-CM | POA: Diagnosis not present

## 2023-08-05 DIAGNOSIS — I2489 Other forms of acute ischemic heart disease: Secondary | ICD-10-CM | POA: Diagnosis not present

## 2023-08-05 DIAGNOSIS — E782 Mixed hyperlipidemia: Secondary | ICD-10-CM

## 2023-08-05 DIAGNOSIS — E871 Hypo-osmolality and hyponatremia: Secondary | ICD-10-CM | POA: Diagnosis not present

## 2023-08-05 DIAGNOSIS — J189 Pneumonia, unspecified organism: Secondary | ICD-10-CM | POA: Diagnosis present

## 2023-08-05 DIAGNOSIS — J9620 Acute and chronic respiratory failure, unspecified whether with hypoxia or hypercapnia: Secondary | ICD-10-CM | POA: Diagnosis not present

## 2023-08-05 DIAGNOSIS — I130 Hypertensive heart and chronic kidney disease with heart failure and stage 1 through stage 4 chronic kidney disease, or unspecified chronic kidney disease: Secondary | ICD-10-CM | POA: Diagnosis not present

## 2023-08-05 DIAGNOSIS — Z602 Problems related to living alone: Secondary | ICD-10-CM | POA: Diagnosis present

## 2023-08-05 DIAGNOSIS — I499 Cardiac arrhythmia, unspecified: Secondary | ICD-10-CM | POA: Diagnosis not present

## 2023-08-05 DIAGNOSIS — Y95 Nosocomial condition: Secondary | ICD-10-CM | POA: Diagnosis present

## 2023-08-05 DIAGNOSIS — R488 Other symbolic dysfunctions: Secondary | ICD-10-CM | POA: Diagnosis not present

## 2023-08-05 DIAGNOSIS — I429 Cardiomyopathy, unspecified: Secondary | ICD-10-CM | POA: Diagnosis not present

## 2023-08-05 DIAGNOSIS — R531 Weakness: Secondary | ICD-10-CM | POA: Diagnosis not present

## 2023-08-05 DIAGNOSIS — Z860100 Personal history of colon polyps, unspecified: Secondary | ICD-10-CM

## 2023-08-05 DIAGNOSIS — Z825 Family history of asthma and other chronic lower respiratory diseases: Secondary | ICD-10-CM

## 2023-08-05 DIAGNOSIS — J9621 Acute and chronic respiratory failure with hypoxia: Secondary | ICD-10-CM | POA: Diagnosis not present

## 2023-08-05 DIAGNOSIS — M549 Dorsalgia, unspecified: Secondary | ICD-10-CM | POA: Diagnosis not present

## 2023-08-05 DIAGNOSIS — R2681 Unsteadiness on feet: Secondary | ICD-10-CM | POA: Diagnosis not present

## 2023-08-05 DIAGNOSIS — Z8249 Family history of ischemic heart disease and other diseases of the circulatory system: Secondary | ICD-10-CM

## 2023-08-05 DIAGNOSIS — F0390 Unspecified dementia without behavioral disturbance: Secondary | ICD-10-CM | POA: Diagnosis present

## 2023-08-05 DIAGNOSIS — E1122 Type 2 diabetes mellitus with diabetic chronic kidney disease: Secondary | ICD-10-CM | POA: Diagnosis present

## 2023-08-05 DIAGNOSIS — J440 Chronic obstructive pulmonary disease with acute lower respiratory infection: Secondary | ICD-10-CM | POA: Diagnosis present

## 2023-08-05 DIAGNOSIS — R778 Other specified abnormalities of plasma proteins: Secondary | ICD-10-CM | POA: Diagnosis not present

## 2023-08-05 DIAGNOSIS — I5042 Chronic combined systolic (congestive) and diastolic (congestive) heart failure: Secondary | ICD-10-CM | POA: Diagnosis not present

## 2023-08-05 DIAGNOSIS — Z888 Allergy status to other drugs, medicaments and biological substances status: Secondary | ICD-10-CM

## 2023-08-05 DIAGNOSIS — Z87442 Personal history of urinary calculi: Secondary | ICD-10-CM

## 2023-08-05 DIAGNOSIS — E785 Hyperlipidemia, unspecified: Secondary | ICD-10-CM | POA: Diagnosis not present

## 2023-08-05 DIAGNOSIS — J690 Pneumonitis due to inhalation of food and vomit: Secondary | ICD-10-CM | POA: Diagnosis not present

## 2023-08-05 DIAGNOSIS — I214 Non-ST elevation (NSTEMI) myocardial infarction: Secondary | ICD-10-CM

## 2023-08-05 DIAGNOSIS — J439 Emphysema, unspecified: Secondary | ICD-10-CM | POA: Diagnosis not present

## 2023-08-05 DIAGNOSIS — Z48813 Encounter for surgical aftercare following surgery on the respiratory system: Secondary | ICD-10-CM | POA: Diagnosis not present

## 2023-08-05 DIAGNOSIS — L89312 Pressure ulcer of right buttock, stage 2: Secondary | ICD-10-CM | POA: Diagnosis not present

## 2023-08-05 DIAGNOSIS — R109 Unspecified abdominal pain: Secondary | ICD-10-CM | POA: Diagnosis not present

## 2023-08-05 DIAGNOSIS — G931 Anoxic brain damage, not elsewhere classified: Secondary | ICD-10-CM | POA: Diagnosis present

## 2023-08-05 DIAGNOSIS — I21A1 Myocardial infarction type 2: Secondary | ICD-10-CM | POA: Diagnosis not present

## 2023-08-05 DIAGNOSIS — J910 Malignant pleural effusion: Secondary | ICD-10-CM | POA: Diagnosis not present

## 2023-08-05 DIAGNOSIS — Z7984 Long term (current) use of oral hypoglycemic drugs: Secondary | ICD-10-CM

## 2023-08-05 DIAGNOSIS — Z743 Need for continuous supervision: Secondary | ICD-10-CM | POA: Diagnosis not present

## 2023-08-05 DIAGNOSIS — Z841 Family history of disorders of kidney and ureter: Secondary | ICD-10-CM

## 2023-08-05 DIAGNOSIS — Z79899 Other long term (current) drug therapy: Secondary | ICD-10-CM

## 2023-08-05 DIAGNOSIS — Z741 Need for assistance with personal care: Secondary | ICD-10-CM | POA: Diagnosis not present

## 2023-08-05 DIAGNOSIS — M6281 Muscle weakness (generalized): Secondary | ICD-10-CM | POA: Diagnosis not present

## 2023-08-05 DIAGNOSIS — Z8546 Personal history of malignant neoplasm of prostate: Secondary | ICD-10-CM

## 2023-08-05 DIAGNOSIS — J180 Bronchopneumonia, unspecified organism: Secondary | ICD-10-CM | POA: Diagnosis present

## 2023-08-05 DIAGNOSIS — I447 Left bundle-branch block, unspecified: Secondary | ICD-10-CM | POA: Diagnosis present

## 2023-08-05 DIAGNOSIS — J449 Chronic obstructive pulmonary disease, unspecified: Secondary | ICD-10-CM | POA: Diagnosis not present

## 2023-08-05 DIAGNOSIS — Z7409 Other reduced mobility: Secondary | ICD-10-CM | POA: Diagnosis not present

## 2023-08-05 DIAGNOSIS — Z7951 Long term (current) use of inhaled steroids: Secondary | ICD-10-CM

## 2023-08-05 DIAGNOSIS — I5040 Unspecified combined systolic (congestive) and diastolic (congestive) heart failure: Secondary | ICD-10-CM | POA: Diagnosis not present

## 2023-08-05 DIAGNOSIS — I517 Cardiomegaly: Secondary | ICD-10-CM | POA: Diagnosis not present

## 2023-08-05 DIAGNOSIS — I700 Atherosclerosis of aorta: Secondary | ICD-10-CM | POA: Diagnosis not present

## 2023-08-05 DIAGNOSIS — J9601 Acute respiratory failure with hypoxia: Secondary | ICD-10-CM | POA: Diagnosis not present

## 2023-08-05 DIAGNOSIS — I872 Venous insufficiency (chronic) (peripheral): Secondary | ICD-10-CM | POA: Diagnosis present

## 2023-08-05 DIAGNOSIS — T3695XA Adverse effect of unspecified systemic antibiotic, initial encounter: Secondary | ICD-10-CM | POA: Diagnosis not present

## 2023-08-05 DIAGNOSIS — R6889 Other general symptoms and signs: Secondary | ICD-10-CM | POA: Diagnosis not present

## 2023-08-05 DIAGNOSIS — R918 Other nonspecific abnormal finding of lung field: Secondary | ICD-10-CM | POA: Diagnosis not present

## 2023-08-05 LAB — BLOOD GAS, ARTERIAL
Acid-Base Excess: 6.6 mmol/L — ABNORMAL HIGH (ref 0.0–2.0)
Bicarbonate: 31.3 mmol/L — ABNORMAL HIGH (ref 20.0–28.0)
Drawn by: 225631
O2 Content: 2 L/min
O2 Saturation: 92.1 %
Patient temperature: 36.4
RATE: 18 {breaths}/min
pCO2 arterial: 43 mm[Hg] (ref 32–48)
pH, Arterial: 7.47 — ABNORMAL HIGH (ref 7.35–7.45)
pO2, Arterial: 56 mm[Hg] — ABNORMAL LOW (ref 83–108)

## 2023-08-05 LAB — CBC WITH DIFFERENTIAL/PLATELET
Abs Immature Granulocytes: 0.06 10*3/uL (ref 0.00–0.07)
Basophils Absolute: 0 10*3/uL (ref 0.0–0.1)
Basophils Relative: 0 %
Eosinophils Absolute: 0.1 10*3/uL (ref 0.0–0.5)
Eosinophils Relative: 2 %
HCT: 29.1 % — ABNORMAL LOW (ref 39.0–52.0)
Hemoglobin: 9.3 g/dL — ABNORMAL LOW (ref 13.0–17.0)
Immature Granulocytes: 1 %
Lymphocytes Relative: 26 %
Lymphs Abs: 1.4 10*3/uL (ref 0.7–4.0)
MCH: 29.2 pg (ref 26.0–34.0)
MCHC: 32 g/dL (ref 30.0–36.0)
MCV: 91.2 fL (ref 80.0–100.0)
Monocytes Absolute: 0.4 10*3/uL (ref 0.1–1.0)
Monocytes Relative: 7 %
Neutro Abs: 3.5 10*3/uL (ref 1.7–7.7)
Neutrophils Relative %: 64 %
Platelets: 393 10*3/uL (ref 150–400)
RBC: 3.19 MIL/uL — ABNORMAL LOW (ref 4.22–5.81)
RDW: 16.9 % — ABNORMAL HIGH (ref 11.5–15.5)
WBC: 5.5 10*3/uL (ref 4.0–10.5)
nRBC: 0 % (ref 0.0–0.2)

## 2023-08-05 LAB — RESP PANEL BY RT-PCR (RSV, FLU A&B, COVID)  RVPGX2
Influenza A by PCR: NEGATIVE
Influenza B by PCR: NEGATIVE
Resp Syncytial Virus by PCR: NEGATIVE
SARS Coronavirus 2 by RT PCR: NEGATIVE

## 2023-08-05 LAB — HEPARIN LEVEL (UNFRACTIONATED): Heparin Unfractionated: <0.1 [IU]/mL — ABNORMAL LOW (ref 0.30–0.70)

## 2023-08-05 LAB — COMPREHENSIVE METABOLIC PANEL
ALT: 18 U/L (ref 0–44)
AST: 22 U/L (ref 15–41)
Albumin: 2.6 g/dL — ABNORMAL LOW (ref 3.5–5.0)
Alkaline Phosphatase: 44 U/L (ref 38–126)
Anion gap: 8 (ref 5–15)
BUN: 40 mg/dL — ABNORMAL HIGH (ref 8–23)
CO2: 30 mmol/L (ref 22–32)
Calcium: 8.8 mg/dL — ABNORMAL LOW (ref 8.9–10.3)
Chloride: 97 mmol/L — ABNORMAL LOW (ref 98–111)
Creatinine, Ser: 1.36 mg/dL — ABNORMAL HIGH (ref 0.61–1.24)
GFR, Estimated: 51 mL/min — ABNORMAL LOW (ref >60)
Glucose, Bld: 105 mg/dL — ABNORMAL HIGH (ref 70–99)
Potassium: 3.8 mmol/L (ref 3.5–5.1)
Sodium: 135 mmol/L (ref 135–145)
Total Bilirubin: 0.8 mg/dL (ref <1.2)
Total Protein: 6.4 g/dL — ABNORMAL LOW (ref 6.5–8.1)

## 2023-08-05 LAB — BRAIN NATRIURETIC PEPTIDE: B Natriuretic Peptide: 860.9 pg/mL — ABNORMAL HIGH (ref 0.0–100.0)

## 2023-08-05 LAB — PROTIME-INR
INR: 1.1 (ref 0.8–1.2)
Prothrombin Time: 14.4 s (ref 11.4–15.2)

## 2023-08-05 LAB — LACTIC ACID, PLASMA
Lactic Acid, Venous: 1.1 mmol/L (ref 0.5–1.9)
Lactic Acid, Venous: 0.9 mmol/L (ref 0.5–1.9)

## 2023-08-05 LAB — TROPONIN I (HIGH SENSITIVITY)
Troponin I (High Sensitivity): 1081 ng/L (ref <18)
Troponin I (High Sensitivity): 1018 ng/L (ref <18)
Troponin I (High Sensitivity): 794 ng/L (ref <18)
Troponin I (High Sensitivity): 798 ng/L (ref <18)

## 2023-08-05 LAB — MRSA NEXT GEN BY PCR, NASAL: MRSA by PCR Next Gen: NOT DETECTED

## 2023-08-05 LAB — APTT: aPTT: 34 s (ref 24–36)

## 2023-08-05 LAB — MAGNESIUM: Magnesium: 1.9 mg/dL (ref 1.7–2.4)

## 2023-08-05 MED ORDER — HEPARIN BOLUS VIA INFUSION
2000.0000 [IU] | Freq: Once | INTRAVENOUS | Status: AC
  Administered 2023-08-05: 2000 [IU] via INTRAVENOUS
  Filled 2023-08-05: qty 2000

## 2023-08-05 MED ORDER — ALBUTEROL SULFATE (2.5 MG/3ML) 0.083% IN NEBU: 2.5000 mg | INHALATION_SOLUTION | RESPIRATORY_TRACT | Status: DC | PRN

## 2023-08-05 MED ORDER — SODIUM CHLORIDE 0.9 % IV BOLUS: 1000.0000 mL | Freq: Once | INTRAVENOUS | Status: DC

## 2023-08-05 MED ORDER — ASPIRIN 81 MG PO TBEC
81.0000 mg | DELAYED_RELEASE_TABLET | Freq: Every day | ORAL | Status: DC
  Administered 2023-08-05 – 2023-08-12 (×8): 81 mg via ORAL
  Filled 2023-08-05 (×8): qty 1

## 2023-08-05 MED ORDER — ENSURE ENLIVE PO LIQD
237.0000 mL | Freq: Two times a day (BID) | ORAL | Status: DC
  Administered 2023-08-06 – 2023-08-12 (×8): 237 mL via ORAL

## 2023-08-05 MED ORDER — PANTOPRAZOLE SODIUM 40 MG PO TBEC
40.0000 mg | DELAYED_RELEASE_TABLET | Freq: Two times a day (BID) | ORAL | Status: DC
  Administered 2023-08-05 – 2023-08-12 (×15): 40 mg via ORAL
  Filled 2023-08-05 (×15): qty 1

## 2023-08-05 MED ORDER — SODIUM CHLORIDE 0.9 % IV SOLN
500.0000 mg | INTRAVENOUS | Status: AC
  Administered 2023-08-05 – 2023-08-07 (×3): 500 mg via INTRAVENOUS
  Filled 2023-08-05 (×3): qty 5

## 2023-08-05 MED ORDER — SODIUM CHLORIDE (PF) 0.9 % IJ SOLN
INTRAMUSCULAR | Status: AC
  Filled 2023-08-05: qty 50

## 2023-08-05 MED ORDER — ACETAMINOPHEN 325 MG PO TABS
650.0000 mg | ORAL_TABLET | Freq: Once | ORAL | Status: AC
  Administered 2023-08-05: 650 mg via ORAL
  Filled 2023-08-05: qty 2

## 2023-08-05 MED ORDER — ONDANSETRON HCL 4 MG PO TABS
4.0000 mg | ORAL_TABLET | Freq: Four times a day (QID) | ORAL | Status: DC | PRN
  Administered 2023-08-11: 4 mg via ORAL
  Filled 2023-08-05: qty 1

## 2023-08-05 MED ORDER — SODIUM CHLORIDE 0.9 % IV SOLN
2.0000 g | INTRAVENOUS | Status: AC
  Administered 2023-08-05 – 2023-08-07 (×3): 2 g via INTRAVENOUS
  Filled 2023-08-05 (×3): qty 20

## 2023-08-05 MED ORDER — ASPIRIN 81 MG PO CHEW
324.0000 mg | CHEWABLE_TABLET | Freq: Once | ORAL | Status: AC
  Administered 2023-08-05: 324 mg via ORAL
  Filled 2023-08-05: qty 4

## 2023-08-05 MED ORDER — HEPARIN BOLUS VIA INFUSION
3500.0000 [IU] | Freq: Once | INTRAVENOUS | Status: AC
  Administered 2023-08-05: 3500 [IU] via INTRAVENOUS
  Filled 2023-08-05: qty 3500

## 2023-08-05 MED ORDER — IOHEXOL 350 MG/ML SOLN
75.0000 mL | Freq: Once | INTRAVENOUS | Status: AC | PRN
  Administered 2023-08-05: 75 mL via INTRAVENOUS

## 2023-08-05 MED ORDER — ACETAMINOPHEN 650 MG RE SUPP: 650.0000 mg | Freq: Four times a day (QID) | RECTAL | Status: DC | PRN

## 2023-08-05 MED ORDER — ACETAMINOPHEN 325 MG PO TABS
650.0000 mg | ORAL_TABLET | Freq: Four times a day (QID) | ORAL | Status: DC | PRN
  Administered 2023-08-05 – 2023-08-11 (×10): 650 mg via ORAL
  Filled 2023-08-05 (×10): qty 2

## 2023-08-05 MED ORDER — SODIUM CHLORIDE 0.9 % IV BOLUS
500.0000 mL | Freq: Once | INTRAVENOUS | Status: AC
  Administered 2023-08-05: 500 mL via INTRAVENOUS

## 2023-08-05 MED ORDER — ATORVASTATIN CALCIUM 40 MG PO TABS
40.0000 mg | ORAL_TABLET | Freq: Every evening | ORAL | Status: DC
  Administered 2023-08-05 – 2023-08-11 (×7): 40 mg via ORAL
  Filled 2023-08-05 (×7): qty 1

## 2023-08-05 MED ORDER — FENOFIBRATE 160 MG PO TABS
160.0000 mg | ORAL_TABLET | Freq: Every day | ORAL | Status: DC
  Administered 2023-08-05 – 2023-08-12 (×8): 160 mg via ORAL
  Filled 2023-08-05 (×8): qty 1

## 2023-08-05 MED ORDER — SODIUM CHLORIDE 0.9 % IV SOLN
3.0000 g | Freq: Once | INTRAVENOUS | Status: AC
  Administered 2023-08-05: 3 g via INTRAVENOUS
  Filled 2023-08-05: qty 8

## 2023-08-05 MED ORDER — CHLORHEXIDINE GLUCONATE CLOTH 2 % EX PADS
6.0000 | MEDICATED_PAD | Freq: Every day | CUTANEOUS | Status: DC
  Administered 2023-08-05 – 2023-08-07 (×2): 6 via TOPICAL

## 2023-08-05 MED ORDER — ONDANSETRON HCL 4 MG/2ML IJ SOLN
4.0000 mg | Freq: Four times a day (QID) | INTRAMUSCULAR | Status: DC | PRN
  Administered 2023-08-09 (×2): 4 mg via INTRAVENOUS
  Filled 2023-08-05 (×2): qty 2

## 2023-08-05 MED ORDER — FUROSEMIDE 20 MG PO TABS
20.0000 mg | ORAL_TABLET | Freq: Every day | ORAL | Status: DC
  Administered 2023-08-05 – 2023-08-12 (×8): 20 mg via ORAL
  Filled 2023-08-05 (×8): qty 1

## 2023-08-05 MED ORDER — HEPARIN (PORCINE) 25000 UT/250ML-% IV SOLN
800.0000 [IU]/h | INTRAVENOUS | Status: DC
  Administered 2023-08-05: 800 [IU]/h via INTRAVENOUS
  Filled 2023-08-05: qty 250

## 2023-08-05 MED ORDER — HEPARIN (PORCINE) 25000 UT/250ML-% IV SOLN
1300.0000 [IU]/h | INTRAVENOUS | Status: DC
  Administered 2023-08-05: 1000 [IU]/h via INTRAVENOUS
  Administered 2023-08-06: 1150 [IU]/h via INTRAVENOUS
  Administered 2023-08-07: 1300 [IU]/h via INTRAVENOUS
  Filled 2023-08-05 (×2): qty 250

## 2023-08-05 NOTE — Plan of Care (Signed)
  Problem: Education: Goal: Knowledge of General Education information will improve Description: Including pain rating scale, medication(s)/side effects and non-pharmacologic comfort measures Outcome: Not Progressing   Problem: Health Behavior/Discharge Planning: Goal: Ability to manage health-related needs will improve Outcome: Not Progressing   Problem: Clinical Measurements: Goal: Ability to maintain clinical measurements within normal limits will improve Outcome: Not Progressing Goal: Will remain free from infection Outcome: Not Progressing Goal: Diagnostic test results will improve Outcome: Not Progressing Goal: Respiratory complications will improve Outcome: Not Progressing Goal: Cardiovascular complication will be avoided Outcome: Not Progressing   Problem: Clinical Measurements: Goal: Will remain free from infection Outcome: Not Progressing

## 2023-08-05 NOTE — ED Notes (Signed)
Pt son [name] present at bedside with his s/o at this time. Update provided and all questions, concerns addressed at this time. Dr. Manus Gunning updated that pt family at bedside.

## 2023-08-05 NOTE — ED Triage Notes (Signed)
Pt arrives via [facility] from home for worsening shortness of breath, pt was reportedly just d/c from [facility] 12/3 with diagnosis of pneumonia, and pt exhibits productive cough. Per EMS pt SPO2 was 78% with pt on home 2L/min [place] and that pt increased to 4L/min [place] and pt SPO2 reading 92%. Pt answers all orientation questions appropriately, however, pt requires frequent reorientation per EMS several times while en route. Pt nailbeds are pale and cold to the touch. Pt reports he lives alone, and someone comes in 3x a day to check on him.

## 2023-08-05 NOTE — ED Provider Notes (Signed)
[place] EMERGENCY DEPARTMENT AT [facility] Provider Note   CSN: [id number] Arrival date & time: 08/05/23  4098     History  Chief Complaint  Patient presents with   Shortness of Breath   Altered Mental Status    Harry Norris is a 87 y.o. male.   87 year old male with history of COPD, diabetes, hypertension, hyperlipidemia, chronic hypoxic respiratory failure on 2 L of oxygen, chronic combined systolic/diastolic CHF, CKD stage IIIb who presented with coughing spells, dyspnea, fever.  Discharge from [facility] 2 days ago with pneumonia on Augmentin.  He apparently called EMS tonight for increased shortness of breath does not recall doing this. He does not know why he is in the hospital now.  He apparently called EMS himself for worsening shortness of breath in the setting of COPD and recent pneumonia diagnosis.  EMS reports his initial O2 saturation was 78% on his home 2 L and he was cyanotic.  They increased him to 4 L and he is now reading in the mid 90s.  He is confused and disoriented on arrival.  Denies chest pain, denies fever, denies shortness of breath denies abdominal pain, nausea or vomiting.  Does not recall calling EMS.  Does not think he is short of breath now.  He seems to be confused.  No family available.  After family arrival, they state patient called EMS because of "pain everywhere" and believed he was still in the hospital. It is not unusual for him to be confused when sick, but is normally lucid.   The history is provided by the patient.  Shortness of Breath Altered Mental Status      Home Medications Prior to Admission medications   Medication Sig Start Date End Date Taking? Authorizing Provider  acetaminophen (TYLENOL) 325 MG tablet Take 2 tablets (650 mg total) by mouth every 6 (six) hours as needed for mild pain (pain score 1-3) (or Fever >/= 101). 07/16/23   Loyce Dys, MD  albuterol (VENTOLIN HFA) 108 (90 Base) MCG/ACT inhaler  Inhale 2 puffs into the lungs every 6 (six) hours as needed for wheezing or shortness of breath.    [provider]  amoxicillin-clavulanate (AUGMENTIN) 500-125 MG tablet Take 1 tablet by mouth 2 (two) times daily for 4 days. 08/03/23 08/07/23  Burnadette Pop, MD  ascorbic acid (VITAMIN C) 500 MG tablet Take 500 mg by mouth daily.    [provider]  aspirin EC 81 MG tablet Take 81 mg by mouth in the morning.    [provider]  atorvastatin (LIPITOR) 40 MG tablet Take 40 mg by mouth every evening.    [provider]  fenofibrate 160 MG tablet Take 160 mg by mouth in the morning.    [provider]  FLOVENT HFA 110 MCG/ACT inhaler Inhale 2 puffs into the lungs 2 (two) times daily as needed (Shortness of breath). 09/09/21   [provider]  furosemide (LASIX) 20 MG tablet Take 1 tablet (20 mg total) by mouth daily. 08/04/23   Burnadette Pop, MD  guaiFENesin (MUCINEX) 600 MG 12 hr tablet Take 1 tablet (600 mg total) by mouth 2 (two) times daily for 5 days. 08/03/23 08/08/23  Burnadette Pop, MD  hydrOXYzine (ATARAX) 25 MG tablet Take 1 tablet (25 mg total) by mouth every 8 (eight) hours as needed. 08/03/23   Burnadette Pop, MD  ipratropium-albuterol (DUONEB) 0.5-2.5 (3) MG/3ML SOLN Take 3 mLs by nebulization in the morning and at bedtime. 06/30/23  [provider]  melatonin 5 MG TABS Take 5-19 mg by mouth at bedtime.    [provider]  Multiple Vitamins-Minerals (CENTRUM SILVER 50+MEN) TABS Take 1 tablet by mouth in the morning.    [provider]  Omega-3 Fatty Acids (FISH OIL) 1000 MG CAPS Take 1,000 mg by mouth in the morning and at bedtime.    [provider]  OXYGEN Inhale 2.5-3 L/min into the lungs as needed (for shortness of breath).    [provider]  pantoprazole (PROTONIX) 40 MG tablet Take 40 mg by mouth 2 (two) times daily. 04/15/23   [provider]  vitamin E 180 MG (400 UNITS)  capsule Take 400 Units by mouth daily.    [provider]      Allergies    Ace inhibitors    Review of Systems   Review of Systems  Unable to perform ROS: Mental status change  Respiratory:  Positive for shortness of breath.     Physical Exam Updated Vital Signs BP 105/67   Pulse 85   Temp (!) 97.5 F (36.4 C)   Resp 19   Ht 5\' 10"  (1.778 m)   SpO2 94%   BMI 20.53 kg/m  Physical Exam Vitals and nursing note reviewed.  Constitutional:      General: He is in acute distress.     Appearance: He is well-developed. He is ill-appearing.     Comments: Chronic ill-appearing, frail  HENT:     Head: Normocephalic and atraumatic.     Mouth/Throat:     Mouth: Mucous membranes are dry.     Pharynx: No oropharyngeal exudate.  Eyes:     Conjunctiva/sclera: Conjunctivae normal.     Pupils: Pupils are equal, round, and reactive to light.  Neck:     Comments: No meningismus. Cardiovascular:     Rate and Rhythm: Normal rate and regular rhythm.     Heart sounds: Normal heart sounds. No murmur heard. Pulmonary:     Effort: Pulmonary effort is normal. No respiratory distress.     Breath sounds: Rhonchi present.     Comments: Scattered rhonchi Chest:     Chest wall: No tenderness.  Abdominal:     Palpations: Abdomen is soft.     Tenderness: There is no abdominal tenderness. There is no guarding or rebound.  Musculoskeletal:        General: No tenderness. Normal range of motion.     Cervical back: Normal range of motion and neck supple.     Right lower leg: Edema present.     Left lower leg: Edema present.  Skin:    General: Skin is warm.  Neurological:     Mental Status: He is alert.     Cranial Nerves: No cranial nerve deficit.     Motor: No abnormal muscle tone.     Coordination: Coordination normal.     Comments: Oriented to person only.  Moves all extremities equally.  Slow to respond.  Psychiatric:        Behavior: Behavior normal.     ED Results /  Procedures / Treatments   Labs (all labs ordered are listed, but only abnormal results are displayed) Labs Reviewed  CBC WITH DIFFERENTIAL/PLATELET - Abnormal; Notable for the following components:      Result Value   RBC 3.19 (*)    Hemoglobin 9.3 (*)    HCT 29.1 (*)    RDW 16.9 (*)    All other components within  normal limits  COMPREHENSIVE METABOLIC PANEL - Abnormal; Notable for the following components:   Chloride 97 (*)    Glucose, Bld 105 (*)    BUN 40 (*)    Creatinine, Ser 1.36 (*)    Calcium 8.8 (*)    Total Protein 6.4 (*)    Albumin 2.6 (*)    GFR, Estimated 51 (*)    All other components within normal limits  BLOOD GAS, ARTERIAL - Abnormal; Notable for the following components:   pH, Arterial 7.47 (*)    pO2, Arterial 56 (*)    Bicarbonate 31.3 (*)    Acid-Base Excess 6.6 (*)    All other components within normal limits  BRAIN NATRIURETIC PEPTIDE - Abnormal; Notable for the following components:   B Natriuretic Peptide 860.9 (*)    All other components within normal limits  TROPONIN I (HIGH SENSITIVITY) - Abnormal; Notable for the following components:   Troponin I (High Sensitivity) 1,081 (*)    All other components within normal limits  TROPONIN I (HIGH SENSITIVITY) - Abnormal; Notable for the following components:   Troponin I (High Sensitivity) 1,018 (*)    All other components within normal limits  RESP PANEL BY RT-PCR (RSV, FLU A&B, COVID)  RVPGX2  CULTURE, BLOOD (ROUTINE X 2)  CULTURE, BLOOD (ROUTINE X 2)  LACTIC ACID, PLASMA  LACTIC ACID, PLASMA  MAGNESIUM  PROTIME-INR  APTT  HEPARIN LEVEL (UNFRACTIONATED)  TROPONIN I (HIGH SENSITIVITY)    EKG EKG Interpretation Date/Time:  Thursday August 05 2023 04:25:14 EST Ventricular Rate:  74 PR Interval:  169 QRS Duration:  155 QT Interval:  448 QTC Calculation: 498 R Axis:   232  Text Interpretation: Sinus arrhythmia Probable left atrial enlargement Nonspecific intraventricular conduction delay  No old tracing to compare Confirmed by Glynn Octave [phone]) on 08/05/2023 4:28:41 AM  Radiology CT Angio Chest PE W and/or Wo Contrast  Result Date: 08/05/2023 CLINICAL DATA:  87 year old male with altered mental status, delirium, recently hospitalized for pneumonia. EXAM: CT ANGIOGRAPHY CHEST WITH CONTRAST TECHNIQUE: Multidetector CT imaging of the chest was performed using the standard protocol during bolus administration of intravenous contrast. Multiplanar CT image reconstructions and MIPs were obtained to evaluate the vascular anatomy. RADIATION DOSE REDUCTION: This exam was performed according to the departmental dose-optimization program which includes automated exposure control, adjustment of the mA and/or kV according to patient size and/or use of iterative reconstruction technique. CONTRAST:  75mL OMNIPAQUE IOHEXOL 350 MG/ML SOLN COMPARISON:  Portable chest this morning.  CTA chest 07/31/2022. FINDINGS: Cardiovascular: Excellent contrast bolus timing in the pulmonary arterial tree. Mild respiratory motion. No pulmonary artery filling defect. Calcified aortic atherosclerosis. Calcified coronary artery atherosclerosis. Stable heart size, mild cardiomegaly. No pericardial effusion. Little contrast in the aorta. Mediastinum/Nodes: Stable, negative. Lungs/Pleura: Lower lung volumes. Atelectatic changes to the major airways which remain patent. Layering pleural effusions, up to moderate on the right and small on the left. Superimposed bilateral lower lobe atelectasis versus consolidation (some enhancement of the affected parenchyma on the right). Subtle superimposed sub solid peribronchial nodular opacity in the anterior basal segment of the right lower lobe and throughout the right middle lobe (series 12, image 86). No other convincing pulmonary inflammation. Some underlying chronic lung changes. Upper Abdomen: Negative visible liver, gallbladder, spleen, pancreas, adrenal glands and stomach.  Diverticulosis of the visible colon. Partially visible renal multi cystic disease on the right. Musculoskeletal: Widespread thoracic spinal ankylosis appears related to Diffuse idiopathic skeletal hyperostosis (DISH). No acute or suspicious  osseous lesion identified. Review of the MIP images confirms the above findings. IMPRESSION: 1. Negative for acute pulmonary embolus. 2. Lower lung volumes with bilateral up to moderate right and small left layering pleural effusions. Bilateral lower lobe atelectasis versus Pneumonia. And there is evidence of mild Bronchopneumonia in the right middle lobe. 3.  Aortic Atherosclerosis (ICD10-I70.0). Electronically Signed   By: Odessa Fleming M.D.   On: 08/05/2023 06:36   CT Head Wo Contrast  Result Date: 08/05/2023 CLINICAL DATA:  87 year old male with altered mental status, delirium, recently hospitalized for pneumonia. EXAM: CT HEAD WITHOUT CONTRAST TECHNIQUE: Contiguous axial images were obtained from the base of the skull through the vertex without intravenous contrast. RADIATION DOSE REDUCTION: This exam was performed according to the departmental dose-optimization program which includes automated exposure control, adjustment of the mA and/or kV according to patient size and/or use of iterative reconstruction technique. COMPARISON:  Head CT 07/14/2023. FINDINGS: Brain: Stable cerebral volume. Cavum septum pellucidum, normal variant. Chronic lacunar infarct right caudate. No midline shift, ventriculomegaly, mass effect, evidence of mass lesion, intracranial hemorrhage or evidence of cortically based acute infarction. Outside of the right caudate gray-white differentiation appears normal for age. Vascular: No suspicious intracranial vascular hyperdensity. Calcified atherosclerosis at the skull base. Skull: Intact.  No acute osseous abnormality identified. Sinuses/Orbits: Unresolved right middle ear and right greater than left mastoid opacification. Negative visible nasopharynx.  Left tympanic cavity remains pneumatized. But these findings are chronic and not progressed from January of 2023. Minimal paranasal sinus mucosal thickening, stable to improved. Other: No acute orbit or scalp soft tissue finding. IMPRESSION: 1. No acute intracranial abnormality. 2. Chronic lacunar infarct right caudate. 3. Chronic likely Postinflammatory opacification of the right middle ear, mastoids. Electronically Signed   By: Odessa Fleming M.D.   On: 08/05/2023 06:31   DG Chest Portable 1 View  Result Date: 08/05/2023 CLINICAL DATA:  87 year old male with increasing shortness of breath. Recent pneumonia. EXAM: PORTABLE CHEST 1 VIEW COMPARISON:  Portable chest 07/31/2023 and earlier. FINDINGS: Portable AP semi upright view at 0327 hours. Stable lung volumes and mediastinal contours. Calcified aortic atherosclerosis. Increased veiling opacity at both lung bases now, with less pronounced but unresolved confluent right lung base peribronchial opacity since 07/31/2023. No pneumothorax. No pulmonary edema. Stable visualized osseous structures. IMPRESSION: Evidence of new or increased bilateral pleural effusions superimposed on regressed but not resolved right lung base pneumonia. PA and lateral views of the chest may be valuable when feasible. Electronically Signed   By: Odessa Fleming M.D.   On: 08/05/2023 04:41    Procedures .Critical Care  Performed by: Glynn Octave, MD Authorized by: Glynn Octave, MD   Critical care provider statement:    Critical care time (minutes):  60   Critical care time was exclusive of:  Separately billable procedures and treating other patients   Critical care was necessary to treat or prevent imminent or life-threatening deterioration of the following conditions:  Respiratory failure (ACS)   Critical care was time spent personally by me on the following activities:  Development of treatment plan with patient or surrogate, discussions with consultants, evaluation of patient's  response to treatment, examination of patient, ordering and review of laboratory studies, ordering and review of radiographic studies, ordering and performing treatments and interventions, pulse oximetry, re-evaluation of patient's condition, review of old charts, blood draw for specimens and obtaining history from patient or surrogate   I assumed direction of critical care for this patient from another provider in my specialty:  no     Care discussed with: admitting provider       Medications Ordered in ED Medications - No data to display  ED Course/ Medical Decision Making/ A&P                                 Medical Decision Making Amount and/or Complexity of Data Reviewed Independent Historian: EMS Labs: ordered. Decision-making details documented in ED Course. Radiology: ordered and independent interpretation performed. Decision-making details documented in ED Course. ECG/medicine tests: ordered and independent interpretation performed. Decision-making details documented in ED Course.  Risk OTC drugs. Prescription drug management. Decision regarding hospitalization.   Altered mental status, somnolence, recent admission for pneumonia in the setting of COPD.  Hypoxic for EMS on home oxygen which is now improved on nasal cannula.  Patient denies chest pain.  EKG shows left bundle branch block similar to previous.  He is not hypoxic on his home oxygen.  Basic labs will be obtained.  Chest x-ray shows improvement of right basilar pneumonia with some persistent airspace disease and pleural effusions  ABG shows no significant CO2 retention.  Troponin 1000.  He denies any chest pain.  EKG shows unchanged left bundle branch block.  Consider ACS, consider PE. No STEMI, unchanged LBBB. Cr improved from previous and can tolerate CTA.  CT head negative for acute finding.  CTA negative for PE but dose show pleural effusions and airspace disease.   Continue IV antibiotics.  With  negative CT, empiric heparin gtt for concern for possible ACS and elevated troponin.   Respiratory status stable on nasal cannula.  Admission d/w Dr. Julian Reil.        Final Clinical Impression(s) / ED Diagnoses Final diagnoses:  Aspiration pneumonia of both lungs, unspecified aspiration pneumonia type, unspecified part of lung (HCC)  Elevated troponin    Rx / DC Orders ED Discharge Orders     None         Mikela Senn, Jeannett Senior, MD 08/05/23 [phone]

## 2023-08-05 NOTE — H&P (Signed)
History and Physical  Harry Norris WJX:[id number] DOB: July 19, 1936 DOA: 08/05/2023  PCP: Paulina Fusi, MD   Chief Complaint: Back pain  HPI: Harry Norris is a 87 y.o. male with medical history significant for COPD on as needed oxygen, diabetes, hypertension, hyperlipidemia, chronic combined systolic/diastolic CHF, CKD stage IIIb recently discharged from [facility] on 12/3 after stay for community-acquired pneumonia and acute renal failure who is now being admitted to the hospital with NSTEMI.  He was discharged home on 12/3, his son and daughter-in-law take close care of him, but he lives on his own.  It seems that since leaving the hospital he was doing decently okay, as per his usual after staying in the hospital, he was little bit weak, did not have much of an appetite but with otherwise doing well.  Yesterday he was complaining of some pain, each time he was asked, it seems that he complained of lower abdominal pain, low back pain, each time it was a different answer.  Family at the bedside as well as the patient are pretty clear that he never had any chest pain.  In the early morning hours today, apparently he called EMS, was documented as possibly being for shortness of breath, however patient and family deny any recent complaints of shortness of breath.  Otherwise, patient is not able to tell me why he called EMS.  Regardless he was found to be quite hypoxic on their arrival saturating 78% on his home 2 L nasal cannula (per his son he wears this as needed).  He was placed on 4 L nasal cannula by EMS, increased to 92% O2 saturation.  In the emergency department, workup is relatively benign, however he is started on IV heparin drip due to troponin of 1000.  Review of Systems: Please see HPI for pertinent positives and negatives. A complete 10 system review of systems are otherwise negative.  Past Medical History:  Diagnosis Date   AAA (abdominal aortic aneurysm) (HCC)    Acute hypoxemic  respiratory failure (HCC) 07/12/2021   Anemia    Asthma 02/07/2018   Benign neoplasm of colon    Blood in the stool    Bradycardia 12/19/2018   Bronchitis    Cancer (HCC)    prostate   Cardiomyopathy (HCC) 12/18/2015   Carotid artery occlusion    Chronic obstructive pulmonary disease with acute exacerbation (HCC) 07/12/2021   Chronic rhinitis 02/26/2016   Chronic venous insufficiency 12/18/2015   CKD (chronic kidney disease), stage III (HCC) 07/12/2021   Community acquired pneumonia of right lower lobe of lung 07/12/2021   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   PFTs 01/18/14:  FeV1 1.58 45% FVC 2.0 50% FeV1/FVC 79% Fef 25-75 795 ( 40% improved after BD) CXR: Copd changes ONO 03/07/14 desats to <88% on RA    Diarrhea    Diverticulitis    DM type 2 (diabetes mellitus, type 2) (HCC)    patient denies every being diagnosed with diabetes   Fatigue 12/12/2018   Hyperlipidemia    Hypertension    Hypertensive heart disease    Hypoxemia 04/10/2014   Overview:  Last Assessment & Plan:  Cont nocturnal oxygen therapy 2L    Influenza 07/29/2021   Kidney stone    LBBB (left bundle branch block) 12/18/2015   Leg swelling 11/28/2013   Orthostatic hypotension 12/19/2018   Other symptoms involving cardiovascular system 11/28/2013   Pressure injury of skin 07/30/2021   Prostate cancer [facility])    prostate  Renal insufficiency 09/26/2021   Respiratory failure (HCC) 07/29/2021   Sepsis (HCC) 07/12/2021   Syncope 12/18/2015   Varicose veins of lower extremities with other complications 11/28/2013   Past Surgical History:  Procedure Laterality Date    cardiac catheterization  12/29/2009   CATARACT EXTRACTION Bilateral    ECTROPION REPAIR Bilateral 03/27/2022   Procedure: REPAIR OF ECTROPION;  Surgeon: Dairl Ponder, MD;  Location: [facility] OR;  Service: Ophthalmology;  Laterality: Bilateral;   ENDOVENOUS ABLATION SAPHENOUS VEIN W/ LASER Right 03/29/2014   EVLA right greater saphenous vein      gold seed implants  08/31/2009   treatment of prostate cancer   HERNIA REPAIR Bilateral    LITHOTRIPSY  08/31/2008   PROSTATE SURGERY     pt reports he "think they removed" his prostate   right ear surgery      Social History:  reports that he quit smoking about 23 years ago. His smoking use included cigarettes. He started smoking about 63 years ago. He has a 100 pack-year smoking history. He has never used smokeless tobacco. He reports that he does not drink alcohol and does not use drugs.   Allergies  Allergen Reactions   Ace Inhibitors Cough    Family History  Problem Relation Age of Onset   Cancer - Colon Mother        deceased   Hypertension Mother    Diabetes Mother    Emphysema Sister        deceased   Heart disease Sister    Asthma Sister    Kidney failure Father    Heart failure Father      Prior to Admission medications   Medication Sig Start Date End Date Taking? Authorizing Provider  acetaminophen (TYLENOL) 325 MG tablet Take 2 tablets (650 mg total) by mouth every 6 (six) hours as needed for mild pain (pain score 1-3) (or Fever >/= 101). 07/16/23   Loyce Dys, MD  albuterol (VENTOLIN HFA) 108 (90 Base) MCG/ACT inhaler Inhale 2 puffs into the lungs every 6 (six) hours as needed for wheezing or shortness of breath.    [provider]  amoxicillin-clavulanate (AUGMENTIN) 500-125 MG tablet Take 1 tablet by mouth 2 (two) times daily for 4 days. 08/03/23 08/07/23  Burnadette Pop, MD  ascorbic acid (VITAMIN C) 500 MG tablet Take 500 mg by mouth daily.    [provider]  aspirin EC 81 MG tablet Take 81 mg by mouth in the morning.    [provider]  atorvastatin (LIPITOR) 40 MG tablet Take 40 mg by mouth every evening.    [provider]  fenofibrate 160 MG tablet Take 160 mg by mouth in the morning.    [provider]  FLOVENT HFA 110 MCG/ACT inhaler Inhale 2 puffs into the lungs 2 (two) times daily as needed (Shortness of  breath). 09/09/21   [provider]  furosemide (LASIX) 20 MG tablet Take 1 tablet (20 mg total) by mouth daily. 08/04/23   Burnadette Pop, MD  guaiFENesin (MUCINEX) 600 MG 12 hr tablet Take 1 tablet (600 mg total) by mouth 2 (two) times daily for 5 days. 08/03/23 08/08/23  Burnadette Pop, MD  hydrOXYzine (ATARAX) 25 MG tablet Take 1 tablet (25 mg total) by mouth every 8 (eight) hours as needed. 08/03/23   Burnadette Pop, MD  ipratropium-albuterol (DUONEB) 0.5-2.5 (3) MG/3ML SOLN Take 3 mLs by nebulization in the morning and at bedtime. 06/30/23   [provider]  melatonin  5 MG TABS Take 5-19 mg by mouth at bedtime.    [provider]  Multiple Vitamins-Minerals (CENTRUM SILVER 50+MEN) TABS Take 1 tablet by mouth in the morning.    [provider]  Omega-3 Fatty Acids (FISH OIL) 1000 MG CAPS Take 1,000 mg by mouth in the morning and at bedtime.    [provider]  OXYGEN Inhale 2.5-3 L/min into the lungs as needed (for shortness of breath).    [provider]  pantoprazole (PROTONIX) 40 MG tablet Take 40 mg by mouth 2 (two) times daily. 04/15/23   [provider]  vitamin E 180 MG (400 UNITS) capsule Take 400 Units by mouth daily.    [provider]    Physical Exam: BP (!) 123/42   Pulse 75   Temp 97.8 F (36.6 C) (Oral)   Resp 19   Ht 5\' 10"  (1.778 m)   SpO2 100%   BMI 20.53 kg/m   General:  Alert, oriented, calm, in no acute distress, elderly gentleman currently wearing 2 L nasal cannula oxygen Eyes: EOMI, clear conjuctivae, white sclerea Neck: supple, no masses, trachea mildline  Cardiovascular: RRR, no murmurs or rubs, no peripheral edema  Respiratory: Breath sounds are distant, diminished at the bilateral bases.  No active wheezing, rhonchi, or tachypnea Abdomen: soft, nontender, nondistended, normal bowel tones heard  Skin: dry, no rashes  Musculoskeletal: no joint effusions, normal range of motion   Psychiatric: appropriate affect, normal speech  Neurologic: extraocular muscles intact, clear speech, moving all extremities with intact sensorium         Labs on Admission:  Basic Metabolic Panel: Recent Labs  Lab 07/31/23 1754 07/31/23 2335 08/01/23 0826 08/02/23 0831 08/03/23 0652 08/05/23 0341  NA 138  --  133* 135 133* 135  K 4.2  --  3.8 3.4* 4.1 3.8  CL 104  --  101 100 97* 97*  CO2  --   --  22 27 27 30   GLUCOSE 96  --  96 140* 94 105*  BUN 16  --  17 22 31* 40*  CREATININE 1.30*  --  1.42* 1.55* 1.91* 1.36*  CALCIUM  --   --  8.6* 8.5* 8.5* 8.8*  MG  --  1.5* 1.9  --   --  1.9   Liver Function Tests: Recent Labs  Lab 08/05/23 0341  AST 22  ALT 18  ALKPHOS 44  BILITOT 0.8  PROT 6.4*  ALBUMIN 2.6*   No results for input(s): "LIPASE", "AMYLASE" in the last 168 hours. No results for input(s): "AMMONIA" in the last 168 hours. CBC: Recent Labs  Lab 07/31/23 1739 07/31/23 1754 08/01/23 0826 08/02/23 0831 08/05/23 0341  WBC 14.1*  --  16.6* 10.2 5.5  NEUTROABS 12.9*  --   --   --  3.5  HGB 10.0* 11.2* 8.9* 9.4* 9.3*  HCT 31.6* 33.0* 28.4* 29.1* 29.1*  MCV 91.6  --  91.0 90.9 91.2  PLT 434*  --  367 391 393   Cardiac Enzymes: No results for input(s): "CKTOTAL", "CKMB", "CKMBINDEX", "TROPONINI" in the last 168 hours.  BNP (last 3 results) Recent Labs    08/01/23 0826 08/05/23 0341  BNP 2,372.2* 860.9*    ProBNP (last 3 results) No results for input(s): "PROBNP" in the last 8760 hours.  CBG: No results for input(s): "GLUCAP" in the last 168 hours.  Radiological Exams on Admission: CT Angio Chest PE W and/or Wo Contrast  Result Date: 08/05/2023 CLINICAL DATA:  87 year old male with altered mental status, delirium, recently hospitalized for pneumonia. EXAM: CT ANGIOGRAPHY CHEST WITH CONTRAST TECHNIQUE: Multidetector CT imaging of the chest was performed using the standard protocol during bolus administration of intravenous contrast. Multiplanar CT  image reconstructions and MIPs were obtained to evaluate the vascular anatomy. RADIATION DOSE REDUCTION: This exam was performed according to the departmental dose-optimization program which includes automated exposure control, adjustment of the mA and/or kV according to patient size and/or use of iterative reconstruction technique. CONTRAST:  75mL OMNIPAQUE IOHEXOL 350 MG/ML SOLN COMPARISON:  Portable chest this morning.  CTA chest 07/31/2022. FINDINGS: Cardiovascular: Excellent contrast bolus timing in the pulmonary arterial tree. Mild respiratory motion. No pulmonary artery filling defect. Calcified aortic atherosclerosis. Calcified coronary artery atherosclerosis. Stable heart size, mild cardiomegaly. No pericardial effusion. Little contrast in the aorta. Mediastinum/Nodes: Stable, negative. Lungs/Pleura: Lower lung volumes. Atelectatic changes to the major airways which remain patent. Layering pleural effusions, up to moderate on the right and small on the left. Superimposed bilateral lower lobe atelectasis versus consolidation (some enhancement of the affected parenchyma on the right). Subtle superimposed sub solid peribronchial nodular opacity in the anterior basal segment of the right lower lobe and throughout the right middle lobe (series 12, image 86). No other convincing pulmonary inflammation. Some underlying chronic lung changes. Upper Abdomen: Negative visible liver, gallbladder, spleen, pancreas, adrenal glands and stomach. Diverticulosis of the visible colon. Partially visible renal multi cystic disease on the right. Musculoskeletal: Widespread thoracic spinal ankylosis appears related to Diffuse idiopathic skeletal hyperostosis (DISH). No acute or suspicious osseous lesion identified. Review of the MIP images confirms the above findings. IMPRESSION: 1. Negative for acute pulmonary embolus. 2. Lower lung volumes with bilateral up to moderate right and small left layering pleural effusions. Bilateral  lower lobe atelectasis versus Pneumonia. And there is evidence of mild Bronchopneumonia in the right middle lobe. 3.  Aortic Atherosclerosis (ICD10-I70.0). Electronically Signed   By: Odessa Fleming M.D.   On: 08/05/2023 06:36   CT Head Wo Contrast  Result Date: 08/05/2023 CLINICAL DATA:  87 year old male with altered mental status, delirium, recently hospitalized for pneumonia. EXAM: CT HEAD WITHOUT CONTRAST TECHNIQUE: Contiguous axial images were obtained from the base of the skull through the vertex without intravenous contrast. RADIATION DOSE REDUCTION: This exam was performed according to the departmental dose-optimization program which includes automated exposure control, adjustment of the mA and/or kV according to patient size and/or use of iterative reconstruction technique. COMPARISON:  Head CT 07/14/2023. FINDINGS: Brain: Stable cerebral volume. Cavum septum pellucidum, normal variant. Chronic lacunar infarct right caudate. No midline shift, ventriculomegaly, mass effect, evidence of mass lesion, intracranial hemorrhage or evidence of cortically based acute infarction. Outside of the right caudate gray-white differentiation appears normal for age. Vascular: No suspicious intracranial vascular hyperdensity. Calcified atherosclerosis at the skull base. Skull: Intact.  No acute osseous abnormality identified. Sinuses/Orbits: Unresolved right middle ear and right greater than left mastoid opacification. Negative visible nasopharynx. Left tympanic cavity remains pneumatized. But these findings are chronic and not progressed from January of 2023. Minimal paranasal sinus mucosal thickening, stable to improved. Other: No acute orbit or scalp soft tissue finding. IMPRESSION: 1. No acute intracranial abnormality. 2. Chronic lacunar infarct right caudate. 3. Chronic likely Postinflammatory opacification of the right middle ear, mastoids. Electronically Signed   By: Odessa Fleming M.D.   On: 08/05/2023 06:31   DG Chest  Portable 1 View  Result Date: 08/05/2023 CLINICAL DATA:  87 year old male with increasing shortness of breath. Recent  pneumonia. EXAM: PORTABLE CHEST 1 VIEW COMPARISON:  Portable chest 07/31/2023 and earlier. FINDINGS: Portable AP semi upright view at 0327 hours. Stable lung volumes and mediastinal contours. Calcified aortic atherosclerosis. Increased veiling opacity at both lung bases now, with less pronounced but unresolved confluent right lung base peribronchial opacity since 07/31/2023. No pneumothorax. No pulmonary edema. Stable visualized osseous structures. IMPRESSION: Evidence of new or increased bilateral pleural effusions superimposed on regressed but not resolved right lung base pneumonia. PA and lateral views of the chest may be valuable when feasible. Electronically Signed   By: Odessa Fleming M.D.   On: 08/05/2023 04:41    Assessment/Plan Harry Norris is a 87 y.o. male with medical history significant for COPD on as needed oxygen, diabetes, hypertension, hyperlipidemia, chronic combined systolic/diastolic CHF, CKD stage IIIb recently discharged from [facility] on 12/3 after stay for community-acquired pneumonia and acute renal failure who is now being admitted to the hospital with NSTEMI.   NSTEMI-unclear to me why the patient called EMS early this morning, and he is really unable to recall.  He does however adamantly deny any chest pain.  No acute EKG changes.  Troponin is ~1000 and flat.  Echo 08/01/2023 demonstrates LV with global hypokinesis, EF about 20%. -Inpatient admission to stepdown -Continue IV heparin drip -Discussed with cardiology, who will consult -Continuous cardiac telemetry  Acute on chronic hypoxia-at baseline patient is on 2 L nasal cannula oxygen as needed, this morning he was found to be hypoxic on this, and was placed on 4 L.  CT scan has ruled out PE, or worsening consolidation.  I do not think he is in acute heart failure. -Continue supplemental oxygen -Continue to treat  community-acquired pneumonia as below  Community-acquired pneumonia-treated empirically with IV cefepime during his recent hospital stay, and transition to oral Augmentin at discharge for total of 4 days. -Will empirically continue IV azithromycin and IV Rocephin while in the hospital, for another 3 days  CKD stage IIIb-he was aggressively diuresed during his last hospital stay, developed some acute kidney injury superimposed on CKD.  He was discharged on half dose of Lasix 20 mg p.o. daily, his renal function is now improved and stable.  Chronic combined systolic/diastolic CHF-on exam today, patient appears euvolemic to very minimally volume up.  No evidence of acute heart failure exacerbation currently. -Will continue Lasix 20 mg p.o. daily  Hyperlipidemia-continue fibrate and Lipitor  Diabetes-seems to be diet controlled, with last A1c less than 6  Urinary retention-Per nursing staff, he was retaining on his previous admission and had a Foley catheter.  Currently he has 300 cc of urine in his bladder, and has been unable to urinate this morning. -Place Foley catheter -Plan trial of void in 24 hours  Deconditioning-patient was noted to decline SNF admission though it was previously recommended.  Will consult PT/OT once again.  DVT prophylaxis: On heparin drip as above    Code Status: Limited: Do not attempt resuscitation (DNR) -DNR-LIMITED -Do Not Intubate/DNI   Consults called: None  Admission status: The appropriate patient status for this patient is INPATIENT. Inpatient status is judged to be reasonable and necessary in order to provide the required intensity of service to ensure the patient's safety. The patient's presenting symptoms, physical exam findings, and initial radiographic and laboratory data in the context of their chronic comorbidities is felt to place them at high risk for further clinical deterioration. Furthermore, it is not anticipated that the patient will be  medically stable for discharge from  the hospital within 2 midnights of admission.    I certify that at the point of admission it is my clinical judgment that the patient will require inpatient hospital care spanning beyond 2 midnights from the point of admission due to high intensity of service, high risk for further deterioration and high frequency of surveillance required  Time spent: 59 minutes  Brehanna Deveny Sharlette Dense MD [facility] Pager [phone]  If 7PM-7AM, please contact night-coverage [email or web address] Password [facility]  08/05/2023, 8:44 AM

## 2023-08-05 NOTE — ED Notes (Signed)
Pt voided 250 mLs while standing and using the urinal

## 2023-08-05 NOTE — Progress Notes (Addendum)
PHARMACY - ANTICOAGULATION CONSULT NOTE  Pharmacy Consult for Heparin Indication: chest pain/ACS  Allergies  Allergen Reactions   Ace Inhibitors Cough    Patient Measurements: Height: 5\' 10"  (177.8 cm) IBW/kg (Calculated) : 73 Heparin Dosing Weight: actual body weight  Vital Signs: Temp: 97.5 F (36.4 C) (12/05 0249) BP: 123/42 (12/05 0630) Pulse Rate: 75 (12/05 0630)  Labs: Recent Labs    08/02/23 0831 08/03/23 0652 08/05/23 0341  HGB 9.4*  --  9.3*  HCT 29.1*  --  29.1*  PLT 391  --  393  CREATININE 1.55* 1.91* 1.36*  TROPONINIHS  --   --  1,081*    Estimated Creatinine Clearance: 36.5 mL/min (A) (by C-G formula based on SCr of 1.36 mg/dL (H)).  Medications:  No oral anticoagulation PTA  Assessment: 87 yr male with shortness of breath and altered mental status.  Recent discharge on 12/3 for pneumonia.  Troponin = 1081 08/05/2023 First heparin level undetectable drawn 6 hours after 3500 unit bolus and heparin at 800 units/hr Per RN heparin is infusing without issues. No bleeding reported.  Goal of Therapy:  Heparin level 0.3-0.7 units/ml Monitor platelets by anticoagulation protocol: Yes   Plan:  Heparin 2000 unit IV bolus x 1 Increase Heparin gtt to 1000 units/hr Check heparin level 8 hr after bolus & rate increase Daily heparin level & CBC  Herby Abraham, Pharm.D Use secure chat for questions 08/05/2023 3:37 PM

## 2023-08-05 NOTE — Plan of Care (Signed)
  Problem: Education: Goal: Knowledge of General Education information will improve Description: Including pain rating scale, medication(s)/side effects and non-pharmacologic comfort measures Outcome: Not Progressing   Problem: Clinical Measurements: Goal: Ability to maintain clinical measurements within normal limits will improve Outcome: Not Progressing Goal: Will remain free from infection Outcome: Not Progressing Goal: Diagnostic test results will improve Outcome: Not Progressing Goal: Respiratory complications will improve Outcome: Not Progressing Goal: Cardiovascular complication will be avoided Outcome: Not Progressing   

## 2023-08-05 NOTE — Consult Note (Addendum)
Cardiology Consultation   Patient ID: Harry Norris MRN: [id number]; DOB: 30-Dec-1935  Admit date: 08/05/2023 Date of Consult: 08/06/2023  PCP:  Paulina Fusi, MD   [facility] Providers Cardiologist:  None   {  Patient Profile:   ISRRAEL Norris is a 87 y.o. male with a hx of chronic HFrEF, left bundle branch block, PAD, chronic venous insufficiency, AAA previously followed by vascular surgery, prostate cancer, COPD, CKD, hypertension, hyperlipidemia, prostate cancer, who is being seen 08/06/2023 for the evaluation of chest pain at the request of Dr. Kirby Crigler.  History of Present Illness:   [name] is followed by Dr. Dulce Sellar in [facility] for chronic HFrEF.  He has had previous cardiac catheterization in 2011, unknown results of this.  Also does not appear that he has been on any GDMT, previously was on losartan however this was not present on his PTA medications.  EF has persistently been depressed and gradually declining since at least 2019 when it was 35%.  Currently patient is being evaluated for elevated troponins of troponins are flat 1081, 1018. Patient was just recently admitted from the hospital and discharged 08/03/2023 for recurrent nosocomial pneumonia and had some acute metabolic encephalopathy.  We had seen him briefly at his prior admission for volume assessment who at the time appeared to be over diuresed with AKI.  We cut back his Lasix dose to 20 mg daily.   Today he is accompanied with his son and son's wife.  Patient is slightly confused so much of the report was via family members.  He states that yesterday he had called EMS and had been watching him on his video cameras because he lives alone and had noted that he had been more agitated and had been complaining of diffuse pain of uncertain location.  Patient currently says he has no pain and does not remember being in pain but family reports that he is far from baseline and acting differently.  It is  possible that he had been complaining of some lower abdominal pain and some back pain however he changes his answers and sometimes denies having any pain at all.  Both patient and family members adamantly deny any chest pain.  EMS had noted that he was hypoxic with oxygen saturations at 78% on home 2 L via nasal cannula.  CT of the head negative.  CTA negative for PE.  Did show lower lung volumes with bilateral moderate right right and small left pleural effusion.  Evidence of bronchopneumonia and right middle lobe.  Creatinine has improved from prior admission currently 1.26.  BNP also improved, 868 down from 2300+.  Hemoglobin 9.3.  Normal WBC.   Denies any chest pain, shortness of breath, peripheral edema, orthopnea.    Past Medical History:  Diagnosis Date   AAA (abdominal aortic aneurysm) (HCC)    Acute hypoxemic respiratory failure (HCC) 07/12/2021   Anemia    Asthma 02/07/2018   Benign neoplasm of colon    Blood in the stool    Bradycardia 12/19/2018   Bronchitis    Cancer (HCC)    prostate   Cardiomyopathy (HCC) 12/18/2015   Carotid artery occlusion    Chronic obstructive pulmonary disease with acute exacerbation (HCC) 07/12/2021   Chronic rhinitis 02/26/2016   Chronic venous insufficiency 12/18/2015   CKD (chronic kidney disease), stage III (HCC) 07/12/2021   Community acquired pneumonia of right lower lobe of lung 07/12/2021   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  PFTs 01/18/14:  FeV1 1.58 45% FVC 2.0 50% FeV1/FVC 79% Fef 25-75 795 ( 40% improved after BD) CXR: Copd changes ONO 03/07/14 desats to <88% on RA    Diarrhea    Diverticulitis    DM type 2 (diabetes mellitus, type 2) (HCC)    patient denies every being diagnosed with diabetes   Fatigue 12/12/2018   Hyperlipidemia    Hypertension    Hypertensive heart disease    Hypoxemia 04/10/2014   Overview:  Last Assessment & Plan:  Cont nocturnal oxygen therapy 2L    Influenza 07/29/2021   Kidney stone    LBBB (left  bundle branch block) 12/18/2015   Leg swelling 11/28/2013   Orthostatic hypotension 12/19/2018   Other symptoms involving cardiovascular system 11/28/2013   Pressure injury of skin 07/30/2021   Prostate cancer (HCC)    prostate    Renal insufficiency 09/26/2021   Respiratory failure (HCC) 07/29/2021   Sepsis (HCC) 07/12/2021   Syncope 12/18/2015   Varicose veins of lower extremities with other complications 11/28/2013    Past Surgical History:  Procedure Laterality Date    cardiac catheterization  12/29/2009   CATARACT EXTRACTION Bilateral    ECTROPION REPAIR Bilateral 03/27/2022   Procedure: REPAIR OF ECTROPION;  Surgeon: Dairl Ponder, MD;  Location: [facility] OR;  Service: Ophthalmology;  Laterality: Bilateral;   ENDOVENOUS ABLATION SAPHENOUS VEIN W/ LASER Right 03/29/2014   EVLA right greater saphenous vein     gold seed implants  08/31/2009   treatment of prostate cancer   HERNIA REPAIR Bilateral    LITHOTRIPSY  08/31/2008   PROSTATE SURGERY     pt reports he "think they removed" his prostate   right ear surgery       Inpatient Medications: Scheduled Meds:  aspirin EC  81 mg Oral Daily   atorvastatin  40 mg Oral QPM   Chlorhexidine Gluconate Cloth  6 each Topical Daily   feeding supplement  237 mL Oral BID BM   fenofibrate  160 mg Oral Daily   furosemide  20 mg Oral Daily   pantoprazole  40 mg Oral BID   Continuous Infusions:  azithromycin Stopped (08/05/23 1120)   cefTRIAXone (ROCEPHIN)  IV Stopped (08/05/23 1051)   heparin 1,000 Units/hr (08/05/23 1900)   PRN Meds: acetaminophen **OR** acetaminophen, albuterol, ondansetron **OR** ondansetron (ZOFRAN) IV  Allergies:    Allergies  Allergen Reactions   Ace Inhibitors Cough    Social History:   Social History   Socioeconomic History   Marital status: Married    Spouse name: Not on file   Number of children: Not on file   Years of education: Not on file   Highest education level: Not on file  Occupational  History   Not on file  Tobacco Use   Smoking status: Former    Current packs/day: 0.00    Average packs/day: 2.5 packs/day for 40.0 years (100.0 ttl pk-yrs)    Types: Cigarettes    Start date: 09/01/1959    Quit date: 09/01/1999    Years since quitting: 23.9   Smokeless tobacco: Never  Vaping Use   Vaping status: Never Used  Substance and Sexual Activity   Alcohol use: No   Drug use: No   Sexual activity: Not on file  Other Topics Concern   Not on file  Social History Narrative   Lives with wife.   Retired.   Telephone business prior.   Social Determinants of Health   Financial Resource Strain: Not on  file  Food Insecurity: No Food Insecurity (08/05/2023)   Hunger Vital Sign    Worried About Running Out of Food in the Last Year: Never true    Ran Out of Food in the Last Year: Never true  Transportation Needs: No Transportation Needs (08/05/2023)   PRAPARE - Administrator, Civil Service (Medical): No    Lack of Transportation (Non-Medical): No  Physical Activity: Not on file  Stress: Not on file  Social Connections: Not on file  Intimate Partner Violence: Unknown (08/05/2023)   Humiliation, Afraid, Rape, and Kick questionnaire    Fear of Current or Ex-Partner: No    Emotionally Abused: No    Physically Abused: No    Sexually Abused: Patient declined    Family History:   Family History  Problem Relation Age of Onset   Cancer - Colon Mother        deceased   Hypertension Mother    Diabetes Mother    Emphysema Sister        deceased   Heart disease Sister    Asthma Sister    Kidney failure Father    Heart failure Father      ROS:  Please see the history of present illness.  All other ROS reviewed and negative.     Physical Exam/Data:   Vitals:   08/05/23 1700 08/05/23 1800 08/05/23 1900 08/05/23 2000  BP: (!) 130/41 (!) 126/31 (!) 129/38 (!) 144/55  Pulse: 81 88 84 95  Resp: (!) 28 (!) 23 20 (!) 23  Temp:    97.7 F (36.5 C)  TempSrc:    Oral   SpO2: 99% 100% 100% 100%  Weight:      Height:        Intake/Output Summary (Last 24 hours) at 08/06/2023 0005 Last data filed at 08/05/2023 1900 Gross per 24 hour  Intake 971.69 ml  Output 500 ml  Net 471.69 ml       08/05/2023    3:00 PM 08/03/2023    6:08 AM 08/02/2023    4:11 AM  Last 3 Weights  Weight (lbs) 136 lb 11 oz 143 lb 1.3 oz 141 lb 15.6 oz  Weight (kg) 62 kg 64.9 kg 64.4 kg     Body mass index is 19.61 kg/m.  General:  Well nourished, well developed, in no acute distress.  HEENT: normal Neck: no JVD Vascular: No carotid bruits; Distal pulses 2+ bilaterally Cardiac:  2/6 murmur.  Lungs: Crackles diffusely, worse than prior mission Abd: soft, nontender, no hepatomegaly  Ext: no edema Musculoskeletal:  No deformities, BUE and BLE strength normal and equal Skin: warm and dry.  Decreased skin turgor Neuro:  CNs 2-12 intact, no focal abnormalities noted Psych:  Normal affect   EKG:  The EKG was personally reviewed and demonstrates: Sinus, heart rate 74.  Frequent PVCs.  Left bundle branch block.  No acute ST-T wave changes Telemetry:  Telemetry was personally reviewed and demonstrates: Normal sinus rhythm with heart rates in the 80s, PACs and PVCs.  Relevant CV Studies: Echocardiogram 08/01/2023 1. Left ventricular ejection fraction, by estimation, is 20 to 25%. The  left ventricle has severely decreased function. The left ventricle  demonstrates global hypokinesis. The left ventricular internal cavity size  was mildly dilated. There is mild  asymmetric left ventricular hypertrophy of the infero-lateral segment.  Indeterminate diastolic filling due to E-A fusion.   2. Right ventricular systolic function is normal. The right ventricular  size is normal.  Tricuspid regurgitation signal is inadequate for assessing  PA pressure.   3. Left atrial size was mildly dilated.   4. The mitral valve is grossly normal. Trivial mitral valve  regurgitation. No evidence of  mitral stenosis.   5. The aortic valve is tricuspid. There is mild calcification of the  aortic valve. Aortic valve regurgitation is trivial. Aortic valve  sclerosis is present, with no evidence of aortic valve stenosis.   6. The inferior vena cava is dilated in size with <50% respiratory  variability, suggesting right atrial pressure of 15 mmHg.   Comparison(s): No significant change from prior study.   Conclusion(s)/Recommendation(s): No left ventricular mural or apical  thrombus/thrombi.   Laboratory Data:  High Sensitivity Troponin:   Recent Labs  Lab 08/05/23 0341 08/05/23 0522 08/05/23 1324 08/05/23 1417  TROPONINIHS 1,081* 1,018* 794* 798*     Chemistry Recent Labs  Lab 07/31/23 1754 07/31/23 2335 08/01/23 0826 08/02/23 0831 08/03/23 0652 08/05/23 0341  NA  --   --  133* 135 133* 135  K  --   --  3.8 3.4* 4.1 3.8  CL  --   --  101 100 97* 97*  CO2   < >  --  22 27 27 30   GLUCOSE  --   --  96 140* 94 105*  BUN  --   --  17 22 31* 40*  CREATININE  --   --  1.42* 1.55* 1.91* 1.36*  CALCIUM   < >  --  8.6* 8.5* 8.5* 8.8*  MG  --  1.5* 1.9  --   --  1.9  GFRNONAA   < >  --  48* 44* 34* 51*  ANIONGAP   < >  --  10 8 9 8    < > = values in this interval not displayed.    Recent Labs  Lab 08/05/23 0341  PROT 6.4*  ALBUMIN 2.6*  AST 22  ALT 18  ALKPHOS 44  BILITOT 0.8   Lipids No results for input(s): "CHOL", "TRIG", "HDL", "LABVLDL", "LDLCALC", "CHOLHDL" in the last 168 hours.  Hematology Recent Labs  Lab 08/01/23 0826 08/02/23 0831 08/05/23 0341  WBC 16.6* 10.2 5.5  RBC 3.12* 3.20* 3.19*  HGB 8.9* 9.4* 9.3*  HCT 28.4* 29.1* 29.1*  MCV 91.0 90.9 91.2  MCH 28.5 29.4 29.2  MCHC 31.3 32.3 32.0  RDW 17.2* 17.1* 16.9*  PLT 367 391 393   Thyroid No results for input(s): "TSH", "FREET4" in the last 168 hours.  BNP Recent Labs  Lab 08/01/23 0826 08/05/23 0341  BNP 2,372.2* 860.9*    DDimer No results for input(s): "DDIMER" in the last 168  hours.   Radiology/Studies:  CT Angio Chest PE W and/or Wo Contrast  Result Date: 08/05/2023 CLINICAL DATA:  87 year old male with altered mental status, delirium, recently hospitalized for pneumonia. EXAM: CT ANGIOGRAPHY CHEST WITH CONTRAST TECHNIQUE: Multidetector CT imaging of the chest was performed using the standard protocol during bolus administration of intravenous contrast. Multiplanar CT image reconstructions and MIPs were obtained to evaluate the vascular anatomy. RADIATION DOSE REDUCTION: This exam was performed according to the departmental dose-optimization program which includes automated exposure control, adjustment of the mA and/or kV according to patient size and/or use of iterative reconstruction technique. CONTRAST:  75mL OMNIPAQUE IOHEXOL 350 MG/ML SOLN COMPARISON:  Portable chest this morning.  CTA chest 07/31/2022. FINDINGS: Cardiovascular: Excellent contrast bolus timing in the pulmonary arterial tree. Mild respiratory motion. No pulmonary artery filling defect. Calcified  aortic atherosclerosis. Calcified coronary artery atherosclerosis. Stable heart size, mild cardiomegaly. No pericardial effusion. Little contrast in the aorta. Mediastinum/Nodes: Stable, negative. Lungs/Pleura: Lower lung volumes. Atelectatic changes to the major airways which remain patent. Layering pleural effusions, up to moderate on the right and small on the left. Superimposed bilateral lower lobe atelectasis versus consolidation (some enhancement of the affected parenchyma on the right). Subtle superimposed sub solid peribronchial nodular opacity in the anterior basal segment of the right lower lobe and throughout the right middle lobe (series 12, image 86). No other convincing pulmonary inflammation. Some underlying chronic lung changes. Upper Abdomen: Negative visible liver, gallbladder, spleen, pancreas, adrenal glands and stomach. Diverticulosis of the visible colon. Partially visible renal multi cystic  disease on the right. Musculoskeletal: Widespread thoracic spinal ankylosis appears related to Diffuse idiopathic skeletal hyperostosis (DISH). No acute or suspicious osseous lesion identified. Review of the MIP images confirms the above findings. IMPRESSION: 1. Negative for acute pulmonary embolus. 2. Lower lung volumes with bilateral up to moderate right and small left layering pleural effusions. Bilateral lower lobe atelectasis versus Pneumonia. And there is evidence of mild Bronchopneumonia in the right middle lobe. 3.  Aortic Atherosclerosis (ICD10-I70.0). Electronically Signed   By: Odessa Fleming M.D.   On: 08/05/2023 06:36   CT Head Wo Contrast  Result Date: 08/05/2023 CLINICAL DATA:  87 year old male with altered mental status, delirium, recently hospitalized for pneumonia. EXAM: CT HEAD WITHOUT CONTRAST TECHNIQUE: Contiguous axial images were obtained from the base of the skull through the vertex without intravenous contrast. RADIATION DOSE REDUCTION: This exam was performed according to the departmental dose-optimization program which includes automated exposure control, adjustment of the mA and/or kV according to patient size and/or use of iterative reconstruction technique. COMPARISON:  Head CT 07/14/2023. FINDINGS: Brain: Stable cerebral volume. Cavum septum pellucidum, normal variant. Chronic lacunar infarct right caudate. No midline shift, ventriculomegaly, mass effect, evidence of mass lesion, intracranial hemorrhage or evidence of cortically based acute infarction. Outside of the right caudate gray-white differentiation appears normal for age. Vascular: No suspicious intracranial vascular hyperdensity. Calcified atherosclerosis at the skull base. Skull: Intact.  No acute osseous abnormality identified. Sinuses/Orbits: Unresolved right middle ear and right greater than left mastoid opacification. Negative visible nasopharynx. Left tympanic cavity remains pneumatized. But these findings are chronic and  not progressed from January of 2023. Minimal paranasal sinus mucosal thickening, stable to improved. Other: No acute orbit or scalp soft tissue finding. IMPRESSION: 1. No acute intracranial abnormality. 2. Chronic lacunar infarct right caudate. 3. Chronic likely Postinflammatory opacification of the right middle ear, mastoids. Electronically Signed   By: Odessa Fleming M.D.   On: 08/05/2023 06:31   DG Chest Portable 1 View  Result Date: 08/05/2023 CLINICAL DATA:  87 year old male with increasing shortness of breath. Recent pneumonia. EXAM: PORTABLE CHEST 1 VIEW COMPARISON:  Portable chest 07/31/2023 and earlier. FINDINGS: Portable AP semi upright view at 0327 hours. Stable lung volumes and mediastinal contours. Calcified aortic atherosclerosis. Increased veiling opacity at both lung bases now, with less pronounced but unresolved confluent right lung base peribronchial opacity since 07/31/2023. No pneumothorax. No pulmonary edema. Stable visualized osseous structures. IMPRESSION: Evidence of new or increased bilateral pleural effusions superimposed on regressed but not resolved right lung base pneumonia. PA and lateral views of the chest may be valuable when feasible. Electronically Signed   By: Odessa Fleming M.D.   On: 08/05/2023 04:41     Assessment and Plan:   NSTEMI This is likely type II secondary  to his progressive pneumonia.  He exhibits no chest pain, there are no acute ST-T wave changes on his EKG.  Troponins are elevated around 1000 but flat.  For now, would treat medically. Will hold of on IV Heparin for now as this is demand ischemia and not Type I physiology.  I discussed with family that theres a low suspicion that this is an MI during his hospitalization.  Continue aspirin, atorvastatin 40 mg.  With concerns of possible sepsis will defer beta-blocker. Continue IV heparin for now, can stop after 48 hours.  Chronic HFrEF 20-25% Has had gradually declining EF since 2019.  Uncertain etiology of  cardiomyopathy, has left bundle branch block and a heart catheterization in 2011 with unknown results.  Has not been on GDMT.  EF from recent admission shows 20 to 25% with normal RV function.  Clinically looks euvolemic, suspect that his bilateral pleural effusions are parapneumonic secondary to his pneumonia rather than heart failure.  Additionally, BNP is downtrending. Eventually will initiate SGLT2 inhibitor once he is more stable or as outpatient.  Previously was on losartan but was discontinued at some point.   Titrate GDMT once more stable and blood pressure allows. Continue p.o. Lasix 20 mg.  CKD Creatinine much improved from prior admission 1.36 currently  HAP with acute on chronic hypoxic respiratory failure on 2 L supplemental oxygen Antibiotics per primary team.  Hyperlipidemia On statin and fenofibrate.   Risk Assessment/Risk Scores:   New York Heart Association (NYHA) Functional Class NYHA Class II   For questions or updates, please contact [facility] Please consult [email or web address] for contact info under    Signed, Abagail Kitchens, PA-C  08/05/2023 11:30 AM   ADDENDUM:   Patient seen and examined with Yvonna Alanis. .  I personally taken a history, examined the patient, reviewed relevant notes,  laboratory data / imaging studies.  I performed a substantive portion of this encounter and formulated the important aspects of the plan.  I agree with the APP's note, impression, and recommendations; however, I have edited the note to reflect changes or salient points.  Patient has a known complex cardiovascular history and multiple comorbidities was discharged from the hospital on 08/03/2023 due to hospital-acquired pneumonia with metabolic encephalopathy, acute febrile illness, and dyspnea.  He was seen by cardiology during her hospitalization was asked to restart Lasix and 08/05/2023 and up titration of GDMT when he follows up with outpatient cardiology.  But he  presents back to the ED via EMS due to worsening shortness of breath, productive cough, hypoxia with pulse ox of 78% when checked by EMS, requiring nasal cannula oxygen.  In the emergency room department high since her troponins were checked and the initial troponin was 1081.  Patient denied having any chest pain.  However, cardiology consult was requested by attending physician due to concerns for possible NSTEMI.  At the time of my evaluation, patient is sitting upright alert oriented x 4.  Denies anginal chest pain or heart failure symptoms.  Denies near-syncope or syncopal events.  No recent falls.  No family present at bedside.  PHYSICAL EXAM: Today's Vitals   08/05/23 1700 08/05/23 1800 08/05/23 1900 08/05/23 2000  BP: (!) 130/41 (!) 126/31 (!) 129/38 (!) 144/55  Pulse: 81 88 84 95  Resp: (!) 28 (!) 23 20 (!) 23  Temp:    97.7 F (36.5 C)  TempSrc:    Oral  SpO2: 99% 100% 100% 100%  Weight:  Height:      PainSc:       Body mass index is 19.61 kg/m.   Net IO Since Admission: 471.69 mL [08/06/23 0005]  Filed Weights   08/05/23 1500  Weight: 62 kg    Physical Exam  Constitutional: No distress. He appears chronically ill.  hemodynamically stable  Cardiovascular: Normal rate, regular rhythm, S1 normal and S2 normal. Exam reveals no gallop, no S3 and no S4.  No murmur heard. Pulmonary/Chest: Effort normal. No stridor. He has no wheezes. He has no rales.  Decreased breath sounds bilaterally, rhonchi's,  Abdominal: Soft. Bowel sounds are normal. He exhibits no distension. There is no abdominal tenderness.  Musculoskeletal:        General: No edema.     Cervical back: Neck supple.  Neurological: He is alert and oriented to person, place, and time. He has intact cranial nerves (2-12).  Skin: Skin is warm.    EKG: (personally reviewed by me) 08/05/2023 sinus rhythm, 84 bpm, left bundle branch block, nonspecific ST-T changes, without underlying injury pattern,  PVCs  Telemetry: (personally reviewed by me) Sinus rhythm with PVCs   Impression:  Elevated high sensitive troponins -secondary to demand ischemia. Acute on chronic hypoxic respiratory failure due to pneumonia Chronic heart failure with reduced EF, stage C, NYHA class III Known cardiomyopathy Chronic kidney disease stage IIIb. Hyperlipidemia. Diabetes  Recommendations:  Elevated high sensitive troponins -secondary to demand ischemia. Patient is experiencing type II ischemia due to supply/demand mismatch in the setting of worsening pneumonia, hypoxia, underlying cardiomyopathy.  Clinically denies anginal chest pain.  EKG does not illustrate myocardial injury pattern.  High sensitive troponins initially elevated with slowly downtrending, which is reassuring Recently had an echocardiogram less than a week ago which illustrates severely reduced LVEF, see report for additional details.  Given his age, significant cardiovascular history, no chest pain recommend conservative management and addressing underlying root cause which is pneumonia/hypoxia.  No planned ischemic workup during this hospitalization however if there is change in clinical trajectory recommendations may change.   Acute on chronic hypoxic respiratory failure due to pneumonia:  In setting of cardiomyopathy and pneumonia.   Refer management of his chronic hypoxia to the primary team.   Encourage outpatient follow-up postdischarge.  Chronic heart failure with reduced EF, stage C, NYHA class III Known cardiomyopathy: Not in overt heart failure. Was diuresed well during the last hospitalization. BNP is elevated compared to baseline but significantly lower compared to his prior numbers. Continue diuresis. Depending how he does clinically during this hospitalization consider restarting home doses of GDMT prior to discharge with close follow-up with Dr. Dulce Sellar  Hyperlipidemia: Continue atorvastatin 40 mg p.o. daily,  Fenofibrate 160 mg p.o. daily  Diabetes, non-insulin-dependent: Glycemic control per primary team  The patient's presentation during his hospitalization is predominantly pulmonary given the hypoxia on arrival and ongoing pneumonia.  Clinically, he is euvolemic and not in congestive heart failure.  No additional testing is warranted at this time.  Continue to uptitrate GDMT as hemodynamics and laboratory values allow.  Cardiology will sign off for now please reach out if any questions or concerns arise during this hospitalization for which we can be assistance off.    This note was created using a voice recognition software as a result there may be grammatical errors inadvertently enclosed that do not reflect the nature of this encounter. Every attempt is made to correct such errors.   Marcellous Snarski Dorisann Frames, [facility] [place]  [facility]  [phone]  [address] [place], [place] [address] Pager: [phone] Office: [phone] 08/05/2023

## 2023-08-06 ENCOUNTER — Inpatient Hospital Stay (HOSPITAL_COMMUNITY): Payer: Medicare Other

## 2023-08-06 DIAGNOSIS — R7989 Other specified abnormal findings of blood chemistry: Secondary | ICD-10-CM

## 2023-08-06 DIAGNOSIS — J690 Pneumonitis due to inhalation of food and vomit: Secondary | ICD-10-CM

## 2023-08-06 DIAGNOSIS — E119 Type 2 diabetes mellitus without complications: Secondary | ICD-10-CM

## 2023-08-06 DIAGNOSIS — I5022 Chronic systolic (congestive) heart failure: Secondary | ICD-10-CM | POA: Diagnosis not present

## 2023-08-06 DIAGNOSIS — J9621 Acute and chronic respiratory failure with hypoxia: Secondary | ICD-10-CM

## 2023-08-06 DIAGNOSIS — I2489 Other forms of acute ischemic heart disease: Secondary | ICD-10-CM

## 2023-08-06 LAB — URINALYSIS, ROUTINE W REFLEX MICROSCOPIC
Bilirubin Urine: NEGATIVE
Glucose, UA: NEGATIVE mg/dL
Ketones, ur: NEGATIVE mg/dL
Leukocytes,Ua: NEGATIVE
Nitrite: NEGATIVE
Protein, ur: 30 mg/dL — AB
Specific Gravity, Urine: 1.019 (ref 1.005–1.030)
pH: 5 (ref 5.0–8.0)

## 2023-08-06 LAB — BODY FLUID CELL COUNT WITH DIFFERENTIAL
Lymphs, Fluid: 66 %
Monocyte-Macrophage-Serous Fluid: 12 % — ABNORMAL LOW (ref 50–90)
Neutrophil Count, Fluid: 22 % (ref 0–25)
Total Nucleated Cell Count, Fluid: 1320 uL — ABNORMAL HIGH (ref 0–1000)

## 2023-08-06 LAB — BASIC METABOLIC PANEL
Anion gap: 10 (ref 5–15)
BUN: 32 mg/dL — ABNORMAL HIGH (ref 8–23)
CO2: 28 mmol/L (ref 22–32)
Calcium: 8.5 mg/dL — ABNORMAL LOW (ref 8.9–10.3)
Chloride: 98 mmol/L (ref 98–111)
Creatinine, Ser: 1.27 mg/dL — ABNORMAL HIGH (ref 0.61–1.24)
GFR, Estimated: 55 mL/min — ABNORMAL LOW (ref >60)
Glucose, Bld: 92 mg/dL (ref 70–99)
Potassium: 4.1 mmol/L (ref 3.5–5.1)
Sodium: 136 mmol/L (ref 135–145)

## 2023-08-06 LAB — CBC
HCT: 26.3 % — ABNORMAL LOW (ref 39.0–52.0)
Hemoglobin: 8.3 g/dL — ABNORMAL LOW (ref 13.0–17.0)
MCH: 29.5 pg (ref 26.0–34.0)
MCHC: 31.6 g/dL (ref 30.0–36.0)
MCV: 93.6 fL (ref 80.0–100.0)
Platelets: 319 10*3/uL (ref 150–400)
RBC: 2.81 MIL/uL — ABNORMAL LOW (ref 4.22–5.81)
RDW: 17 % — ABNORMAL HIGH (ref 11.5–15.5)
WBC: 6 10*3/uL (ref 4.0–10.5)
nRBC: 0 % (ref 0.0–0.2)

## 2023-08-06 LAB — CULTURE, BLOOD (ROUTINE X 2)
Culture: NO GROWTH
Culture: NO GROWTH
Special Requests: ADEQUATE

## 2023-08-06 LAB — PROTEIN, PLEURAL OR PERITONEAL FLUID: Total protein, fluid: <3 g/dL

## 2023-08-06 LAB — CK: Total CK: 20 U/L — ABNORMAL LOW (ref 49–397)

## 2023-08-06 LAB — HEPARIN LEVEL (UNFRACTIONATED)
Heparin Unfractionated: 0.13 [IU]/mL — ABNORMAL LOW (ref 0.30–0.70)
Heparin Unfractionated: <0.1 [IU]/mL — ABNORMAL LOW (ref 0.30–0.70)
Heparin Unfractionated: 0.22 [IU]/mL — ABNORMAL LOW (ref 0.30–0.70)

## 2023-08-06 MED ORDER — HEPARIN BOLUS VIA INFUSION
1000.0000 [IU] | Freq: Once | INTRAVENOUS | Status: AC
  Administered 2023-08-06: 1000 [IU] via INTRAVENOUS
  Filled 2023-08-06: qty 1000

## 2023-08-06 MED ORDER — MELATONIN 3 MG PO TABS
3.0000 mg | ORAL_TABLET | Freq: Every day | ORAL | Status: DC
  Administered 2023-08-06 – 2023-08-11 (×6): 3 mg via ORAL
  Filled 2023-08-06 (×6): qty 1

## 2023-08-06 MED ORDER — LIDOCAINE HCL 1 % IJ SOLN
INTRAMUSCULAR | Status: AC
  Filled 2023-08-06: qty 20

## 2023-08-06 MED ORDER — HEPARIN BOLUS VIA INFUSION
2000.0000 [IU] | Freq: Once | INTRAVENOUS | Status: AC
  Administered 2023-08-06: 2000 [IU] via INTRAVENOUS
  Filled 2023-08-06: qty 2000

## 2023-08-06 NOTE — Progress Notes (Signed)
PHARMACY - ANTICOAGULATION CONSULT NOTE  Pharmacy Consult for Heparin Indication: chest pain/ACS  Allergies  Allergen Reactions   Ace Inhibitors Cough    Patient Measurements: Height: 5\' 10"  (177.8 cm) Weight: 62 kg (136 lb 11 oz) IBW/kg (Calculated) : 73 Heparin Dosing Weight: actual body weight  Vital Signs: Temp: 98.1 F (36.7 C) (12/06 0028) Temp Source: Oral (12/06 0028) BP: 124/96 (12/06 0100) Pulse Rate: 72 (12/06 0100)  Labs: Recent Labs    08/03/23 0652 08/05/23 0341 08/05/23 0341 08/05/23 0522 08/05/23 1324 08/05/23 1417 08/06/23 0041  HGB  --  9.3*  --   --   --   --  8.3*  HCT  --  29.1*  --   --   --   --  26.3*  PLT  --  393  --   --   --   --  319  APTT  --  34  --   --   --   --   --   LABPROT  --  14.4  --   --   --   --   --   INR  --  1.1  --   --   --   --   --   HEPARINUNFRC  --   --   --   --   --  <0.10* 0.13*  CREATININE 1.91* 1.36*  --   --   --   --  1.27*  TROPONINIHS  --  1,081*   < > 1,018* 794* 798*  --    < > = values in this interval not displayed.    Estimated Creatinine Clearance: 37.3 mL/min (A) (by C-G formula based on SCr of 1.27 mg/dL (H)).  Medications:  No oral anticoagulation PTA  Assessment: 87 yo male with shortness of breath and altered mental status.  Recent discharge on 12/3 for pneumonia.  Troponin = 1081  08/06/2023: Heparin level 0.13- subtherapeutic but improving after rate increase. CBC- Hg 8.3: low & trending down , pltc 319: WNL Per RN, heparin is infusing without issues & no bleeding noted.  Goal of Therapy:  Heparin level 0.3-0.7 units/ml Monitor platelets by anticoagulation protocol: Yes   Plan:  Heparin 2000 unit IV bolus x 1 Increase Heparin gtt to 1150 units/hr Check heparin level 8 hr after bolus & rate increase Daily heparin level & CBC Noted plans for 48h for medical mgmt of NSTEMI (started 8a on 12/5)  Junita Push, PharmD, BCPS 08/06/2023 2:09 AM

## 2023-08-06 NOTE — TOC Initial Note (Signed)
Transition of Care [facility]) - Initial/Assessment Note    Patient Details  Name: Harry Norris MRN: [id number] Date of Birth: 04/30/1936  [facility] CM/SW Contact:    Adrian Prows, RN Phone Number: 08/06/2023, 2:25 PM  Clinical Narrative:                 TOC for d/c planning; pt disoriented; called his son Harlo Hammers [phone]); he says pt is lives at home; family is not sure of d/c plan, but anticipate pt will d/c to SNF; Mr Strube verified pt has insurance/PCP; he denies pt experiencing SDOH risk; pt has glasses, dentures(upper/lower), and HAs bilaterally; pt has waling sticks. Rolator, walker, and shower chair; Mr Schwindt says pt has HHPT w/ [facility]; he also has home oxygen that is used prn w/ [employer]; notified Cory at [place] and Eusebio Me at [place] of pt's location; awaiting PT/OT evals; TOC will follow.  Expected Discharge Plan: Skilled Nursing Facility Barriers to Discharge: Continued Medical Work up   Patient Goals and CMS Choice Patient states their goals for this hospitalization and ongoing recovery are:: per pt's son Arvind Seay plan for pt to d/c to SNF CMS Medicare.gov Compare Post Acute Care list provided to:: Patient Represenative (must comment) Lance Coon (son))        Expected Discharge Plan and Services   Discharge Planning Services: CM Consult   Living arrangements for the past 2 months: Single Family Home                                      Prior Living Arrangements/Services Living arrangements for the past 2 months: Single Family Home Lives with:: Self Patient language and need for interpreter reviewed:: Yes Do you feel safe going back to the place where you live?: Yes      Need for Family Participation in Patient Care: Yes (Comment) Care giver support system in place?: Yes (comment) Current home services: DME, Home PT (walking sticks, Rolator, walker, shower chair; HHPT w/ [facility]; home oxygen w/  [employer]) Criminal Activity/Legal Involvement Pertinent to Current Situation/Hospitalization: No - Comment as needed  Activities of Daily Living   ADL Screening (condition at time of admission) Independently performs ADLs?: Yes (appropriate for developmental age) Is the patient deaf or have difficulty hearing?: No Does the patient have difficulty seeing, even when wearing glasses/contacts?: No Does the patient have difficulty concentrating, remembering, or making decisions?: Yes  Permission Sought/Granted Permission sought to share information with : Case Manager Permission granted to share information with : Yes, Verbal Permission Granted  Share Information with NAME: Case Manager     Permission granted to share info w Relationship: Auryn Pacek (son) [phone]     Emotional Assessment Appearance:: Appears stated age Attitude/Demeanor/Rapport: Unable to Assess Affect (typically observed): Unable to Assess Orientation: : Oriented to Self, Oriented to Place Alcohol / Substance Use: Not Applicable Psych Involvement: No (comment)  Admission diagnosis:  Elevated troponin [R79.89] NSTEMI (non-ST elevated myocardial infarction) (HCC) [I21.4] Aspiration pneumonia of both lungs, unspecified aspiration pneumonia type, unspecified part of lung (HCC) [J69.0] Patient Active Problem List   Diagnosis Date Noted   Elevated troponin level not due myocardial infarction 08/06/2023   Demand ischemia (HCC) 08/06/2023   Acute on chronic hypoxic respiratory failure (HCC) 08/06/2023   Chronic HFrEF (heart failure with reduced ejection fraction) (HCC) 08/06/2023   Diabetes mellitus type 2, noninsulin dependent (HCC) 08/06/2023  NSTEMI (non-ST elevated myocardial infarction) (HCC) 08/05/2023   Nosocomial pneumonia 07/31/2023   AMS (altered mental status) 07/15/2023   Acute metabolic encephalopathy 07/15/2023   Hypokalemia 07/15/2023   History of COPD 07/15/2023   Chronic hypoxic respiratory  failure (HCC) 07/15/2023   Acute on chronic combined systolic and diastolic CHF (congestive heart failure) (HCC) 07/15/2023   Aspiration pneumonia (HCC) 07/31/2022   Right upper lobe pulmonary nodule 07/31/2022   AKI (acute kidney injury) (HCC) 07/31/2022   Essential hypertension 03/19/2022   Anemia of chronic disease 03/18/2022   Benign neoplasm of colon 03/18/2022   Bronchitis 03/18/2022   Cancer (HCC) 03/18/2022   Hypertension 03/18/2022   Kidney stone 03/18/2022   Diarrhea    Diverticulitis    Blood in the stool    Renal insufficiency 09/26/2021   Pressure injury of skin 07/30/2021   Respiratory failure (HCC) 07/29/2021   Influenza 07/29/2021   Acute hypoxemic respiratory failure (HCC) 07/12/2021   CKD stage 3b, GFR 30-44 ml/min (HCC) 07/12/2021   Community acquired pneumonia of right lower lobe of lung 07/12/2021   Sepsis (HCC) 07/12/2021   Chronic obstructive pulmonary disease with acute exacerbation (HCC) 07/12/2021   Bradycardia 12/19/2018   Orthostatic hypotension 12/19/2018   Fatigue 12/12/2018   Hyperlipidemia 02/07/2018   Asthma 02/07/2018   Chronic rhinitis 02/26/2016   Cardiomyopathy (HCC) 12/18/2015   Chronic venous insufficiency 12/18/2015   LBBB (left bundle branch block) 12/18/2015   Syncope 12/18/2015   Hypoxemia 04/10/2014   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   Hypertensive heart disease    Prostate cancer (HCC)    AAA (abdominal aortic aneurysm) (HCC)    Carotid artery occlusion    DM2 (diabetes mellitus, type 2) (HCC)    Leg swelling 11/28/2013   Other symptoms involving cardiovascular system 11/28/2013   Varicose veins of bilateral lower extremities with other complications 11/28/2013   PCP:  Paulina Fusi, MD Pharmacy:   [id number] - [place], [place] - [address] AT [address] [place] [address] Phone: [phone] Fax: [phone]  [place] [facility] [address] [place] [address] Phone: [phone] Fax: [phone]  [facility] [address] [place] [address] Phone: [phone] Fax: [phone]     Social Determinants of Health (SDOH) Social History: SDOH Screenings   Food Insecurity: No Food Insecurity (08/06/2023)  Housing: Low Risk  (08/06/2023)  Transportation Needs: No Transportation Needs (08/06/2023)  Utilities: Not At Risk (08/06/2023)  Depression (PHQ2-9): Low Risk  (08/07/2021)  Tobacco Use: Medium Risk (08/05/2023)   SDOH Interventions: Food Insecurity Interventions: Intervention Not Indicated, Inpatient TOC Housing Interventions: Intervention Not Indicated, Inpatient TOC Transportation Interventions: Intervention Not Indicated, Inpatient TOC Utilities Interventions: Intervention Not Indicated, Inpatient TOC   Readmission Risk Interventions    08/06/2023    2:21 PM 08/02/2022    2:14 PM 09/29/2021    5:07 PM  Readmission Risk Prevention Plan  Transportation Screening Complete Complete Complete  PCP or Specialist Appt within 3-5 Days  Complete   HRI or Home Care Consult  Complete Not Complete  HRI or Home Care Consult comments   Pending son's decision about 24 hr homecare v/s ALF  Social Work Consult for Recovery Care Planning/Counseling  Complete Not Complete  SW consult not completed comments   Pending son's decision about 24 hr homecare v/s ALF  Palliative Care Screening  Not Applicable Not Applicable  Medication  Review Oceanographer) Complete Complete Complete  PCP or Specialist appointment within 3-5 days of discharge Complete    HRI or Home Care Consult Complete    SW Recovery Care/Counseling Consult Complete    Palliative Care Screening Not Applicable    Skilled Nursing Facility Not Applicable

## 2023-08-06 NOTE — Progress Notes (Signed)
PT Cancellation Note  Patient Details Name: Harry Norris MRN: [id number] DOB: 1935-09-22   Cancelled Treatment:     PT order received but eval deferred - pt for thoracentesis.  Will follow.   Math Brazie 08/06/2023, 1:07 PM

## 2023-08-06 NOTE — Progress Notes (Addendum)
[facility] PROGRESS NOTE    Progress Note  KINGSTYN HOLAWAY  ZOX:[id number] DOB: 03-Feb-1936 DOA: 08/05/2023 PCP: Paulina Fusi, MD     Brief Narrative:   PRANISH PINKS is an 87 y.o. male past medical history significant for COPD on oxygen as needed, diabetes mellitus type 2 with a hemoglobin A1c of 5.8 in 2024, essential hypertension, chronic combined systolic and diastolic heart failure, chronic kidney see stage IIIb, discharged from: On 08/03/2023 for community-acquired pneumonia and acute kidney injury, who was brought in to the hospital for lower back pain and abdominal pain that started the day prior to admission EMS was called he started complaining of shortness of breath found hypoxic 78% on 2 L.  Was placed on 4 L, troponins in the thousand in the ED was started on IV heparin drip  Assessment/Plan:   Elevated troponin I: Twelve-lead EKG showed no acute ST segment changes, cardiac biomarkers were thousand and trending down, he has no chest pain. In the setting of CKD IIIb. Cardiology was consulted who recommended heparin for now at this this seems to be due to demand ischemia. Started on statin and aspirin.  Continue IV heparin can stop after 24 hours. No events on telemetry.  Acute on chronic respiratory failure with hypoxia possibly due to right-sided pleural effusion: CT angio of the chest was negative for PE but did show low lung volumes bilaterally with moderate right and small left pleural effusion, with evidence of bilateral infiltrates.  And improving pneumonia. Shows no pulmonary edema, BNP is 800. Question of the respiratory failure and back pain are likely due to right pleural effusion. Will ask IR to perform thoracocentesis He was started empirically on Rocephin and azithromycin for possible recurrence of his community-acquired pneumonia.  Will complete a 3-day course.  Acute hypoxic encephalopathy: Likely due to Hypoxia. Now resolved.  Possible dementia  without behavioral disturbance: At high risk for aspiration and agitation. Use melatonin at bedtime.  Chronic kidney disease stage IIIb: He was aggressively diuresed during last hospitalization.  His renal function appears to be at baseline. Baseline creatinine is around 1.3-1.6.  Chronic combined systolic and diastolic heart failure: Was continuing oral Lasix.  Hyperlipidemia: Continue Lipitor and fenofibrate.  Controlled diabetes mellitus type 2: With a last A1c of less than 6. Continue sliding scale insulin.  Urinary retention per nursing staff: He has now able to urinate since this morning Foley catheter was placed, will try a voiding trial.  Deconditioning: Patient refused skilled nursing facility in previous admission. Consult PT OT.   DVT prophylaxis: heparin Family Communication:none Status is: Inpatient Remains inpatient appropriate because: acute resp.Failure with hypoxia    Code Status:     Code Status Orders  (From admission, onward)           Start     Ordered   08/05/23 0844  Do not attempt resuscitation (DNR)- Limited -Do Not Intubate (DNI)  Continuous       Question Answer Comment  If pulseless and not breathing No CPR or chest compressions.   In Pre-Arrest Conditions (Patient Is Breathing and Has A Pulse) Do not intubate. Provide all appropriate non-invasive medical interventions. Avoid ICU transfer unless indicated or required.   Consent: Discussion documented in EHR or advanced directives reviewed      08/05/23 0844           Code Status History     Date Active Date Inactive Code Status Order ID Comments User Context   08/01/2023  1056 08/03/2023 2334 Limited: Do not attempt resuscitation (DNR) -DNR-LIMITED -Do Not Intubate/DNI  [id number]  Burnadette Pop, MD Inpatient   07/15/2023 [phone] 07/16/2023 2146 Do not attempt resuscitation (DNR) PRE-ARREST INTERVENTIONS DESIRED [id number]  Dorcas Carrow, MD Inpatient   07/15/2023 0212 07/15/2023  0951 Full Code [id number]  Angie Fava, DO Inpatient   07/31/2022 2208 08/02/2022 2037 Full Code [id number]  Anselm Jungling, DO Inpatient   09/26/2021 1746 09/30/2021 0313 DNR [id number]  Cora Collum, DO ED   07/29/2021 1138 08/03/2021 2234 DNR [id number]  Lorin Glass, MD ED      Advance Directive Documentation    Flowsheet Row Most Recent Value  Type of Advance Directive Healthcare Power of Attorney  Pre-existing out of facility DNR order (yellow form or pink MOST form) --  "MOST" Form in Place? --         IV Access:   Peripheral IV   Procedures and diagnostic studies:   CT Angio Chest PE W and/or Wo Contrast  Result Date: 08/05/2023 CLINICAL DATA:  87 year old male with altered mental status, delirium, recently hospitalized for pneumonia. EXAM: CT ANGIOGRAPHY CHEST WITH CONTRAST TECHNIQUE: Multidetector CT imaging of the chest was performed using the standard protocol during bolus administration of intravenous contrast. Multiplanar CT image reconstructions and MIPs were obtained to evaluate the vascular anatomy. RADIATION DOSE REDUCTION: This exam was performed according to the departmental dose-optimization program which includes automated exposure control, adjustment of the mA and/or kV according to patient size and/or use of iterative reconstruction technique. CONTRAST:  75mL OMNIPAQUE IOHEXOL 350 MG/ML SOLN COMPARISON:  Portable chest this morning.  CTA chest 07/31/2022. FINDINGS: Cardiovascular: Excellent contrast bolus timing in the pulmonary arterial tree. Mild respiratory motion. No pulmonary artery filling defect. Calcified aortic atherosclerosis. Calcified coronary artery atherosclerosis. Stable heart size, mild cardiomegaly. No pericardial effusion. Little contrast in the aorta. Mediastinum/Nodes: Stable, negative. Lungs/Pleura: Lower lung volumes. Atelectatic changes to the major airways which remain patent. Layering pleural effusions, up to moderate on the right and  small on the left. Superimposed bilateral lower lobe atelectasis versus consolidation (some enhancement of the affected parenchyma on the right). Subtle superimposed sub solid peribronchial nodular opacity in the anterior basal segment of the right lower lobe and throughout the right middle lobe (series 12, image 86). No other convincing pulmonary inflammation. Some underlying chronic lung changes. Upper Abdomen: Negative visible liver, gallbladder, spleen, pancreas, adrenal glands and stomach. Diverticulosis of the visible colon. Partially visible renal multi cystic disease on the right. Musculoskeletal: Widespread thoracic spinal ankylosis appears related to Diffuse idiopathic skeletal hyperostosis (DISH). No acute or suspicious osseous lesion identified. Review of the MIP images confirms the above findings. IMPRESSION: 1. Negative for acute pulmonary embolus. 2. Lower lung volumes with bilateral up to moderate right and small left layering pleural effusions. Bilateral lower lobe atelectasis versus Pneumonia. And there is evidence of mild Bronchopneumonia in the right middle lobe. 3.  Aortic Atherosclerosis (ICD10-I70.0). Electronically Signed   By: Odessa Fleming M.D.   On: 08/05/2023 06:36   CT Head Wo Contrast  Result Date: 08/05/2023 CLINICAL DATA:  87 year old male with altered mental status, delirium, recently hospitalized for pneumonia. EXAM: CT HEAD WITHOUT CONTRAST TECHNIQUE: Contiguous axial images were obtained from the base of the skull through the vertex without intravenous contrast. RADIATION DOSE REDUCTION: This exam was performed according to the departmental dose-optimization program which includes automated exposure control, adjustment of the mA and/or kV according to patient size and/or use of iterative reconstruction  technique. COMPARISON:  Head CT 07/14/2023. FINDINGS: Brain: Stable cerebral volume. Cavum septum pellucidum, normal variant. Chronic lacunar infarct right caudate. No midline shift,  ventriculomegaly, mass effect, evidence of mass lesion, intracranial hemorrhage or evidence of cortically based acute infarction. Outside of the right caudate gray-white differentiation appears normal for age. Vascular: No suspicious intracranial vascular hyperdensity. Calcified atherosclerosis at the skull base. Skull: Intact.  No acute osseous abnormality identified. Sinuses/Orbits: Unresolved right middle ear and right greater than left mastoid opacification. Negative visible nasopharynx. Left tympanic cavity remains pneumatized. But these findings are chronic and not progressed from January of 2023. Minimal paranasal sinus mucosal thickening, stable to improved. Other: No acute orbit or scalp soft tissue finding. IMPRESSION: 1. No acute intracranial abnormality. 2. Chronic lacunar infarct right caudate. 3. Chronic likely Postinflammatory opacification of the right middle ear, mastoids. Electronically Signed   By: Odessa Fleming M.D.   On: 08/05/2023 06:31   DG Chest Portable 1 View  Result Date: 08/05/2023 CLINICAL DATA:  87 year old male with increasing shortness of breath. Recent pneumonia. EXAM: PORTABLE CHEST 1 VIEW COMPARISON:  Portable chest 07/31/2023 and earlier. FINDINGS: Portable AP semi upright view at 0327 hours. Stable lung volumes and mediastinal contours. Calcified aortic atherosclerosis. Increased veiling opacity at both lung bases now, with less pronounced but unresolved confluent right lung base peribronchial opacity since 07/31/2023. No pneumothorax. No pulmonary edema. Stable visualized osseous structures. IMPRESSION: Evidence of new or increased bilateral pleural effusions superimposed on regressed but not resolved right lung base pneumonia. PA and lateral views of the chest may be valuable when feasible. Electronically Signed   By: Odessa Fleming M.D.   On: 08/05/2023 04:41     Medical Consultants:   None.   Subjective:    Johnathin R Hynson no chest pain or SOB  Objective:    Vitals:    08/06/23 0100 08/06/23 0337 08/06/23 0400 08/06/23 0500  BP: (!) 124/96  (!) 134/35 (!) 137/37  Pulse: 72  79 74  Resp: 20  (!) 27 19  Temp:  98.3 F (36.8 C)    TempSrc:  Oral    SpO2: 98%  99% 98%  Weight:      Height:       SpO2: 98 % O2 Flow Rate (L/min): 2 L/min   Intake/Output Summary (Last 24 hours) at 08/06/2023 0713 Last data filed at 08/06/2023 0600 Gross per 24 hour  Intake 957.23 ml  Output 800 ml  Net 157.23 ml   Filed Weights   08/05/23 1500  Weight: 62 kg    Exam: General exam: In no acute distress. Respiratory system: Good air movement and crackles at bases Cardiovascular system: S1 & S2 heard, RRR. No JVD. Gastrointestinal system: Abdomen is nondistended, soft and nontender.  Extremities: No pedal edema. Skin: No rashes, lesions or ulcers Psychiatry: No Judgement or insight on medical condition.    Data Reviewed:    Labs: Basic Metabolic Panel: Recent Labs  Lab 07/31/23 2335 08/01/23 0826 08/02/23 0831 08/03/23 0652 08/05/23 0341 08/06/23 0041  NA  --  133* 135 133* 135 136  K  --  3.8 3.4* 4.1 3.8 4.1  CL  --  101 100 97* 97* 98  CO2  --  22 27 27 30 28   GLUCOSE  --  96 140* 94 105* 92  BUN  --  17 22 31* 40* 32*  CREATININE  --  1.42* 1.55* 1.91* 1.36* 1.27*  CALCIUM  --  8.6* 8.5* 8.5* 8.8* 8.5*  MG 1.5* 1.9  --   --  1.9  --    GFR Estimated Creatinine Clearance: 37.3 mL/min (A) (by C-G formula based on SCr of 1.27 mg/dL (H)). Liver Function Tests: Recent Labs  Lab 08/05/23 0341  AST 22  ALT 18  ALKPHOS 44  BILITOT 0.8  PROT 6.4*  ALBUMIN 2.6*   No results for input(s): "LIPASE", "AMYLASE" in the last 168 hours. No results for input(s): "AMMONIA" in the last 168 hours. Coagulation profile Recent Labs  Lab 08/05/23 0341  INR 1.1   COVID-19 Labs  No results for input(s): "DDIMER", "FERRITIN", "LDH", "CRP" in the last 72 hours.  Lab Results  Component Value Date   SARSCOV2NAA NEGATIVE 08/05/2023   SARSCOV2NAA  NEGATIVE 07/31/2023   SARSCOV2NAA NEGATIVE 07/14/2023   SARSCOV2NAA NEGATIVE 07/31/2022    CBC: Recent Labs  Lab 07/31/23 1739 07/31/23 1754 08/01/23 0826 08/02/23 0831 08/05/23 0341 08/06/23 0041  WBC 14.1*  --  16.6* 10.2 5.5 6.0  NEUTROABS 12.9*  --   --   --  3.5  --   HGB 10.0* 11.2* 8.9* 9.4* 9.3* 8.3*  HCT 31.6* 33.0* 28.4* 29.1* 29.1* 26.3*  MCV 91.6  --  91.0 90.9 91.2 93.6  PLT 434*  --  367 391 393 319   Cardiac Enzymes: No results for input(s): "CKTOTAL", "CKMB", "CKMBINDEX", "TROPONINI" in the last 168 hours. BNP (last 3 results) No results for input(s): "PROBNP" in the last 8760 hours. CBG: No results for input(s): "GLUCAP" in the last 168 hours. D-Dimer: No results for input(s): "DDIMER" in the last 72 hours. Hgb A1c: No results for input(s): "HGBA1C" in the last 72 hours. Lipid Profile: No results for input(s): "CHOL", "HDL", "LDLCALC", "TRIG", "CHOLHDL", "LDLDIRECT" in the last 72 hours. Thyroid function studies: No results for input(s): "TSH", "T4TOTAL", "T3FREE", "THYROIDAB" in the last 72 hours.  Invalid input(s): "FREET3" Anemia work up: No results for input(s): "VITAMINB12", "FOLATE", "FERRITIN", "TIBC", "IRON", "RETICCTPCT" in the last 72 hours. Sepsis Labs: Recent Labs  Lab 07/31/23 1739 07/31/23 2335 08/01/23 0826 08/02/23 0831 08/05/23 0341 08/05/23 0522 08/06/23 0041  PROCALCITON  --  0.89  --   --   --   --   --   WBC 14.1*  --  16.6* 10.2 5.5  --  6.0  LATICACIDVEN 1.1  --   --   --  1.1 0.9  --    Microbiology Recent Results (from the past 240 hour(s))  Culture, blood (routine x 2)     Status: None (Preliminary result)   Collection Time: 07/31/23  5:39 PM   Specimen: BLOOD  Result Value Ref Range Status   Specimen Description   Final    BLOOD LEFT ANTECUBITAL Performed at [facility], [address] [place], [place] [address]    Special Requests   Final    BOTTLES DRAWN AEROBIC AND ANAEROBIC Blood Culture  results may not be optimal due to an inadequate volume of blood received in culture bottles Performed at [facility], [address] [place], [place] [address]    Culture   Final    NO GROWTH 4 DAYS Performed at [facility], [address], [place], [place] [address]    Report Status PENDING  Incomplete  Culture, blood (routine x 2)     Status: None (Preliminary result)   Collection Time: 07/31/23  5:45 PM   Specimen: BLOOD  Result Value Ref Range Status   Specimen  Description   Final    BLOOD BLOOD RIGHT WRIST Performed at [facility], [address] [place], [place] [address]    Special Requests   Final    BOTTLES DRAWN AEROBIC AND ANAEROBIC Blood Culture adequate volume Performed at [facility], [address] [place], [place] [address]    Culture   Final    NO GROWTH 4 DAYS Performed at [facility], [address], [place], [place] [address]    Report Status PENDING  Incomplete  Resp panel by RT-PCR (RSV, Flu A&B, Covid) Anterior Nasal Swab     Status: None   Collection Time: 07/31/23  5:50 PM   Specimen: Anterior Nasal Swab  Result Value Ref Range Status   SARS Coronavirus 2 by RT PCR NEGATIVE NEGATIVE Final    Comment: (NOTE) SARS-CoV-2 target nucleic acids are NOT DETECTED.  The SARS-CoV-2 RNA is generally detectable in upper respiratory specimens during the acute phase of infection. The lowest concentration of SARS-CoV-2 viral copies this assay can detect is 138 copies/mL. A negative result does not preclude SARS-Cov-2 infection and should not be used as the sole basis for treatment or other patient management decisions. A negative result may occur with  improper specimen collection/handling, submission of specimen other than nasopharyngeal swab, presence of viral mutation(s) within the areas targeted by this assay, and inadequate number of viral copies(<138 copies/mL). A negative result must be combined  with clinical observations, patient history, and epidemiological information. The expected result is Negative.  Fact Sheet for Patients:  BloggerCourse.com  Fact Sheet for Healthcare Providers:  SeriousBroker.it  This test is no t yet approved or cleared by the [place] FDA and  has been authorized for detection and/or diagnosis of SARS-CoV-2 by FDA under an Emergency Use Authorization (EUA). This EUA will remain  in effect (meaning this test can be used) for the duration of the COVID-19 declaration under Section 564(b)(1) of the Act, 21 U.S.C.section 360bbb-3(b)(1), unless the authorization is terminated  or revoked sooner.       Influenza A by PCR NEGATIVE NEGATIVE Final   Influenza B by PCR NEGATIVE NEGATIVE Final    Comment: (NOTE) The Xpert Xpress SARS-CoV-2/FLU/RSV plus assay is intended as an aid in the diagnosis of influenza from Nasopharyngeal swab specimens and should not be used as a sole basis for treatment. Nasal washings and aspirates are unacceptable for Xpert Xpress SARS-CoV-2/FLU/RSV testing.  Fact Sheet for Patients: BloggerCourse.com  Fact Sheet for Healthcare Providers: SeriousBroker.it  This test is not yet approved or cleared by the [place] FDA and has been authorized for detection and/or diagnosis of SARS-CoV-2 by FDA under an Emergency Use Authorization (EUA). This EUA will remain in effect (meaning this test can be used) for the duration of the COVID-19 declaration under Section 564(b)(1) of the Act, 21 U.S.C. section 360bbb-3(b)(1), unless the authorization is terminated or revoked.     Resp Syncytial Virus by PCR NEGATIVE NEGATIVE Final    Comment: (NOTE) Fact Sheet for Patients: BloggerCourse.com  Fact Sheet for Healthcare Providers: SeriousBroker.it  This test is not yet approved  or cleared by the [place] FDA and has been authorized for detection and/or diagnosis of SARS-CoV-2 by FDA under an Emergency Use Authorization (EUA). This EUA will remain in effect (meaning this test can be used) for the duration of the COVID-19 declaration under Section 564(b)(1) of the Act, 21 U.S.C. section 360bbb-3(b)(1), unless the authorization is terminated or revoked.  Performed at [facility], [address] [place], [place] [address]   MRSA Next Gen by PCR, Nasal     Status: None   Collection Time: 07/31/23  9:41 PM   Specimen: Nasal Mucosa; Nasal Swab  Result Value Ref Range Status   MRSA by PCR Next Gen NOT DETECTED NOT DETECTED Final    Comment: (NOTE) The GeneXpert MRSA Assay (FDA approved for NASAL specimens only), is one component of a comprehensive MRSA colonization surveillance program. It is not intended to diagnose MRSA infection nor to guide or monitor treatment for MRSA infections. Test performance is not FDA approved in patients less than 61 years old. Performed at [facility], [address] [place], [place] [address]   Resp panel by RT-PCR (RSV, Flu A&B, Covid) Anterior Nasal Swab     Status: None   Collection Time: 08/05/23  3:20 AM   Specimen: Anterior Nasal Swab  Result Value Ref Range Status   SARS Coronavirus 2 by RT PCR NEGATIVE NEGATIVE Final    Comment: (NOTE) SARS-CoV-2 target nucleic acids are NOT DETECTED.  The SARS-CoV-2 RNA is generally detectable in upper respiratory specimens during the acute phase of infection. The lowest concentration of SARS-CoV-2 viral copies this assay can detect is 138 copies/mL. A negative result does not preclude SARS-Cov-2 infection and should not be used as the sole basis for treatment or other patient management decisions. A negative result may occur with  improper specimen collection/handling, submission of specimen other than nasopharyngeal swab, presence of viral mutation(s)  within the areas targeted by this assay, and inadequate number of viral copies(<138 copies/mL). A negative result must be combined with clinical observations, patient history, and epidemiological information. The expected result is Negative.  Fact Sheet for Patients:  BloggerCourse.com  Fact Sheet for Healthcare Providers:  SeriousBroker.it  This test is no t yet approved or cleared by the [place] FDA and  has been authorized for detection and/or diagnosis of SARS-CoV-2 by FDA under an Emergency Use Authorization (EUA). This EUA will remain  in effect (meaning this test can be used) for the duration of the COVID-19 declaration under Section 564(b)(1) of the Act, 21 U.S.C.section 360bbb-3(b)(1), unless the authorization is terminated  or revoked sooner.       Influenza A by PCR NEGATIVE NEGATIVE Final   Influenza B by PCR NEGATIVE NEGATIVE Final    Comment: (NOTE) The Xpert Xpress SARS-CoV-2/FLU/RSV plus assay is intended as an aid in the diagnosis of influenza from Nasopharyngeal swab specimens and should not be used as a sole basis for treatment. Nasal washings and aspirates are unacceptable for Xpert Xpress SARS-CoV-2/FLU/RSV testing.  Fact Sheet for Patients: BloggerCourse.com  Fact Sheet for Healthcare Providers: SeriousBroker.it  This test is not yet approved or cleared by the [place] FDA and has been authorized for detection and/or diagnosis of SARS-CoV-2 by FDA under an Emergency Use Authorization (EUA). This EUA will remain in effect (meaning this test can be used) for the duration of the COVID-19 declaration under Section 564(b)(1) of the Act, 21 U.S.C. section 360bbb-3(b)(1), unless the authorization is terminated or revoked.     Resp Syncytial Virus by PCR NEGATIVE NEGATIVE Final    Comment: (NOTE) Fact Sheet for  Patients: BloggerCourse.com  Fact Sheet for Healthcare Providers: SeriousBroker.it  This test is not yet approved or cleared by the [place] FDA and has been authorized for detection and/or diagnosis of SARS-CoV-2 by FDA under an Emergency Use Authorization (EUA).  This EUA will remain in effect (meaning this test can be used) for the duration of the COVID-19 declaration under Section 564(b)(1) of the Act, 21 U.S.C. section 360bbb-3(b)(1), unless the authorization is terminated or revoked.  Performed at [facility], [address], [place], [place] [address]   MRSA Next Gen by PCR, Nasal     Status: None   Collection Time: 08/05/23  2:35 PM   Specimen: Nasal Mucosa; Nasal Swab  Result Value Ref Range Status   MRSA by PCR Next Gen NOT DETECTED NOT DETECTED Final    Comment: (NOTE) The GeneXpert MRSA Assay (FDA approved for NASAL specimens only), is one component of a comprehensive MRSA colonization surveillance program. It is not intended to diagnose MRSA infection nor to guide or monitor treatment for MRSA infections. Test performance is not FDA approved in patients less than 71 years old. Performed at [facility], [address] [place], [place] [address]      Medications:    aspirin EC  81 mg Oral Daily   atorvastatin  40 mg Oral QPM   Chlorhexidine Gluconate Cloth  6 each Topical Daily   feeding supplement  237 mL Oral BID BM   fenofibrate  160 mg Oral Daily   furosemide  20 mg Oral Daily   pantoprazole  40 mg Oral BID   Continuous Infusions:  azithromycin Stopped (08/05/23 1120)   cefTRIAXone (ROCEPHIN)  IV Stopped (08/05/23 1051)   heparin 1,150 Units/hr (08/06/23 0981)      LOS: 1 day   Marinda Elk  [facility]  08/06/2023, 7:13 AM

## 2023-08-06 NOTE — Progress Notes (Addendum)
0730 patient alert to place and self only states he's in pain then denies having pain.  5784 Heparin stopped for thoracentesis today 2L [facility] taken off patient 100% on oxygen 95 room air incentive spirometry given to patient with teaching, urine sent per orders. Patient not eating well few bites only.  1140 patient going to IR ok to go with out RN my MD.  1240 patient back from IR  1255 patient transferred to chair from bed using wc min assist needed  1315 patient ambulated to bathroom then back to chair for lunch  1430 patient placed back in bed poor appetite didn't eat much for lunch, patient doesn't want to have ensure states he doesn't like none of the flavors.  1700 family present wants diet change so patient will eat diet explained and family educated on patients heart and diet.

## 2023-08-06 NOTE — Procedures (Signed)
Ultrasound-guided diagnostic and therapeutic right thoracentesis performed yielding 420 cc of hazy,amber fluid. No immediate complications. Follow-up chest x-ray pending. The fluid was sent to the lab for preordered studies. EBL< 2 cc.

## 2023-08-06 NOTE — Plan of Care (Signed)
  Problem: Education: Goal: Knowledge of General Education information will improve Description: Including pain rating scale, medication(s)/side effects and non-pharmacologic comfort measures Outcome: Progressing   Problem: Clinical Measurements: Goal: Ability to maintain clinical measurements within normal limits will improve Outcome: Progressing Goal: Cardiovascular complication will be avoided Outcome: Progressing   

## 2023-08-06 NOTE — Progress Notes (Signed)
PHARMACY - ANTICOAGULATION CONSULT NOTE  Pharmacy Consult for Heparin Indication: chest pain/ACS  Allergies  Allergen Reactions   Ace Inhibitors Cough    Patient Measurements: Height: 5\' 10"  (177.8 cm) Weight: 62 kg (136 lb 11 oz) IBW/kg (Calculated) : 73 Heparin Dosing Weight: actual body weight  Vital Signs: Temp: 97.4 F (36.3 C) (12/06 2018) Temp Source: Oral (12/06 2018) BP: 122/52 (12/06 1900) Pulse Rate: 96 (12/06 1900)  Labs: Recent Labs    08/05/23 0341 08/05/23 0522 08/05/23 1324 08/05/23 1417 08/05/23 1417 08/06/23 0041 08/06/23 1111 08/06/23 2147  HGB 9.3*  --   --   --   --  8.3*  --   --   HCT 29.1*  --   --   --   --  26.3*  --   --   PLT 393  --   --   --   --  319  --   --   APTT 34  --   --   --   --   --   --   --   LABPROT 14.4  --   --   --   --   --   --   --   INR 1.1  --   --   --   --   --   --   --   HEPARINUNFRC  --   --   --  <0.10*   < > 0.13* <0.10* 0.22*  CREATININE 1.36*  --   --   --   --  1.27*  --   --   CKTOTAL  --   --   --   --   --  20*  --   --   TROPONINIHS 1,081* 1,018* 794* 798*  --   --   --   --    < > = values in this interval not displayed.    Estimated Creatinine Clearance: 37.3 mL/min (A) (by C-G formula based on SCr of 1.27 mg/dL (H)).  Medications:  No oral anticoagulation PTA  Assessment: 87 yo male with shortness of breath and altered mental status.  Recent discharge on 12/3 for pneumonia.  Troponin = 1081  Significant Events: - 12/6: s/p thoracentesis Heparin held 0900-1400    08/06/2023: Heparin level 0.22- remains sub-therapeutic on 1150 units/hr CBC: Hgb 8.3--low & trending down, pltc 319--WNL No bleeding or infusion related concerns reported by RN  Goal of Therapy:  Heparin level 0.3-0.7 units/ml Monitor platelets by anticoagulation protocol: Yes   Plan:  Heparin 1000 unit IV bolus x 1  Increase Heparin gtt to 1300 units/hr Check heparin level 8hrs after rate increase Daily heparin level &  CBC Noted plans for 48h for medical mgmt of NSTEMI (started 8a on 12/5)  Junita Push, PharmD, BCPS 12/6/202411:00 PM

## 2023-08-06 NOTE — Progress Notes (Signed)
PHARMACY - ANTICOAGULATION CONSULT NOTE  Pharmacy Consult for Heparin Indication: chest pain/ACS  Allergies  Allergen Reactions   Ace Inhibitors Cough    Patient Measurements: Height: 5\' 10"  (177.8 cm) Weight: 62 kg (136 lb 11 oz) IBW/kg (Calculated) : 73 Heparin Dosing Weight: actual body weight  Vital Signs: Temp: 98 F (36.7 C) (12/06 0800) Temp Source: Oral (12/06 0800) BP: 120/32 (12/06 1100) Pulse Rate: 75 (12/06 1100)  Labs: Recent Labs    08/05/23 0341 08/05/23 0522 08/05/23 1324 08/05/23 1417 08/06/23 0041 08/06/23 1111  HGB 9.3*  --   --   --  8.3*  --   HCT 29.1*  --   --   --  26.3*  --   PLT 393  --   --   --  319  --   APTT 34  --   --   --   --   --   LABPROT 14.4  --   --   --   --   --   INR 1.1  --   --   --   --   --   HEPARINUNFRC  --   --   --  <0.10* 0.13* <0.10*  CREATININE 1.36*  --   --   --  1.27*  --   CKTOTAL  --   --   --   --  20*  --   TROPONINIHS 1,081* 1,018* 794* 798*  --   --     Estimated Creatinine Clearance: 37.3 mL/min (A) (by C-G formula based on SCr of 1.27 mg/dL (H)).  Medications:  No oral anticoagulation PTA  Assessment: 87 yo male with shortness of breath and altered mental status.  Recent discharge on 12/3 for pneumonia.  Troponin = 1081  08/06/2023: 1100 heparin level undetectable as heparin was turned off 0853 in preparation for thoracentesis this afternoon.  Given continued subtherapeutic heparin levels and heparin pause this morning, will plan to re-bolus the patient with heparin (will give a conservative bolus given recent thoracentesis).  CBC: Hgb 8.3--low & trending down, pltc 319--WNL Per IR, plan to restart heparin @1330   Goal of Therapy:  Heparin level 0.3-0.7 units/ml Monitor platelets by anticoagulation protocol: Yes   Plan:  Heparin 1000 unit IV bolus x 1  Continue Heparin gtt at 1150 units/hr Check heparin level 8hrs after heparin restart Daily heparin level & CBC Noted plans for 48h for  medical mgmt of NSTEMI (started 8a on 12/5)    Cherylin Mylar, PharmD Clinical Pharmacist  12/6/20241:02 PM

## 2023-08-06 NOTE — Progress Notes (Signed)
OT Cancellation Note  Patient Details Name: Harry Norris MRN: [id number] DOB: 16-Mar-1936   Cancelled Treatment:    Reason Eval/Treat Not Completed: Patient at procedure or test/ unavailable. Pt being transferred off the unit for a thoracentesis.   Reuben Likes, OTR/L 08/06/2023, 4:32 PM

## 2023-08-06 NOTE — Plan of Care (Signed)
  Problem: Education: Goal: Knowledge of General Education information will improve Description: Including pain rating scale, medication(s)/side effects and non-pharmacologic comfort measures Outcome: Not Progressing   Problem: Health Behavior/Discharge Planning: Goal: Ability to manage health-related needs will improve Outcome: Progressing   Problem: Clinical Measurements: Goal: Ability to maintain clinical measurements within normal limits will improve Outcome: Progressing Goal: Will remain free from infection Outcome: Progressing Goal: Diagnostic test results will improve Outcome: Progressing Goal: Respiratory complications will improve Outcome: Progressing Goal: Cardiovascular complication will be avoided Outcome: Progressing   Problem: Activity: Goal: Risk for activity intolerance will decrease Outcome: Progressing   Problem: Nutrition: Goal: Adequate nutrition will be maintained Outcome: Progressing   Problem: Coping: Goal: Level of anxiety will decrease Outcome: Progressing   Problem: Elimination: Goal: Will not experience complications related to bowel motility Outcome: Progressing Goal: Will not experience complications related to urinary retention Outcome: Progressing   Problem: Pain Management: Goal: General experience of comfort will improve Outcome: Progressing   Problem: Safety: Goal: Ability to remain free from injury will improve Outcome: Progressing   Problem: Skin Integrity: Goal: Risk for impaired skin integrity will decrease Outcome: Progressing   Problem: Nutrition Goal: Nutritional status is improving Description: Monitor and assess patient for malnutrition (ex- brittle hair, bruises, dry skin, pale skin and conjunctiva, muscle wasting, smooth red tongue, and disorientation). Collaborate with interdisciplinary team and initiate plan and interventions as ordered.  Monitor patient's weight and dietary intake as ordered or per policy. Utilize  nutrition screening tool and intervene per policy. Determine patient's food preferences and provide high-protein, high-caloric foods as appropriate.  Outcome: Not Progressing

## 2023-08-07 DIAGNOSIS — I5022 Chronic systolic (congestive) heart failure: Secondary | ICD-10-CM | POA: Diagnosis not present

## 2023-08-07 DIAGNOSIS — J690 Pneumonitis due to inhalation of food and vomit: Secondary | ICD-10-CM | POA: Diagnosis not present

## 2023-08-07 DIAGNOSIS — R7989 Other specified abnormal findings of blood chemistry: Secondary | ICD-10-CM | POA: Diagnosis not present

## 2023-08-07 DIAGNOSIS — E119 Type 2 diabetes mellitus without complications: Secondary | ICD-10-CM | POA: Diagnosis not present

## 2023-08-07 LAB — CBC
HCT: 28 % — ABNORMAL LOW (ref 39.0–52.0)
Hemoglobin: 8.7 g/dL — ABNORMAL LOW (ref 13.0–17.0)
MCH: 29.2 pg (ref 26.0–34.0)
MCHC: 31.1 g/dL (ref 30.0–36.0)
MCV: 94 fL (ref 80.0–100.0)
Platelets: 328 10*3/uL (ref 150–400)
RBC: 2.98 MIL/uL — ABNORMAL LOW (ref 4.22–5.81)
RDW: 16.8 % — ABNORMAL HIGH (ref 11.5–15.5)
WBC: 4.8 10*3/uL (ref 4.0–10.5)
nRBC: 0 % (ref 0.0–0.2)

## 2023-08-07 LAB — HEPARIN LEVEL (UNFRACTIONATED): Heparin Unfractionated: 0.39 [IU]/mL (ref 0.30–0.70)

## 2023-08-07 NOTE — Progress Notes (Addendum)
PHARMACY - ANTICOAGULATION CONSULT NOTE  Pharmacy Consult for Heparin Indication: chest pain/ACS  Allergies  Allergen Reactions   Ace Inhibitors Cough    Patient Measurements: Height: 5\' 10"  (177.8 cm) Weight: 62 kg (136 lb 11 oz) IBW/kg (Calculated) : 73 Heparin Dosing Weight: actual body weight  Vital Signs: Temp: 97.6 F (36.4 C) (12/07 0824) Temp Source: Oral (12/07 0824) BP: 113/41 (12/07 0900) Pulse Rate: 92 (12/07 0900)  Labs: Recent Labs    08/05/23 0341 08/05/23 0522 08/05/23 1324 08/05/23 1417 08/05/23 1417 08/06/23 0041 08/06/23 1111 08/06/23 2147 08/07/23 0255 08/07/23 0714  HGB 9.3*  --   --   --   --  8.3*  --   --  8.7*  --   HCT 29.1*  --   --   --   --  26.3*  --   --  28.0*  --   PLT 393  --   --   --   --  319  --   --  328  --   APTT 34  --   --   --   --   --   --   --   --   --   LABPROT 14.4  --   --   --   --   --   --   --   --   --   INR 1.1  --   --   --   --   --   --   --   --   --   HEPARINUNFRC  --   --   --  <0.10*   < > 0.13* <0.10* 0.22*  --  0.39  CREATININE 1.36*  --   --   --   --  1.27*  --   --   --   --   CKTOTAL  --   --   --   --   --  20*  --   --   --   --   TROPONINIHS 1,081* 1,018* 794* 798*  --   --   --   --   --   --    < > = values in this interval not displayed.    Estimated Creatinine Clearance: 37.3 mL/min (A) (by C-G formula based on SCr of 1.27 mg/dL (H)).  Medications:  No oral anticoagulation PTA  Assessment: 87 yo male with shortness of breath and altered mental status.  Recent discharge on 12/3 for pneumonia.  Troponin = 1081  Significant Events: - 12/6: s/p thoracentesis Heparin held 0900-1400    08/07/2023: Heparin level 0.39 - therapeutic on 1300 units/hr CBC: Hgb 8.7--low & stabilizing, pltc WNL No bleeding and night shift discovered the infusion has been stopped during the night for about an hour but turned back on reported by RN   Goal of Therapy:  Heparin level 0.3-0.7 units/ml Monitor  platelets by anticoagulation protocol: Yes   Plan:  Continue IV heparin at 1300 units/hr Check confirmatory 8 hr heparin level Daily heparin level & CBC Noted plans for 48h for medical mgmt of NSTEMI (started 8a on 12/5)  Thank you for allowing pharmacy to be a part of this patient's care.  Selinda Eon, PharmD, BCPS Clinical Pharmacist [place] 08/07/2023 9:12 AM  Addendum: Rip Harbour to stop IV heparin now per Attending  Thank you for allowing pharmacy to be a part of this patient's care.  Selinda Eon, PharmD, BCPS Clinical Pharmacist [facility] 08/07/2023 9:20 AM

## 2023-08-07 NOTE — Plan of Care (Signed)
  Problem: Nutrition: Goal: Adequate nutrition will be maintained Outcome: Progressing   Problem: Safety: Goal: Ability to remain free from injury will improve Outcome: Progressing   

## 2023-08-07 NOTE — Evaluation (Signed)
Occupational Therapy Evaluation Patient Details Name: Harry Norris MRN: [id number] DOB: 1935/09/25 Today's Date: 08/07/2023   History of Present Illness Patient is a 87 year old male who presented to the hospital on 10/5 with low back and abdominal pain. 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 Patient was living at home with family support prior level. Patient was recently d/c from hospital on 12/3 prior to this admission. Patient was noted to have poor awareness of voiding of BM with patient having medium stool unexpectedly during session. Patient was TD for hygiene with increased fatigue with minimal activity. Patient was noted to have decreased functional activity tolerance, decreased endurance, decreased standing balance, decreased safety awareness, and decreased knowledge of AD/AE impacting participation in ADLs. Patient will benefit from continued inpatient follow up therapy, <3 hours/day.        If plan is discharge home, recommend the following: Assistance with cooking/housework;Assist for transportation;Help with stairs or ramp for entrance;A lot of help with bathing/dressing/bathroom    Functional Status Assessment  Patient has had a recent decline in their functional status and demonstrates the ability to make significant improvements in function in a reasonable and predictable amount of time.  Equipment Recommendations  None recommended by OT       Precautions / Restrictions Precautions Precautions: Fall Precaution Comments: incontinent B/B Restrictions Weight Bearing Restrictions: No      Mobility Bed Mobility Overal bed mobility: Needs Assistance Bed  Mobility: Supine to Sit     Supine to sit: Supervision, Used rails, HOB elevated     General bed mobility comments: increased time and cues to slow down for line management.          Balance Overall balance assessment: Needs assistance Sitting-balance support: No upper extremity supported, Feet supported Sitting balance-Leahy Scale: Fair     Standing balance support: Bilateral upper extremity supported, Reliant on assistive device for balance Standing balance-Leahy Scale: Poor           ADL either performed or assessed with clinical judgement   ADL Overall ADL's : Needs assistance/impaired Eating/Feeding: Supervision/ safety;Sitting Eating/Feeding Details (indicate cue type and reason): with increased time. Grooming: Sitting;Minimal assistance Grooming Details (indicate cue type and reason): patient declined to complete these tasks reporting he already did them this AM> Upper Body Bathing: Sitting;Set up   Lower Body Bathing: Maximal assistance;Sitting/lateral leans;Sit to/from stand   Upper Body Dressing : Set up;Sitting   Lower Body Dressing: Maximal assistance;Sitting/lateral leans;Sit to/from stand   Toilet Transfer: Contact guard assist;+2 for physical assistance;+2 for safety/equipment;Ambulation;Rolling walker (2 wheels) Toilet Transfer Details (indicate cue type and reason): with increased time. patient able to participate in movement into hallway about half way around nurse stand then reported " i am pooping". patient had medium loose BM onto floor prior to being able to get to commode or out of hallway. Toileting- Clothing Manipulation and Hygiene: Total assistance;Sit to/from stand Toileting - Clothing Manipulation Details (indicate cue type and reason): noted to have breakdown on bottom. nurse aware.             Vision   Vision Assessment?: No apparent visual deficits  Pertinent Vitals/Pain Pain Assessment Pain Assessment: Faces Faces  Pain Scale: Hurts a little bit Pain Location: L hip Pain Descriptors / Indicators: Discomfort Pain Intervention(s): Limited activity within patient's tolerance, Monitored during session     Extremity/Trunk Assessment Upper Extremity Assessment Upper Extremity Assessment: Generalized weakness   Lower Extremity Assessment Lower Extremity Assessment: Defer to PT evaluation   Cervical / Trunk Assessment Cervical / Trunk Assessment: Kyphotic   Communication Communication Communication: Hearing impairment   Cognition Arousal: Alert Behavior During Therapy: Flat affect Overall Cognitive Status: Within Functional Limits for tasks assessed           Following Commands: Follows one step commands with increased time       General Comments: patient appeared to have decreased awareness of voiding BM. patient was able to follow commands with incrased time. patient knew he was at the hosptial but said [facility], patient asking for things multiple times during session with appearing to have decreased awareness he had asked for blankets already.                Home Living Family/patient expects to be discharged to:: Private residence Living Arrangements: Alone Available Help at Discharge: Family;Friend(s);Available PRN/intermittently Type of Home: House Home Access: Stairs to enter [employer] of Steps: 5 Entrance Stairs-Rails: Can reach both Home Layout: One level     Bathroom Shower/Tub: Tub/shower unit;Walk-in shower   Bathroom Toilet: Standard Bathroom Accessibility: Yes How Accessible: Accessible via walker Home Equipment: Rollator (4 wheels);Cane - single point;Shower Counsellor (2 wheels)   Additional Comments: most taken from chart as patient was not forthcoming about PLOF      Prior Functioning/Environment Prior Level of Function : Independent/Modified Independent;Driving             Mobility Comments: uses RW occasionally but prefers to use no  AD ADLs Comments: reports independence with all ADL/IADL        OT Problem List: Decreased strength;Decreased activity tolerance;Impaired balance (sitting and/or standing);Decreased safety awareness;Decreased knowledge of use of DME or AE      OT Treatment/Interventions: Self-care/ADL training;Therapeutic exercise;Energy conservation;DME and/or AE instruction;Manual therapy;Patient/family education;Balance training    OT Goals(Current goals can be found in the care plan section) Acute Rehab OT Goals Patient Stated Goal: to go home OT Goal Formulation: With patient Time For Goal Achievement: 08/21/23 Potential to Achieve Goals: Fair  OT Frequency: Min 1X/week    Co-evaluation PT/OT/SLP Co-Evaluation/Treatment: Yes Reason for Co-Treatment: To address functional/ADL transfers PT goals addressed during session: Mobility/safety with mobility OT goals addressed during session: ADL's and self-care      AM-PAC OT "6 Clicks" Daily Activity     Outcome Measure Help from another person eating meals?: None Help from another person taking care of personal grooming?: A Little Help from another person toileting, which includes using toliet, bedpan, or urinal?: A Lot Help from another person bathing (including washing, rinsing, drying)?: A Lot Help from another person to put on and taking off regular upper body clothing?: A Little Help from another person to put on and taking off regular lower body clothing?: A Lot 6 Click Score: 16   End of Session Equipment Utilized During Treatment: Gait belt;Rolling walker (2 wheels);Oxygen Nurse Communication: Other (comment) (in room at start of session and back in room at end of session)  Activity Tolerance: Patient limited by fatigue;Other (comment) (incontinence episode) Patient left: in chair;with call bell/phone within reach;with chair alarm set;with nursing/sitter in room  OT Visit Diagnosis: Unsteadiness on feet (  R26.81);Other abnormalities of  gait and mobility (R26.89);Muscle weakness (generalized) (M62.81)                Time: 6962-9528 OT Time Calculation (min): 34 min Charges:  OT General Charges $OT Visit: 1 Visit OT Evaluation $OT Eval Moderate Complexity: 1 Mod  Ac Colan OTR/L, MS Acute Rehabilitation Department Office# [phone]   Selinda Flavin 08/07/2023, 4:29 PM

## 2023-08-07 NOTE — Progress Notes (Addendum)
[facility] PROGRESS NOTE    Progress Note  Harry Norris  ION:[id number] DOB: Dec 22, 1935 DOA: 08/05/2023 PCP: Paulina Fusi, MD     Brief Narrative:   Harry Norris is an 87 y.o. male past medical history significant for COPD on oxygen as needed, diabetes mellitus type 2 with a hemoglobin A1c of 5.8 in 2024, essential hypertension, chronic combined systolic and diastolic heart failure, chronic kidney see stage IIIb, discharged from: On 08/03/2023 for community-acquired pneumonia and acute kidney injury, who was brought in to the hospital for lower back pain and abdominal pain that started the day prior to admission EMS was called he started complaining of shortness of breath found hypoxic 78% on 2 L.  Was placed on 4 L, troponins in the thousand in the ED was started on IV heparin drip  Assessment/Plan:   Elevated troponin I: Twelve-lead EKG showed no acute ST segment changes,  In the setting of CKD IIIb. Cardiology was consulted who recommended heparin,  this this seems to be due to demand ischemia. Started on statin and aspirin.  Discontinue IV heparin. No events on telemetry.  Hemoglobin is stable.  Acute on chronic respiratory failure with hypoxia possibly due to right-sided pleural effusion: CT angio of the chest was negative for PE, with improving infiltrates. He has been weaned to room air.   IR performed thoracocentesis and for cell count cultures Gram stain. Continue IV Rocephin and azithromycin.  Acute hypoxic encephalopathy: Likely due to Hypoxia. Now resolved.  Possible dementia without behavioral disturbance: At high risk for aspiration and agitation. Use melatonin at bedtime.  Chronic kidney disease stage IIIb: He was aggressively diuresed during last hospitalization.  His renal function appears to be at baseline. Baseline creatinine is around 1.3-1.6.  Chronic combined systolic and diastolic heart failure: Was continuing oral  Lasix.  Hyperlipidemia: Continue Lipitor and fenofibrate.  Anemia of chronic renal disease: Follow-up PCP as an outpatient.  Controlled diabetes mellitus type 2: With a last A1c of less than 6. Continue sliding scale insulin.  Urinary retention per nursing staff: He has now able to urinate since this morning Foley catheter was placed, will try a voiding trial.  Deconditioning: Patient refused skilled nursing facility in previous admission. Consult PT OT.   DVT prophylaxis: heparin Family Communication:none Status is: Inpatient Remains inpatient appropriate because: acute resp.Failure with hypoxia,     Code Status:     Code Status Orders  (From admission, onward)           Start     Ordered   08/05/23 0844  Do not attempt resuscitation (DNR)- Limited -Do Not Intubate (DNI)  Continuous       Question Answer Comment  If pulseless and not breathing No CPR or chest compressions.   In Pre-Arrest Conditions (Patient Is Breathing and Has A Pulse) Do not intubate. Provide all appropriate non-invasive medical interventions. Avoid ICU transfer unless indicated or required.   Consent: Discussion documented in EHR or advanced directives reviewed      08/05/23 0844           Code Status History     Date Active Date Inactive Code Status Order ID Comments User Context   08/01/2023 1056 08/03/2023 2334 Limited: Do not attempt resuscitation (DNR) -DNR-LIMITED -Do Not Intubate/DNI  [id number]  Burnadette Pop, MD Inpatient   07/15/2023 0951 07/16/2023 2146 Do not attempt resuscitation (DNR) PRE-ARREST INTERVENTIONS DESIRED [id number]  Dorcas Carrow, MD Inpatient   07/15/2023 [phone] 07/15/2023 0951 Full Code  [id number]  Angie Fava, DO Inpatient   07/31/2022 2208 08/02/2022 2037 Full Code [id number]  Anselm Jungling, DO Inpatient   09/26/2021 1746 09/30/2021 0313 DNR [id number]  Cora Collum, DO ED   07/29/2021 1138 08/03/2021 2234 DNR [id number]  Lorin Glass, MD ED       Advance Directive Documentation    Flowsheet Row Most Recent Value  Type of Advance Directive Healthcare Power of Attorney  Pre-existing out of facility DNR order (yellow form or pink MOST form) --  "MOST" Form in Place? --         IV Access:   Peripheral IV   Procedures and diagnostic studies:   DG Chest 1 View  Result Date: 08/06/2023 CLINICAL DATA:  Status post right-sided thoracentesis EXAM: CHEST  1 VIEW COMPARISON:  Yesterday FINDINGS: Midline trachea. Moderate cardiomegaly. Atherosclerosis in the transverse aorta. Resolved right-sided pleural effusion. Possible trace left pleural fluid. No pneumothorax. Chronic interstitial coarsening, likely related to smoking/chronic bronchitis. No overt congestive failure. Left greater than right base airspace disease, similar on the left and significantly improved on the right. IMPRESSION: No pneumothorax or other acute complication after thoracentesis. Cardiomegaly with significantly improved right base aeration. Left base atelectasis remains. Interstitial coarsening without overt congestive failure. Aortic Atherosclerosis (ICD10-I70.0). Electronically Signed   By: Jeronimo Greaves M.D.   On: 08/06/2023 14:17   US THORACENTESIS ASP PLEURAL SPACE W/IMG GUIDE  Result Date: 08/06/2023 INDICATION: Patient with history of COPD, CHF, chronic kidney disease, encephalopathy, pleural effusions, pneumonia. Request received for diagnostic and therapeutic right thoracentesis. EXAM: ULTRASOUND GUIDED DIAGNOSTIC AND THERAPEUTIC RIGHT THORACENTESIS MEDICATIONS: 8 mL 1% lidocaine COMPLICATIONS: None immediate. PROCEDURE: An ultrasound guided thoracentesis was thoroughly discussed with the patient's son and questions answered. The benefits, risks, alternatives and complications were also discussed. The patient's son understands and wishes to proceed with the procedure. Written consent was obtained. Ultrasound was performed to localize and mark an adequate pocket  of fluid in the right chest. The area was then prepped and draped in the normal sterile fashion. 1% Lidocaine was used for local anesthesia. Under ultrasound guidance a 6 Fr Safe-T-Centesis catheter was introduced. Thoracentesis was performed. The catheter was removed and a dressing applied. FINDINGS: A total of approximately 420 cc of hazy,amber fluid was removed. Samples were sent to the laboratory as requested by the clinical team. IMPRESSION: Successful ultrasound guided diagnostic and therapeutic right thoracentesis yielding 420 cc of pleural fluid. Performed by: Jeananne Rama, PA-C Electronically Signed   By: Richarda Overlie M.D.   On: 08/06/2023 13:54     Medical Consultants:   None.   Subjective:    Harry Norris chest pain or shortness of breath he relates his breathing is significantly better today.  Objective:    Vitals:   08/06/23 1900 08/06/23 2018 08/06/23 2357 08/07/23 0453  BP: (!) 122/52     Pulse: 96     Resp: (!) 26     Temp:  (!) 97.4 F (36.3 C) (!) 97.5 F (36.4 C) 97.6 F (36.4 C)  TempSrc:  Oral Oral Oral  SpO2: 96%     Weight:      Height:       SpO2: 96 % O2 Flow Rate (L/min): 2 L/min   Intake/Output Summary (Last 24 hours) at 08/07/2023 0618 Last data filed at 08/07/2023 0300 Gross per 24 hour  Intake 1083.12 ml  Output 650 ml  Net 433.12 ml   Filed Weights   08/05/23 1500  Weight: 62 kg    Exam: General exam: In no acute distress. Respiratory system: Good air movement and clear to auscultation. Cardiovascular system: S1 & S2 heard, RRR. No JVD. Gastrointestinal system: Abdomen is nondistended, soft and nontender.  Extremities: No pedal edema. Skin: No rashes, lesions or ulcers  Data Reviewed:    Labs: Basic Metabolic Panel: Recent Labs  Lab 07/31/23 2335 08/01/23 0826 08/02/23 0831 08/03/23 0652 08/05/23 0341 08/06/23 0041  NA  --  133* 135 133* 135 136  K  --  3.8 3.4* 4.1 3.8 4.1  CL  --  101 100 97* 97* 98  CO2  --  22 27  27 30 28   GLUCOSE  --  96 140* 94 105* 92  BUN  --  17 22 31* 40* 32*  CREATININE  --  1.42* 1.55* 1.91* 1.36* 1.27*  CALCIUM  --  8.6* 8.5* 8.5* 8.8* 8.5*  MG 1.5* 1.9  --   --  1.9  --    GFR Estimated Creatinine Clearance: 37.3 mL/min (A) (by C-G formula based on SCr of 1.27 mg/dL (H)). Liver Function Tests: Recent Labs  Lab 08/05/23 0341  AST 22  ALT 18  ALKPHOS 44  BILITOT 0.8  PROT 6.4*  ALBUMIN 2.6*   No results for input(s): "LIPASE", "AMYLASE" in the last 168 hours. No results for input(s): "AMMONIA" in the last 168 hours. Coagulation profile Recent Labs  Lab 08/05/23 0341  INR 1.1   COVID-19 Labs  No results for input(s): "DDIMER", "FERRITIN", "LDH", "CRP" in the last 72 hours.  Lab Results  Component Value Date   SARSCOV2NAA NEGATIVE 08/05/2023   SARSCOV2NAA NEGATIVE 07/31/2023   SARSCOV2NAA NEGATIVE 07/14/2023   SARSCOV2NAA NEGATIVE 07/31/2022    CBC: Recent Labs  Lab 07/31/23 1739 07/31/23 1754 08/01/23 0826 08/02/23 0831 08/05/23 0341 08/06/23 0041 08/07/23 0255  WBC 14.1*  --  16.6* 10.2 5.5 6.0 4.8  NEUTROABS 12.9*  --   --   --  3.5  --   --   HGB 10.0*   < > 8.9* 9.4* 9.3* 8.3* 8.7*  HCT 31.6*   < > 28.4* 29.1* 29.1* 26.3* 28.0*  MCV 91.6  --  91.0 90.9 91.2 93.6 94.0  PLT 434*  --  367 391 393 319 328   < > = values in this interval not displayed.   Cardiac Enzymes: Recent Labs  Lab 08/06/23 0041  CKTOTAL 20*   BNP (last 3 results) No results for input(s): "PROBNP" in the last 8760 hours. CBG: No results for input(s): "GLUCAP" in the last 168 hours. D-Dimer: No results for input(s): "DDIMER" in the last 72 hours. Hgb A1c: No results for input(s): "HGBA1C" in the last 72 hours. Lipid Profile: No results for input(s): "CHOL", "HDL", "LDLCALC", "TRIG", "CHOLHDL", "LDLDIRECT" in the last 72 hours. Thyroid function studies: No results for input(s): "TSH", "T4TOTAL", "T3FREE", "THYROIDAB" in the last 72 hours.  Invalid  input(s): "FREET3" Anemia work up: No results for input(s): "VITAMINB12", "FOLATE", "FERRITIN", "TIBC", "IRON", "RETICCTPCT" in the last 72 hours. Sepsis Labs: Recent Labs  Lab 07/31/23 1739 07/31/23 2335 08/01/23 0826 08/02/23 0831 08/05/23 0341 08/05/23 0522 08/06/23 0041 08/07/23 0255  PROCALCITON  --  0.89  --   --   --   --   --   --   WBC 14.1*  --    < > 10.2 5.5  --  6.0 4.8  LATICACIDVEN 1.1  --   --   --  1.1  0.9  --   --    < > = values in this interval not displayed.   Microbiology Recent Results (from the past 240 hour(s))  Culture, blood (routine x 2)     Status: None   Collection Time: 07/31/23  5:39 PM   Specimen: BLOOD  Result Value Ref Range Status   Specimen Description   Final    BLOOD LEFT ANTECUBITAL Performed at [facility], [address] [place], [place] [address]    Special Requests   Final    BOTTLES DRAWN AEROBIC AND ANAEROBIC Blood Culture results may not be optimal due to an inadequate volume of blood received in culture bottles Performed at [facility], [address] [place], [place] [address]    Culture   Final    NO GROWTH 5 DAYS Performed at [facility], [address] [place], [place] [address]    Report Status 08/06/2023 FINAL  Final  Culture, blood (routine x 2)     Status: None   Collection Time: 07/31/23  5:45 PM   Specimen: BLOOD  Result Value Ref Range Status   Specimen Description   Final    BLOOD BLOOD RIGHT WRIST Performed at [facility], [address] [place], [place] [address]    Special Requests   Final    BOTTLES DRAWN AEROBIC AND ANAEROBIC Blood Culture adequate volume Performed at [facility], [address] [place], [place] [address]    Culture   Final    NO GROWTH 5 DAYS Performed at [facility], [address], [place], [place] [address]    Report Status 08/06/2023 FINAL  Final  Resp panel by RT-PCR (RSV, Flu A&B, Covid) Anterior Nasal Swab      Status: None   Collection Time: 07/31/23  5:50 PM   Specimen: Anterior Nasal Swab  Result Value Ref Range Status   SARS Coronavirus 2 by RT PCR NEGATIVE NEGATIVE Final    Comment: (NOTE) SARS-CoV-2 target nucleic acids are NOT DETECTED.  The SARS-CoV-2 RNA is generally detectable in upper respiratory specimens during the acute phase of infection. The lowest concentration of SARS-CoV-2 viral copies this assay can detect is 138 copies/mL. A negative result does not preclude SARS-Cov-2 infection and should not be used as the sole basis for treatment or other patient management decisions. A negative result may occur with  improper specimen collection/handling, submission of specimen other than nasopharyngeal swab, presence of viral mutation(s) within the areas targeted by this assay, and inadequate number of viral copies(<138 copies/mL). A negative result must be combined with clinical observations, patient history, and epidemiological information. The expected result is Negative.  Fact Sheet for Patients:  BloggerCourse.com  Fact Sheet for Healthcare Providers:  SeriousBroker.it  This test is no t yet approved or cleared by the [place] FDA and  has been authorized for detection and/or diagnosis of SARS-CoV-2 by FDA under an Emergency Use Authorization (EUA). This EUA will remain  in effect (meaning this test can be used) for the duration of the COVID-19 declaration under Section 564(b)(1) of the Act, 21 U.S.C.section 360bbb-3(b)(1), unless the authorization is terminated  or revoked sooner.       Influenza A by PCR NEGATIVE NEGATIVE Final   Influenza B by PCR NEGATIVE NEGATIVE Final    Comment: (NOTE) The Xpert Xpress SARS-CoV-2/FLU/RSV plus assay is intended as an aid in the diagnosis of influenza from Nasopharyngeal  swab specimens and should not be used as a sole basis for treatment. Nasal washings and aspirates are  unacceptable for Xpert Xpress SARS-CoV-2/FLU/RSV testing.  Fact Sheet for Patients: BloggerCourse.com  Fact Sheet for Healthcare Providers: SeriousBroker.it  This test is not yet approved or cleared by the [place] FDA and has been authorized for detection and/or diagnosis of SARS-CoV-2 by FDA under an Emergency Use Authorization (EUA). This EUA will remain in effect (meaning this test can be used) for the duration of the COVID-19 declaration under Section 564(b)(1) of the Act, 21 U.S.C. section 360bbb-3(b)(1), unless the authorization is terminated or revoked.     Resp Syncytial Virus by PCR NEGATIVE NEGATIVE Final    Comment: (NOTE) Fact Sheet for Patients: BloggerCourse.com  Fact Sheet for Healthcare Providers: SeriousBroker.it  This test is not yet approved or cleared by the [place] FDA and has been authorized for detection and/or diagnosis of SARS-CoV-2 by FDA under an Emergency Use Authorization (EUA). This EUA will remain in effect (meaning this test can be used) for the duration of the COVID-19 declaration under Section 564(b)(1) of the Act, 21 U.S.C. section 360bbb-3(b)(1), unless the authorization is terminated or revoked.  Performed at [facility], [address] [place], [place] [address]   MRSA Next Gen by PCR, Nasal     Status: None   Collection Time: 07/31/23  9:41 PM   Specimen: Nasal Mucosa; Nasal Swab  Result Value Ref Range Status   MRSA by PCR Next Gen NOT DETECTED NOT DETECTED Final    Comment: (NOTE) The GeneXpert MRSA Assay (FDA approved for NASAL specimens only), is one component of a comprehensive MRSA colonization surveillance program. It is not intended to diagnose MRSA infection nor to guide or monitor treatment for MRSA infections. Test performance is not FDA approved in patients less than 44 years old. Performed at  [facility], [address], [place], [place] [address]   Resp panel by RT-PCR (RSV, Flu A&B, Covid) Anterior Nasal Swab     Status: None   Collection Time: 08/05/23  3:20 AM   Specimen: Anterior Nasal Swab  Result Value Ref Range Status   SARS Coronavirus 2 by RT PCR NEGATIVE NEGATIVE Final    Comment: (NOTE) SARS-CoV-2 target nucleic acids are NOT DETECTED.  The SARS-CoV-2 RNA is generally detectable in upper respiratory specimens during the acute phase of infection. The lowest concentration of SARS-CoV-2 viral copies this assay can detect is 138 copies/mL. A negative result does not preclude SARS-Cov-2 infection and should not be used as the sole basis for treatment or other patient management decisions. A negative result may occur with  improper specimen collection/handling, submission of specimen other than nasopharyngeal swab, presence of viral mutation(s) within the areas targeted by this assay, and inadequate number of viral copies(<138 copies/mL). A negative result must be combined with clinical observations, patient history, and epidemiological information. The expected result is Negative.  Fact Sheet for Patients:  BloggerCourse.com  Fact Sheet for Healthcare Providers:  SeriousBroker.it  This test is no t yet approved or cleared by the [place] FDA and  has been authorized for detection and/or diagnosis of SARS-CoV-2 by FDA under an Emergency Use Authorization (EUA). This EUA will remain  in effect (meaning this test can be used) for the duration of the COVID-19 declaration under Section 564(b)(1) of the Act, 21 U.S.C.section 360bbb-3(b)(1), unless the authorization is terminated  or revoked sooner.       Influenza A  by PCR NEGATIVE NEGATIVE Final   Influenza B by PCR NEGATIVE NEGATIVE Final    Comment: (NOTE) The Xpert Xpress SARS-CoV-2/FLU/RSV plus assay is intended as an aid in the diagnosis of  influenza from Nasopharyngeal swab specimens and should not be used as a sole basis for treatment. Nasal washings and aspirates are unacceptable for Xpert Xpress SARS-CoV-2/FLU/RSV testing.  Fact Sheet for Patients: BloggerCourse.com  Fact Sheet for Healthcare Providers: SeriousBroker.it  This test is not yet approved or cleared by the [place] FDA and has been authorized for detection and/or diagnosis of SARS-CoV-2 by FDA under an Emergency Use Authorization (EUA). This EUA will remain in effect (meaning this test can be used) for the duration of the COVID-19 declaration under Section 564(b)(1) of the Act, 21 U.S.C. section 360bbb-3(b)(1), unless the authorization is terminated or revoked.     Resp Syncytial Virus by PCR NEGATIVE NEGATIVE Final    Comment: (NOTE) Fact Sheet for Patients: BloggerCourse.com  Fact Sheet for Healthcare Providers: SeriousBroker.it  This test is not yet approved or cleared by the [place] FDA and has been authorized for detection and/or diagnosis of SARS-CoV-2 by FDA under an Emergency Use Authorization (EUA). This EUA will remain in effect (meaning this test can be used) for the duration of the COVID-19 declaration under Section 564(b)(1) of the Act, 21 U.S.C. section 360bbb-3(b)(1), unless the authorization is terminated or revoked.  Performed at [facility], [address] [place], [place] [address]   Blood culture (routine x 2)     Status: None (Preliminary result)   Collection Time: 08/05/23  5:22 AM   Specimen: BLOOD  Result Value Ref Range Status   Specimen Description   Final    BLOOD LEFT ANTECUBITAL Performed at [facility], [address], [place], [place] [address]    Special Requests   Final    BOTTLES DRAWN AEROBIC AND ANAEROBIC Blood Culture adequate volume Performed at [facility], [address], [place], [place] [address]    Culture   Final    NO GROWTH < 24 HOURS Performed at [facility], [address], [place], [place] [address]    Report Status PENDING  Incomplete  Blood culture (routine x 2)     Status: None (Preliminary result)   Collection Time: 08/05/23  5:42 AM   Specimen: BLOOD  Result Value Ref Range Status   Specimen Description   Final    BLOOD RIGHT ANTECUBITAL Performed at [facility], [address], [place], [place] [address]    Special Requests   Final    BOTTLES DRAWN AEROBIC AND ANAEROBIC Blood Culture adequate volume Performed at [facility], [address] [place], [place] [address]    Culture   Final    NO GROWTH < 24 HOURS Performed at [facility], [address], [place], [place] [address]    Report Status PENDING  Incomplete  MRSA Next Gen by PCR, Nasal     Status: None   Collection Time: 08/05/23  2:35 PM   Specimen: Nasal Mucosa; Nasal Swab  Result Value Ref Range Status   MRSA by PCR Next Gen NOT DETECTED NOT DETECTED Final    Comment: (NOTE) The GeneXpert MRSA Assay (FDA approved for NASAL specimens only), is one component of a comprehensive MRSA colonization surveillance program. It is not intended to diagnose MRSA infection nor to guide or monitor treatment for MRSA infections. Test performance  is not FDA approved in patients less than 80 years old. Performed at [facility], [address] [place], [place] [address]   Body fluid culture w Gram Stain     Status: None (Preliminary result)   Collection Time: 08/06/23 12:23 PM   Specimen: Pleura; Body Fluid  Result Value Ref Range Status   Specimen Description   Final    PLEURAL Performed at [facility], [address], [place], [place] [address]    Special Requests   Final    Normal Performed at [facility], [address], [place], [place] [address]    Gram Stain   Final    RARE WBC PRESENT,BOTH PMN AND MONONUCLEAR NO ORGANISMS SEEN Performed at [facility], [address], [place], [place] [address]    Culture PENDING  Incomplete   Report Status PENDING  Incomplete     Medications:    aspirin EC  81 mg Oral Daily   atorvastatin  40 mg Oral QPM   Chlorhexidine Gluconate Cloth  6 each Topical Daily   feeding supplement  237 mL Oral BID BM   fenofibrate  160 mg Oral Daily   furosemide  20 mg Oral Daily   melatonin  3 mg Oral QHS   pantoprazole  40 mg Oral BID   Continuous Infusions:  azithromycin Stopped (08/06/23 1017)   cefTRIAXone (ROCEPHIN)  IV Stopped (08/06/23 0856)   heparin 1,300 Units/hr (08/06/23 2304)      LOS: 2 days   Marinda Elk  [facility]  08/07/2023, 6:18 AM

## 2023-08-07 NOTE — Evaluation (Signed)
Physical Therapy Evaluation Patient Details Name: Harry Norris MRN: [id number] DOB: May 19, 1936 Today's Date: 08/07/2023  History of Present Illness  Patient is a 87 year old male who presented to the hospital on 10/5 with low back and abdominal pain. Patient was admitted with elevated troponin, acute on chronic respiratory failure with hypoxia and R side pleural effusion, acute hypoxic encephalopathy, PMH: recent d/c from hospital on 12/3 with CAP and AKI, dementia, CKDIII, hyperlipidemia, DM II, urinary retention, COPD on O2,  Clinical Impression   Patient is a  87 year old male who was admitted for above. Patient was living at home with family support prior level. Patient was recently d/c from hospital on 12/3 prior to this admission. Patient was noted to have poor awareness of voiding of BM with patient having medium stool unexpectedly during session. Patient was TD for hygiene with increased fatigue with minimal activity. Patient was noted to have decreased functional activity tolerance, decreased endurance, decreased standing/ambulatory balance, and decreased safety awareness. Patient will benefit from continued inpatient follow up therapy, <3 hours/day.        If plan is discharge home, recommend the following: A little help with walking and/or transfers;Help with stairs or ramp for entrance;Assist for transportation;Assistance with cooking/housework;A little help with bathing/dressing/bathroom   Can travel by private vehicle        Equipment Recommendations None recommended by PT  Recommendations for Other Services       Functional Status Assessment Patient has had a recent decline in their functional status and demonstrates the ability to make significant improvements in function in a reasonable and predictable amount of time.     Precautions / Restrictions Precautions Precautions: Fall Precaution Comments: incontinent B/B Restrictions Weight Bearing Restrictions: No       Mobility  Bed Mobility Overal bed mobility: Needs Assistance Bed Mobility: Supine to Sit     Supine to sit: Supervision, Used rails, HOB elevated Sit to supine: Supervision   General bed mobility comments: increased time and cues to slow down for line management.    Transfers Overall transfer level: Needs assistance Equipment used: Rolling walker (2 wheels) Transfers: Sit to/from Stand Sit to Stand: Contact guard assist           General transfer comment: Steady assist with cues for use of UEs to self assist    Ambulation/Gait Ambulation/Gait assistance: Min assist, Contact guard assist Gait Distance (Feet): 100 Feet Assistive device: Rolling walker (2 wheels) Gait Pattern/deviations: Step-through pattern, Decreased step length - right, Decreased step length - left, Shuffle, Trunk flexed Gait velocity: dec     General Gait Details: cues for posture and position from [employer]            Wheelchair Mobility     Tilt Bed    Modified Rankin (Stroke Patients Only)       Balance Overall balance assessment: Needs assistance Sitting-balance support: No upper extremity supported, Feet supported Sitting balance-Leahy Scale: Fair     Standing balance support: Bilateral upper extremity supported, Reliant on assistive device for balance Standing balance-Leahy Scale: Poor                               Pertinent Vitals/Pain Pain Assessment Pain Assessment: Faces Faces Pain Scale: Hurts a little bit Pain Location: L hip Pain Descriptors / Indicators: Discomfort Pain Intervention(s): Limited activity within patient's tolerance, Monitored during session    Home Living Family/patient  expects to be discharged to:: Private residence Living Arrangements: Alone Available Help at Discharge: Family;Friend(s);Available PRN/intermittently Type of Home: House Home Access: Stairs to enter Entrance Stairs-Rails: Can reach both Entrance Stairs-Number  of Steps: 5   Home Layout: One level Home Equipment: Rollator (4 wheels);Cane - single point;Shower Counsellor (2 wheels) Additional Comments: most taken from chart as patient was not forthcoming about PLOF    Prior Function Prior Level of Function : Independent/Modified Independent;Driving             Mobility Comments: uses RW occasionally but prefers to use no AD ADLs Comments: reports independence with all ADL/IADL     Extremity/Trunk Assessment   Upper Extremity Assessment Upper Extremity Assessment: Generalized weakness    Lower Extremity Assessment Lower Extremity Assessment: Generalized weakness    Cervical / Trunk Assessment Cervical / Trunk Assessment: Kyphotic  Communication   Communication Communication: Hearing impairment Cueing Techniques: Verbal cues;Tactile cues  Cognition Arousal: Alert Behavior During Therapy: Flat affect Overall Cognitive Status: Within Functional Limits for tasks assessed                         Following Commands: Follows one step commands with increased time       General Comments: patient appeared to have decreased awareness of voiding BM. patient was able to follow commands with increased time. patient knew he was at the hosptial but said [facility], patient asking for things multiple times during session with appearing to have decreased awareness he had asked for blankets already.        General Comments      Exercises     Assessment/Plan    PT Assessment Patient needs continued PT services  PT Problem List Decreased strength;Decreased balance;Decreased cognition;Decreased mobility;Decreased activity tolerance       PT Treatment Interventions DME instruction;Therapeutic activities;Gait training;Therapeutic exercise;Patient/family education;Functional mobility training;Balance training    PT Goals (Current goals can be found in the Care Plan section)  Acute Rehab PT Goals Patient Stated Goal: Regain IND  for return home PT Goal Formulation: With patient Potential to Achieve Goals: Good    Frequency Min 1X/week     Co-evaluation PT/OT/SLP Co-Evaluation/Treatment: Yes Reason for Co-Treatment: To address functional/ADL transfers PT goals addressed during session: Mobility/safety with mobility OT goals addressed during session: ADL's and self-care       AM-PAC PT "6 Clicks" Mobility  Outcome Measure Help needed turning from your back to your side while in a flat bed without using bedrails?: A Little Help needed moving from lying on your back to sitting on the side of a flat bed without using bedrails?: A Little Help needed moving to and from a bed to a chair (including a wheelchair)?: A Little Help needed standing up from a chair using your arms (e.g., wheelchair or bedside chair)?: A Little Help needed to walk in hospital room?: A Little Help needed climbing 3-5 steps with a railing? : A Lot 6 Click Score: 17    End of Session Equipment Utilized During Treatment: Gait belt;Oxygen Activity Tolerance: Patient tolerated treatment well Patient left: in chair;with call bell/phone within reach;with chair alarm set;Other (comment);with family/visitor present Nurse Communication: Mobility status PT Visit Diagnosis: Unsteadiness on feet (R26.81);Muscle weakness (generalized) (M62.81);Difficulty in walking, not elsewhere classified (R26.2)    Time: 2956-2130 PT Time Calculation (min) (ACUTE ONLY): 32 min   Charges:   PT Evaluation $PT Eval Low Complexity: 1 Low   PT General Charges $$ ACUTE  PT VISIT: 1 Visit         Mauro Kaufmann PT Acute Rehabilitation Services Pager [phone] Office [phone]   [facility] 08/07/2023, 4:42 PM

## 2023-08-08 DIAGNOSIS — I214 Non-ST elevation (NSTEMI) myocardial infarction: Secondary | ICD-10-CM | POA: Diagnosis not present

## 2023-08-08 DIAGNOSIS — I5022 Chronic systolic (congestive) heart failure: Secondary | ICD-10-CM | POA: Diagnosis not present

## 2023-08-08 DIAGNOSIS — J690 Pneumonitis due to inhalation of food and vomit: Secondary | ICD-10-CM | POA: Diagnosis not present

## 2023-08-08 DIAGNOSIS — R7989 Other specified abnormal findings of blood chemistry: Secondary | ICD-10-CM | POA: Diagnosis not present

## 2023-08-08 LAB — BASIC METABOLIC PANEL
Anion gap: 11 (ref 5–15)
BUN: 21 mg/dL (ref 8–23)
CO2: 28 mmol/L (ref 22–32)
Calcium: 8.8 mg/dL — ABNORMAL LOW (ref 8.9–10.3)
Chloride: 98 mmol/L (ref 98–111)
Creatinine, Ser: 1.22 mg/dL (ref 0.61–1.24)
GFR, Estimated: 58 mL/min — ABNORMAL LOW (ref >60)
Glucose, Bld: 84 mg/dL (ref 70–99)
Potassium: 3.7 mmol/L (ref 3.5–5.1)
Sodium: 137 mmol/L (ref 135–145)

## 2023-08-08 LAB — CBC
HCT: 26.6 % — ABNORMAL LOW (ref 39.0–52.0)
Hemoglobin: 8.8 g/dL — ABNORMAL LOW (ref 13.0–17.0)
MCH: 30 pg (ref 26.0–34.0)
MCHC: 33.1 g/dL (ref 30.0–36.0)
MCV: 90.8 fL (ref 80.0–100.0)
Platelets: 309 10*3/uL (ref 150–400)
RBC: 2.93 MIL/uL — ABNORMAL LOW (ref 4.22–5.81)
RDW: 16.9 % — ABNORMAL HIGH (ref 11.5–15.5)
WBC: 3.5 10*3/uL — ABNORMAL LOW (ref 4.0–10.5)
nRBC: 0 % (ref 0.0–0.2)

## 2023-08-08 MED ORDER — AZITHROMYCIN 250 MG PO TABS
500.0000 mg | ORAL_TABLET | Freq: Every day | ORAL | Status: DC
  Administered 2023-08-08 – 2023-08-10 (×3): 500 mg via ORAL
  Filled 2023-08-08 (×3): qty 2

## 2023-08-08 MED ORDER — ORAL CARE MOUTH RINSE: 15.0000 mL | OROMUCOSAL | Status: DC | PRN

## 2023-08-08 MED ORDER — AMOXICILLIN-POT CLAVULANATE 875-125 MG PO TABS
1.0000 | ORAL_TABLET | Freq: Two times a day (BID) | ORAL | Status: AC
  Administered 2023-08-08 – 2023-08-09 (×4): 1 via ORAL
  Filled 2023-08-08 (×4): qty 1

## 2023-08-08 NOTE — TOC Progression Note (Addendum)
Transition of Care [facility]) - Progression Note    Patient Details  Name: Harry Norris MRN: [id number] Date of Birth: Apr 06, 1936  Transition of Care [facility] CM/SW Contact  Adrian Prows, RN Phone Number: 08/08/2023, 1:03 PM  Clinical Narrative:    Notified pt's son requests call to discuss pt d/c to rehab; PT recc SNF; spoke w/ pt's son Harry Norris [phone]); [name] says he and his brother agree for pt to d/c to SNF; he would like bed search limited to [facility]; explained SNF process and ins auth necessary; obtained PASRR # [id number]; faxed out for bed offers; ins auth.  Expected Discharge Plan: Skilled Nursing Facility Barriers to Discharge: Continued Medical Work up  Expected Discharge Plan and Services   Discharge Planning Services: CM Consult   Living arrangements for the past 2 months: Single Family Home                                       Social Determinants of Health (SDOH) Interventions SDOH Screenings   Food Insecurity: No Food Insecurity (08/06/2023)  Housing: Low Risk  (08/06/2023)  Transportation Needs: No Transportation Needs (08/06/2023)  Utilities: Not At Risk (08/06/2023)  Depression (PHQ2-9): Low Risk  (08/07/2021)  Tobacco Use: Medium Risk (08/05/2023)    Readmission Risk Interventions    08/06/2023    2:21 PM 08/02/2022    2:14 PM 09/29/2021    5:07 PM  Readmission Risk Prevention Plan  Transportation Screening Complete Complete Complete  PCP or Specialist Appt within 3-5 Days  Complete   HRI or Home Care Consult  Complete Not Complete  HRI or Home Care Consult comments   Pending son's decision about 24 hr homecare v/s ALF  Social Work Consult for Recovery Care Planning/Counseling  Complete Not Complete  SW consult not completed comments   Pending son's decision about 24 hr homecare v/s ALF  Palliative Care Screening  Not Applicable Not Applicable  Medication Review (RN Care Manager) Complete Complete Complete  PCP or  Specialist appointment within 3-5 days of discharge Complete    HRI or Home Care Consult Complete    SW Recovery Care/Counseling Consult Complete    Palliative Care Screening Not Applicable    Skilled Nursing Facility Not Applicable

## 2023-08-08 NOTE — Progress Notes (Signed)
Physical Therapy Treatment Patient Details Name: Harry Norris MRN: [id number] DOB: October 10, 1935 Today's Date: 08/08/2023   History of Present Illness Patient is a 87 year old male who presented to the hospital on 10/5 with low back and abdominal pain. Patient was admitted with elevated troponin, acute on chronic respiratory failure with hypoxia and R side pleural effusion, acute hypoxic encephalopathy, PMH: recent d/c from hospital on 12/3 with CAP and AKI, dementia, CKDIII, hyperlipidemia, DM II, urinary retention, COPD on O2,    PT Comments  Pt continues cooperative and up to [facility] for toileting and up to ambulate increased distance in hall with noted improvement in stability.   If plan is discharge home, recommend the following: A little help with walking and/or transfers;Help with stairs or ramp for entrance;Assist for transportation;Assistance with cooking/housework;A little help with bathing/dressing/bathroom   Can travel by private vehicle     Yes  Equipment Recommendations  None recommended by PT    Recommendations for Other Services       Precautions / Restrictions Precautions Precautions: Fall Precaution Comments: incontinent B/B Restrictions Weight Bearing Restrictions: No     Mobility  Bed Mobility               General bed mobility comments: increased time and cues to slow down for line management.    Transfers Overall transfer level: Needs assistance Equipment used: Rolling walker (2 wheels) Transfers: Sit to/from Stand, Bed to chair/wheelchair/BSC Sit to Stand: Contact guard assist   Step pivot transfers: Contact guard assist       General transfer comment: Steady assist with cues for use of UEs to self assist; Step pvt recliner to [facility]    Ambulation/Gait Ambulation/Gait assistance: Min assist, Contact guard assist Gait Distance (Feet): 250 Feet Assistive device: Rolling walker (2 wheels) Gait Pattern/deviations: Step-through pattern, Decreased  step length - right, Decreased step length - left, Shuffle, Trunk flexed Gait velocity: dec     General Gait Details: cues for posture, safety awareness and position from RW   Stairs             Wheelchair Mobility     Tilt Bed    Modified Rankin (Stroke Patients Only)       Balance Overall balance assessment: Needs assistance Sitting-balance support: No upper extremity supported, Feet supported Sitting balance-Leahy Scale: Fair     Standing balance support: Bilateral upper extremity supported, Reliant on assistive device for balance Standing balance-Leahy Scale: Poor                              Cognition Arousal: Alert Behavior During Therapy: Flat affect Overall Cognitive Status: History of cognitive impairments - at baseline                         Following Commands: Follows one step commands consistently, Follows one step commands with increased time Safety/Judgement: Decreased awareness of safety, Decreased awareness of deficits              Exercises      General Comments        Pertinent Vitals/Pain Pain Assessment Pain Assessment: No/denies pain    Home Living                          Prior Function            PT Goals (current goals  can now be found in the care plan section) Acute Rehab PT Goals Patient Stated Goal: Regain IND for return home PT Goal Formulation: With patient Time For Goal Achievement: 08/15/23 Potential to Achieve Goals: Good Progress towards PT goals: Progressing toward goals    Frequency    Min 1X/week      PT Plan      Co-evaluation     PT goals addressed during session: Mobility/safety with mobility OT goals addressed during session: ADL's and self-care      AM-PAC PT "6 Clicks" Mobility   Outcome Measure  Help needed turning from your back to your side while in a flat bed without using bedrails?: A Little Help needed moving from lying on your back to  sitting on the side of a flat bed without using bedrails?: A Little Help needed moving to and from a bed to a chair (including a wheelchair)?: A Little Help needed standing up from a chair using your arms (e.g., wheelchair or bedside chair)?: A Little Help needed to walk in hospital room?: A Little Help needed climbing 3-5 steps with a railing? : A Lot 6 Click Score: 17    End of Session Equipment Utilized During Treatment: Gait belt Activity Tolerance: Patient tolerated treatment well Patient left: in chair;with call bell/phone within reach;with chair alarm set;Other (comment);with family/visitor present Nurse Communication: Mobility status PT Visit Diagnosis: Unsteadiness on feet (R26.81);Muscle weakness (generalized) (M62.81);Difficulty in walking, not elsewhere classified (R26.2)     Time: 1324-4010 PT Time Calculation (min) (ACUTE ONLY): 26 min  Charges:    $Gait Training: 8-22 mins $Therapeutic Activity: 8-22 mins PT General Charges $$ ACUTE PT VISIT: 1 Visit                     Mauro Kaufmann PT Acute Rehabilitation Services Pager [phone] Office [phone]    Taurean Ju 08/08/2023, 1:07 PM

## 2023-08-08 NOTE — Progress Notes (Signed)
[facility] PROGRESS NOTE    Progress Note  NAVEEN PETRY  WGN:[id number] DOB: [birth year] DOA: 08/05/2023 PCP: Paulina Fusi, MD     Brief Narrative:   PELLEGRINO BAINE is an 87 y.o. male past medical history significant for COPD on oxygen as needed, diabetes mellitus type 2 with a hemoglobin A1c of 5.8 in 2024, essential hypertension, chronic combined systolic and diastolic heart failure, chronic kidney see stage IIIb, discharged from: On 08/03/2023 for community-acquired pneumonia and acute kidney injury, who was brought in to the hospital for lower back pain and abdominal pain that started the day prior to admission EMS was called he started complaining of shortness of breath found hypoxic 78% on 2 L.  Was placed on 4 L, troponins in the thousand in the ED was started on IV heparin drip  Assessment/Plan:   Elevated troponin I: Twelve-lead EKG showed no acute ST segment changes,  In the setting of CKD IIIb. Cardiology was consulted who recommended heparin for 48 hours. Continue statin and aspirin.  No events on telemetry.  Hemoglobin is stable.  Acute on chronic respiratory failure with hypoxia possibly due to right-sided pleural effusion: CT angio of the chest was negative for PE, with improving infiltrates. He has been weaned to room air.   Status post thoracocentesis cultures are negative. Will transition to oral Unasyn and azithromycin. He has been weaned to room air. Physical therapy evaluated the patient recommended skilled nursing facility.  Patient is refusing skilled nursing facility spoke to the son they will come and talk to him about reconsidering his options.  Acute hypoxic encephalopathy: Likely due to Hypoxia. Now resolved.  Possible dementia without behavioral disturbance: At high risk for aspiration and agitation. Use melatonin at bedtime.  Chronic kidney disease stage IIIb: He was aggressively diuresed during last hospitalization.  His renal  function appears to be at baseline. Baseline creatinine is around 1.3-1.6.  Chronic combined systolic and diastolic heart failure: Was continuing oral Lasix.  Hyperlipidemia: Continue Lipitor and fenofibrate.  Anemia of chronic renal disease: Follow-up PCP as an outpatient.  Controlled diabetes mellitus type 2: With a last A1c of less than 6. Continue sliding scale insulin.  Urinary retention per nursing staff: Voiding trial done patient is without a Foley.  Deconditioning: Patient refused skilled nursing facility in previous admission. Consult PT OT.   DVT prophylaxis: heparin Family Communication:none Status is: Inpatient Remains inpatient appropriate because: acute resp.Failure with hypoxia,     Code Status:     Code Status Orders  (From admission, onward)           Start     Ordered   08/05/23 0844  Do not attempt resuscitation (DNR)- Limited -Do Not Intubate (DNI)  Continuous       Question Answer Comment  If pulseless and not breathing No CPR or chest compressions.   In Pre-Arrest Conditions (Patient Is Breathing and Has A Pulse) Do not intubate. Provide all appropriate non-invasive medical interventions. Avoid ICU transfer unless indicated or required.   Consent: Discussion documented in EHR or advanced directives reviewed      08/05/23 0844           Code Status History     Date Active Date Inactive Code Status Order ID Comments User Context   08/01/2023 1056 08/03/2023 2334 Limited: Do not attempt resuscitation (DNR) -DNR-LIMITED -Do Not Intubate/DNI  [id number]  Burnadette Pop, MD Inpatient   07/15/2023 0951 07/16/2023 2146 Do not attempt resuscitation (DNR) PRE-ARREST INTERVENTIONS  DESIRED [id number]  Dorcas Carrow, MD Inpatient   07/15/2023 0212 07/15/2023 0951 Full Code [id number]  Angie Fava, DO Inpatient   07/31/2022 2208 08/02/2022 2037 Full Code [id number]  Anselm Jungling, DO Inpatient   09/26/2021 1746 09/30/2021 0313 DNR [id number]  Cora Collum, DO ED   07/29/2021 1138 08/03/2021 2234 DNR [id number]  Lorin Glass, MD ED      Advance Directive Documentation    Flowsheet Row Most Recent Value  Type of Advance Directive Healthcare Power of Attorney  Pre-existing out of facility DNR order (yellow form or pink MOST form) --  "MOST" Form in Place? --         IV Access:   Peripheral IV   Procedures and diagnostic studies:   DG Chest 1 View  Result Date: 08/06/2023 CLINICAL DATA:  Status post right-sided thoracentesis EXAM: CHEST  1 VIEW COMPARISON:  Yesterday FINDINGS: Midline trachea. Moderate cardiomegaly. Atherosclerosis in the transverse aorta. Resolved right-sided pleural effusion. Possible trace left pleural fluid. No pneumothorax. Chronic interstitial coarsening, likely related to smoking/chronic bronchitis. No overt congestive failure. Left greater than right base airspace disease, similar on the left and significantly improved on the right. IMPRESSION: No pneumothorax or other acute complication after thoracentesis. Cardiomegaly with significantly improved right base aeration. Left base atelectasis remains. Interstitial coarsening without overt congestive failure. Aortic Atherosclerosis (ICD10-I70.0). Electronically Signed   By: Jeronimo Greaves M.D.   On: 08/06/2023 14:17   US THORACENTESIS ASP PLEURAL SPACE W/IMG GUIDE  Result Date: 08/06/2023 INDICATION: Patient with history of COPD, CHF, chronic kidney disease, encephalopathy, pleural effusions, pneumonia. Request received for diagnostic and therapeutic right thoracentesis. EXAM: ULTRASOUND GUIDED DIAGNOSTIC AND THERAPEUTIC RIGHT THORACENTESIS MEDICATIONS: 8 mL 1% lidocaine COMPLICATIONS: None immediate. PROCEDURE: An ultrasound guided thoracentesis was thoroughly discussed with the patient's son and questions answered. The benefits, risks, alternatives and complications were also discussed. The patient's son understands and wishes to proceed with the procedure.  Written consent was obtained. Ultrasound was performed to localize and mark an adequate pocket of fluid in the right chest. The area was then prepped and draped in the normal sterile fashion. 1% Lidocaine was used for local anesthesia. Under ultrasound guidance a 6 Fr Safe-T-Centesis catheter was introduced. Thoracentesis was performed. The catheter was removed and a dressing applied. FINDINGS: A total of approximately 420 cc of hazy,amber fluid was removed. Samples were sent to the laboratory as requested by the clinical team. IMPRESSION: Successful ultrasound guided diagnostic and therapeutic right thoracentesis yielding 420 cc of pleural fluid. Performed by: Jeananne Rama, PA-C Electronically Signed   By: Richarda Overlie M.D.   On: 08/06/2023 13:54     Medical Consultants:   None.   Subjective:    Michaeal R Nowling better today shortness of breath is improved.  Objective:    Vitals:   08/07/23 1537 08/07/23 1806 08/07/23 2110 08/08/23 0544  BP: (!) 115/42  113/77 124/67  Pulse: 82 83 86 76  Resp: 20  17 16   Temp: 98.2 F (36.8 C)  (!) 97.5 F (36.4 C) 97.6 F (36.4 C)  TempSrc:   Oral Oral  SpO2: 95% 97% 93% 92%  Weight:      Height:       SpO2: 92 % O2 Flow Rate (L/min): 4 L/min   Intake/Output Summary (Last 24 hours) at 08/08/2023 0936 Last data filed at 08/08/2023 0542 Gross per 24 hour  Intake 496.79 ml  Output 500 ml  Net -3.21 ml   Ceasar Mons  Weights   08/05/23 1500  Weight: 62 kg    Exam: General exam: In no acute distress. Respiratory system: Good air movement and clear to auscultation. Cardiovascular system: S1 & S2 heard, RRR. No JVD. Gastrointestinal system: Abdomen is nondistended, soft and nontender.  Extremities: No pedal edema. Skin: No rashes, lesions or ulcers Psychiatry: Judgement and insight appear normal. Mood & affect appropriate.  Data Reviewed:    Labs: Basic Metabolic Panel: Recent Labs  Lab 08/02/23 0831 08/03/23 0652 08/05/23 0341  08/06/23 0041 08/08/23 0533  NA 135 133* 135 136 137  K 3.4* 4.1 3.8 4.1 3.7  CL 100 97* 97* 98 98  CO2 27 27 30 28 28   GLUCOSE 140* 94 105* 92 84  BUN 22 31* 40* 32* 21  CREATININE 1.55* 1.91* 1.36* 1.27* 1.22  CALCIUM 8.5* 8.5* 8.8* 8.5* 8.8*  MG  --   --  1.9  --   --    GFR Estimated Creatinine Clearance: 38.8 mL/min (by C-G formula based on SCr of 1.22 mg/dL). Liver Function Tests: Recent Labs  Lab 08/05/23 0341  AST 22  ALT 18  ALKPHOS 44  BILITOT 0.8  PROT 6.4*  ALBUMIN 2.6*   No results for input(s): "LIPASE", "AMYLASE" in the last 168 hours. No results for input(s): "AMMONIA" in the last 168 hours. Coagulation profile Recent Labs  Lab 08/05/23 0341  INR 1.1   COVID-19 Labs  No results for input(s): "DDIMER", "FERRITIN", "LDH", "CRP" in the last 72 hours.  Lab Results  Component Value Date   SARSCOV2NAA NEGATIVE 08/05/2023   SARSCOV2NAA NEGATIVE 07/31/2023   SARSCOV2NAA NEGATIVE 07/14/2023   SARSCOV2NAA NEGATIVE 07/31/2022    CBC: Recent Labs  Lab 08/02/23 0831 08/05/23 0341 08/06/23 0041 08/07/23 0255 08/08/23 0533  WBC 10.2 5.5 6.0 4.8 3.5*  NEUTROABS  --  3.5  --   --   --   HGB 9.4* 9.3* 8.3* 8.7* 8.8*  HCT 29.1* 29.1* 26.3* 28.0* 26.6*  MCV 90.9 91.2 93.6 94.0 90.8  PLT 391 393 319 328 309   Cardiac Enzymes: Recent Labs  Lab 08/06/23 0041  CKTOTAL 20*   BNP (last 3 results) No results for input(s): "PROBNP" in the last 8760 hours. CBG: No results for input(s): "GLUCAP" in the last 168 hours. D-Dimer: No results for input(s): "DDIMER" in the last 72 hours. Hgb A1c: No results for input(s): "HGBA1C" in the last 72 hours. Lipid Profile: No results for input(s): "CHOL", "HDL", "LDLCALC", "TRIG", "CHOLHDL", "LDLDIRECT" in the last 72 hours. Thyroid function studies: No results for input(s): "TSH", "T4TOTAL", "T3FREE", "THYROIDAB" in the last 72 hours.  Invalid input(s): "FREET3" Anemia work up: No results for input(s):  "VITAMINB12", "FOLATE", "FERRITIN", "TIBC", "IRON", "RETICCTPCT" in the last 72 hours. Sepsis Labs: Recent Labs  Lab 08/05/23 0341 08/05/23 0522 08/06/23 0041 08/07/23 0255 08/08/23 0533  WBC 5.5  --  6.0 4.8 3.5*  LATICACIDVEN 1.1 0.9  --   --   --    Microbiology Recent Results (from the past 240 hour(s))  Culture, blood (routine x 2)     Status: None   Collection Time: 07/31/23  5:39 PM   Specimen: BLOOD  Result Value Ref Range Status   Specimen Description   Final    BLOOD LEFT ANTECUBITAL Performed at [facility], [address] [place], [place] [address]    Special Requests   Final    BOTTLES DRAWN AEROBIC AND ANAEROBIC Blood Culture results may not be optimal  due to an inadequate volume of blood received in culture bottles Performed at [facility], [address] [place], [place] [address]    Culture   Final    NO GROWTH 5 DAYS Performed at [facility], [address] [place], [place] [address]    Report Status 08/06/2023 FINAL  Final  Culture, blood (routine x 2)     Status: None   Collection Time: 07/31/23  5:45 PM   Specimen: BLOOD  Result Value Ref Range Status   Specimen Description   Final    BLOOD BLOOD RIGHT WRIST Performed at [facility], [address] [place], [place] [address]    Special Requests   Final    BOTTLES DRAWN AEROBIC AND ANAEROBIC Blood Culture adequate volume Performed at [facility], [address] [place], [place] [address]    Culture   Final    NO GROWTH 5 DAYS Performed at [facility], [address], [place], [place] [address]    Report Status 08/06/2023 FINAL  Final  Resp panel by RT-PCR (RSV, Flu A&B, Covid) Anterior Nasal Swab     Status: None   Collection Time: 07/31/23  5:50 PM   Specimen: Anterior Nasal Swab  Result Value Ref Range Status   SARS Coronavirus 2 by RT PCR NEGATIVE NEGATIVE Final    Comment: (NOTE) SARS-CoV-2 target nucleic acids are NOT  DETECTED.  The SARS-CoV-2 RNA is generally detectable in upper respiratory specimens during the acute phase of infection. The lowest concentration of SARS-CoV-2 viral copies this assay can detect is 138 copies/mL. A negative result does not preclude SARS-Cov-2 infection and should not be used as the sole basis for treatment or other patient management decisions. A negative result may occur with  improper specimen collection/handling, submission of specimen other than nasopharyngeal swab, presence of viral mutation(s) within the areas targeted by this assay, and inadequate number of viral copies(<138 copies/mL). A negative result must be combined with clinical observations, patient history, and epidemiological information. The expected result is Negative.  Fact Sheet for Patients:  BloggerCourse.com  Fact Sheet for Healthcare Providers:  SeriousBroker.it  This test is no t yet approved or cleared by the [place] FDA and  has been authorized for detection and/or diagnosis of SARS-CoV-2 by FDA under an Emergency Use Authorization (EUA). This EUA will remain  in effect (meaning this test can be used) for the duration of the COVID-19 declaration under Section 564(b)(1) of the Act, 21 U.S.C.section 360bbb-3(b)(1), unless the authorization is terminated  or revoked sooner.       Influenza A by PCR NEGATIVE NEGATIVE Final   Influenza B by PCR NEGATIVE NEGATIVE Final    Comment: (NOTE) The Xpert Xpress SARS-CoV-2/FLU/RSV plus assay is intended as an aid in the diagnosis of influenza from Nasopharyngeal swab specimens and should not be used as a sole basis for treatment. Nasal washings and aspirates are unacceptable for Xpert Xpress SARS-CoV-2/FLU/RSV testing.  Fact Sheet for Patients: BloggerCourse.com  Fact Sheet for Healthcare Providers: SeriousBroker.it  This test is not yet  approved or cleared by the [place] FDA and has been authorized for detection and/or diagnosis of SARS-CoV-2 by FDA under an Emergency Use Authorization (EUA). This EUA will remain in effect (meaning this test can be used) for the duration of the COVID-19 declaration under Section 564(b)(1) of the Act, 21 U.S.C. section 360bbb-3(b)(1), unless the authorization is terminated or revoked.  Resp Syncytial Virus by PCR NEGATIVE NEGATIVE Final    Comment: (NOTE) Fact Sheet for Patients: BloggerCourse.com  Fact Sheet for Healthcare Providers: SeriousBroker.it  This test is not yet approved or cleared by the [place] FDA and has been authorized for detection and/or diagnosis of SARS-CoV-2 by FDA under an Emergency Use Authorization (EUA). This EUA will remain in effect (meaning this test can be used) for the duration of the COVID-19 declaration under Section 564(b)(1) of the Act, 21 U.S.C. section 360bbb-3(b)(1), unless the authorization is terminated or revoked.  Performed at [facility], [address] [place], [place] [address]   MRSA Next Gen by PCR, Nasal     Status: None   Collection Time: 07/31/23  9:41 PM   Specimen: Nasal Mucosa; Nasal Swab  Result Value Ref Range Status   MRSA by PCR Next Gen NOT DETECTED NOT DETECTED Final    Comment: (NOTE) The GeneXpert MRSA Assay (FDA approved for NASAL specimens only), is one component of a comprehensive MRSA colonization surveillance program. It is not intended to diagnose MRSA infection nor to guide or monitor treatment for MRSA infections. Test performance is not FDA approved in patients less than 11 years old. Performed at [facility], [address] [place], [place] [address]   Resp panel by RT-PCR (RSV, Flu A&B, Covid) Anterior Nasal Swab     Status: None   Collection Time: 08/05/23  3:20 AM   Specimen: Anterior Nasal Swab  Result Value Ref  Range Status   SARS Coronavirus 2 by RT PCR NEGATIVE NEGATIVE Final    Comment: (NOTE) SARS-CoV-2 target nucleic acids are NOT DETECTED.  The SARS-CoV-2 RNA is generally detectable in upper respiratory specimens during the acute phase of infection. The lowest concentration of SARS-CoV-2 viral copies this assay can detect is 138 copies/mL. A negative result does not preclude SARS-Cov-2 infection and should not be used as the sole basis for treatment or other patient management decisions. A negative result may occur with  improper specimen collection/handling, submission of specimen other than nasopharyngeal swab, presence of viral mutation(s) within the areas targeted by this assay, and inadequate number of viral copies(<138 copies/mL). A negative result must be combined with clinical observations, patient history, and epidemiological information. The expected result is Negative.  Fact Sheet for Patients:  BloggerCourse.com  Fact Sheet for Healthcare Providers:  SeriousBroker.it  This test is no t yet approved or cleared by the [place] FDA and  has been authorized for detection and/or diagnosis of SARS-CoV-2 by FDA under an Emergency Use Authorization (EUA). This EUA will remain  in effect (meaning this test can be used) for the duration of the COVID-19 declaration under Section 564(b)(1) of the Act, 21 U.S.C.section 360bbb-3(b)(1), unless the authorization is terminated  or revoked sooner.       Influenza A by PCR NEGATIVE NEGATIVE Final   Influenza B by PCR NEGATIVE NEGATIVE Final    Comment: (NOTE) The Xpert Xpress SARS-CoV-2/FLU/RSV plus assay is intended as an aid in the diagnosis of influenza from Nasopharyngeal swab specimens and should not be used as a sole basis for treatment. Nasal washings and aspirates are unacceptable for Xpert Xpress SARS-CoV-2/FLU/RSV testing.  Fact Sheet for  Patients: BloggerCourse.com  Fact Sheet for Healthcare Providers: SeriousBroker.it  This test is not yet approved or cleared by the [place] FDA and has been authorized for detection and/or diagnosis of SARS-CoV-2 by FDA under an Emergency Use Authorization (EUA). This EUA will  remain in effect (meaning this test can be used) for the duration of the COVID-19 declaration under Section 564(b)(1) of the Act, 21 U.S.C. section 360bbb-3(b)(1), unless the authorization is terminated or revoked.     Resp Syncytial Virus by PCR NEGATIVE NEGATIVE Final    Comment: (NOTE) Fact Sheet for Patients: BloggerCourse.com  Fact Sheet for Healthcare Providers: SeriousBroker.it  This test is not yet approved or cleared by the [place] FDA and has been authorized for detection and/or diagnosis of SARS-CoV-2 by FDA under an Emergency Use Authorization (EUA). This EUA will remain in effect (meaning this test can be used) for the duration of the COVID-19 declaration under Section 564(b)(1) of the Act, 21 U.S.C. section 360bbb-3(b)(1), unless the authorization is terminated or revoked.  Performed at [facility], [address], [place], [place] [address]   Blood culture (routine x 2)     Status: None (Preliminary result)   Collection Time: 08/05/23  5:22 AM   Specimen: BLOOD  Result Value Ref Range Status   Specimen Description   Final    BLOOD LEFT ANTECUBITAL Performed at [facility], [address] [place], [place] [address]    Special Requests   Final    BOTTLES DRAWN AEROBIC AND ANAEROBIC Blood Culture adequate volume Performed at [facility], [address], [place], [place] [address]    Culture   Final    NO GROWTH 2 DAYS Performed at [facility], [address] [place], [place] [address]    Report Status  PENDING  Incomplete  Blood culture (routine x 2)     Status: None (Preliminary result)   Collection Time: 08/05/23  5:42 AM   Specimen: BLOOD  Result Value Ref Range Status   Specimen Description   Final    BLOOD RIGHT ANTECUBITAL Performed at [facility], [address], [place], [place] [address]    Special Requests   Final    BOTTLES DRAWN AEROBIC AND ANAEROBIC Blood Culture adequate volume Performed at [facility], [address] [place], [place] [address]    Culture   Final    NO GROWTH 2 DAYS Performed at [facility], [address], [place], [place] [address]    Report Status PENDING  Incomplete  MRSA Next Gen by PCR, Nasal     Status: None   Collection Time: 08/05/23  2:35 PM   Specimen: Nasal Mucosa; Nasal Swab  Result Value Ref Range Status   MRSA by PCR Next Gen NOT DETECTED NOT DETECTED Final    Comment: (NOTE) The GeneXpert MRSA Assay (FDA approved for NASAL specimens only), is one component of a comprehensive MRSA colonization surveillance program. It is not intended to diagnose MRSA infection nor to guide or monitor treatment for MRSA infections. Test performance is not FDA approved in patients less than 58 years old. Performed at [facility], [address], [place], [place] [address]   Body fluid culture w Gram Stain     Status: None (Preliminary result)   Collection Time: 08/06/23 12:23 PM   Specimen: Pleura; Body Fluid  Result Value Ref Range Status   Specimen Description   Final    PLEURAL Performed at [facility], [address] [place], [place] [address]    Special Requests   Final    Normal Performed at [facility], [address] [place], [place] [address]    Gram  Stain   Final    RARE WBC PRESENT,BOTH PMN AND MONONUCLEAR NO ORGANISMS SEEN    Culture   Final    NO GROWTH 2 DAYS Performed at [facility], [address]  [place], [place] [address]    Report Status PENDING  Incomplete     Medications:    aspirin EC  81 mg Oral Daily   atorvastatin  40 mg Oral QPM   Chlorhexidine Gluconate Cloth  6 each Topical Daily   feeding supplement  237 mL Oral BID BM   fenofibrate  160 mg Oral Daily   furosemide  20 mg Oral Daily   melatonin  3 mg Oral QHS   pantoprazole  40 mg Oral BID   Continuous Infusions:      LOS: 3 days   Marinda Elk  [facility]  08/08/2023, 9:36 AM

## 2023-08-08 NOTE — [place] FL2 (Signed)
[place] MEDICAID FL2 LEVEL OF CARE FORM     IDENTIFICATION  Patient Name: Harry Norris Birthdate: Dec 02, 1935 Sex: male Admission Date (Current Location): 08/05/2023  [facility] and [place] Number:  Producer, television/film/video and Address:  [facility],  [address] [place], [place] [address]      Provider Number: [id number]  Attending Physician Name and Address:  Marinda Elk, MD  Relative Name and Phone Number:  Dyer Huizar (son) [phone]    Current Level of Care: Hospital Recommended Level of Care: Skilled Nursing Facility Prior Approval Number:    Date Approved/Denied:   PASRR Number: [id number]  Discharge Plan: SNF    Current Diagnoses: Patient Active Problem List   Diagnosis Date Noted   Elevated troponin level not due myocardial infarction 08/06/2023   Demand ischemia (HCC) 08/06/2023   Acute on chronic hypoxic respiratory failure (HCC) 08/06/2023   Chronic HFrEF (heart failure with reduced ejection fraction) (HCC) 08/06/2023   Diabetes mellitus type 2, noninsulin dependent (HCC) 08/06/2023   NSTEMI (non-ST elevated myocardial infarction) (HCC) 08/05/2023   Nosocomial pneumonia 07/31/2023   AMS (altered mental status) 07/15/2023   Acute metabolic encephalopathy 07/15/2023   Hypokalemia 07/15/2023   History of COPD 07/15/2023   Chronic hypoxic respiratory failure (HCC) 07/15/2023   Acute on chronic combined systolic and diastolic CHF (congestive heart failure) (HCC) 07/15/2023   Aspiration pneumonia (HCC) 07/31/2022   Right upper lobe pulmonary nodule 07/31/2022   AKI (acute kidney injury) (HCC) 07/31/2022   Essential hypertension 03/19/2022   Anemia of chronic disease 03/18/2022   Benign neoplasm of colon 03/18/2022   Bronchitis 03/18/2022   Cancer (HCC) 03/18/2022   Hypertension 03/18/2022   Kidney stone 03/18/2022   Diarrhea    Diverticulitis    Blood in the stool    Renal insufficiency 09/26/2021   Pressure injury of skin  07/30/2021   Respiratory failure (HCC) 07/29/2021   Influenza 07/29/2021   Acute hypoxemic respiratory failure (HCC) 07/12/2021   CKD stage 3b, GFR 30-44 ml/min (HCC) 07/12/2021   Community acquired pneumonia of right lower lobe of lung 07/12/2021   Sepsis (HCC) 07/12/2021   Chronic obstructive pulmonary disease with acute exacerbation (HCC) 07/12/2021   Bradycardia 12/19/2018   Orthostatic hypotension 12/19/2018   Fatigue 12/12/2018   Hyperlipidemia 02/07/2018   Asthma 02/07/2018   Chronic rhinitis 02/26/2016   Cardiomyopathy (HCC) 12/18/2015   Chronic venous insufficiency 12/18/2015   LBBB (left bundle branch block) 12/18/2015   Syncope 12/18/2015   Hypoxemia 04/10/2014   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   Hypertensive heart disease    Prostate cancer (HCC)    AAA (abdominal aortic aneurysm) (HCC)    Carotid artery occlusion    DM2 (diabetes mellitus, type 2) (HCC)    Leg swelling 11/28/2013   Other symptoms involving cardiovascular system 11/28/2013   Varicose veins of bilateral lower extremities with other complications 11/28/2013    Orientation RESPIRATION BLADDER Height & Weight     Self  O2 External catheter Weight: 62 kg Height:  5\' 10"  (177.8 cm)  BEHAVIORAL SYMPTOMS/MOOD NEUROLOGICAL BOWEL NUTRITION STATUS      Incontinent Diet (regular)  AMBULATORY STATUS COMMUNICATION OF NEEDS Skin   Limited Assist Verbally Other (Comment), Skin abrasions (dry, flaky skin; abrasions to bilateral LEs)                       Personal Care Assistance Level of Assistance  Total care Bathing Assistance: Maximum assistance  Feeding assistance: Independent   Total Care Assistance: Maximum assistance   Functional Limitations Info  Sight, Hearing, Speech Sight Info: Impaired Hearing Info: Impaired Speech Info: Adequate    SPECIAL CARE FACTORS FREQUENCY  PT (By licensed PT), OT (By licensed OT)     PT Frequency: 5x/week OT Frequency: 5x/week             Contractures Contractures Info: Not present    Additional Factors Info  Code Status, Allergies Code Status Info: DNR Allergies Info: Ace Inhibitors           Current Medications (08/08/2023):  This is the current hospital active medication list Current Facility-Administered Medications  Medication Dose Route Frequency Provider Last Rate Last Admin   acetaminophen (TYLENOL) tablet 650 mg  650 mg Oral Q6H PRN Marinda Elk, MD   650 mg at 08/07/23 2228   Or   acetaminophen (TYLENOL) suppository 650 mg  650 mg Rectal Q6H PRN Marinda Elk, MD       albuterol (PROVENTIL) (2.5 MG/3ML) 0.083% nebulizer solution 2.5 mg  2.5 mg Nebulization Q2H PRN Marinda Elk, MD       amoxicillin-clavulanate (AUGMENTIN) 875-125 MG per tablet 1 tablet  1 tablet Oral Q12H Marinda Elk, MD   1 tablet at 08/08/23 0947   aspirin EC tablet 81 mg  81 mg Oral Daily Marinda Elk, MD   81 mg at 08/08/23 0939   atorvastatin (LIPITOR) tablet 40 mg  40 mg Oral QPM Marinda Elk, MD   40 mg at 08/07/23 1731   azithromycin (ZITHROMAX) tablet 500 mg  500 mg Oral Daily Marinda Elk, MD   500 mg at 08/08/23 0946   feeding supplement (ENSURE ENLIVE / ENSURE PLUS) liquid 237 mL  237 mL Oral BID BM Marinda Elk, MD   237 mL at 08/08/23 0946   fenofibrate tablet 160 mg  160 mg Oral Daily Marinda Elk, MD   160 mg at 08/08/23 8413   furosemide (LASIX) tablet 20 mg  20 mg Oral Daily Marinda Elk, MD   20 mg at 08/08/23 2440   melatonin tablet 3 mg  3 mg Oral QHS Marinda Elk, MD   3 mg at 08/07/23 2228   ondansetron (ZOFRAN) tablet 4 mg  4 mg Oral Q6H PRN Marinda Elk, MD       Or   ondansetron [facility]) injection 4 mg  4 mg Intravenous Q6H PRN Marinda Elk, MD       Oral care mouth rinse  15 mL Mouth Rinse PRN Marinda Elk, MD       pantoprazole (PROTONIX) EC tablet 40 mg  40 mg Oral BID Marinda Elk, MD   40 mg  at 08/08/23 1027     Discharge Medications: Please see discharge summary for a list of discharge medications.  Relevant Imaging Results:  Relevant Lab Results:   Additional Information SSN [id number]  Adrian Prows, RN

## 2023-08-09 DIAGNOSIS — I5022 Chronic systolic (congestive) heart failure: Secondary | ICD-10-CM | POA: Diagnosis not present

## 2023-08-09 DIAGNOSIS — J690 Pneumonitis due to inhalation of food and vomit: Secondary | ICD-10-CM | POA: Diagnosis not present

## 2023-08-09 DIAGNOSIS — I214 Non-ST elevation (NSTEMI) myocardial infarction: Secondary | ICD-10-CM | POA: Diagnosis not present

## 2023-08-09 DIAGNOSIS — R7989 Other specified abnormal findings of blood chemistry: Secondary | ICD-10-CM | POA: Diagnosis not present

## 2023-08-09 LAB — CBC
HCT: 26.6 % — ABNORMAL LOW (ref 39.0–52.0)
Hemoglobin: 8.4 g/dL — ABNORMAL LOW (ref 13.0–17.0)
MCH: 29.1 pg (ref 26.0–34.0)
MCHC: 31.6 g/dL (ref 30.0–36.0)
MCV: 92 fL (ref 80.0–100.0)
Platelets: 296 10*3/uL (ref 150–400)
RBC: 2.89 MIL/uL — ABNORMAL LOW (ref 4.22–5.81)
RDW: 17 % — ABNORMAL HIGH (ref 11.5–15.5)
WBC: 4.7 10*3/uL (ref 4.0–10.5)
nRBC: 0 % (ref 0.0–0.2)

## 2023-08-09 LAB — BODY FLUID CULTURE W GRAM STAIN: Special Requests: NORMAL

## 2023-08-09 LAB — CYTOLOGY - NON PAP

## 2023-08-09 MED ORDER — PROCHLORPERAZINE EDISYLATE 10 MG/2ML IJ SOLN: 5.0000 mg | Freq: Once | INTRAMUSCULAR | Status: DC | PRN

## 2023-08-09 MED ORDER — AMOXICILLIN-POT CLAVULANATE 875-125 MG PO TABS: 1.0000 | ORAL_TABLET | Freq: Two times a day (BID) | ORAL | 0 refills | Status: DC

## 2023-08-09 MED ORDER — AZITHROMYCIN 250 MG PO TABS: ORAL_TABLET | ORAL | Status: AC

## 2023-08-09 MED ORDER — GUAIFENESIN-DM 100-10 MG/5ML PO SYRP: 5.0000 mL | ORAL_SOLUTION | ORAL | Status: DC | PRN

## 2023-08-09 NOTE — Progress Notes (Signed)
Mobility Specialist - Progress Note   08/09/23 0944  Mobility  Activity Ambulated with assistance to bathroom  Level of Assistance Minimal assist, patient does 75% or more  Assistive Device Front wheel walker  Distance Ambulated (ft) 15 ft  Range of Motion/Exercises Active  Activity Response Tolerated well  Mobility Referral Yes  Mobility visit 1 Mobility  Mobility Specialist Start Time (ACUTE ONLY) 0930  Mobility Specialist Stop Time (ACUTE ONLY) 0944  Mobility Specialist Time Calculation (min) (ACUTE ONLY) 14 min   Received in bed and agreed to mobility. Upon standing pt felt a BM, opted to ambulate to restroom first. Then breakfast arrived so returned to chair with all needs met.  Marilynne Halsted Mobility Specialist

## 2023-08-09 NOTE — Plan of Care (Signed)
  Problem: Education: Goal: Knowledge of General Education information will improve Description: Including pain rating scale, medication(s)/side effects and non-pharmacologic comfort measures Outcome: Progressing   Problem: Health Behavior/Discharge Planning: Goal: Ability to manage health-related needs will improve Outcome: Progressing   Problem: Clinical Measurements: Goal: Ability to maintain clinical measurements within normal limits will improve Outcome: Progressing   Problem: Clinical Measurements: Goal: Diagnostic test results will improve Outcome: Progressing   Problem: Clinical Measurements: Goal: Respiratory complications will improve Outcome: Progressing   

## 2023-08-09 NOTE — Consult Note (Signed)
[facility] Liaison Consult Note   08/09/2023  CJ GOREY 01-Mar-1936 [id number]  *[facility]] remote coverage review for patient admitted to [facility] on 08/05/23  Primary Care Provider:  Paulina Fusi, MD, with [facility] is listed  Insurance: [employer]  Patient was reviewed for less than 7 days readmission high risk score 4 day length of stay for post hospital barriers to care  Patient was screened for hospitalization and on behalf of [facility]  Care Coordination to assess for post hospital community care needs.  Patient is being considered for a skilled nursing facility level of care for post hospital transition.  If the patient goes to a [facility] affiliated facility then, patient can be followed by [facility] RN with traditional Medicare and approved Medicare Advantage plans.   Plan:  If transitions to affiliated facility, then will notify the [facility] RN can follow for any known or needs for transitional care needs for returning to post facility care coordination needs to return to community.  For questions or referrals, please contact:  Charlesetta Shanks, RN, BSN, CCM [place]  [facility], Population Health, [facility] Liaison Direct Dial: [phone] or secure chat Email: [email or web address]

## 2023-08-09 NOTE — TOC Progression Note (Addendum)
Transition of Care [facility]) - Progression Note    Patient Details  Name: Harry Norris MRN: [id number] Date of Birth: [birth year]  [facility] CM/SW Contact  Otelia Santee, LCSW Phone Number: 08/09/2023, 1:51 PM  Clinical Narrative:    Reviewed bed offers with pt's son, Delores Alder. Family have accepted bed offer for [facility]. Pt's son expressed his concerns with pt not being safe to return home. CSW discussed need for insurance approval for SNF and possibility insurance may deny SNF due to pt ambulating 254ft. CSW discussed possible need to begin seeking LTC placement for pt. Pt's son agreeable to this.  Insurance auth for SNF has been requested and is currently pending.   ADDENDUM: Received call from [facility] who are following pt for LTC placement. CSW faxed records to assist with placement for pt. If insurance denies for SNF placement they request a TB test be completed to assist with LTC placement for pt.   Expected Discharge Plan: Skilled Nursing Facility Barriers to Discharge: Continued Medical Work up  Expected Discharge Plan and Services   Discharge Planning Services: CM Consult   Living arrangements for the past 2 months: Single Family Home Expected Discharge Date: 08/09/23                                     Social Determinants of Health (SDOH) Interventions SDOH Screenings   Food Insecurity: No Food Insecurity (08/06/2023)  Housing: Low Risk  (08/06/2023)  Transportation Needs: No Transportation Needs (08/06/2023)  Utilities: Not At Risk (08/06/2023)  Depression (PHQ2-9): Low Risk  (08/07/2021)  Tobacco Use: Medium Risk (08/05/2023)    Readmission Risk Interventions    08/06/2023    2:21 PM 08/02/2022    2:14 PM 09/29/2021    5:07 PM  Readmission Risk Prevention Plan  Transportation Screening Complete Complete Complete  PCP or Specialist Appt within 3-5 Days  Complete   HRI or Home Care Consult  Complete Not  Complete  HRI or Home Care Consult comments   Pending son's decision about 24 hr homecare v/s ALF  Social Work Consult for Recovery Care Planning/Counseling  Complete Not Complete  SW consult not completed comments   Pending son's decision about 24 hr homecare v/s ALF  Palliative Care Screening  Not Applicable Not Applicable  Medication Review (RN Care Manager) Complete Complete Complete  PCP or Specialist appointment within 3-5 days of discharge Complete    HRI or Home Care Consult Complete    SW Recovery Care/Counseling Consult Complete    Palliative Care Screening Not Applicable    Skilled Nursing Facility Not Applicable

## 2023-08-09 NOTE — Care Management Important Message (Signed)
Important Message  Patient Details IM Letter given. Name: DELVONTA ROOTES MRN: [id number] Date of Birth: 20-Aug-1936   Important Message Given:  Yes - Medicare IM     Caren Macadam 08/09/2023, 9:33 AM

## 2023-08-09 NOTE — Progress Notes (Signed)
[facility] PROGRESS NOTE    Progress Note  Harry Norris  XTG:[id number] DOB: [birth year] DOA: 08/05/2023 PCP: Paulina Fusi, MD     Brief Narrative:   Harry Norris is an 87 y.o. male past medical history significant for COPD on oxygen as needed, diabetes mellitus type 2 with a hemoglobin A1c of 5.8 in 2024, essential hypertension, chronic combined systolic and diastolic heart failure, chronic kidney see stage IIIb, discharged from: On 08/03/2023 for community-acquired pneumonia and acute kidney injury, who was brought in to the hospital for lower back pain and abdominal pain that started the day prior to admission EMS was called he started complaining of shortness of breath found hypoxic 78% on 2 L.  Was placed on 4 L, troponins in the thousand in the ED was started on IV heparin drip. CT angio of the chest was negative for PE, with improving infiltrates. Hep stop after 48 hrs.  Assessment/Plan:   Elevated troponin I: Twelve-lead EKG showed no acute ST segment changes,  In the setting of CKD IIIb. Likely demand ischemia. Cards agree. Awaiting placement  Acute on chronic respiratory failure with hypoxia possibly due to right-sided pleural effusion: He has been weaned to room air.   Status post thoracocentesis cultures are negative gram stain no organism. Continue send and azithromycin. Physical therapy evaluated the patient recommended skilled nursing facility.   Acute hypoxic encephalopathy: Likely due to Hypoxia. Now resolved.  Possible dementia without behavioral disturbance: At high risk for aspiration and agitation. Use melatonin at bedtime.  Chronic kidney disease stage IIIb: He was aggressively diuresed during last hospitalization.  His renal function appears to be at baseline. Baseline creatinine is around 1.3-1.6.  Chronic combined systolic and diastolic heart failure: Was continuing oral Lasix.  Hyperlipidemia: Continue Lipitor and  fenofibrate.  Anemia of chronic renal disease: Follow-up PCP as an outpatient.  Controlled diabetes mellitus type 2: With a last A1c of less than 6. Continue sliding scale insulin.  Urinary retention per nursing staff: Voiding trial done patient is without a Foley.  Possible mild dementia: Will need to be evaluated as an outpatient.  Deconditioning: Patient refused skilled nursing facility in previous admission. Consult PT OT.   DVT prophylaxis: heparin Family Communication:none Status is: Inpatient Remains inpatient appropriate because: acute resp.Failure with hypoxia,     Code Status:     Code Status Orders  (From admission, onward)           Start     Ordered   08/05/23 0844  Do not attempt resuscitation (DNR)- Limited -Do Not Intubate (DNI)  Continuous       Question Answer Comment  If pulseless and not breathing No CPR or chest compressions.   In Pre-Arrest Conditions (Patient Is Breathing and Has A Pulse) Do not intubate. Provide all appropriate non-invasive medical interventions. Avoid ICU transfer unless indicated or required.   Consent: Discussion documented in EHR or advanced directives reviewed      08/05/23 0844           Code Status History     Date Active Date Inactive Code Status Order ID Comments User Context   08/01/2023 1056 08/03/2023 2334 Limited: Do not attempt resuscitation (DNR) -DNR-LIMITED -Do Not Intubate/DNI  [id number]  Burnadette Pop, MD Inpatient   07/15/2023 0951 07/16/2023 2146 Do not attempt resuscitation (DNR) PRE-ARREST INTERVENTIONS DESIRED [id number]  Dorcas Carrow, MD Inpatient   07/15/2023 0212 07/15/2023 0951 Full Code [id number]  Angie Fava, DO Inpatient   07/31/2022  2208 08/02/2022 2037 Full Code [id number]  Anselm Jungling, DO Inpatient   09/26/2021 1746 09/30/2021 0313 DNR [id number]  Cora Collum, DO ED   07/29/2021 1138 08/03/2021 2234 DNR [id number]  Lorin Glass, MD ED      Advance Directive Documentation     Flowsheet Row Most Recent Value  Type of Advance Directive Healthcare Power of Attorney  Pre-existing out of facility DNR order (yellow form or pink MOST form) --  "MOST" Form in Place? --         IV Access:   Peripheral IV   Procedures and diagnostic studies:   No results found.   Medical Consultants:   None.   Subjective:    Harry Norris no complaints today  Objective:    Vitals:   08/08/23 0544 08/08/23 1344 08/08/23 1944 08/09/23 0457  BP: 124/67 (!) 114/53 (!) 120/47 112/61  Pulse: 76 83 85 81  Resp: 16 18 15 20   Temp: 97.6 F (36.4 C) 98.1 F (36.7 C) 98 F (36.7 C) 97.9 F (36.6 C)  TempSrc: Oral     SpO2: 92% 97% 99% 92%  Weight:      Height:       SpO2: 92 % O2 Flow Rate (L/min): 4 L/min   Intake/Output Summary (Last 24 hours) at 08/09/2023 0909 Last data filed at 08/09/2023 0556 Gross per 24 hour  Intake 340 ml  Output 1200 ml  Net -860 ml   Filed Weights   08/05/23 1500  Weight: 62 kg    Exam: General exam: In no acute distress. Respiratory system: Good air movement and clear to auscultation. Cardiovascular system: S1 & S2 heard, RRR. No JVD. Gastrointestinal system: Abdomen is nondistended, soft and nontender.  Extremities: No pedal edema. Skin: No rashes, lesions or ulcers Psychiatry: Judgement and insight appear normal. Mood & affect appropriate.  Data Reviewed:    Labs: Basic Metabolic Panel: Recent Labs  Lab 08/03/23 0652 08/05/23 0341 08/06/23 0041 08/08/23 0533  NA 133* 135 136 137  K 4.1 3.8 4.1 3.7  CL 97* 97* 98 98  CO2 27 30 28 28   GLUCOSE 94 105* 92 84  BUN 31* 40* 32* 21  CREATININE 1.91* 1.36* 1.27* 1.22  CALCIUM 8.5* 8.8* 8.5* 8.8*  MG  --  1.9  --   --    GFR Estimated Creatinine Clearance: 38.8 mL/min (by C-G formula based on SCr of 1.22 mg/dL). Liver Function Tests: Recent Labs  Lab 08/05/23 0341  AST 22  ALT 18  ALKPHOS 44  BILITOT 0.8  PROT 6.4*  ALBUMIN 2.6*   No results for  input(s): "LIPASE", "AMYLASE" in the last 168 hours. No results for input(s): "AMMONIA" in the last 168 hours. Coagulation profile Recent Labs  Lab 08/05/23 0341  INR 1.1   COVID-19 Labs  No results for input(s): "DDIMER", "FERRITIN", "LDH", "CRP" in the last 72 hours.  Lab Results  Component Value Date   SARSCOV2NAA NEGATIVE 08/05/2023   SARSCOV2NAA NEGATIVE 07/31/2023   SARSCOV2NAA NEGATIVE 07/14/2023   SARSCOV2NAA NEGATIVE 07/31/2022    CBC: Recent Labs  Lab 08/05/23 0341 08/06/23 0041 08/07/23 0255 08/08/23 0533 08/09/23 0504  WBC 5.5 6.0 4.8 3.5* 4.7  NEUTROABS 3.5  --   --   --   --   HGB 9.3* 8.3* 8.7* 8.8* 8.4*  HCT 29.1* 26.3* 28.0* 26.6* 26.6*  MCV 91.2 93.6 94.0 90.8 92.0  PLT 393 319 328 309 296   Cardiac Enzymes: Recent Labs  Lab 08/06/23 0041  CKTOTAL 20*   BNP (last 3 results) No results for input(s): "PROBNP" in the last 8760 hours. CBG: No results for input(s): "GLUCAP" in the last 168 hours. D-Dimer: No results for input(s): "DDIMER" in the last 72 hours. Hgb A1c: No results for input(s): "HGBA1C" in the last 72 hours. Lipid Profile: No results for input(s): "CHOL", "HDL", "LDLCALC", "TRIG", "CHOLHDL", "LDLDIRECT" in the last 72 hours. Thyroid function studies: No results for input(s): "TSH", "T4TOTAL", "T3FREE", "THYROIDAB" in the last 72 hours.  Invalid input(s): "FREET3" Anemia work up: No results for input(s): "VITAMINB12", "FOLATE", "FERRITIN", "TIBC", "IRON", "RETICCTPCT" in the last 72 hours. Sepsis Labs: Recent Labs  Lab 08/05/23 0341 08/05/23 0522 08/06/23 0041 08/07/23 0255 08/08/23 0533 08/09/23 0504  WBC 5.5  --  6.0 4.8 3.5* 4.7  LATICACIDVEN 1.1 0.9  --   --   --   --    Microbiology Recent Results (from the past 240 hour(s))  Culture, blood (routine x 2)     Status: None   Collection Time: 07/31/23  5:39 PM   Specimen: BLOOD  Result Value Ref Range Status   Specimen Description   Final    BLOOD LEFT  ANTECUBITAL Performed at [facility], [address] [place], [place] [address]    Special Requests   Final    BOTTLES DRAWN AEROBIC AND ANAEROBIC Blood Culture results may not be optimal due to an inadequate volume of blood received in culture bottles Performed at [facility], [address] [place], [place] [address]    Culture   Final    NO GROWTH 5 DAYS Performed at [facility], [address], [place], [place] [address]    Report Status 08/06/2023 FINAL  Final  Culture, blood (routine x 2)     Status: None   Collection Time: 07/31/23  5:45 PM   Specimen: BLOOD  Result Value Ref Range Status   Specimen Description   Final    BLOOD BLOOD RIGHT WRIST Performed at [facility], [address] [place], [place] [address]    Special Requests   Final    BOTTLES DRAWN AEROBIC AND ANAEROBIC Blood Culture adequate volume Performed at [facility], [address] [place], [place] [address]    Culture   Final    NO GROWTH 5 DAYS Performed at [facility], [address], [place], [place] [address]    Report Status 08/06/2023 FINAL  Final  Resp panel by RT-PCR (RSV, Flu A&B, Covid) Anterior Nasal Swab     Status: None   Collection Time: 07/31/23  5:50 PM   Specimen: Anterior Nasal Swab  Result Value Ref Range Status   SARS Coronavirus 2 by RT PCR NEGATIVE NEGATIVE Final    Comment: (NOTE) SARS-CoV-2 target nucleic acids are NOT DETECTED.  The SARS-CoV-2 RNA is generally detectable in upper respiratory specimens during the acute phase of infection. The lowest concentration of SARS-CoV-2 viral copies this assay can detect is 138 copies/mL. A negative result does not preclude SARS-Cov-2 infection and should not be used as the sole basis for treatment or other patient management decisions. A negative result may occur with  improper specimen collection/handling, submission of specimen other than nasopharyngeal swab, presence  of viral mutation(s) within the areas targeted by this assay, and inadequate number of viral copies(<138 copies/mL). A negative result must be combined with clinical observations, patient history, and epidemiological  information. The expected result is Negative.  Fact Sheet for Patients:  BloggerCourse.com  Fact Sheet for Healthcare Providers:  SeriousBroker.it  This test is no t yet approved or cleared by the [place] FDA and  has been authorized for detection and/or diagnosis of SARS-CoV-2 by FDA under an Emergency Use Authorization (EUA). This EUA will remain  in effect (meaning this test can be used) for the duration of the COVID-19 declaration under Section 564(b)(1) of the Act, 21 U.S.C.section 360bbb-3(b)(1), unless the authorization is terminated  or revoked sooner.       Influenza A by PCR NEGATIVE NEGATIVE Final   Influenza B by PCR NEGATIVE NEGATIVE Final    Comment: (NOTE) The Xpert Xpress SARS-CoV-2/FLU/RSV plus assay is intended as an aid in the diagnosis of influenza from Nasopharyngeal swab specimens and should not be used as a sole basis for treatment. Nasal washings and aspirates are unacceptable for Xpert Xpress SARS-CoV-2/FLU/RSV testing.  Fact Sheet for Patients: BloggerCourse.com  Fact Sheet for Healthcare Providers: SeriousBroker.it  This test is not yet approved or cleared by the [place] FDA and has been authorized for detection and/or diagnosis of SARS-CoV-2 by FDA under an Emergency Use Authorization (EUA). This EUA will remain in effect (meaning this test can be used) for the duration of the COVID-19 declaration under Section 564(b)(1) of the Act, 21 U.S.C. section 360bbb-3(b)(1), unless the authorization is terminated or revoked.     Resp Syncytial Virus by PCR NEGATIVE NEGATIVE Final    Comment: (NOTE) Fact Sheet for  Patients: BloggerCourse.com  Fact Sheet for Healthcare Providers: SeriousBroker.it  This test is not yet approved or cleared by the [place] FDA and has been authorized for detection and/or diagnosis of SARS-CoV-2 by FDA under an Emergency Use Authorization (EUA). This EUA will remain in effect (meaning this test can be used) for the duration of the COVID-19 declaration under Section 564(b)(1) of the Act, 21 U.S.C. section 360bbb-3(b)(1), unless the authorization is terminated or revoked.  Performed at [facility], [address] [place], [place] [address]   MRSA Next Gen by PCR, Nasal     Status: None   Collection Time: 07/31/23  9:41 PM   Specimen: Nasal Mucosa; Nasal Swab  Result Value Ref Range Status   MRSA by PCR Next Gen NOT DETECTED NOT DETECTED Final    Comment: (NOTE) The GeneXpert MRSA Assay (FDA approved for NASAL specimens only), is one component of a comprehensive MRSA colonization surveillance program. It is not intended to diagnose MRSA infection nor to guide or monitor treatment for MRSA infections. Test performance is not FDA approved in patients less than 87 years old. Performed at [facility], [address], [place], [place] [address]   Resp panel by RT-PCR (RSV, Flu A&B, Covid) Anterior Nasal Swab     Status: None   Collection Time: 08/05/23  3:20 AM   Specimen: Anterior Nasal Swab  Result Value Ref Range Status   SARS Coronavirus 2 by RT PCR NEGATIVE NEGATIVE Final    Comment: (NOTE) SARS-CoV-2 target nucleic acids are NOT DETECTED.  The SARS-CoV-2 RNA is generally detectable in upper respiratory specimens during the acute phase of infection. The lowest concentration of SARS-CoV-2 viral copies this assay can detect is 138 copies/mL. A negative result does not preclude SARS-Cov-2 infection and should not be used as the sole basis for treatment or other patient management  decisions. A negative result may occur with  improper specimen collection/handling, submission  of specimen other than nasopharyngeal swab, presence of viral mutation(s) within the areas targeted by this assay, and inadequate number of viral copies(<138 copies/mL). A negative result must be combined with clinical observations, patient history, and epidemiological information. The expected result is Negative.  Fact Sheet for Patients:  BloggerCourse.com  Fact Sheet for Healthcare Providers:  SeriousBroker.it  This test is no t yet approved or cleared by the [place] FDA and  has been authorized for detection and/or diagnosis of SARS-CoV-2 by FDA under an Emergency Use Authorization (EUA). This EUA will remain  in effect (meaning this test can be used) for the duration of the COVID-19 declaration under Section 564(b)(1) of the Act, 21 U.S.C.section 360bbb-3(b)(1), unless the authorization is terminated  or revoked sooner.       Influenza A by PCR NEGATIVE NEGATIVE Final   Influenza B by PCR NEGATIVE NEGATIVE Final    Comment: (NOTE) The Xpert Xpress SARS-CoV-2/FLU/RSV plus assay is intended as an aid in the diagnosis of influenza from Nasopharyngeal swab specimens and should not be used as a sole basis for treatment. Nasal washings and aspirates are unacceptable for Xpert Xpress SARS-CoV-2/FLU/RSV testing.  Fact Sheet for Patients: BloggerCourse.com  Fact Sheet for Healthcare Providers: SeriousBroker.it  This test is not yet approved or cleared by the [place] FDA and has been authorized for detection and/or diagnosis of SARS-CoV-2 by FDA under an Emergency Use Authorization (EUA). This EUA will remain in effect (meaning this test can be used) for the duration of the COVID-19 declaration under Section 564(b)(1) of the Act, 21 U.S.C. section 360bbb-3(b)(1), unless the  authorization is terminated or revoked.     Resp Syncytial Virus by PCR NEGATIVE NEGATIVE Final    Comment: (NOTE) Fact Sheet for Patients: BloggerCourse.com  Fact Sheet for Healthcare Providers: SeriousBroker.it  This test is not yet approved or cleared by the [place] FDA and has been authorized for detection and/or diagnosis of SARS-CoV-2 by FDA under an Emergency Use Authorization (EUA). This EUA will remain in effect (meaning this test can be used) for the duration of the COVID-19 declaration under Section 564(b)(1) of the Act, 21 U.S.C. section 360bbb-3(b)(1), unless the authorization is terminated or revoked.  Performed at [facility], [address], [place], [place] [address]   Blood culture (routine x 2)     Status: None (Preliminary result)   Collection Time: 08/05/23  5:22 AM   Specimen: BLOOD  Result Value Ref Range Status   Specimen Description   Final    BLOOD LEFT ANTECUBITAL Performed at [facility], [address], [place], [place] [address]    Special Requests   Final    BOTTLES DRAWN AEROBIC AND ANAEROBIC Blood Culture adequate volume Performed at [facility], [address], [place], [place] [address]    Culture   Final    NO GROWTH 4 DAYS Performed at [facility], [address], [place], [place] [address]    Report Status PENDING  Incomplete  Blood culture (routine x 2)     Status: None (Preliminary result)   Collection Time: 08/05/23  5:42 AM   Specimen: BLOOD  Result Value Ref Range Status   Specimen Description   Final    BLOOD RIGHT ANTECUBITAL Performed at [facility], [address] [place], [place] [address]    Special Requests   Final    BOTTLES DRAWN AEROBIC AND ANAEROBIC Blood Culture adequate volume Performed at [facility], [address], [place], [place] [address]     Culture   Final    NO GROWTH 4 DAYS Performed at [facility], [address], [place], [place] [address]    Report Status PENDING  Incomplete  MRSA Next Gen by PCR, Nasal     Status: None   Collection Time: 08/05/23  2:35 PM   Specimen: Nasal Mucosa; Nasal Swab  Result Value Ref Range Status   MRSA by PCR Next Gen NOT DETECTED NOT DETECTED Final    Comment: (NOTE) The GeneXpert MRSA Assay (FDA approved for NASAL specimens only), is one component of a comprehensive MRSA colonization surveillance program. It is not intended to diagnose MRSA infection nor to guide or monitor treatment for MRSA infections. Test performance is not FDA approved in patients less than 68 years old. Performed at [facility], [address], [place], [place] [address]   Body fluid culture w Gram Stain     Status: None (Preliminary result)   Collection Time: 08/06/23 12:23 PM   Specimen: Pleura; Body Fluid  Result Value Ref Range Status   Specimen Description   Final    PLEURAL Performed at [facility], [address] [place], [place] [address]    Special Requests   Final    Normal Performed at [facility], [address], [place], [place] [address]    Gram Stain   Final    RARE WBC PRESENT,BOTH PMN AND MONONUCLEAR NO ORGANISMS SEEN    Culture   Final    NO GROWTH 2 DAYS Performed at [facility], [address], [place], [place] [address]    Report Status PENDING  Incomplete     Medications:    amoxicillin-clavulanate  1 tablet Oral Q12H   aspirin EC  81 mg Oral Daily   atorvastatin  40 mg Oral QPM   azithromycin  500 mg Oral Daily   feeding supplement  237 mL Oral BID BM   fenofibrate  160 mg Oral Daily   furosemide  20 mg Oral Daily   melatonin  3 mg Oral QHS   pantoprazole  40 mg Oral BID   Continuous Infusions:      LOS: 4 days   Marinda Elk  [facility]  08/09/2023, 9:09 AM

## 2023-08-09 NOTE — Plan of Care (Signed)
Pt retaining urine Problem: Education: Goal: Knowledge of General Education information will improve Description: Including pain rating scale, medication(s)/side effects and non-pharmacologic comfort measures Outcome: Not Progressing   Problem: Health Behavior/Discharge Planning: Goal: Ability to manage health-related needs will improve Outcome: Not Progressing   Problem: Clinical Measurements: Goal: Ability to maintain clinical measurements within normal limits will improve Outcome: Not Progressing Goal: Will remain free from infection Outcome: Not Progressing Goal: Diagnostic test results will improve Outcome: Not Progressing Goal: Respiratory complications will improve Outcome: Not Progressing Goal: Cardiovascular complication will be avoided Outcome: Not Progressing   Problem: Nutrition: Goal: Adequate nutrition will be maintained Outcome: Not Progressing   Problem: Coping: Goal: Level of anxiety will decrease Outcome: Not Progressing

## 2023-08-10 DIAGNOSIS — I214 Non-ST elevation (NSTEMI) myocardial infarction: Secondary | ICD-10-CM | POA: Diagnosis not present

## 2023-08-10 LAB — CULTURE, BLOOD (ROUTINE X 2)
Culture: NO GROWTH
Culture: NO GROWTH
Special Requests: ADEQUATE
Special Requests: ADEQUATE

## 2023-08-10 LAB — CBC
HCT: 27.7 % — ABNORMAL LOW (ref 39.0–52.0)
Hemoglobin: 8.6 g/dL — ABNORMAL LOW (ref 13.0–17.0)
MCH: 29.2 pg (ref 26.0–34.0)
MCHC: 31 g/dL (ref 30.0–36.0)
MCV: 93.9 fL (ref 80.0–100.0)
Platelets: 279 10*3/uL (ref 150–400)
RBC: 2.95 MIL/uL — ABNORMAL LOW (ref 4.22–5.81)
RDW: 16.8 % — ABNORMAL HIGH (ref 11.5–15.5)
WBC: 4.8 10*3/uL (ref 4.0–10.5)
nRBC: 0 % (ref 0.0–0.2)

## 2023-08-10 MED ORDER — AZITHROMYCIN 250 MG PO TABS
500.0000 mg | ORAL_TABLET | Freq: Every day | ORAL | Status: AC
  Administered 2023-08-11: 500 mg via ORAL
  Filled 2023-08-10: qty 2

## 2023-08-10 MED ORDER — AMOXICILLIN-POT CLAVULANATE 875-125 MG PO TABS
1.0000 | ORAL_TABLET | Freq: Two times a day (BID) | ORAL | Status: AC
  Administered 2023-08-10 – 2023-08-11 (×4): 1 via ORAL
  Filled 2023-08-10 (×4): qty 1

## 2023-08-10 MED ORDER — AMOXICILLIN-POT CLAVULANATE 875-125 MG PO TABS: 1.0000 | ORAL_TABLET | Freq: Two times a day (BID) | ORAL | Status: AC

## 2023-08-10 NOTE — Care Management Important Message (Signed)
Important Message  Patient Details IM Letter given. Name: JOHUA ESPEJEL MRN: [id number] Date of Birth: [birth year]   Important Message Given:  Yes - Medicare IM     Caren Macadam 08/10/2023, 11:22 AM

## 2023-08-10 NOTE — Progress Notes (Signed)
[facility] PROGRESS NOTE    Progress Note  Harry Norris  ZOX:[id number] DOB: 01/02/1936 DOA: 08/05/2023 PCP: Paulina Fusi, MD     Brief Narrative:   Harry Norris is an 87 y.o. male past medical history significant for COPD on oxygen as needed, diabetes mellitus type 2 with a hemoglobin A1c of 5.8 in 2024, essential hypertension, chronic combined systolic and diastolic heart failure, chronic kidney see stage IIIb, discharged from: On 08/03/2023 for community-acquired pneumonia and acute kidney injury, who was brought in to the hospital for lower back pain and abdominal pain that started the day prior to admission EMS was called he started complaining of shortness of breath found hypoxic 78% on 2 L.  Was placed on 4 L, troponins in the thousand in the ED was started on IV heparin drip. CT angio of the chest was negative for PE, with improving infiltrates. Hep stop after 48 hrs.  Assessment/Plan:   Elevated troponin I: Twelve-lead EKG showed no acute ST segment changes,  In the setting of CKD IIIb. Likely demand ischemia. Cards agree. Awaiting placement to skilled nursing facility  Acute on chronic respiratory failure with hypoxia possibly due to right-sided pleural effusion: He has been weaned to room air.   Status post thoracocentesis cultures are negative gram stain no organism. To complete a 7-day course in house. Physical therapy evaluated the patient recommended skilled nursing facility.   Acute hypoxic encephalopathy: Likely due to Hypoxia. Now resolved.  Possible dementia without behavioral disturbance: At high risk for aspiration and agitation. Use melatonin at bedtime.  Chronic kidney disease stage IIIb: He was aggressively diuresed during last hospitalization.  His renal function appears to be at baseline. Baseline creatinine is around 1.3-1.6.  Chronic combined systolic and diastolic heart failure: Was continuing oral  Lasix.  Hyperlipidemia: Continue Lipitor and fenofibrate.  Anemia of chronic renal disease: Follow-up PCP as an outpatient.  Controlled diabetes mellitus type 2: With a last A1c of less than 6. Continue sliding scale insulin.  Urinary retention per nursing staff: Voiding trial done, patient is without a Foley.  Possible mild dementia: Will need to be evaluated as an outpatient.  Deconditioning: Patient refused skilled nursing facility in previous admission. Consult PT OT.   DVT prophylaxis: heparin Family Communication:none Status is: Inpatient Remains inpatient appropriate because: acute resp.Failure with hypoxia,     Code Status:     Code Status Orders  (From admission, onward)           Start     Ordered   08/05/23 0844  Do not attempt resuscitation (DNR)- Limited -Do Not Intubate (DNI)  Continuous       Question Answer Comment  If pulseless and not breathing No CPR or chest compressions.   In Pre-Arrest Conditions (Patient Is Breathing and Has A Pulse) Do not intubate. Provide all appropriate non-invasive medical interventions. Avoid ICU transfer unless indicated or required.   Consent: Discussion documented in EHR or advanced directives reviewed      08/05/23 0844           Code Status History     Date Active Date Inactive Code Status Order ID Comments User Context   08/01/2023 1056 08/03/2023 2334 Limited: Do not attempt resuscitation (DNR) -DNR-LIMITED -Do Not Intubate/DNI  [id number]  Burnadette Pop, MD Inpatient   07/15/2023 0951 07/16/2023 2146 Do not attempt resuscitation (DNR) PRE-ARREST INTERVENTIONS DESIRED [id number]  Dorcas Carrow, MD Inpatient   07/15/2023 0212 07/15/2023 0951 Full Code [id number]  Howerter,  Chaney Born, DO Inpatient   07/31/2022 2208 08/02/2022 2037 Full Code [id number]  Anselm Jungling, DO Inpatient   09/26/2021 1746 09/30/2021 0313 DNR [id number]  Cora Collum, DO ED   07/29/2021 1138 08/03/2021 2234 DNR [id number]  Lorin Glass, MD ED      Advance Directive Documentation    Flowsheet Row Most Recent Value  Type of Advance Directive Healthcare Power of Attorney  Pre-existing out of facility DNR order (yellow form or pink MOST form) --  "MOST" Form in Place? --         IV Access:   Peripheral IV   Procedures and diagnostic studies:   No results found.   Medical Consultants:   None.   Subjective:    Harry Norris no complaints  Objective:    Vitals:   08/09/23 1343 08/09/23 2122 08/09/23 2130 08/10/23 0542  BP: (!) 109/53 (!) 67/59 (!) 104/47 (!) 120/49  Pulse: 93 81  80  Resp: 16 18  16   Temp: 98.1 F (36.7 C) 98.1 F (36.7 C)  97.8 F (36.6 C)  TempSrc:  Oral  Oral  SpO2: 100% 95%  94%  Weight:      Height:       SpO2: 94 % O2 Flow Rate (L/min): 4 L/min   Intake/Output Summary (Last 24 hours) at 08/10/2023 0940 Last data filed at 08/09/2023 1006 Gross per 24 hour  Intake 120 ml  Output --  Net 120 ml   Filed Weights   08/05/23 1500  Weight: 62 kg    Exam: General exam: In no acute distress. Respiratory system: Good air movement and clear to auscultation. Cardiovascular system: S1 & S2 heard, RRR. No JVD. Gastrointestinal system: Abdomen is nondistended, soft and nontender.  Extremities: No pedal edema. Skin: No rashes, lesions or ulcers Psychiatry: Judgement and insight appear normal. Mood & affect appropriate.  Data Reviewed:    Labs: Basic Metabolic Panel: Recent Labs  Lab 08/05/23 0341 08/06/23 0041 08/08/23 0533  NA 135 136 137  K 3.8 4.1 3.7  CL 97* 98 98  CO2 30 28 28   GLUCOSE 105* 92 84  BUN 40* 32* 21  CREATININE 1.36* 1.27* 1.22  CALCIUM 8.8* 8.5* 8.8*  MG 1.9  --   --    GFR Estimated Creatinine Clearance: 38.8 mL/min (by C-G formula based on SCr of 1.22 mg/dL). Liver Function Tests: Recent Labs  Lab 08/05/23 0341  AST 22  ALT 18  ALKPHOS 44  BILITOT 0.8  PROT 6.4*  ALBUMIN 2.6*   No results for input(s): "LIPASE",  "AMYLASE" in the last 168 hours. No results for input(s): "AMMONIA" in the last 168 hours. Coagulation profile Recent Labs  Lab 08/05/23 0341  INR 1.1   COVID-19 Labs  No results for input(s): "DDIMER", "FERRITIN", "LDH", "CRP" in the last 72 hours.  Lab Results  Component Value Date   SARSCOV2NAA NEGATIVE 08/05/2023   SARSCOV2NAA NEGATIVE 07/31/2023   SARSCOV2NAA NEGATIVE 07/14/2023   SARSCOV2NAA NEGATIVE 07/31/2022    CBC: Recent Labs  Lab 08/05/23 0341 08/06/23 0041 08/07/23 0255 08/08/23 0533 08/09/23 0504 08/10/23 0442  WBC 5.5 6.0 4.8 3.5* 4.7 4.8  NEUTROABS 3.5  --   --   --   --   --   HGB 9.3* 8.3* 8.7* 8.8* 8.4* 8.6*  HCT 29.1* 26.3* 28.0* 26.6* 26.6* 27.7*  MCV 91.2 93.6 94.0 90.8 92.0 93.9  PLT 393 319 328 309 296 279   Cardiac Enzymes: Recent  Labs  Lab 08/06/23 0041  CKTOTAL 20*   BNP (last 3 results) No results for input(s): "PROBNP" in the last 8760 hours. CBG: No results for input(s): "GLUCAP" in the last 168 hours. D-Dimer: No results for input(s): "DDIMER" in the last 72 hours. Hgb A1c: No results for input(s): "HGBA1C" in the last 72 hours. Lipid Profile: No results for input(s): "CHOL", "HDL", "LDLCALC", "TRIG", "CHOLHDL", "LDLDIRECT" in the last 72 hours. Thyroid function studies: No results for input(s): "TSH", "T4TOTAL", "T3FREE", "THYROIDAB" in the last 72 hours.  Invalid input(s): "FREET3" Anemia work up: No results for input(s): "VITAMINB12", "FOLATE", "FERRITIN", "TIBC", "IRON", "RETICCTPCT" in the last 72 hours. Sepsis Labs: Recent Labs  Lab 08/05/23 0341 08/05/23 0522 08/06/23 0041 08/07/23 0255 08/08/23 0533 08/09/23 0504 08/10/23 0442  WBC 5.5  --    < > 4.8 3.5* 4.7 4.8  LATICACIDVEN 1.1 0.9  --   --   --   --   --    < > = values in this interval not displayed.   Microbiology Recent Results (from the past 240 hour(s))  Culture, blood (routine x 2)     Status: None   Collection Time: 07/31/23  5:39 PM    Specimen: BLOOD  Result Value Ref Range Status   Specimen Description   Final    BLOOD LEFT ANTECUBITAL Performed at [facility], [address] [place], [place] [address]    Special Requests   Final    BOTTLES DRAWN AEROBIC AND ANAEROBIC Blood Culture results may not be optimal due to an inadequate volume of blood received in culture bottles Performed at [facility], [address] [place], [place] [address]    Culture   Final    NO GROWTH 5 DAYS Performed at [facility], [address], [place], [place] [address]    Report Status 08/06/2023 FINAL  Final  Culture, blood (routine x 2)     Status: None   Collection Time: 07/31/23  5:45 PM   Specimen: BLOOD  Result Value Ref Range Status   Specimen Description   Final    BLOOD BLOOD RIGHT WRIST Performed at [facility], [address] [place], [place] [address]    Special Requests   Final    BOTTLES DRAWN AEROBIC AND ANAEROBIC Blood Culture adequate volume Performed at [facility], [address] [place], [place] [address]    Culture   Final    NO GROWTH 5 DAYS Performed at [facility], [address], [place], [place] [address]    Report Status 08/06/2023 FINAL  Final  Resp panel by RT-PCR (RSV, Flu A&B, Covid) Anterior Nasal Swab     Status: None   Collection Time: 07/31/23  5:50 PM   Specimen: Anterior Nasal Swab  Result Value Ref Range Status   SARS Coronavirus 2 by RT PCR NEGATIVE NEGATIVE Final    Comment: (NOTE) SARS-CoV-2 target nucleic acids are NOT DETECTED.  The SARS-CoV-2 RNA is generally detectable in upper respiratory specimens during the acute phase of infection. The lowest concentration of SARS-CoV-2 viral copies this assay can detect is 138 copies/mL. A negative result does not preclude SARS-Cov-2 infection and should not be used as the sole basis for treatment or other patient management decisions. A negative result may occur with  improper  specimen collection/handling, submission of specimen other than nasopharyngeal swab, presence of viral mutation(s) within the areas targeted by  this assay, and inadequate number of viral copies(<138 copies/mL). A negative result must be combined with clinical observations, patient history, and epidemiological information. The expected result is Negative.  Fact Sheet for Patients:  BloggerCourse.com  Fact Sheet for Healthcare Providers:  SeriousBroker.it  This test is no t yet approved or cleared by the [place] FDA and  has been authorized for detection and/or diagnosis of SARS-CoV-2 by FDA under an Emergency Use Authorization (EUA). This EUA will remain  in effect (meaning this test can be used) for the duration of the COVID-19 declaration under Section 564(b)(1) of the Act, 21 U.S.C.section 360bbb-3(b)(1), unless the authorization is terminated  or revoked sooner.       Influenza A by PCR NEGATIVE NEGATIVE Final   Influenza B by PCR NEGATIVE NEGATIVE Final    Comment: (NOTE) The Xpert Xpress SARS-CoV-2/FLU/RSV plus assay is intended as an aid in the diagnosis of influenza from Nasopharyngeal swab specimens and should not be used as a sole basis for treatment. Nasal washings and aspirates are unacceptable for Xpert Xpress SARS-CoV-2/FLU/RSV testing.  Fact Sheet for Patients: BloggerCourse.com  Fact Sheet for Healthcare Providers: SeriousBroker.it  This test is not yet approved or cleared by the [place] FDA and has been authorized for detection and/or diagnosis of SARS-CoV-2 by FDA under an Emergency Use Authorization (EUA). This EUA will remain in effect (meaning this test can be used) for the duration of the COVID-19 declaration under Section 564(b)(1) of the Act, 21 U.S.C. section 360bbb-3(b)(1), unless the authorization is terminated or revoked.     Resp  Syncytial Virus by PCR NEGATIVE NEGATIVE Final    Comment: (NOTE) Fact Sheet for Patients: BloggerCourse.com  Fact Sheet for Healthcare Providers: SeriousBroker.it  This test is not yet approved or cleared by the [place] FDA and has been authorized for detection and/or diagnosis of SARS-CoV-2 by FDA under an Emergency Use Authorization (EUA). This EUA will remain in effect (meaning this test can be used) for the duration of the COVID-19 declaration under Section 564(b)(1) of the Act, 21 U.S.C. section 360bbb-3(b)(1), unless the authorization is terminated or revoked.  Performed at [facility], [address] [place], [place] [address]   MRSA Next Gen by PCR, Nasal     Status: None   Collection Time: 07/31/23  9:41 PM   Specimen: Nasal Mucosa; Nasal Swab  Result Value Ref Range Status   MRSA by PCR Next Gen NOT DETECTED NOT DETECTED Final    Comment: (NOTE) The GeneXpert MRSA Assay (FDA approved for NASAL specimens only), is one component of a comprehensive MRSA colonization surveillance program. It is not intended to diagnose MRSA infection nor to guide or monitor treatment for MRSA infections. Test performance is not FDA approved in patients less than 32 years old. Performed at [facility], [address], [place], [place] [address]   Resp panel by RT-PCR (RSV, Flu A&B, Covid) Anterior Nasal Swab     Status: None   Collection Time: 08/05/23  3:20 AM   Specimen: Anterior Nasal Swab  Result Value Ref Range Status   SARS Coronavirus 2 by RT PCR NEGATIVE NEGATIVE Final    Comment: (NOTE) SARS-CoV-2 target nucleic acids are NOT DETECTED.  The SARS-CoV-2 RNA is generally detectable in upper respiratory specimens during the acute phase of infection. The lowest concentration of SARS-CoV-2 viral copies this assay can detect is 138 copies/mL. A negative result does not preclude SARS-Cov-2 infection and  should not be used  as the sole basis for treatment or other patient management decisions. A negative result may occur with  improper specimen collection/handling, submission of specimen other than nasopharyngeal swab, presence of viral mutation(s) within the areas targeted by this assay, and inadequate number of viral copies(<138 copies/mL). A negative result must be combined with clinical observations, patient history, and epidemiological information. The expected result is Negative.  Fact Sheet for Patients:  BloggerCourse.com  Fact Sheet for Healthcare Providers:  SeriousBroker.it  This test is no t yet approved or cleared by the [place] FDA and  has been authorized for detection and/or diagnosis of SARS-CoV-2 by FDA under an Emergency Use Authorization (EUA). This EUA will remain  in effect (meaning this test can be used) for the duration of the COVID-19 declaration under Section 564(b)(1) of the Act, 21 U.S.C.section 360bbb-3(b)(1), unless the authorization is terminated  or revoked sooner.       Influenza A by PCR NEGATIVE NEGATIVE Final   Influenza B by PCR NEGATIVE NEGATIVE Final    Comment: (NOTE) The Xpert Xpress SARS-CoV-2/FLU/RSV plus assay is intended as an aid in the diagnosis of influenza from Nasopharyngeal swab specimens and should not be used as a sole basis for treatment. Nasal washings and aspirates are unacceptable for Xpert Xpress SARS-CoV-2/FLU/RSV testing.  Fact Sheet for Patients: BloggerCourse.com  Fact Sheet for Healthcare Providers: SeriousBroker.it  This test is not yet approved or cleared by the [place] FDA and has been authorized for detection and/or diagnosis of SARS-CoV-2 by FDA under an Emergency Use Authorization (EUA). This EUA will remain in effect (meaning this test can be used) for the duration of the COVID-19 declaration  under Section 564(b)(1) of the Act, 21 U.S.C. section 360bbb-3(b)(1), unless the authorization is terminated or revoked.     Resp Syncytial Virus by PCR NEGATIVE NEGATIVE Final    Comment: (NOTE) Fact Sheet for Patients: BloggerCourse.com  Fact Sheet for Healthcare Providers: SeriousBroker.it  This test is not yet approved or cleared by the [place] FDA and has been authorized for detection and/or diagnosis of SARS-CoV-2 by FDA under an Emergency Use Authorization (EUA). This EUA will remain in effect (meaning this test can be used) for the duration of the COVID-19 declaration under Section 564(b)(1) of the Act, 21 U.S.C. section 360bbb-3(b)(1), unless the authorization is terminated or revoked.  Performed at [facility], [address], [place], [place] [address]   Blood culture (routine x 2)     Status: None   Collection Time: 08/05/23  5:22 AM   Specimen: BLOOD  Result Value Ref Range Status   Specimen Description   Final    BLOOD LEFT ANTECUBITAL Performed at [facility], [address], [place], [place] [address]    Special Requests   Final    BOTTLES DRAWN AEROBIC AND ANAEROBIC Blood Culture adequate volume Performed at [facility], [address], [place], [place] [address]    Culture   Final    NO GROWTH 5 DAYS Performed at [facility], [address] [place], [place] [address]    Report Status 08/10/2023 FINAL  Final  Blood culture (routine x 2)     Status: None   Collection Time: 08/05/23  5:42 AM   Specimen: BLOOD  Result Value Ref Range Status   Specimen Description   Final    BLOOD RIGHT ANTECUBITAL Performed at [facility], [address], [place], [place] [address]    Special  Requests   Final    BOTTLES DRAWN AEROBIC AND ANAEROBIC Blood Culture adequate volume Performed at [facility], [address], [place], [place] [address]    Culture   Final    NO GROWTH 5 DAYS Performed at [facility], [address] [place], [place] [address]    Report Status 08/10/2023 FINAL  Final  MRSA Next Gen by PCR, Nasal     Status: None   Collection Time: 08/05/23  2:35 PM   Specimen: Nasal Mucosa; Nasal Swab  Result Value Ref Range Status   MRSA by PCR Next Gen NOT DETECTED NOT DETECTED Final    Comment: (NOTE) The GeneXpert MRSA Assay (FDA approved for NASAL specimens only), is one component of a comprehensive MRSA colonization surveillance program. It is not intended to diagnose MRSA infection nor to guide or monitor treatment for MRSA infections. Test performance is not FDA approved in patients less than 74 years old. Performed at [facility], [address] [place], [place] [address]   Body fluid culture w Gram Stain     Status: None   Collection Time: 08/06/23 12:23 PM   Specimen: Pleura; Body Fluid  Result Value Ref Range Status   Specimen Description   Final    PLEURAL Performed at [facility], [address] [place], [place] [address]    Special Requests   Final    Normal Performed at [facility], [address] [place], [place] [address]    Gram Stain   Final    RARE WBC PRESENT,BOTH PMN AND MONONUCLEAR NO ORGANISMS SEEN    Culture   Final    NO GROWTH 3 DAYS Performed at [facility], [address] [place], [place] [address]    Report Status 08/09/2023 FINAL  Final     Medications:    aspirin EC  81 mg Oral Daily   atorvastatin  40 mg Oral QPM   azithromycin  500 mg Oral Daily   feeding supplement  237 mL Oral BID BM   fenofibrate  160 mg Oral Daily   furosemide  20 mg Oral Daily   melatonin  3 mg Oral QHS   pantoprazole  40 mg Oral BID   Continuous Infusions:      LOS: 5 days   Marinda Elk  [facility]  08/10/2023, 9:40 AM

## 2023-08-10 NOTE — Plan of Care (Signed)
  Problem: Activity: Goal: Risk for activity intolerance will decrease Outcome: Progressing   Problem: Nutrition: Goal: Adequate nutrition will be maintained Outcome: Progressing   Problem: Coping: Goal: Level of anxiety will decrease Outcome: Progressing   Problem: Clinical Measurements: Goal: Ability to maintain clinical measurements within normal limits will improve Outcome: Progressing Goal: Will remain free from infection Outcome: Progressing Goal: Diagnostic test results will improve Outcome: Progressing Goal: Respiratory complications will improve Outcome: Progressing Goal: Cardiovascular complication will be avoided Outcome: Progressing

## 2023-08-10 NOTE — Progress Notes (Signed)
Physical Therapy Treatment Patient Details Name: Harry Norris MRN: [id number] DOB: 01/29/1936 Today's Date: 08/10/2023   History of Present Illness Patient is a 87 year old male who presented to the hospital on 10/5 with low back and abdominal pain. Patient was admitted with elevated troponin, acute on chronic respiratory failure with hypoxia and R side pleural effusion, acute hypoxic encephalopathy, PMH: recent d/c from hospital on 12/3 with CAP and AKI, dementia, CKDIII, hyperlipidemia, DM II, urinary retention, COPD on O2,    PT Comments  Pt agreeable to working with therapy. Assessed BP during session: seated-108/55, standing-95/64, after walking 60 feet-107/53. Pt denied dizziness. Tolerated walk well but did report some fatigue. Assisted pt back into recliner. Patient will benefit from continued inpatient follow up therapy, <3 hours/day     If plan is discharge home, recommend the following: A little help with walking and/or transfers;Help with stairs or ramp for entrance;Assist for transportation;Assistance with cooking/housework;A little help with bathing/dressing/bathroom   Can travel by private vehicle     Yes  Equipment Recommendations  None recommended by PT    Recommendations for Other Services       Precautions / Restrictions Precautions Precautions: Fall Precaution Comments: incontinent B/B Restrictions Weight Bearing Restrictions: No     Mobility  Bed Mobility               General bed mobility comments: oob in recliner    Transfers Overall transfer level: Needs assistance Equipment used: Rolling walker (2 wheels) Transfers: Sit to/from Stand Sit to Stand: Contact guard assist           General transfer comment: Cues for safety. Increased time.    Ambulation/Gait Ambulation/Gait assistance: Min assist Gait Distance (Feet): 60 Feet Assistive device: Rolling walker (2 wheels) Gait Pattern/deviations: Step-through pattern, Decreased stride  length, Trunk flexed       General Gait Details: Intermittent assist to steady. Cues for safety, RW proximity. Short distance today so I could check his BP after walking   Stairs             Wheelchair Mobility     Tilt Bed    Modified Rankin (Stroke Patients Only)       Balance Overall balance assessment: Needs assistance         Standing balance support: Bilateral upper extremity supported, Reliant on assistive device for balance Standing balance-Leahy Scale: Poor                              Cognition Arousal: Alert Behavior During Therapy: Flat affect Overall Cognitive Status: History of cognitive impairments - at baseline                         Following Commands: Follows one step commands consistently       General Comments: follows commands well.        Exercises      General Comments        Pertinent Vitals/Pain Pain Assessment Pain Assessment: No/denies pain    Home Living                          Prior Function            PT Goals (current goals can now be found in the care plan section) Progress towards PT goals: Progressing toward goals    Frequency  Min 1X/week      PT Plan      Co-evaluation              AM-PAC PT "6 Clicks" Mobility   Outcome Measure  Help needed turning from your back to your side while in a flat bed without using bedrails?: A Little Help needed moving from lying on your back to sitting on the side of a flat bed without using bedrails?: A Little Help needed moving to and from a bed to a chair (including a wheelchair)?: A Little Help needed standing up from a chair using your arms (e.g., wheelchair or bedside chair)?: A Little Help needed to walk in hospital room?: A Little Help needed climbing 3-5 steps with a railing? : A Lot 6 Click Score: 17    End of Session Equipment Utilized During Treatment: Gait belt Activity Tolerance: Patient tolerated  treatment well Patient left: in chair;with call bell/phone within reach;with chair alarm set   PT Visit Diagnosis: Unsteadiness on feet (R26.81);Muscle weakness (generalized) (M62.81);Difficulty in walking, not elsewhere classified (R26.2)     Time: [id number] PT Time Calculation (min) (ACUTE ONLY): 16 min  Charges:    $Gait Training: 8-22 mins PT General Charges $$ ACUTE PT VISIT: 1 Visit                         Faye Ramsay, PT Acute Rehabilitation  Office: [phone]

## 2023-08-10 NOTE — Progress Notes (Signed)
   08/09/23 2122  Assess: MEWS Score  Temp 98.1 F (36.7 C)  BP (!) 67/59  MAP (mmHg) (!) 63  Pulse Rate 81  Resp 18  Level of Consciousness Alert  SpO2 95 %  O2 Device Room Air  Assess: MEWS Score  MEWS Temp 0  MEWS Systolic 3  MEWS Pulse 0  MEWS RR 0  MEWS LOC 0  MEWS Score 3  MEWS Score Color Yellow  Assess: if the MEWS score is Yellow or Red  Were vital signs accurate and taken at a resting state? Yes  Does the patient meet 2 or more of the SIRS criteria? No  MEWS guidelines implemented  Yes, yellow  Treat  MEWS Interventions Considered administering scheduled or prn medications/treatments as ordered  Take Vital Signs  Increase Vital Sign Frequency  Yellow: Q2hr x1, continue Q4hrs until patient remains green for 12hrs  Escalate  MEWS: Escalate Yellow: Discuss with charge nurse and consider notifying provider and/or RRT  Notify: Charge Nurse/RN  Name of Charge Nurse/RN Notified Publishing copy  Provider Notification  Provider Name/Title J.,DANIELs  Date Provider Notified 08/09/23  Time Provider Notified 2128  Method of Notification Page  Notification Reason Other (Comment)  Provider response No new orders  Date of Provider Response 08/09/23  Time of Provider Response 2130  Notify: Rapid Response  Name of Rapid Response RN Notified na  Assess: SIRS CRITERIA  SIRS Temperature  0  SIRS Pulse 0  SIRS Respirations  0  SIRS WBC 0  SIRS Score Sum  0   BP retaken with a smaller cuff pt no longer in a yellow MEWS. Placed in bed and elevated legs. Will continue to monitor.

## 2023-08-10 NOTE — Plan of Care (Signed)
  Problem: Education: Goal: Knowledge of General Education information will improve Description Including pain rating scale, medication(s)/side effects and non-pharmacologic comfort measures Outcome: Progressing   Problem: Health Behavior/Discharge Planning: Goal: Ability to manage health-related needs will improve Outcome: Progressing   

## 2023-08-10 NOTE — TOC Progression Note (Addendum)
Transition of Care [facility]) - Progression Note    Patient Details  Name: Harry Norris MRN: [id number] Date of Birth: 12/01/1935  [facility] CM/SW Contact  Otelia Santee, LCSW Phone Number: 08/10/2023, 9:03 AM  Clinical Narrative:    Insurance authorization still pending. Will continue to check status throughout the day.    ADDENDUM: Ins auth still pending. Assisted pt in completing interview with [facility] for placement at their facility. State FL2 form complete and emailed to [employer].   Expected Discharge Plan: Skilled Nursing Facility Barriers to Discharge: Continued Medical Work up  Expected Discharge Plan and Services   Discharge Planning Services: CM Consult   Living arrangements for the past 2 months: Single Family Home Expected Discharge Date: 08/09/23                                     Social Determinants of Health (SDOH) Interventions SDOH Screenings   Food Insecurity: No Food Insecurity (08/06/2023)  Housing: Low Risk  (08/06/2023)  Transportation Needs: No Transportation Needs (08/06/2023)  Utilities: Not At Risk (08/06/2023)  Depression (PHQ2-9): Low Risk  (08/07/2021)  Tobacco Use: Medium Risk (08/05/2023)    Readmission Risk Interventions    08/06/2023    2:21 PM 08/02/2022    2:14 PM 09/29/2021    5:07 PM  Readmission Risk Prevention Plan  Transportation Screening Complete Complete Complete  PCP or Specialist Appt within 3-5 Days  Complete   HRI or Home Care Consult  Complete Not Complete  HRI or Home Care Consult comments   Pending son's decision about 24 hr homecare v/s ALF  Social Work Consult for Recovery Care Planning/Counseling  Complete Not Complete  SW consult not completed comments   Pending son's decision about 24 hr homecare v/s ALF  Palliative Care Screening  Not Applicable Not Applicable  Medication Review (RN Care Manager) Complete Complete Complete  PCP or Specialist appointment within 3-5 days of discharge Complete     HRI or Home Care Consult Complete    SW Recovery Care/Counseling Consult Complete    Palliative Care Screening Not Applicable    Skilled Nursing Facility Not Applicable

## 2023-08-11 DIAGNOSIS — J9621 Acute and chronic respiratory failure with hypoxia: Secondary | ICD-10-CM | POA: Diagnosis not present

## 2023-08-11 DIAGNOSIS — I5022 Chronic systolic (congestive) heart failure: Secondary | ICD-10-CM | POA: Diagnosis not present

## 2023-08-11 DIAGNOSIS — I2489 Other forms of acute ischemic heart disease: Secondary | ICD-10-CM | POA: Diagnosis not present

## 2023-08-11 DIAGNOSIS — I214 Non-ST elevation (NSTEMI) myocardial infarction: Secondary | ICD-10-CM | POA: Diagnosis not present

## 2023-08-11 LAB — CBC WITH DIFFERENTIAL/PLATELET
Abs Immature Granulocytes: 0.03 10*3/uL (ref 0.00–0.07)
Basophils Absolute: 0 10*3/uL (ref 0.0–0.1)
Basophils Relative: 0 %
Eosinophils Absolute: 0.1 10*3/uL (ref 0.0–0.5)
Eosinophils Relative: 2 %
HCT: 31.3 % — ABNORMAL LOW (ref 39.0–52.0)
Hemoglobin: 9.6 g/dL — ABNORMAL LOW (ref 13.0–17.0)
Immature Granulocytes: 1 %
Lymphocytes Relative: 31 %
Lymphs Abs: 1.5 10*3/uL (ref 0.7–4.0)
MCH: 29.4 pg (ref 26.0–34.0)
MCHC: 30.7 g/dL (ref 30.0–36.0)
MCV: 96 fL (ref 80.0–100.0)
Monocytes Absolute: 0.3 10*3/uL (ref 0.1–1.0)
Monocytes Relative: 6 %
Neutro Abs: 3 10*3/uL (ref 1.7–7.7)
Neutrophils Relative %: 60 %
Platelets: 298 10*3/uL (ref 150–400)
RBC: 3.26 MIL/uL — ABNORMAL LOW (ref 4.22–5.81)
RDW: 16.7 % — ABNORMAL HIGH (ref 11.5–15.5)
WBC: 4.9 10*3/uL (ref 4.0–10.5)
nRBC: 0 % (ref 0.0–0.2)

## 2023-08-11 LAB — COMPREHENSIVE METABOLIC PANEL
ALT: 17 U/L (ref 0–44)
AST: 22 U/L (ref 15–41)
Albumin: 2.9 g/dL — ABNORMAL LOW (ref 3.5–5.0)
Alkaline Phosphatase: 42 U/L (ref 38–126)
Anion gap: 11 (ref 5–15)
BUN: 19 mg/dL (ref 8–23)
CO2: 26 mmol/L (ref 22–32)
Calcium: 8.8 mg/dL — ABNORMAL LOW (ref 8.9–10.3)
Chloride: 96 mmol/L — ABNORMAL LOW (ref 98–111)
Creatinine, Ser: 1.29 mg/dL — ABNORMAL HIGH (ref 0.61–1.24)
GFR, Estimated: 54 mL/min — ABNORMAL LOW (ref >60)
Glucose, Bld: 86 mg/dL (ref 70–99)
Potassium: 4 mmol/L (ref 3.5–5.1)
Sodium: 133 mmol/L — ABNORMAL LOW (ref 135–145)
Total Bilirubin: 0.7 mg/dL (ref <1.2)
Total Protein: 6.6 g/dL (ref 6.5–8.1)

## 2023-08-11 LAB — MAGNESIUM: Magnesium: 1.9 mg/dL (ref 1.7–2.4)

## 2023-08-11 LAB — PHOSPHORUS: Phosphorus: 3.2 mg/dL (ref 2.5–4.6)

## 2023-08-11 MED ORDER — LOPERAMIDE HCL 2 MG PO CAPS
2.0000 mg | ORAL_CAPSULE | ORAL | Status: DC | PRN
  Administered 2023-08-11: 2 mg via ORAL
  Filled 2023-08-11: qty 1

## 2023-08-11 MED ORDER — GUAIFENESIN-DM 100-10 MG/5ML PO SYRP: 5.0000 mL | ORAL_SOLUTION | ORAL | Status: AC | PRN

## 2023-08-11 MED ORDER — LOPERAMIDE HCL 2 MG PO CAPS: 2.0000 mg | ORAL_CAPSULE | ORAL | Status: AC | PRN

## 2023-08-11 NOTE — Plan of Care (Signed)
  Problem: Education: Goal: Knowledge of General Education information will improve Description: Including pain rating scale, medication(s)/side effects and non-pharmacologic comfort measures Outcome: Progressing   Problem: Clinical Measurements: Goal: Ability to maintain clinical measurements within normal limits will improve Outcome: Progressing Goal: Will remain free from infection Outcome: Progressing   Problem: Nutrition: Goal: Adequate nutrition will be maintained Outcome: Progressing   Problem: Pain Management: Goal: General experience of comfort will improve Outcome: Progressing   Problem: Safety: Goal: Ability to remain free from injury will improve Outcome: Progressing   Problem: Nutrition Goal: Nutritional status is improving Description: Monitor and assess patient for malnutrition (ex- brittle hair, bruises, dry skin, pale skin and conjunctiva, muscle wasting, smooth red tongue, and disorientation). Collaborate with interdisciplinary team and initiate plan and interventions as ordered.  Monitor patient's weight and dietary intake as ordered or per policy. Utilize nutrition screening tool and intervene per policy. Determine patient's food preferences and provide high-protein, high-caloric foods as appropriate.  Outcome: Progressing

## 2023-08-11 NOTE — Plan of Care (Signed)
  Problem: Education: Goal: Knowledge of General Education information will improve Description: Including pain rating scale, medication(s)/side effects and non-pharmacologic comfort measures Outcome: Progressing   Problem: Health Behavior/Discharge Planning: Goal: Ability to manage health-related needs will improve Outcome: Progressing   Problem: Clinical Measurements: Goal: Ability to maintain clinical measurements within normal limits will improve Outcome: Progressing Goal: Will remain free from infection Outcome: Progressing Goal: Diagnostic test results will improve Outcome: Progressing Goal: Respiratory complications will improve Outcome: Progressing Goal: Cardiovascular complication will be avoided Outcome: Progressing   Problem: Activity: Goal: Risk for activity intolerance will decrease Outcome: Progressing   Problem: Nutrition: Goal: Adequate nutrition will be maintained Outcome: Progressing   Problem: Coping: Goal: Level of anxiety will decrease Outcome: Progressing   Problem: Elimination: Goal: Will not experience complications related to bowel motility Outcome: Progressing Goal: Will not experience complications related to urinary retention Outcome: Progressing   Problem: Pain Management: Goal: General experience of comfort will improve Outcome: Progressing   Problem: Safety: Goal: Ability to remain free from injury will improve Outcome: Progressing   Problem: Skin Integrity: Goal: Risk for impaired skin integrity will decrease Outcome: Progressing   Problem: Nutrition Goal: Nutritional status is improving Description: Monitor and assess patient for malnutrition (ex- brittle hair, bruises, dry skin, pale skin and conjunctiva, muscle wasting, smooth red tongue, and disorientation). Collaborate with interdisciplinary team and initiate plan and interventions as ordered.  Monitor patient's weight and dietary intake as ordered or per policy. Utilize  nutrition screening tool and intervene per policy. Determine patient's food preferences and provide high-protein, high-caloric foods as appropriate.  Outcome: Progressing

## 2023-08-11 NOTE — Discharge Summary (Signed)
Physician Discharge Summary   Patient: Harry Norris MRN: [id number] DOB: December 15, 1935  Admit date:     08/05/2023  Discharge date: 08/11/23  Discharge Physician: Marguerita Merles, DO   PCP: Paulina Fusi, MD   Recommendations at discharge:   Follow-up with PCP within 1 to 2 weeks and repeat CBC, CMP, mag, Phos within 1 week Follow-up with Cardiology outpatient setting and have GDMT titrated outpatient setting Follow-up with Neurology outpatient setting for outpatient evaluation for dementia evaluation Repeat Chest X-ray in 3 to 6 weeks  Discharge Diagnoses: Principal Problem:   NSTEMI (non-ST elevated myocardial infarction) (HCC) Active Problems:   Elevated troponin level not due myocardial infarction   Demand ischemia (HCC)   Acute on chronic hypoxic respiratory failure (HCC)   Chronic HFrEF (heart failure with reduced ejection fraction) (HCC)   Diabetes mellitus type 2, noninsulin dependent (HCC)  Resolved Problems:   * No resolved hospital problems. *  Hospital Course: Harry Norris is an 87 y.o. male past medical history significant for COPD on oxygen as needed, diabetes mellitus type 2 with a hemoglobin A1c of 5.8 in 2024, essential hypertension, chronic combined systolic and diastolic heart failure, chronic kidney see stage IIIb, discharged from: On 08/03/2023 for community-acquired pneumonia and acute kidney injury, who was brought in to the hospital for lower back pain and abdominal pain that started the day prior to admission EMS was called he started complaining of shortness of breath found hypoxic 78% on 2 L.  Was placed on 4 L, troponins in the thousand in the ED was started on IV heparin drip. CT angio of the chest was negative for PE, with improving infiltrates. Hep stop after 48 hrs. improved significantly and is doing quite well and is medically stable for discharge at this time to SNF and will need to follow-up within 1 to 2 weeks with PCP, cardiology outpatient  neurology and will also need a repeat chest x-ray in 3 to 6 weeks  Assessment and Plan:  Elevated Troponin I and NSTEMI Type II -Twelve-lead EKG showed no acute ST segment changes and cardiology feels that this is demand ischemia and not type I physiology -Troponin I went from 1081 -> 1018 -> 794 -> 798 -In the setting of CKD IIIb. -Likely demand ischemia and Cardiology agreed -Heparin gtt stopped  -C/w ASA 81 mg po Daily, Atorvastatin 40 mg po qHS and cardiology deferring beta-blocker at this time and recommended continuing IV heparin drip for 48 hours and stopping -Currently Awaiting placement to skilled nursing facility -Repeat chest x-ray in 3 to 6 weeks   Acute on Chronic Respiratory Failure with Hypoxia possibly due to Right-Sided Pleural Effusion -He has been weaned to room air.   -Status post thoracocentesis cultures are negative gram stain no organism. -To complete a 7-day course of Abx and now on Augmentin 875-125 mg per tab 1 tab q12h -Not in any Respiratory Distress -C/w Albuterol 2.5 mg Neb q2hprn Wheezing and Guaifenesin-Dextromethorphan 5 mL po q4hprn Cough/Chest Congestion  -Physical Therapy evaluated the patient recommended skilled nursing facility.    Acute Hypoxic Encephalopathy: -Likely due to Hypoxia. SpO2: 94 % O2 Flow Rate (L/min): 4 L/min; now weaned of Supplemental O2 via [name] -Now resolved.   Possible Mild Dementia without Behavioral Disturbance: -At high risk for aspiration and agitation. -Use melatonin at bedtime. -Will need to be evaluated as an outpatient with PCP and Neurology    Chronic kidney disease stage IIIb: -He was aggressively diuresed during last hospitalization.   -  Baseline creatinine is around 1.3-1.6. His renal function appears to be at baseline. -BUN/Cr Trend: Recent Labs  Lab 08/01/23 0826 08/02/23 0831 08/03/23 0652 08/05/23 0341 08/06/23 0041 08/08/23 0533 08/11/23 0910  BUN 17 22 31* 40* 32* 21 19  CREATININE 1.42* 1.55*  1.91* 1.36* 1.27* 1.22 1.29*  -Avoid Nephrotoxic Medications, Contrast Dyes, Hypotension and Dehydration to Ensure Adequate Renal Perfusion and will need to Renally Adjust Meds -Continue to Monitor and Trend Renal Function carefully and repeat CMP within 1 week   Chronic Combined Systolic and Diastolic Heart Failure with EF of 20-25% -Strict I's and O's and Daily Weights; No intake or output data in the 24 hours ending 08/11/23 1242  -C/w Furosemide 20 mg po daily -Cardiology recommending titrating GDMT once he is more stable blood pressure allows and following up in outpatient setting and eventually initiating SGLT2 inhibitor once he is more stable -He was on losartan at 1 point was discontinued and can be resumed in outpatient setting -Continue to Monitor for S/Sx of Volume Overload  Hyponatremia -Mild. Na+ Trend: Recent Labs  Lab 08/01/23 0826 08/02/23 0831 08/03/23 0652 08/05/23 0341 08/06/23 0041 08/08/23 0533 08/11/23 0910  NA 133* 135 133* 135 136 137 133*  -Continue to Monitor and Trend and Repeat CMP within 1 week   Hyperlipidemia -Continue Atorvastatin 40 mg po Daily and Fenofibrate 160 mg po Daily    Anemia of Chronic Renal Disease Follow-up PCP as an outpatient. -Hgb/Hct Trend: Recent Labs  Lab 08/05/23 0341 08/06/23 0041 08/07/23 0255 08/08/23 0533 08/09/23 0504 08/10/23 0442 08/11/23 0910  HGB 9.3* 8.3* 8.7* 8.8* 8.4* 8.6* 9.6*  HCT 29.1* 26.3* 28.0* 26.6* 26.6* 27.7* 31.3*  MCV 91.2 93.6 94.0 90.8 92.0 93.9 96.0  -Check Anemia Panel in the outpatient setting -Continue to Monitor for S/Sx of Bleeding; No overt bleeding noted -Repeat CBC within 1 week   Controlled Diabetes Mellitus Type 2: -With a last A1c of less than 6. -Continued SSI whiled hospitalized if necessary  -CBG Trend:  Recent Labs  Lab 07/14/23 2138 07/15/23 0735 07/15/23 1151 07/15/23 1745 07/15/23 2131 07/16/23 0736 07/16/23 1217  GLUCAP 73 95 121* 109* 108* 77 119*   Urinary  Retention per nursing staff: -Voiding trial done, patient is without a Foley.  Diarrhea -Likely noninfectious given that he is afebrile with no white-in the setting of antibiotics and will continue Imodium if necessary -Continue monitor carefully and should improve once his antibiotics are discontinued after tomorrow    Deconditioning: -Patient refused skilled nursing facility in previous admission. -Consult PT OT and recommending SNF and agreeable and is stable to D/C  GERD/GI Prophylaxis  -C/w Pantoprazole 40 mg po BID  Hypoalbuminemia -Patient's Albumin Trend: Recent Labs  Lab 07/14/23 2136 07/15/23 0529 08/05/23 0341 08/11/23 0910  ALBUMIN 3.6 3.3* 2.6* 2.9*  -Continue to Monitor and Trend and repeat CMP in the AM   Active Pressure Injury/Wound(s)     Pressure Ulcer  Duration          Pressure Injury 09/26/21 Buttocks Left Deep Tissue Pressure Injury - Purple or maroon localized area of discolored intact skin or blood-filled blister due to damage of underlying soft tissue from pressure and/or shear. 683 days           Consultants: Cardiology Procedures performed:  Successful ultrasound guided diagnostic and therapeutic right thoracentesis yielding 420 cc of pleural fluid. Disposition: Skilled nursing facility Diet recommendation:  Discharge Diet Orders (From admission, onward)     Start  Ordered   08/09/23 0000  Diet - low sodium heart healthy        08/09/23 0918           Cardiac diet DISCHARGE MEDICATION: Allergies as of 08/11/2023       Reactions   Ace Inhibitors Cough        Medication List     STOP taking these medications    amoxicillin-clavulanate 500-125 MG tablet Commonly known as: Augmentin Replaced by: amoxicillin-clavulanate 875-125 MG tablet   ascorbic acid 500 MG tablet Commonly known as: VITAMIN C   guaiFENesin 600 MG 12 hr tablet Commonly known as: MUCINEX       TAKE these medications    acetaminophen 325 MG  tablet Commonly known as: TYLENOL Take 2 tablets (650 mg total) by mouth every 6 (six) hours as needed for mild pain (pain score 1-3) (or Fever >/= 101).   albuterol 108 (90 Base) MCG/ACT inhaler Commonly known as: VENTOLIN HFA Inhale 2 puffs into the lungs every 6 (six) hours as needed for wheezing or shortness of breath.   amoxicillin-clavulanate 875-125 MG tablet Commonly known as: AUGMENTIN Take 1 tablet by mouth every 12 (twelve) hours for 2 days. Replaces: amoxicillin-clavulanate 500-125 MG tablet   aspirin EC 81 MG tablet Take 81 mg by mouth in the morning.   atorvastatin 40 MG tablet Commonly known as: LIPITOR Take 40 mg by mouth every evening.   azithromycin 250 MG tablet Commonly known as: ZITHROMAX Take one available.   Centrum Silver 50+Men Tabs Take 1 tablet by mouth in the morning.   fenofibrate 160 MG tablet Take 160 mg by mouth in the morning.   Fish Oil 1000 MG Caps Take 1,000 mg by mouth in the morning and at bedtime.   Flovent HFA 110 MCG/ACT inhaler Generic drug: fluticasone Inhale 2 puffs into the lungs 2 (two) times daily as needed (Shortness of breath).   furosemide 20 MG tablet Commonly known as: LASIX Take 1 tablet (20 mg total) by mouth daily.   guaiFENesin-dextromethorphan 100-10 MG/5ML syrup Commonly known as: ROBITUSSIN DM Take 5 mLs by mouth every 4 (four) hours as needed for cough (chest congestion).   hydrOXYzine 25 MG tablet Commonly known as: ATARAX Take 1 tablet (25 mg total) by mouth every 8 (eight) hours as needed.   ipratropium-albuterol 0.5-2.5 (3) MG/3ML Soln Commonly known as: DUONEB Take 3 mLs by nebulization in the morning and at bedtime.   loperamide 2 MG capsule Commonly known as: IMODIUM Take 1 capsule (2 mg total) by mouth as needed for diarrhea or loose stools.   melatonin 5 MG Tabs Take 5-10 mg by mouth at bedtime.   OXYGEN Inhale 2.5-3 L/min into the lungs as needed (for shortness of breath).    pantoprazole 40 MG tablet Commonly known as: PROTONIX Take 40 mg by mouth 2 (two) times daily.   vitamin E 180 MG (400 UNITS) capsule Take 400 Units by mouth daily.        Contact information for after-discharge care     Destination     [facility] .   Service: Skilled Nursing Contact information: [address] [place] [address] [phone]                    Discharge Exam: Filed Weights   08/05/23 1500  Weight: 62 kg   Vitals:   08/10/23 2056 08/11/23 0654  BP: 116/77 (!) 114/44  Pulse: 77 76  Resp: 18 16  Temp: 98.2 F (36.8 C) 97.6 F (36.4 C)  SpO2: 97% 94%   Examination: Physical Exam:  Constitutional: WN/WD elderly Caucasian male in no acute distress appears calm Respiratory: Diminished to auscultation bilaterally with some coarse breath sounds, no wheezing, rales, rhonchi or crackles. Normal respiratory effort and patient is not tachypenic. No accessory muscle use.  Unlabored breathing Cardiovascular: RRR, no murmurs / rubs / gallops. S1 and S2 auscultated. No extremity edema.   Abdomen: Soft, non-tender, non-distended. Bowel sounds positive.  GU: Deferred. Musculoskeletal: No clubbing / cyanosis of digits/nails. No joint deformity upper and lower extremities.  Skin: No rashes, lesions, ulcers on limited skin evaluation. No induration; Warm and dry.  Neurologic: CN 2-12 grossly intact with no focal deficits. Romberg sign and cerebellar reflexes not assessed.  Psychiatric: Normal judgment and insight. Alert and oriented x 3. Normal mood and appropriate affect.   Condition at discharge: stable  The results of significant diagnostics from this hospitalization (including imaging, microbiology, ancillary and laboratory) are listed below for reference.   Imaging Studies: DG Chest 1 View  Result Date: 08/06/2023 CLINICAL DATA:  Status post right-sided thoracentesis EXAM: CHEST  1 VIEW  COMPARISON:  Yesterday FINDINGS: Midline trachea. Moderate cardiomegaly. Atherosclerosis in the transverse aorta. Resolved right-sided pleural effusion. Possible trace left pleural fluid. No pneumothorax. Chronic interstitial coarsening, likely related to smoking/chronic bronchitis. No overt congestive failure. Left greater than right base airspace disease, similar on the left and significantly improved on the right. IMPRESSION: No pneumothorax or other acute complication after thoracentesis. Cardiomegaly with significantly improved right base aeration. Left base atelectasis remains. Interstitial coarsening without overt congestive failure. Aortic Atherosclerosis (ICD10-I70.0). Electronically Signed   By: Jeronimo Greaves M.D.   On: 08/06/2023 14:17   US THORACENTESIS ASP PLEURAL SPACE W/IMG GUIDE  Result Date: 08/06/2023 INDICATION: Patient with history of COPD, CHF, chronic kidney disease, encephalopathy, pleural effusions, pneumonia. Request received for diagnostic and therapeutic right thoracentesis. EXAM: ULTRASOUND GUIDED DIAGNOSTIC AND THERAPEUTIC RIGHT THORACENTESIS MEDICATIONS: 8 mL 1% lidocaine COMPLICATIONS: None immediate. PROCEDURE: An ultrasound guided thoracentesis was thoroughly discussed with the patient's son and questions answered. The benefits, risks, alternatives and complications were also discussed. The patient's son understands and wishes to proceed with the procedure. Written consent was obtained. Ultrasound was performed to localize and mark an adequate pocket of fluid in the right chest. The area was then prepped and draped in the normal sterile fashion. 1% Lidocaine was used for local anesthesia. Under ultrasound guidance a 6 Fr Safe-T-Centesis catheter was introduced. Thoracentesis was performed. The catheter was removed and a dressing applied. FINDINGS: A total of approximately 420 cc of hazy,amber fluid was removed. Samples were sent to the laboratory as requested by the clinical team.  IMPRESSION: Successful ultrasound guided diagnostic and therapeutic right thoracentesis yielding 420 cc of pleural fluid. Performed by: Jeananne Rama, PA-C Electronically Signed   By: Richarda Overlie M.D.   On: 08/06/2023 13:54   CT Angio Chest PE W and/or Wo Contrast  Result Date: 08/05/2023 CLINICAL DATA:  87 year old male with altered mental status, delirium, recently hospitalized for pneumonia. EXAM: CT ANGIOGRAPHY CHEST WITH CONTRAST TECHNIQUE: Multidetector CT imaging of the chest was performed using the standard protocol during bolus administration of intravenous contrast. Multiplanar CT image reconstructions and MIPs were obtained to evaluate the vascular anatomy. RADIATION DOSE REDUCTION: This exam was performed according to the departmental dose-optimization program which includes automated exposure control, adjustment of the mA and/or kV according to patient size  and/or use of iterative reconstruction technique. CONTRAST:  75mL OMNIPAQUE IOHEXOL 350 MG/ML SOLN COMPARISON:  Portable chest this morning.  CTA chest 07/31/2022. FINDINGS: Cardiovascular: Excellent contrast bolus timing in the pulmonary arterial tree. Mild respiratory motion. No pulmonary artery filling defect. Calcified aortic atherosclerosis. Calcified coronary artery atherosclerosis. Stable heart size, mild cardiomegaly. No pericardial effusion. Little contrast in the aorta. Mediastinum/Nodes: Stable, negative. Lungs/Pleura: Lower lung volumes. Atelectatic changes to the major airways which remain patent. Layering pleural effusions, up to moderate on the right and small on the left. Superimposed bilateral lower lobe atelectasis versus consolidation (some enhancement of the affected parenchyma on the right). Subtle superimposed sub solid peribronchial nodular opacity in the anterior basal segment of the right lower lobe and throughout the right middle lobe (series 12, image 86). No other convincing pulmonary inflammation. Some underlying  chronic lung changes. Upper Abdomen: Negative visible liver, gallbladder, spleen, pancreas, adrenal glands and stomach. Diverticulosis of the visible colon. Partially visible renal multi cystic disease on the right. Musculoskeletal: Widespread thoracic spinal ankylosis appears related to Diffuse idiopathic skeletal hyperostosis (DISH). No acute or suspicious osseous lesion identified. Review of the MIP images confirms the above findings. IMPRESSION: 1. Negative for acute pulmonary embolus. 2. Lower lung volumes with bilateral up to moderate right and small left layering pleural effusions. Bilateral lower lobe atelectasis versus Pneumonia. And there is evidence of mild Bronchopneumonia in the right middle lobe. 3.  Aortic Atherosclerosis (ICD10-I70.0). Electronically Signed   By: Odessa Fleming M.D.   On: 08/05/2023 06:36   CT Head Wo Contrast  Result Date: 08/05/2023 CLINICAL DATA:  87 year old male with altered mental status, delirium, recently hospitalized for pneumonia. EXAM: CT HEAD WITHOUT CONTRAST TECHNIQUE: Contiguous axial images were obtained from the base of the skull through the vertex without intravenous contrast. RADIATION DOSE REDUCTION: This exam was performed according to the departmental dose-optimization program which includes automated exposure control, adjustment of the mA and/or kV according to patient size and/or use of iterative reconstruction technique. COMPARISON:  Head CT 07/14/2023. FINDINGS: Brain: Stable cerebral volume. Cavum septum pellucidum, normal variant. Chronic lacunar infarct right caudate. No midline shift, ventriculomegaly, mass effect, evidence of mass lesion, intracranial hemorrhage or evidence of cortically based acute infarction. Outside of the right caudate gray-white differentiation appears normal for age. Vascular: No suspicious intracranial vascular hyperdensity. Calcified atherosclerosis at the skull base. Skull: Intact.  No acute osseous abnormality identified.  Sinuses/Orbits: Unresolved right middle ear and right greater than left mastoid opacification. Negative visible nasopharynx. Left tympanic cavity remains pneumatized. But these findings are chronic and not progressed from January of 2023. Minimal paranasal sinus mucosal thickening, stable to improved. Other: No acute orbit or scalp soft tissue finding. IMPRESSION: 1. No acute intracranial abnormality. 2. Chronic lacunar infarct right caudate. 3. Chronic likely Postinflammatory opacification of the right middle ear, mastoids. Electronically Signed   By: Odessa Fleming M.D.   On: 08/05/2023 06:31   DG Chest Portable 1 View  Result Date: 08/05/2023 CLINICAL DATA:  87 year old male with increasing shortness of breath. Recent pneumonia. EXAM: PORTABLE CHEST 1 VIEW COMPARISON:  Portable chest 07/31/2023 and earlier. FINDINGS: Portable AP semi upright view at 0327 hours. Stable lung volumes and mediastinal contours. Calcified aortic atherosclerosis. Increased veiling opacity at both lung bases now, with less pronounced but unresolved confluent right lung base peribronchial opacity since 07/31/2023. No pneumothorax. No pulmonary edema. Stable visualized osseous structures. IMPRESSION: Evidence of new or increased bilateral pleural effusions superimposed on regressed but not resolved right lung  base pneumonia. PA and lateral views of the chest may be valuable when feasible. Electronically Signed   By: Odessa Fleming M.D.   On: 08/05/2023 04:41   ECHOCARDIOGRAM COMPLETE  Result Date: 08/01/2023    ECHOCARDIOGRAM REPORT   Patient Name:   CEFERINO BLUMENSTOCK Folson Date of Exam: 08/01/2023 Medical Rec #:  [id number]       Height:       70.0 in Accession #:    [id number]      Weight:       147.5 lb Date of Birth:  11-08-1935        BSA:          1.834 m Patient Age:    87 years        BP:           112/45 mmHg Patient Gender: M               HR:           105 bpm. Exam Location:  Inpatient Procedure: 2D Echo, Color Doppler, Cardiac Doppler and  Intracardiac            Opacification Agent Indications:    I50.21 Acute systolic (congestive) heart failure  History:        Patient has prior history of Echocardiogram examinations, most                 recent 07/30/2021. COPD, Arrythmias:LBBB; Risk                 Factors:Hypertension, Diabetes and Dyslipidemia.  Sonographer:    Irving Burton Senior RDCS Referring Phys: [phone] AMRIT ADHIKARI IMPRESSIONS  1. Left ventricular ejection fraction, by estimation, is 20 to 25%. The left ventricle has severely decreased function. The left ventricle demonstrates global hypokinesis. The left ventricular internal cavity size was mildly dilated. There is mild asymmetric left ventricular hypertrophy of the infero-lateral segment. Indeterminate diastolic filling due to E-A fusion.  2. Right ventricular systolic function is normal. The right ventricular size is normal. Tricuspid regurgitation signal is inadequate for assessing PA pressure.  3. Left atrial size was mildly dilated.  4. The mitral valve is grossly normal. Trivial mitral valve regurgitation. No evidence of mitral stenosis.  5. The aortic valve is tricuspid. There is mild calcification of the aortic valve. Aortic valve regurgitation is trivial. Aortic valve sclerosis is present, with no evidence of aortic valve stenosis.  6. The inferior vena cava is dilated in size with <50% respiratory variability, suggesting right atrial pressure of 15 mmHg. Comparison(s): No significant change from prior study. Conclusion(s)/Recommendation(s): No left ventricular mural or apical thrombus/thrombi. FINDINGS  Left Ventricle: Left ventricular ejection fraction, by estimation, is 20 to 25%. The left ventricle has severely decreased function. The left ventricle demonstrates global hypokinesis. Definity contrast agent was given IV to delineate the left ventricular endocardial borders. The left ventricular internal cavity size was mildly dilated. There is mild asymmetric left ventricular  hypertrophy of the infero-lateral segment. Abnormal (paradoxical) septal motion, consistent with left bundle branch block. Left ventricular diastolic function could not be evaluated due to atrial fibrillation. Indeterminate diastolic filling due to E-A fusion. Right Ventricle: The right ventricular size is normal. No increase in right ventricular wall thickness. Right ventricular systolic function is normal. Tricuspid regurgitation signal is inadequate for assessing PA pressure. Left Atrium: Left atrial size was mildly dilated. Right Atrium: Right atrial size was normal in size. Pericardium: Trivial pericardial effusion is present. Mitral Valve: The mitral valve  is grossly normal. Trivial mitral valve regurgitation. No evidence of mitral valve stenosis. Tricuspid Valve: The tricuspid valve is normal in structure. Tricuspid valve regurgitation is trivial. Aortic Valve: The aortic valve is tricuspid. There is mild calcification of the aortic valve. Aortic valve regurgitation is trivial. Aortic valve sclerosis is present, with no evidence of aortic valve stenosis. Pulmonic Valve: The pulmonic valve was grossly normal. Pulmonic valve regurgitation is trivial. No evidence of pulmonic stenosis. Aorta: The aortic root and ascending aorta are structurally normal, with no evidence of dilitation. Venous: The inferior vena cava is dilated in size with less than 50% respiratory variability, suggesting right atrial pressure of 15 mmHg. IAS/Shunts: The atrial septum is grossly normal.  LEFT VENTRICLE PLAX 2D LVIDd:         5.50 cm LVIDs:         4.90 cm LV PW:         1.20 cm LV IVS:        0.80 cm LVOT diam:     2.00 cm LV SV:         40 LV SV Index:   22 LVOT Area:     3.14 cm  RIGHT VENTRICLE RV S prime:     11.30 cm/s TAPSE (M-mode): 1.7 cm LEFT ATRIUM             Index        RIGHT ATRIUM           Index LA diam:        4.50 cm 2.45 cm/m   RA Area:     19.80 cm LA Vol (A2C):   73.4 ml 40.02 ml/m  RA Volume:   54.60 ml   29.77 ml/m LA Vol (A4C):   53.8 ml 29.34 ml/m LA Biplane Vol: 64.6 ml 35.23 ml/m  AORTIC VALVE LVOT Vmax:   64.00 cm/s LVOT Vmean:  43.700 cm/s LVOT VTI:    0.127 m  AORTA Ao Root diam: 3.10 cm Ao Asc diam:  2.50 cm  SHUNTS Systemic VTI:  0.13 m Systemic Diam: 2.00 cm Lennie Odor MD Electronically signed by Lennie Odor MD Signature Date/Time: 08/01/2023/3:41:37 PM    Final    US Venous Img Lower Unilateral Right  Result Date: 07/31/2023 CLINICAL DATA:  Swelling. EXAM: Right LOWER EXTREMITY VENOUS DOPPLER ULTRASOUND TECHNIQUE: Gray-scale sonography with compression, as well as color and duplex ultrasound, were performed to evaluate the deep venous system(s) from the level of the common femoral vein through the popliteal and proximal calf veins. COMPARISON:  09/04/2019 FINDINGS: VENOUS Normal compressibility of the common femoral, superficial femoral, and popliteal veins. Visualized portions of profunda femoral vein and great saphenous vein unremarkable. No filling defects to suggest DVT on grayscale or color Doppler imaging. Doppler waveforms show normal direction of venous flow, normal respiratory plasticity and response to augmentation. Limited views of the contralateral common femoral vein are unremarkable. OTHER Soft tissue edema along the calf region. Limitations: Limited evaluation of the calf vessels with the soft tissue edema. IMPRESSION: No evidence of right lower extremity DVT. Limited evaluation of the calf vessels with significant soft tissue edema as per the sonographer. Electronically Signed   By: Karen Kays M.D.   On: 07/31/2023 19:14   DG Chest Portable 1 View  Result Date: 07/31/2023 CLINICAL DATA:  Fever EXAM: PORTABLE CHEST 1 VIEW COMPARISON:  Chest x-ray 07/14/2023 FINDINGS: There are patchy airspace opacities in both lung bases, right greater than left. There is a small right  pleural effusion. The heart is enlarged, unchanged. There is no pneumothorax or acute fracture.  IMPRESSION: 1. Patchy airspace opacities in both lung bases, right greater than left, concerning for pneumonia. 2. Small right pleural effusion. 3. Cardiomegaly. Electronically Signed   By: Darliss Cheney M.D.   On: 07/31/2023 18:19   CT Head Wo Contrast  Result Date: 07/14/2023 CLINICAL DATA:  Altered mental status. EXAM: CT HEAD WITHOUT CONTRAST TECHNIQUE: Contiguous axial images were obtained from the base of the skull through the vertex without intravenous contrast. RADIATION DOSE REDUCTION: This exam was performed according to the departmental dose-optimization program which includes automated exposure control, adjustment of the mA and/or kV according to patient size and/or use of iterative reconstruction technique. COMPARISON:  September 26, 2021 FINDINGS: Brain: There is mild to moderate severity cerebral atrophy with widening of the extra-axial spaces and ventricular dilatation. There are areas of decreased attenuation within the white matter tracts of the supratentorial brain, consistent with microvascular disease changes. Cavum septum pellucidum et vergae is noted. A small, chronic right basal ganglia lacunar infarct is present. Vascular: Marked severity bilateral cavernous carotid artery calcification is seen. Skull: Normal. Negative for fracture or focal lesion. Sinuses/Orbits: There are small bilateral anterior maxillary sinus polyps versus mucous retention cysts. Mild bilateral ethmoid sinus mucosal thickening is also noted. Other: None. IMPRESSION: 1. Generalized cerebral atrophy with chronic white matter small vessel ischemic changes. 2. Small, chronic right basal ganglia lacunar infarct. 3. No acute intracranial abnormality. Electronically Signed   By: Aram Candela M.D.   On: 07/14/2023 23:40   DG Chest 2 View  Result Date: 07/14/2023 CLINICAL DATA:  Altered mental status and chills. EXAM: CHEST - 2 VIEW COMPARISON:  July 31, 2022 FINDINGS: The heart size and mediastinal contours are  within normal limits. There is mild calcification of the aortic arch. Very mild linear atelectasis is seen within the left lung base. There is no evidence of an acute infiltrate, pleural effusion or pneumothorax. Multilevel degenerative changes seen throughout the thoracic spine. IMPRESSION: No active cardiopulmonary disease. Electronically Signed   By: Aram Candela M.D.   On: 07/14/2023 22:42    Microbiology: Results for orders placed or performed during the hospital encounter of 08/05/23  Resp panel by RT-PCR (RSV, Flu A&B, Covid) Anterior Nasal Swab     Status: None   Collection Time: 08/05/23  3:20 AM   Specimen: Anterior Nasal Swab  Result Value Ref Range Status   SARS Coronavirus 2 by RT PCR NEGATIVE NEGATIVE Final    Comment: (NOTE) SARS-CoV-2 target nucleic acids are NOT DETECTED.  The SARS-CoV-2 RNA is generally detectable in upper respiratory specimens during the acute phase of infection. The lowest concentration of SARS-CoV-2 viral copies this assay can detect is 138 copies/mL. A negative result does not preclude SARS-Cov-2 infection and should not be used as the sole basis for treatment or other patient management decisions. A negative result may occur with  improper specimen collection/handling, submission of specimen other than nasopharyngeal swab, presence of viral mutation(s) within the areas targeted by this assay, and inadequate number of viral copies(<138 copies/mL). A negative result must be combined with clinical observations, patient history, and epidemiological information. The expected result is Negative.  Fact Sheet for Patients:  BloggerCourse.com  Fact Sheet for Healthcare Providers:  SeriousBroker.it  This test is no t yet approved or cleared by the [place] FDA and  has been authorized for detection and/or diagnosis of SARS-CoV-2 by FDA under an Emergency Use Authorization (  EUA). This EUA will  remain  in effect (meaning this test can be used) for the duration of the COVID-19 declaration under Section 564(b)(1) of the Act, 21 U.S.C.section 360bbb-3(b)(1), unless the authorization is terminated  or revoked sooner.       Influenza A by PCR NEGATIVE NEGATIVE Final   Influenza B by PCR NEGATIVE NEGATIVE Final    Comment: (NOTE) The Xpert Xpress SARS-CoV-2/FLU/RSV plus assay is intended as an aid in the diagnosis of influenza from Nasopharyngeal swab specimens and should not be used as a sole basis for treatment. Nasal washings and aspirates are unacceptable for Xpert Xpress SARS-CoV-2/FLU/RSV testing.  Fact Sheet for Patients: BloggerCourse.com  Fact Sheet for Healthcare Providers: SeriousBroker.it  This test is not yet approved or cleared by the [place] FDA and has been authorized for detection and/or diagnosis of SARS-CoV-2 by FDA under an Emergency Use Authorization (EUA). This EUA will remain in effect (meaning this test can be used) for the duration of the COVID-19 declaration under Section 564(b)(1) of the Act, 21 U.S.C. section 360bbb-3(b)(1), unless the authorization is terminated or revoked.     Resp Syncytial Virus by PCR NEGATIVE NEGATIVE Final    Comment: (NOTE) Fact Sheet for Patients: BloggerCourse.com  Fact Sheet for Healthcare Providers: SeriousBroker.it  This test is not yet approved or cleared by the [place] FDA and has been authorized for detection and/or diagnosis of SARS-CoV-2 by FDA under an Emergency Use Authorization (EUA). This EUA will remain in effect (meaning this test can be used) for the duration of the COVID-19 declaration under Section 564(b)(1) of the Act, 21 U.S.C. section 360bbb-3(b)(1), unless the authorization is terminated or revoked.  Performed at [facility], [address], [place], [place]  [address]   Blood culture (routine x 2)     Status: None   Collection Time: 08/05/23  5:22 AM   Specimen: BLOOD  Result Value Ref Range Status   Specimen Description   Final    BLOOD LEFT ANTECUBITAL Performed at [facility], [address] [place], [place] [address]    Special Requests   Final    BOTTLES DRAWN AEROBIC AND ANAEROBIC Blood Culture adequate volume Performed at [facility], [address] [place], [place] [address]    Culture   Final    NO GROWTH 5 DAYS Performed at [facility], [address] [place], [place] [address]    Report Status 08/10/2023 FINAL  Final  Blood culture (routine x 2)     Status: None   Collection Time: 08/05/23  5:42 AM   Specimen: BLOOD  Result Value Ref Range Status   Specimen Description   Final    BLOOD RIGHT ANTECUBITAL Performed at [facility], [address], [place], [place] [address]    Special Requests   Final    BOTTLES DRAWN AEROBIC AND ANAEROBIC Blood Culture adequate volume Performed at [facility], [address], [place], [place] [address]    Culture   Final    NO GROWTH 5 DAYS Performed at [facility], [address] [place], [place] [address]    Report Status 08/10/2023 FINAL  Final  MRSA Next Gen by PCR, Nasal     Status: None   Collection Time: 08/05/23  2:35 PM   Specimen: Nasal Mucosa; Nasal Swab  Result Value Ref Range Status   MRSA by PCR Next Gen NOT DETECTED NOT DETECTED Final  Comment: (NOTE) The GeneXpert MRSA Assay (FDA approved for NASAL specimens only), is one component of a comprehensive MRSA colonization surveillance program. It is not intended to diagnose MRSA infection nor to guide or monitor treatment for MRSA infections. Test performance is not FDA approved in patients less than 63 years old. Performed at [facility], [address], [place], [place] [address]   Body fluid culture  w Gram Stain     Status: None   Collection Time: 08/06/23 12:23 PM   Specimen: Pleura; Body Fluid  Result Value Ref Range Status   Specimen Description   Final    PLEURAL Performed at [facility], [address], [place], [place] [address]    Special Requests   Final    Normal Performed at [facility], [address] [place], [place] [address]    Gram Stain   Final    RARE WBC PRESENT,BOTH PMN AND MONONUCLEAR NO ORGANISMS SEEN    Culture   Final    NO GROWTH 3 DAYS Performed at [facility], [address], [place], [place] [address]    Report Status 08/09/2023 FINAL  Final   Labs: CBC: Recent Labs  Lab 08/05/23 0341 08/06/23 0041 08/07/23 0255 08/08/23 0533 08/09/23 0504 08/10/23 0442 08/11/23 0910  WBC 5.5   < > 4.8 3.5* 4.7 4.8 4.9  NEUTROABS 3.5  --   --   --   --   --  3.0  HGB 9.3*   < > 8.7* 8.8* 8.4* 8.6* 9.6*  HCT 29.1*   < > 28.0* 26.6* 26.6* 27.7* 31.3*  MCV 91.2   < > 94.0 90.8 92.0 93.9 96.0  PLT 393   < > 328 309 296 279 298   < > = values in this interval not displayed.   Basic Metabolic Panel: Recent Labs  Lab 08/05/23 0341 08/06/23 0041 08/08/23 0533 08/11/23 0910  NA 135 136 137 133*  K 3.8 4.1 3.7 4.0  CL 97* 98 98 96*  CO2 30 28 28 26   GLUCOSE 105* 92 84 86  BUN 40* 32* 21 19  CREATININE 1.36* 1.27* 1.22 1.29*  CALCIUM 8.8* 8.5* 8.8* 8.8*  MG 1.9  --   --  1.9  PHOS  --   --   --  3.2   Liver Function Tests: Recent Labs  Lab 08/05/23 0341 08/11/23 0910  AST 22 22  ALT 18 17  ALKPHOS 44 42  BILITOT 0.8 0.7  PROT 6.4* 6.6  ALBUMIN 2.6* 2.9*   CBG: No results for input(s): "GLUCAP" in the last 168 hours.  Discharge time spent: greater than 30 minutes.  Signed: Marguerita Merles, DO [facility] 08/11/2023

## 2023-08-11 NOTE — TOC Progression Note (Signed)
Transition of Care [facility]) - Progression Note    Patient Details  Name: Harry Norris MRN: [id number] Date of Birth: [birth year]  [facility] CM/SW Contact  Otelia Santee, LCSW Phone Number: 08/11/2023, 3:31 PM  Clinical Narrative:    Pt's insurance requesting a peer to peer for SNF- Call in is [phone] option 5. Member ID: [id number]. It's due by 4:30pm. MD notified.   Spoke with Shanda Bumps at [facility] who shared that facilities who reviewed pt yesterday are to expensive for pt's family to afford. Family toured another facility- [facility] today and are likely to move forward with placement at this facility. Awaiting final confirmation.    Expected Discharge Plan: Skilled Nursing Facility Barriers to Discharge: Continued Medical Work up  Expected Discharge Plan and Services   Discharge Planning Services: CM Consult   Living arrangements for the past 2 months: Single Family Home Expected Discharge Date: 08/11/23                                     Social Determinants of Health (SDOH) Interventions SDOH Screenings   Food Insecurity: No Food Insecurity (08/06/2023)  Housing: Low Risk  (08/06/2023)  Transportation Needs: No Transportation Needs (08/06/2023)  Utilities: Not At Risk (08/06/2023)  Depression (PHQ2-9): Low Risk  (08/07/2021)  Tobacco Use: Medium Risk (08/05/2023)    Readmission Risk Interventions    08/06/2023    2:21 PM 08/02/2022    2:14 PM 09/29/2021    5:07 PM  Readmission Risk Prevention Plan  Transportation Screening Complete Complete Complete  PCP or Specialist Appt within 3-5 Days  Complete   HRI or Home Care Consult  Complete Not Complete  HRI or Home Care Consult comments   Pending son's decision about 24 hr homecare v/s ALF  Social Work Consult for Recovery Care Planning/Counseling  Complete Not Complete  SW consult not completed comments   Pending son's decision about 24 hr homecare v/s ALF  Palliative  Care Screening  Not Applicable Not Applicable  Medication Review (RN Care Manager) Complete Complete Complete  PCP or Specialist appointment within 3-5 days of discharge Complete    HRI or Home Care Consult Complete    SW Recovery Care/Counseling Consult Complete    Palliative Care Screening Not Applicable    Skilled Nursing Facility Not Applicable

## 2023-08-11 NOTE — Hospital Course (Addendum)
Harry Norris is an 87 y.o. male past medical history significant for COPD on oxygen as needed, diabetes mellitus type 2 with a hemoglobin A1c of 5.8 in 2024, essential hypertension, chronic combined systolic and diastolic heart failure, chronic kidney see stage IIIb, discharged from: On 08/03/2023 for community-acquired pneumonia and acute kidney injury, who was brought in to the hospital for lower back pain and abdominal pain that started the day prior to admission EMS was called he started complaining of shortness of breath found hypoxic 78% on 2 L.  Was placed on 4 L, troponins in the thousand in the ED was started on IV heparin drip. CT angio of the chest was negative for PE, with improving infiltrates. Hep stop after 48 hrs. improved significantly and is doing quite well and is medically stable for discharge at this time to SNF and will need to follow-up within 1 to 2 weeks with PCP, cardiology, and outpatient neurology and will also need a repeat chest x-ray in 3 to 6 weeks  ADDENDUM 08/12/23: Patient was deemed medically stable to be discharged yesterday however for unclear reasons he was not transported to SNF.  He is medically stable still able to transfer to the ER for rehabilitation later today.  PT is being arranged given that the son is no longer able to pick up the patient for discharge.  No acute events overnight and will need to follow-up with PCP and cardiology as well as neurology in outpatient setting.  Assessment and Plan:  Elevated Troponin I and NSTEMI Type II -Twelve-lead EKG showed no acute ST segment changes and cardiology feels that this is demand ischemia and not type I physiology -Troponin I went from 1081 -> 1018 -> 794 -> 798 -In the setting of CKD IIIb. -Likely demand ischemia and Cardiology agreed -Heparin gtt stopped  -C/w ASA 81 mg po Daily, Atorvastatin 40 mg po qHS and cardiology deferring beta-blocker at this time and recommended continuing IV heparin drip for 48  hours and stopping -Currently Awaiting placement to skilled nursing facility -Repeat chest x-ray in 3 to 6 weeks   Acute on Chronic Respiratory Failure with Hypoxia possibly due to Right-Sided Pleural Effusion -He has been weaned to room air.   -Status post thoracocentesis cultures are negative gram stain no organism. -To complete a 7-day course of Abx and now on Augmentin 875-125 mg per tab 1 tab q12h -Not in any Respiratory Distress -C/w Albuterol 2.5 mg Neb q2hprn Wheezing and Guaifenesin-Dextromethorphan 5 mL po q4hprn Cough/Chest Congestion  -Physical Therapy evaluated the patient recommended skilled nursing facility.    Acute Hypoxic Encephalopathy: -Likely due to Hypoxia. SpO2: 94 % O2 Flow Rate (L/min): 4 L/min; now weaned of Supplemental O2 via [place] -Now resolved.   Possible Mild Dementia without Behavioral Disturbance: -At high risk for aspiration and agitation. -Use melatonin at bedtime. -Will need to be evaluated as an outpatient with PCP and Neurology    Chronic kidney disease stage IIIb: -He was aggressively diuresed during last hospitalization.   -Baseline creatinine is around 1.3-1.6. His renal function appears to be at baseline. -BUN/Cr Trend: Recent Labs  Lab 08/01/23 0826 08/02/23 0831 08/03/23 0652 08/05/23 0341 08/06/23 0041 08/08/23 0533 08/11/23 0910  BUN 17 22 31* 40* 32* 21 19  CREATININE 1.42* 1.55* 1.91* 1.36* 1.27* 1.22 1.29*  -Avoid Nephrotoxic Medications, Contrast Dyes, Hypotension and Dehydration to Ensure Adequate Renal Perfusion and will need to Renally Adjust Meds -Continue to Monitor and Trend Renal Function carefully and repeat CMP  within 1 week   Chronic Combined Systolic and Diastolic Heart Failure with EF of 20-25% -Strict I's and O's and Daily Weights; No intake or output data in the 24 hours ending 08/11/23 1242  -C/w Furosemide 20 mg po daily -Cardiology recommending titrating GDMT once he is more stable blood pressure allows and  following up in outpatient setting and eventually initiating SGLT2 inhibitor once he is more stable -He was on losartan at 1 point was discontinued and can be resumed in outpatient setting -Continue to Monitor for S/Sx of Volume Overload  Hyponatremia -Mild. Na+ Trend: Recent Labs  Lab 08/01/23 0826 08/02/23 0831 08/03/23 0652 08/05/23 0341 08/06/23 0041 08/08/23 0533 08/11/23 0910  NA 133* 135 133* 135 136 137 133*  -Continue to Monitor and Trend and Repeat CMP within 1 week   Hyperlipidemia -Continue Atorvastatin 40 mg po Daily and Fenofibrate 160 mg po Daily    Anemia of Chronic Renal Disease Follow-up PCP as an outpatient. -Hgb/Hct Trend: Recent Labs  Lab 08/05/23 0341 08/06/23 0041 08/07/23 0255 08/08/23 0533 08/09/23 0504 08/10/23 0442 08/11/23 0910  HGB 9.3* 8.3* 8.7* 8.8* 8.4* 8.6* 9.6*  HCT 29.1* 26.3* 28.0* 26.6* 26.6* 27.7* 31.3*  MCV 91.2 93.6 94.0 90.8 92.0 93.9 96.0  -Check Anemia Panel in the outpatient setting -Continue to Monitor for S/Sx of Bleeding; No overt bleeding noted -Repeat CBC within 1 week   Controlled Diabetes Mellitus Type 2: -With a last A1c of less than 6. -Continued SSI whiled hospitalized if necessary  -CBG Trend:  Recent Labs  Lab 07/14/23 2138 07/15/23 0735 07/15/23 1151 07/15/23 1745 07/15/23 2131 07/16/23 0736 07/16/23 1217  GLUCAP 73 95 121* 109* 108* 77 119*   Urinary Retention per nursing staff: -Voiding trial done, patient is without a Foley.  Diarrhea -Likely noninfectious given that he is afebrile with no white-in the setting of antibiotics and will continue Imodium if necessary -Continue monitor carefully and should improve once his antibiotics are discontinued after tomorrow    Deconditioning: -Patient refused skilled nursing facility in previous admission. -Consult PT OT and recommending SNF and agreeable and is stable to D/C  GERD/GI Prophylaxis  -C/w Pantoprazole 40 mg po  BID  Hypoalbuminemia -Patient's Albumin Trend: Recent Labs  Lab 07/14/23 2136 07/15/23 0529 08/05/23 0341 08/11/23 0910  ALBUMIN 3.6 3.3* 2.6* 2.9*  -Continue to Monitor and Trend and repeat CMP in the AM

## 2023-08-12 DIAGNOSIS — I5040 Unspecified combined systolic (congestive) and diastolic (congestive) heart failure: Secondary | ICD-10-CM | POA: Diagnosis not present

## 2023-08-12 DIAGNOSIS — J90 Pleural effusion, not elsewhere classified: Secondary | ICD-10-CM | POA: Diagnosis not present

## 2023-08-12 DIAGNOSIS — I5022 Chronic systolic (congestive) heart failure: Secondary | ICD-10-CM | POA: Diagnosis not present

## 2023-08-12 DIAGNOSIS — K219 Gastro-esophageal reflux disease without esophagitis: Secondary | ICD-10-CM | POA: Diagnosis not present

## 2023-08-12 DIAGNOSIS — Z7409 Other reduced mobility: Secondary | ICD-10-CM | POA: Diagnosis not present

## 2023-08-12 DIAGNOSIS — Z741 Need for assistance with personal care: Secondary | ICD-10-CM | POA: Diagnosis not present

## 2023-08-12 DIAGNOSIS — J9620 Acute and chronic respiratory failure, unspecified whether with hypoxia or hypercapnia: Secondary | ICD-10-CM | POA: Diagnosis not present

## 2023-08-12 DIAGNOSIS — I21A1 Myocardial infarction type 2: Secondary | ICD-10-CM | POA: Diagnosis not present

## 2023-08-12 DIAGNOSIS — M6281 Muscle weakness (generalized): Secondary | ICD-10-CM | POA: Diagnosis not present

## 2023-08-12 DIAGNOSIS — M549 Dorsalgia, unspecified: Secondary | ICD-10-CM | POA: Diagnosis not present

## 2023-08-12 DIAGNOSIS — Z7401 Bed confinement status: Secondary | ICD-10-CM | POA: Diagnosis not present

## 2023-08-12 DIAGNOSIS — E119 Type 2 diabetes mellitus without complications: Secondary | ICD-10-CM | POA: Diagnosis not present

## 2023-08-12 DIAGNOSIS — Z743 Need for continuous supervision: Secondary | ICD-10-CM | POA: Diagnosis not present

## 2023-08-12 DIAGNOSIS — E785 Hyperlipidemia, unspecified: Secondary | ICD-10-CM | POA: Diagnosis not present

## 2023-08-12 DIAGNOSIS — R7989 Other specified abnormal findings of blood chemistry: Secondary | ICD-10-CM | POA: Diagnosis not present

## 2023-08-12 DIAGNOSIS — R2681 Unsteadiness on feet: Secondary | ICD-10-CM | POA: Diagnosis not present

## 2023-08-12 DIAGNOSIS — I214 Non-ST elevation (NSTEMI) myocardial infarction: Secondary | ICD-10-CM | POA: Diagnosis not present

## 2023-08-12 DIAGNOSIS — R109 Unspecified abdominal pain: Secondary | ICD-10-CM | POA: Diagnosis not present

## 2023-08-12 DIAGNOSIS — R488 Other symbolic dysfunctions: Secondary | ICD-10-CM | POA: Diagnosis not present

## 2023-08-12 DIAGNOSIS — J4489 Other specified chronic obstructive pulmonary disease: Secondary | ICD-10-CM | POA: Diagnosis not present

## 2023-08-12 DIAGNOSIS — I5030 Unspecified diastolic (congestive) heart failure: Secondary | ICD-10-CM | POA: Diagnosis not present

## 2023-08-12 DIAGNOSIS — J9621 Acute and chronic respiratory failure with hypoxia: Secondary | ICD-10-CM | POA: Diagnosis not present

## 2023-08-12 DIAGNOSIS — I499 Cardiac arrhythmia, unspecified: Secondary | ICD-10-CM | POA: Diagnosis not present

## 2023-08-12 NOTE — Discharge Summary (Signed)
Physician Discharge Summary   Patient: Harry Norris MRN: [id number] DOB: February 04, 1936  Admit date:     08/05/2023  Discharge date: 08/11/23  Discharge Physician: Marguerita Merles, DO   PCP: Paulina Fusi, MD   Recommendations at discharge:   Follow-up with PCP within 1 to 2 weeks and repeat CBC, CMP, mag, Phos within 1 week Follow-up with Cardiology outpatient setting and have GDMT titrated outpatient setting Follow-up with Neurology outpatient setting for outpatient evaluation for dementia evaluation Repeat Chest X-ray in 3 to 6 weeks  Discharge Diagnoses: Principal Problem:   NSTEMI (non-ST elevated myocardial infarction) (HCC) Active Problems:   Elevated troponin level not due myocardial infarction   Demand ischemia (HCC)   Acute on chronic hypoxic respiratory failure (HCC)   Chronic HFrEF (heart failure with reduced ejection fraction) (HCC)   Diabetes mellitus type 2, noninsulin dependent (HCC)  Resolved Problems:   * No resolved hospital problems. *  Hospital Course: Harry Norris is an 87 y.o. male past medical history significant for COPD on oxygen as needed, diabetes mellitus type 2 with a hemoglobin A1c of 5.8 in 2024, essential hypertension, chronic combined systolic and diastolic heart failure, chronic kidney see stage IIIb, discharged from: On 08/03/2023 for community-acquired pneumonia and acute kidney injury, who was brought in to the hospital for lower back pain and abdominal pain that started the day prior to admission EMS was called he started complaining of shortness of breath found hypoxic 78% on 2 L.  Was placed on 4 L, troponins in the thousand in the ED was started on IV heparin drip. CT angio of the chest was negative for PE, with improving infiltrates. Hep stop after 48 hrs. improved significantly and is doing quite well and is medically stable for discharge at this time to SNF and will need to follow-up within 1 to 2 weeks with PCP, cardiology, and outpatient  neurology and will also need a repeat chest x-ray in 3 to 6 weeks  ADDENDUM 08/12/23: Patient was deemed medically stable to be discharged yesterday however for unclear reasons he was not transported to SNF.  He is medically stable still able to transfer to the ER for rehabilitation later today.  PT is being arranged given that the son is no longer able to pick up the patient for discharge.  No acute events overnight and will need to follow-up with PCP and cardiology as well as neurology in outpatient setting.  Assessment and Plan:  Elevated Troponin I and NSTEMI Type II -Twelve-lead EKG showed no acute ST segment changes and cardiology feels that this is demand ischemia and not type I physiology -Troponin I went from 1081 -> 1018 -> 794 -> 798 -In the setting of CKD IIIb. -Likely demand ischemia and Cardiology agreed -Heparin gtt stopped  -C/w ASA 81 mg po Daily, Atorvastatin 40 mg po qHS and cardiology deferring beta-blocker at this time and recommended continuing IV heparin drip for 48 hours and stopping -Currently Awaiting placement to skilled nursing facility -Repeat chest x-ray in 3 to 6 weeks   Acute on Chronic Respiratory Failure with Hypoxia possibly due to Right-Sided Pleural Effusion -He has been weaned to room air.   -Status post thoracocentesis cultures are negative gram stain no organism. -To complete a 7-day course of Abx and now on Augmentin 875-125 mg per tab 1 tab q12h -Not in any Respiratory Distress -C/w Albuterol 2.5 mg Neb q2hprn Wheezing and Guaifenesin-Dextromethorphan 5 mL po q4hprn Cough/Chest Congestion  -Physical Therapy evaluated the  patient recommended skilled nursing facility.    Acute Hypoxic Encephalopathy: -Likely due to Hypoxia. SpO2: 94 % O2 Flow Rate (L/min): 4 L/min; now weaned of Supplemental O2 via [place] -Now resolved.   Possible Mild Dementia without Behavioral Disturbance: -At high risk for aspiration and agitation. -Use melatonin at  bedtime. -Will need to be evaluated as an outpatient with PCP and Neurology    Chronic kidney disease stage IIIb: -He was aggressively diuresed during last hospitalization.   -Baseline creatinine is around 1.3-1.6. His renal function appears to be at baseline. -BUN/Cr Trend: Recent Labs  Lab 08/01/23 0826 08/02/23 0831 08/03/23 0652 08/05/23 0341 08/06/23 0041 08/08/23 0533 08/11/23 0910  BUN 17 22 31* 40* 32* 21 19  CREATININE 1.42* 1.55* 1.91* 1.36* 1.27* 1.22 1.29*  -Avoid Nephrotoxic Medications, Contrast Dyes, Hypotension and Dehydration to Ensure Adequate Renal Perfusion and will need to Renally Adjust Meds -Continue to Monitor and Trend Renal Function carefully and repeat CMP within 1 week   Chronic Combined Systolic and Diastolic Heart Failure with EF of 20-25% -Strict I's and O's and Daily Weights; No intake or output data in the 24 hours ending 08/11/23 1242  -C/w Furosemide 20 mg po daily -Cardiology recommending titrating GDMT once he is more stable blood pressure allows and following up in outpatient setting and eventually initiating SGLT2 inhibitor once he is more stable -He was on losartan at 1 point was discontinued and can be resumed in outpatient setting -Continue to Monitor for S/Sx of Volume Overload  Hyponatremia -Mild. Na+ Trend: Recent Labs  Lab 08/01/23 0826 08/02/23 0831 08/03/23 0652 08/05/23 0341 08/06/23 0041 08/08/23 0533 08/11/23 0910  NA 133* 135 133* 135 136 137 133*  -Continue to Monitor and Trend and Repeat CMP within 1 week   Hyperlipidemia -Continue Atorvastatin 40 mg po Daily and Fenofibrate 160 mg po Daily    Anemia of Chronic Renal Disease Follow-up PCP as an outpatient. -Hgb/Hct Trend: Recent Labs  Lab 08/05/23 0341 08/06/23 0041 08/07/23 0255 08/08/23 0533 08/09/23 0504 08/10/23 0442 08/11/23 0910  HGB 9.3* 8.3* 8.7* 8.8* 8.4* 8.6* 9.6*  HCT 29.1* 26.3* 28.0* 26.6* 26.6* 27.7* 31.3*  MCV 91.2 93.6 94.0 90.8 92.0 93.9  96.0  -Check Anemia Panel in the outpatient setting -Continue to Monitor for S/Sx of Bleeding; No overt bleeding noted -Repeat CBC within 1 week   Controlled Diabetes Mellitus Type 2: -With a last A1c of less than 6. -Continued SSI whiled hospitalized if necessary  -CBG Trend:  Recent Labs  Lab 07/14/23 2138 07/15/23 0735 07/15/23 1151 07/15/23 1745 07/15/23 2131 07/16/23 0736 07/16/23 1217  GLUCAP 73 95 121* 109* 108* 77 119*   Urinary Retention per nursing staff: -Voiding trial done, patient is without a Foley.  Diarrhea -Likely noninfectious given that he is afebrile with no white-in the setting of antibiotics and will continue Imodium if necessary -Continue monitor carefully and should improve once his antibiotics are discontinued after tomorrow    Deconditioning: -Patient refused skilled nursing facility in previous admission. -Consult PT OT and recommending SNF and agreeable and is stable to D/C  GERD/GI Prophylaxis  -C/w Pantoprazole 40 mg po BID  Hypoalbuminemia -Patient's Albumin Trend: Recent Labs  Lab 07/14/23 2136 07/15/23 0529 08/05/23 0341 08/11/23 0910  ALBUMIN 3.6 3.3* 2.6* 2.9*  -Continue to Monitor and Trend and repeat CMP in the AM   Active Pressure Injury/Wound(s)     Pressure Ulcer  Duration          Pressure Injury 09/26/21  Buttocks Left Deep Tissue Pressure Injury - Purple or maroon localized area of discolored intact skin or blood-filled blister due to damage of underlying soft tissue from pressure and/or shear. 683 days           Consultants: Cardiology Procedures performed:  Successful ultrasound guided diagnostic and therapeutic right thoracentesis yielding 420 cc of pleural fluid. Disposition: Skilled nursing facility Diet recommendation:  Discharge Diet Orders (From admission, onward)     Start     Ordered   08/09/23 0000  Diet - low sodium heart healthy        08/09/23 0918           Cardiac diet DISCHARGE  MEDICATION: Allergies as of 08/12/2023       Reactions   Ace Inhibitors Cough        Medication List     STOP taking these medications    amoxicillin-clavulanate 500-125 MG tablet Commonly known as: Augmentin Replaced by: amoxicillin-clavulanate 875-125 MG tablet   ascorbic acid 500 MG tablet Commonly known as: VITAMIN C   guaiFENesin 600 MG 12 hr tablet Commonly known as: MUCINEX       TAKE these medications    acetaminophen 325 MG tablet Commonly known as: TYLENOL Take 2 tablets (650 mg total) by mouth every 6 (six) hours as needed for mild pain (pain score 1-3) (or Fever >/= 101).   albuterol 108 (90 Base) MCG/ACT inhaler Commonly known as: VENTOLIN HFA Inhale 2 puffs into the lungs every 6 (six) hours as needed for wheezing or shortness of breath.   amoxicillin-clavulanate 875-125 MG tablet Commonly known as: AUGMENTIN Take 1 tablet by mouth every 12 (twelve) hours for 2 days. Replaces: amoxicillin-clavulanate 500-125 MG tablet   aspirin EC 81 MG tablet Take 81 mg by mouth in the morning.   atorvastatin 40 MG tablet Commonly known as: LIPITOR Take 40 mg by mouth every evening.   azithromycin 250 MG tablet Commonly known as: ZITHROMAX Take one available.   Centrum Silver 50+Men Tabs Take 1 tablet by mouth in the morning.   fenofibrate 160 MG tablet Take 160 mg by mouth in the morning.   Fish Oil 1000 MG Caps Take 1,000 mg by mouth in the morning and at bedtime.   Flovent HFA 110 MCG/ACT inhaler Generic drug: fluticasone Inhale 2 puffs into the lungs 2 (two) times daily as needed (Shortness of breath).   furosemide 20 MG tablet Commonly known as: LASIX Take 1 tablet (20 mg total) by mouth daily.   guaiFENesin-dextromethorphan 100-10 MG/5ML syrup Commonly known as: ROBITUSSIN DM Take 5 mLs by mouth every 4 (four) hours as needed for cough (chest congestion).   hydrOXYzine 25 MG tablet Commonly known as: ATARAX Take 1 tablet (25 mg total) by  mouth every 8 (eight) hours as needed.   ipratropium-albuterol 0.5-2.5 (3) MG/3ML Soln Commonly known as: DUONEB Take 3 mLs by nebulization in the morning and at bedtime.   loperamide 2 MG capsule Commonly known as: IMODIUM Take 1 capsule (2 mg total) by mouth as needed for diarrhea or loose stools.   melatonin 5 MG Tabs Take 5-10 mg by mouth at bedtime.   OXYGEN Inhale 2.5-3 L/min into the lungs as needed (for shortness of breath).   pantoprazole 40 MG tablet Commonly known as: PROTONIX Take 40 mg by mouth 2 (two) times daily.   vitamin E 180 MG (400 UNITS) capsule Take 400 Units by mouth daily.        Contact information for  after-discharge care     Destination     [facility] .   Service: Skilled Nursing Contact information: [address] [place] [address] [phone]                    Discharge Exam: Filed Weights   08/05/23 1500  Weight: 62 kg   Vitals:   08/11/23 1939 08/12/23 0522  BP: (!) 107/52 (!) 118/46  Pulse: 80 73  Resp: 18 18  Temp: 98.1 F (36.7 C) (!) 97.5 F (36.4 C)  SpO2: 95% 99%   Examination: Physical Exam:  Constitutional: WN/WD elderly Caucasian male in no acute distress appears calm Respiratory: Diminished to auscultation bilaterally with some coarse breath sounds, no wheezing, rales, rhonchi or crackles. Normal respiratory effort and patient is not tachypenic. No accessory muscle use.  Unlabored breathing Cardiovascular: RRR, no murmurs / rubs / gallops. S1 and S2 auscultated. No extremity edema.   Abdomen: Soft, non-tender, non-distended. Bowel sounds positive.  GU: Deferred. Musculoskeletal: No clubbing / cyanosis of digits/nails. No joint deformity upper and lower extremities.  Skin: No rashes, lesions, ulcers on limited skin evaluation. No induration; Warm and dry.  Neurologic: CN 2-12 grossly intact with no focal deficits. Romberg sign and cerebellar  reflexes not assessed.  Psychiatric: Normal judgment and insight. Alert and oriented x 3. Normal mood and appropriate affect.   Condition at discharge: stable  The results of significant diagnostics from this hospitalization (including imaging, microbiology, ancillary and laboratory) are listed below for reference.   Imaging Studies: DG Chest 1 View Result Date: 08/06/2023 CLINICAL DATA:  Status post right-sided thoracentesis EXAM: CHEST  1 VIEW COMPARISON:  Yesterday FINDINGS: Midline trachea. Moderate cardiomegaly. Atherosclerosis in the transverse aorta. Resolved right-sided pleural effusion. Possible trace left pleural fluid. No pneumothorax. Chronic interstitial coarsening, likely related to smoking/chronic bronchitis. No overt congestive failure. Left greater than right base airspace disease, similar on the left and significantly improved on the right. IMPRESSION: No pneumothorax or other acute complication after thoracentesis. Cardiomegaly with significantly improved right base aeration. Left base atelectasis remains. Interstitial coarsening without overt congestive failure. Aortic Atherosclerosis (ICD10-I70.0). Electronically Signed   By: Jeronimo Greaves M.D.   On: 08/06/2023 14:17   US THORACENTESIS ASP PLEURAL SPACE W/IMG GUIDE Result Date: 08/06/2023 INDICATION: Patient with history of COPD, CHF, chronic kidney disease, encephalopathy, pleural effusions, pneumonia. Request received for diagnostic and therapeutic right thoracentesis. EXAM: ULTRASOUND GUIDED DIAGNOSTIC AND THERAPEUTIC RIGHT THORACENTESIS MEDICATIONS: 8 mL 1% lidocaine COMPLICATIONS: None immediate. PROCEDURE: An ultrasound guided thoracentesis was thoroughly discussed with the patient's son and questions answered. The benefits, risks, alternatives and complications were also discussed. The patient's son understands and wishes to proceed with the procedure. Written consent was obtained. Ultrasound was performed to localize and mark  an adequate pocket of fluid in the right chest. The area was then prepped and draped in the normal sterile fashion. 1% Lidocaine was used for local anesthesia. Under ultrasound guidance a 6 Fr Safe-T-Centesis catheter was introduced. Thoracentesis was performed. The catheter was removed and a dressing applied. FINDINGS: A total of approximately 420 cc of hazy,amber fluid was removed. Samples were sent to the laboratory as requested by the clinical team. IMPRESSION: Successful ultrasound guided diagnostic and therapeutic right thoracentesis yielding 420 cc of pleural fluid. Performed by: Jeananne Rama, PA-C Electronically Signed   By: Richarda Overlie M.D.   On: 08/06/2023 13:54   CT Angio Chest PE W and/or Wo Contrast Result  Date: 08/05/2023 CLINICAL DATA:  87 year old male with altered mental status, delirium, recently hospitalized for pneumonia. EXAM: CT ANGIOGRAPHY CHEST WITH CONTRAST TECHNIQUE: Multidetector CT imaging of the chest was performed using the standard protocol during bolus administration of intravenous contrast. Multiplanar CT image reconstructions and MIPs were obtained to evaluate the vascular anatomy. RADIATION DOSE REDUCTION: This exam was performed according to the departmental dose-optimization program which includes automated exposure control, adjustment of the mA and/or kV according to patient size and/or use of iterative reconstruction technique. CONTRAST:  75mL OMNIPAQUE IOHEXOL 350 MG/ML SOLN COMPARISON:  Portable chest this morning.  CTA chest 07/31/2022. FINDINGS: Cardiovascular: Excellent contrast bolus timing in the pulmonary arterial tree. Mild respiratory motion. No pulmonary artery filling defect. Calcified aortic atherosclerosis. Calcified coronary artery atherosclerosis. Stable heart size, mild cardiomegaly. No pericardial effusion. Little contrast in the aorta. Mediastinum/Nodes: Stable, negative. Lungs/Pleura: Lower lung volumes. Atelectatic changes to the major airways which  remain patent. Layering pleural effusions, up to moderate on the right and small on the left. Superimposed bilateral lower lobe atelectasis versus consolidation (some enhancement of the affected parenchyma on the right). Subtle superimposed sub solid peribronchial nodular opacity in the anterior basal segment of the right lower lobe and throughout the right middle lobe (series 12, image 86). No other convincing pulmonary inflammation. Some underlying chronic lung changes. Upper Abdomen: Negative visible liver, gallbladder, spleen, pancreas, adrenal glands and stomach. Diverticulosis of the visible colon. Partially visible renal multi cystic disease on the right. Musculoskeletal: Widespread thoracic spinal ankylosis appears related to Diffuse idiopathic skeletal hyperostosis (DISH). No acute or suspicious osseous lesion identified. Review of the MIP images confirms the above findings. IMPRESSION: 1. Negative for acute pulmonary embolus. 2. Lower lung volumes with bilateral up to moderate right and small left layering pleural effusions. Bilateral lower lobe atelectasis versus Pneumonia. And there is evidence of mild Bronchopneumonia in the right middle lobe. 3.  Aortic Atherosclerosis (ICD10-I70.0). Electronically Signed   By: Odessa Fleming M.D.   On: 08/05/2023 06:36   CT Head Wo Contrast Result Date: 08/05/2023 CLINICAL DATA:  87 year old male with altered mental status, delirium, recently hospitalized for pneumonia. EXAM: CT HEAD WITHOUT CONTRAST TECHNIQUE: Contiguous axial images were obtained from the base of the skull through the vertex without intravenous contrast. RADIATION DOSE REDUCTION: This exam was performed according to the departmental dose-optimization program which includes automated exposure control, adjustment of the mA and/or kV according to patient size and/or use of iterative reconstruction technique. COMPARISON:  Head CT 07/14/2023. FINDINGS: Brain: Stable cerebral volume. Cavum septum pellucidum,  normal variant. Chronic lacunar infarct right caudate. No midline shift, ventriculomegaly, mass effect, evidence of mass lesion, intracranial hemorrhage or evidence of cortically based acute infarction. Outside of the right caudate gray-white differentiation appears normal for age. Vascular: No suspicious intracranial vascular hyperdensity. Calcified atherosclerosis at the skull base. Skull: Intact.  No acute osseous abnormality identified. Sinuses/Orbits: Unresolved right middle ear and right greater than left mastoid opacification. Negative visible nasopharynx. Left tympanic cavity remains pneumatized. But these findings are chronic and not progressed from January of 2023. Minimal paranasal sinus mucosal thickening, stable to improved. Other: No acute orbit or scalp soft tissue finding. IMPRESSION: 1. No acute intracranial abnormality. 2. Chronic lacunar infarct right caudate. 3. Chronic likely Postinflammatory opacification of the right middle ear, mastoids. Electronically Signed   By: Odessa Fleming M.D.   On: 08/05/2023 06:31   DG Chest Portable 1 View Result Date: 08/05/2023 CLINICAL DATA:  87 year old male with increasing shortness of  breath. Recent pneumonia. EXAM: PORTABLE CHEST 1 VIEW COMPARISON:  Portable chest 07/31/2023 and earlier. FINDINGS: Portable AP semi upright view at 0327 hours. Stable lung volumes and mediastinal contours. Calcified aortic atherosclerosis. Increased veiling opacity at both lung bases now, with less pronounced but unresolved confluent right lung base peribronchial opacity since 07/31/2023. No pneumothorax. No pulmonary edema. Stable visualized osseous structures. IMPRESSION: Evidence of new or increased bilateral pleural effusions superimposed on regressed but not resolved right lung base pneumonia. PA and lateral views of the chest may be valuable when feasible. Electronically Signed   By: Odessa Fleming M.D.   On: 08/05/2023 04:41   ECHOCARDIOGRAM COMPLETE Result Date: 08/01/2023     ECHOCARDIOGRAM REPORT   Patient Name:   JADD LINDEMANN Kwan Date of Exam: 08/01/2023 Medical Rec #:  [id number]       Height:       70.0 in Accession #:    [id number]      Weight:       147.5 lb Date of Birth:  23-Jun-1936        BSA:          1.834 m Patient Age:    87 years        BP:           112/45 mmHg Patient Gender: M               HR:           105 bpm. Exam Location:  Inpatient Procedure: 2D Echo, Color Doppler, Cardiac Doppler and Intracardiac            Opacification Agent Indications:    I50.21 Acute systolic (congestive) heart failure  History:        Patient has prior history of Echocardiogram examinations, most                 recent 07/30/2021. COPD, Arrythmias:LBBB; Risk                 Factors:Hypertension, Diabetes and Dyslipidemia.  Sonographer:    Irving Burton Senior RDCS Referring Phys: [phone] AMRIT ADHIKARI IMPRESSIONS  1. Left ventricular ejection fraction, by estimation, is 20 to 25%. The left ventricle has severely decreased function. The left ventricle demonstrates global hypokinesis. The left ventricular internal cavity size was mildly dilated. There is mild asymmetric left ventricular hypertrophy of the infero-lateral segment. Indeterminate diastolic filling due to E-A fusion.  2. Right ventricular systolic function is normal. The right ventricular size is normal. Tricuspid regurgitation signal is inadequate for assessing PA pressure.  3. Left atrial size was mildly dilated.  4. The mitral valve is grossly normal. Trivial mitral valve regurgitation. No evidence of mitral stenosis.  5. The aortic valve is tricuspid. There is mild calcification of the aortic valve. Aortic valve regurgitation is trivial. Aortic valve sclerosis is present, with no evidence of aortic valve stenosis.  6. The inferior vena cava is dilated in size with <50% respiratory variability, suggesting right atrial pressure of 15 mmHg. Comparison(s): No significant change from prior study. Conclusion(s)/Recommendation(s): No left  ventricular mural or apical thrombus/thrombi. FINDINGS  Left Ventricle: Left ventricular ejection fraction, by estimation, is 20 to 25%. The left ventricle has severely decreased function. The left ventricle demonstrates global hypokinesis. Definity contrast agent was given IV to delineate the left ventricular endocardial borders. The left ventricular internal cavity size was mildly dilated. There is mild asymmetric left ventricular hypertrophy of the infero-lateral segment. Abnormal (paradoxical) septal motion, consistent with left bundle  branch block. Left ventricular diastolic function could not be evaluated due to atrial fibrillation. Indeterminate diastolic filling due to E-A fusion. Right Ventricle: The right ventricular size is normal. No increase in right ventricular wall thickness. Right ventricular systolic function is normal. Tricuspid regurgitation signal is inadequate for assessing PA pressure. Left Atrium: Left atrial size was mildly dilated. Right Atrium: Right atrial size was normal in size. Pericardium: Trivial pericardial effusion is present. Mitral Valve: The mitral valve is grossly normal. Trivial mitral valve regurgitation. No evidence of mitral valve stenosis. Tricuspid Valve: The tricuspid valve is normal in structure. Tricuspid valve regurgitation is trivial. Aortic Valve: The aortic valve is tricuspid. There is mild calcification of the aortic valve. Aortic valve regurgitation is trivial. Aortic valve sclerosis is present, with no evidence of aortic valve stenosis. Pulmonic Valve: The pulmonic valve was grossly normal. Pulmonic valve regurgitation is trivial. No evidence of pulmonic stenosis. Aorta: The aortic root and ascending aorta are structurally normal, with no evidence of dilitation. Venous: The inferior vena cava is dilated in size with less than 50% respiratory variability, suggesting right atrial pressure of 15 mmHg. IAS/Shunts: The atrial septum is grossly normal.  LEFT VENTRICLE  PLAX 2D LVIDd:         5.50 cm LVIDs:         4.90 cm LV PW:         1.20 cm LV IVS:        0.80 cm LVOT diam:     2.00 cm LV SV:         40 LV SV Index:   22 LVOT Area:     3.14 cm  RIGHT VENTRICLE RV S prime:     11.30 cm/s TAPSE (M-mode): 1.7 cm LEFT ATRIUM             Index        RIGHT ATRIUM           Index LA diam:        4.50 cm 2.45 cm/m   RA Area:     19.80 cm LA Vol (A2C):   73.4 ml 40.02 ml/m  RA Volume:   54.60 ml  29.77 ml/m LA Vol (A4C):   53.8 ml 29.34 ml/m LA Biplane Vol: 64.6 ml 35.23 ml/m  AORTIC VALVE LVOT Vmax:   64.00 cm/s LVOT Vmean:  43.700 cm/s LVOT VTI:    0.127 m  AORTA Ao Root diam: 3.10 cm Ao Asc diam:  2.50 cm  SHUNTS Systemic VTI:  0.13 m Systemic Diam: 2.00 cm Lennie Odor MD Electronically signed by Lennie Odor MD Signature Date/Time: 08/01/2023/3:41:37 PM    Final    US Venous Img Lower Unilateral Right Result Date: 07/31/2023 CLINICAL DATA:  Swelling. EXAM: Right LOWER EXTREMITY VENOUS DOPPLER ULTRASOUND TECHNIQUE: Gray-scale sonography with compression, as well as color and duplex ultrasound, were performed to evaluate the deep venous system(s) from the level of the common femoral vein through the popliteal and proximal calf veins. COMPARISON:  09/04/2019 FINDINGS: VENOUS Normal compressibility of the common femoral, superficial femoral, and popliteal veins. Visualized portions of profunda femoral vein and great saphenous vein unremarkable. No filling defects to suggest DVT on grayscale or color Doppler imaging. Doppler waveforms show normal direction of venous flow, normal respiratory plasticity and response to augmentation. Limited views of the contralateral common femoral vein are unremarkable. OTHER Soft tissue edema along the calf region. Limitations: Limited evaluation of the calf vessels with the soft tissue edema. IMPRESSION: No evidence  of right lower extremity DVT. Limited evaluation of the calf vessels with significant soft tissue edema as per the  sonographer. Electronically Signed   By: Karen Kays M.D.   On: 07/31/2023 19:14   DG Chest Portable 1 View Result Date: 07/31/2023 CLINICAL DATA:  Fever EXAM: PORTABLE CHEST 1 VIEW COMPARISON:  Chest x-ray 07/14/2023 FINDINGS: There are patchy airspace opacities in both lung bases, right greater than left. There is a small right pleural effusion. The heart is enlarged, unchanged. There is no pneumothorax or acute fracture. IMPRESSION: 1. Patchy airspace opacities in both lung bases, right greater than left, concerning for pneumonia. 2. Small right pleural effusion. 3. Cardiomegaly. Electronically Signed   By: Darliss Cheney M.D.   On: 07/31/2023 18:19   CT Head Wo Contrast Result Date: 07/14/2023 CLINICAL DATA:  Altered mental status. EXAM: CT HEAD WITHOUT CONTRAST TECHNIQUE: Contiguous axial images were obtained from the base of the skull through the vertex without intravenous contrast. RADIATION DOSE REDUCTION: This exam was performed according to the departmental dose-optimization program which includes automated exposure control, adjustment of the mA and/or kV according to patient size and/or use of iterative reconstruction technique. COMPARISON:  September 26, 2021 FINDINGS: Brain: There is mild to moderate severity cerebral atrophy with widening of the extra-axial spaces and ventricular dilatation. There are areas of decreased attenuation within the white matter tracts of the supratentorial brain, consistent with microvascular disease changes. Cavum septum pellucidum et vergae is noted. A small, chronic right basal ganglia lacunar infarct is present. Vascular: Marked severity bilateral cavernous carotid artery calcification is seen. Skull: Normal. Negative for fracture or focal lesion. Sinuses/Orbits: There are small bilateral anterior maxillary sinus polyps versus mucous retention cysts. Mild bilateral ethmoid sinus mucosal thickening is also noted. Other: None. IMPRESSION: 1. Generalized cerebral  atrophy with chronic white matter small vessel ischemic changes. 2. Small, chronic right basal ganglia lacunar infarct. 3. No acute intracranial abnormality. Electronically Signed   By: Aram Candela M.D.   On: 07/14/2023 23:40   DG Chest 2 View Result Date: 07/14/2023 CLINICAL DATA:  Altered mental status and chills. EXAM: CHEST - 2 VIEW COMPARISON:  July 31, 2022 FINDINGS: The heart size and mediastinal contours are within normal limits. There is mild calcification of the aortic arch. Very mild linear atelectasis is seen within the left lung base. There is no evidence of an acute infiltrate, pleural effusion or pneumothorax. Multilevel degenerative changes seen throughout the thoracic spine. IMPRESSION: No active cardiopulmonary disease. Electronically Signed   By: Aram Candela M.D.   On: 07/14/2023 22:42    Microbiology: Results for orders placed or performed during the hospital encounter of 08/05/23  Resp panel by RT-PCR (RSV, Flu A&B, Covid) Anterior Nasal Swab     Status: None   Collection Time: 08/05/23  3:20 AM   Specimen: Anterior Nasal Swab  Result Value Ref Range Status   SARS Coronavirus 2 by RT PCR NEGATIVE NEGATIVE Final    Comment: (NOTE) SARS-CoV-2 target nucleic acids are NOT DETECTED.  The SARS-CoV-2 RNA is generally detectable in upper respiratory specimens during the acute phase of infection. The lowest concentration of SARS-CoV-2 viral copies this assay can detect is 138 copies/mL. A negative result does not preclude SARS-Cov-2 infection and should not be used as the sole basis for treatment or other patient management decisions. A negative result may occur with  improper specimen collection/handling, submission of specimen other than nasopharyngeal swab, presence of viral mutation(s) within the areas targeted by this  assay, and inadequate number of viral copies(<138 copies/mL). A negative result must be combined with clinical observations, patient history,  and epidemiological information. The expected result is Negative.  Fact Sheet for Patients:  BloggerCourse.com  Fact Sheet for Healthcare Providers:  SeriousBroker.it  This test is no t yet approved or cleared by the [place] FDA and  has been authorized for detection and/or diagnosis of SARS-CoV-2 by FDA under an Emergency Use Authorization (EUA). This EUA will remain  in effect (meaning this test can be used) for the duration of the COVID-19 declaration under Section 564(b)(1) of the Act, 21 U.S.C.section 360bbb-3(b)(1), unless the authorization is terminated  or revoked sooner.       Influenza A by PCR NEGATIVE NEGATIVE Final   Influenza B by PCR NEGATIVE NEGATIVE Final    Comment: (NOTE) The Xpert Xpress SARS-CoV-2/FLU/RSV plus assay is intended as an aid in the diagnosis of influenza from Nasopharyngeal swab specimens and should not be used as a sole basis for treatment. Nasal washings and aspirates are unacceptable for Xpert Xpress SARS-CoV-2/FLU/RSV testing.  Fact Sheet for Patients: BloggerCourse.com  Fact Sheet for Healthcare Providers: SeriousBroker.it  This test is not yet approved or cleared by the [place] FDA and has been authorized for detection and/or diagnosis of SARS-CoV-2 by FDA under an Emergency Use Authorization (EUA). This EUA will remain in effect (meaning this test can be used) for the duration of the COVID-19 declaration under Section 564(b)(1) of the Act, 21 U.S.C. section 360bbb-3(b)(1), unless the authorization is terminated or revoked.     Resp Syncytial Virus by PCR NEGATIVE NEGATIVE Final    Comment: (NOTE) Fact Sheet for Patients: BloggerCourse.com  Fact Sheet for Healthcare Providers: SeriousBroker.it  This test is not yet approved or cleared by the [place] FDA and has  been authorized for detection and/or diagnosis of SARS-CoV-2 by FDA under an Emergency Use Authorization (EUA). This EUA will remain in effect (meaning this test can be used) for the duration of the COVID-19 declaration under Section 564(b)(1) of the Act, 21 U.S.C. section 360bbb-3(b)(1), unless the authorization is terminated or revoked.  Performed at [facility], [address] [place], [place] [address]   Blood culture (routine x 2)     Status: None   Collection Time: 08/05/23  5:22 AM   Specimen: BLOOD  Result Value Ref Range Status   Specimen Description   Final    BLOOD LEFT ANTECUBITAL Performed at [facility], [address], [place], [place] [address]    Special Requests   Final    BOTTLES DRAWN AEROBIC AND ANAEROBIC Blood Culture adequate volume Performed at [facility], [address], [place], [place] [address]    Culture   Final    NO GROWTH 5 DAYS Performed at [facility], [address] [place], [place] [address]    Report Status 08/10/2023 FINAL  Final  Blood culture (routine x 2)     Status: None   Collection Time: 08/05/23  5:42 AM   Specimen: BLOOD  Result Value Ref Range Status   Specimen Description   Final    BLOOD RIGHT ANTECUBITAL Performed at [facility], [address] [place], [place] [address]    Special Requests   Final    BOTTLES DRAWN AEROBIC AND ANAEROBIC Blood Culture adequate volume Performed at [facility], [address], [place], [place] [address]    Culture   Final  NO GROWTH 5 DAYS Performed at [facility], [address], [place], [place] [address]    Report Status 08/10/2023 FINAL  Final  MRSA Next Gen by PCR, Nasal     Status: None   Collection Time: 08/05/23  2:35 PM   Specimen: Nasal Mucosa; Nasal Swab  Result Value Ref Range Status   MRSA by PCR Next Gen NOT DETECTED NOT DETECTED Final    Comment:  (NOTE) The GeneXpert MRSA Assay (FDA approved for NASAL specimens only), is one component of a comprehensive MRSA colonization surveillance program. It is not intended to diagnose MRSA infection nor to guide or monitor treatment for MRSA infections. Test performance is not FDA approved in patients less than 46 years old. Performed at [facility], [address], [place], [place] [address]   Body fluid culture w Gram Stain     Status: None   Collection Time: 08/06/23 12:23 PM   Specimen: Pleura; Body Fluid  Result Value Ref Range Status   Specimen Description   Final    PLEURAL Performed at [facility], [address], [place], [place] [address]    Special Requests   Final    Normal Performed at [facility], [address] [place], [place] [address]    Gram Stain   Final    RARE WBC PRESENT,BOTH PMN AND MONONUCLEAR NO ORGANISMS SEEN    Culture   Final    NO GROWTH 3 DAYS Performed at [facility], [address], [place], [place] [address]    Report Status 08/09/2023 FINAL  Final   Labs: CBC: Recent Labs  Lab 08/07/23 0255 08/08/23 0533 08/09/23 0504 08/10/23 0442 08/11/23 0910  WBC 4.8 3.5* 4.7 4.8 4.9  NEUTROABS  --   --   --   --  3.0  HGB 8.7* 8.8* 8.4* 8.6* 9.6*  HCT 28.0* 26.6* 26.6* 27.7* 31.3*  MCV 94.0 90.8 92.0 93.9 96.0  PLT 328 309 296 279 298   Basic Metabolic Panel: Recent Labs  Lab 08/06/23 0041 08/08/23 0533 08/11/23 0910  NA 136 137 133*  K 4.1 3.7 4.0  CL 98 98 96*  CO2 28 28 26   GLUCOSE 92 84 86  BUN 32* 21 19  CREATININE 1.27* 1.22 1.29*  CALCIUM 8.5* 8.8* 8.8*  MG  --   --  1.9  PHOS  --   --  3.2   Liver Function Tests: Recent Labs  Lab 08/11/23 0910  AST 22  ALT 17  ALKPHOS 42  BILITOT 0.7  PROT 6.6  ALBUMIN 2.9*   CBG: No results for input(s): "GLUCAP" in the last 168 hours.  Discharge time spent: greater than 30 minutes.  Signed: Marguerita Merles,  DO [facility] 08/12/2023

## 2023-08-12 NOTE — Plan of Care (Signed)

## 2023-08-12 NOTE — TOC Transition Note (Addendum)
Transition of Care [facility]) - Discharge Note   Patient Details  Name: Harry Norris MRN: [id number] Date of Birth: June 22, 1936  Transition of Care [facility]) CM/SW Contact:  Otelia Santee, LCSW Phone Number: 08/12/2023, 9:39 AM   Clinical Narrative:    Pt's insurance approved for SNF. Pt is able to transfer to [facility] and will be going to room 211. RN to call report to [phone] and request to speak to 200 hall nurse. DC packet placed at RN station. Pt's son to provide transportation to SNF.  ADDENDUM: Son no longer able to pick pt up. [facility] called for transport at 11:02am.   Final next level of care: Skilled Nursing Facility Barriers to Discharge: Barriers Resolved   Patient Goals and CMS Choice Patient states their goals for this hospitalization and ongoing recovery are:: per pt's son Harry Norris plan for pt to d/c to SNF CMS Medicare.gov Compare Post Acute Care list provided to:: Patient Represenative (must comment) Harry Norris (son))        Discharge Placement   Existing PASRR number confirmed : 08/08/23          Patient chooses bed at: Other - please specify in the comment section below: ([facility]) Patient to be transferred to facility by: Son Name of family member notified: Harry Norris Patient and family notified of of transfer: 08/12/23  Discharge Plan and Services Additional resources added to the After Visit Summary for     Discharge Planning Services: CM Consult                                 Social Drivers of Health (SDOH) Interventions SDOH Screenings   Food Insecurity: No Food Insecurity (08/06/2023)  Housing: Low Risk  (08/06/2023)  Transportation Needs: No Transportation Needs (08/06/2023)  Utilities: Not At Risk (08/06/2023)  Depression (PHQ2-9): Low Risk  (08/07/2021)  Tobacco Use: Medium Risk (08/05/2023)     Readmission Risk Interventions    08/06/2023    2:21 PM 08/02/2022    2:14 PM 09/29/2021     5:07 PM  Readmission Risk Prevention Plan  Transportation Screening Complete Complete Complete  PCP or Specialist Appt within 3-5 Days  Complete   HRI or Home Care Consult  Complete Not Complete  HRI or Home Care Consult comments   Pending son's decision about 24 hr homecare v/s ALF  Social Work Consult for Recovery Care Planning/Counseling  Complete Not Complete  SW consult not completed comments   Pending son's decision about 24 hr homecare v/s ALF  Palliative Care Screening  Not Applicable Not Applicable  Medication Review (RN Care Manager) Complete Complete Complete  PCP or Specialist appointment within 3-5 days of discharge Complete    HRI or Home Care Consult Complete    SW Recovery Care/Counseling Consult Complete    Palliative Care Screening Not Applicable    Skilled Nursing Facility Not Applicable

## 2023-08-12 NOTE — Plan of Care (Signed)
  Problem: Education: Goal: Knowledge of General Education information will improve Description: Including pain rating scale, medication(s)/side effects and non-pharmacologic comfort measures Outcome: Progressing   Problem: Health Behavior/Discharge Planning: Goal: Ability to manage health-related needs will improve Outcome: Progressing   Problem: Clinical Measurements: Goal: Ability to maintain clinical measurements within normal limits will improve Outcome: Progressing Goal: Will remain free from infection Outcome: Progressing Goal: Diagnostic test results will improve Outcome: Progressing Goal: Respiratory complications will improve Outcome: Progressing Goal: Cardiovascular complication will be avoided Outcome: Progressing   Problem: Activity: Goal: Risk for activity intolerance will decrease Outcome: Progressing   Problem: Nutrition: Goal: Adequate nutrition will be maintained Outcome: Progressing   Problem: Coping: Goal: Level of anxiety will decrease Outcome: Progressing   Problem: Elimination: Goal: Will not experience complications related to bowel motility Outcome: Progressing Goal: Will not experience complications related to urinary retention Outcome: Progressing   Problem: Pain Management: Goal: General experience of comfort will improve Outcome: Progressing   Problem: Safety: Goal: Ability to remain free from injury will improve Outcome: Progressing   Problem: Skin Integrity: Goal: Risk for impaired skin integrity will decrease Outcome: Progressing   Problem: Nutrition Goal: Nutritional status is improving Description: Monitor and assess patient for malnutrition (ex- brittle hair, bruises, dry skin, pale skin and conjunctiva, muscle wasting, smooth red tongue, and disorientation). Collaborate with interdisciplinary team and initiate plan and interventions as ordered.  Monitor patient's weight and dietary intake as ordered or per policy. Utilize  nutrition screening tool and intervene per policy. Determine patient's food preferences and provide high-protein, high-caloric foods as appropriate.  Outcome: Progressing

## 2023-08-13 ENCOUNTER — Ambulatory Visit: Payer: Medicare Other | Admitting: Cardiology

## 2023-08-13 DIAGNOSIS — I214 Non-ST elevation (NSTEMI) myocardial infarction: Secondary | ICD-10-CM | POA: Diagnosis not present

## 2023-08-13 DIAGNOSIS — R7989 Other specified abnormal findings of blood chemistry: Secondary | ICD-10-CM | POA: Diagnosis not present

## 2023-08-13 DIAGNOSIS — I5022 Chronic systolic (congestive) heart failure: Secondary | ICD-10-CM | POA: Diagnosis not present

## 2023-08-13 DIAGNOSIS — J9620 Acute and chronic respiratory failure, unspecified whether with hypoxia or hypercapnia: Secondary | ICD-10-CM | POA: Diagnosis not present

## 2023-08-16 DIAGNOSIS — I21A1 Myocardial infarction type 2: Secondary | ICD-10-CM | POA: Diagnosis not present

## 2023-08-16 DIAGNOSIS — J9620 Acute and chronic respiratory failure, unspecified whether with hypoxia or hypercapnia: Secondary | ICD-10-CM | POA: Diagnosis not present

## 2023-08-16 DIAGNOSIS — I214 Non-ST elevation (NSTEMI) myocardial infarction: Secondary | ICD-10-CM | POA: Diagnosis not present

## 2023-08-16 DIAGNOSIS — R7989 Other specified abnormal findings of blood chemistry: Secondary | ICD-10-CM | POA: Diagnosis not present

## 2023-08-17 NOTE — Progress Notes (Signed)
Chart was reviewed and during the encounter in question, the patient was complaining of cough, altered mental status, and fatigue and given the ongoing pandemic, he had a viral swab to check for COVID/flu/RSV.

## 2023-08-20 DIAGNOSIS — J9620 Acute and chronic respiratory failure, unspecified whether with hypoxia or hypercapnia: Secondary | ICD-10-CM | POA: Diagnosis not present

## 2023-08-20 DIAGNOSIS — R7989 Other specified abnormal findings of blood chemistry: Secondary | ICD-10-CM | POA: Diagnosis not present

## 2023-08-20 DIAGNOSIS — I21A1 Myocardial infarction type 2: Secondary | ICD-10-CM | POA: Diagnosis not present

## 2023-08-20 DIAGNOSIS — I214 Non-ST elevation (NSTEMI) myocardial infarction: Secondary | ICD-10-CM | POA: Diagnosis not present

## 2023-08-30 ENCOUNTER — Other Ambulatory Visit (HOSPITAL_COMMUNITY): Payer: Self-pay

## 2023-08-30 ENCOUNTER — Other Ambulatory Visit: Payer: Self-pay

## 2023-09-06 DIAGNOSIS — J449 Chronic obstructive pulmonary disease, unspecified: Secondary | ICD-10-CM | POA: Diagnosis not present

## 2023-09-06 DIAGNOSIS — J439 Emphysema, unspecified: Secondary | ICD-10-CM | POA: Diagnosis not present

## 2023-09-06 DIAGNOSIS — J9601 Acute respiratory failure with hypoxia: Secondary | ICD-10-CM | POA: Diagnosis not present

## 2023-09-15 DIAGNOSIS — I5022 Chronic systolic (congestive) heart failure: Secondary | ICD-10-CM | POA: Diagnosis not present

## 2023-09-15 DIAGNOSIS — D509 Iron deficiency anemia, unspecified: Secondary | ICD-10-CM | POA: Diagnosis not present

## 2023-09-15 DIAGNOSIS — K219 Gastro-esophageal reflux disease without esophagitis: Secondary | ICD-10-CM | POA: Diagnosis not present

## 2023-09-15 DIAGNOSIS — E785 Hyperlipidemia, unspecified: Secondary | ICD-10-CM | POA: Diagnosis not present

## 2023-09-15 DIAGNOSIS — J449 Chronic obstructive pulmonary disease, unspecified: Secondary | ICD-10-CM | POA: Diagnosis not present

## 2023-09-15 DIAGNOSIS — G4700 Insomnia, unspecified: Secondary | ICD-10-CM | POA: Diagnosis not present

## 2023-10-07 DIAGNOSIS — J439 Emphysema, unspecified: Secondary | ICD-10-CM | POA: Diagnosis not present

## 2023-10-07 DIAGNOSIS — J9601 Acute respiratory failure with hypoxia: Secondary | ICD-10-CM | POA: Diagnosis not present

## 2023-10-07 DIAGNOSIS — J449 Chronic obstructive pulmonary disease, unspecified: Secondary | ICD-10-CM | POA: Diagnosis not present

## 2023-10-20 DIAGNOSIS — L299 Pruritus, unspecified: Secondary | ICD-10-CM | POA: Diagnosis not present

## 2023-10-20 DIAGNOSIS — D509 Iron deficiency anemia, unspecified: Secondary | ICD-10-CM | POA: Diagnosis not present

## 2023-10-20 DIAGNOSIS — I5022 Chronic systolic (congestive) heart failure: Secondary | ICD-10-CM | POA: Diagnosis not present

## 2023-10-20 DIAGNOSIS — K219 Gastro-esophageal reflux disease without esophagitis: Secondary | ICD-10-CM | POA: Diagnosis not present

## 2023-10-20 DIAGNOSIS — J449 Chronic obstructive pulmonary disease, unspecified: Secondary | ICD-10-CM | POA: Diagnosis not present

## 2023-10-20 DIAGNOSIS — G4700 Insomnia, unspecified: Secondary | ICD-10-CM | POA: Diagnosis not present

## 2023-10-20 DIAGNOSIS — E785 Hyperlipidemia, unspecified: Secondary | ICD-10-CM | POA: Diagnosis not present

## 2023-10-28 DIAGNOSIS — I890 Lymphedema, not elsewhere classified: Secondary | ICD-10-CM | POA: Diagnosis not present

## 2023-10-28 DIAGNOSIS — I872 Venous insufficiency (chronic) (peripheral): Secondary | ICD-10-CM | POA: Diagnosis not present

## 2023-11-04 DIAGNOSIS — J439 Emphysema, unspecified: Secondary | ICD-10-CM | POA: Diagnosis not present

## 2023-11-04 DIAGNOSIS — J449 Chronic obstructive pulmonary disease, unspecified: Secondary | ICD-10-CM | POA: Diagnosis not present

## 2023-11-04 DIAGNOSIS — J9601 Acute respiratory failure with hypoxia: Secondary | ICD-10-CM | POA: Diagnosis not present

## 2023-11-10 DIAGNOSIS — G4700 Insomnia, unspecified: Secondary | ICD-10-CM | POA: Diagnosis not present

## 2023-11-10 DIAGNOSIS — L299 Pruritus, unspecified: Secondary | ICD-10-CM | POA: Diagnosis not present

## 2023-11-10 DIAGNOSIS — E785 Hyperlipidemia, unspecified: Secondary | ICD-10-CM | POA: Diagnosis not present

## 2023-11-10 DIAGNOSIS — D509 Iron deficiency anemia, unspecified: Secondary | ICD-10-CM | POA: Diagnosis not present

## 2023-11-10 DIAGNOSIS — K219 Gastro-esophageal reflux disease without esophagitis: Secondary | ICD-10-CM | POA: Diagnosis not present

## 2023-11-10 DIAGNOSIS — J449 Chronic obstructive pulmonary disease, unspecified: Secondary | ICD-10-CM | POA: Diagnosis not present

## 2023-11-10 DIAGNOSIS — I5022 Chronic systolic (congestive) heart failure: Secondary | ICD-10-CM | POA: Diagnosis not present

## 2023-11-19 DIAGNOSIS — K219 Gastro-esophageal reflux disease without esophagitis: Secondary | ICD-10-CM | POA: Diagnosis not present

## 2023-11-19 DIAGNOSIS — J449 Chronic obstructive pulmonary disease, unspecified: Secondary | ICD-10-CM | POA: Diagnosis not present

## 2023-11-19 DIAGNOSIS — E785 Hyperlipidemia, unspecified: Secondary | ICD-10-CM | POA: Diagnosis not present

## 2023-11-19 DIAGNOSIS — I5022 Chronic systolic (congestive) heart failure: Secondary | ICD-10-CM | POA: Diagnosis not present

## 2023-11-26 DIAGNOSIS — D509 Iron deficiency anemia, unspecified: Secondary | ICD-10-CM | POA: Diagnosis not present

## 2023-12-05 DIAGNOSIS — J439 Emphysema, unspecified: Secondary | ICD-10-CM | POA: Diagnosis not present

## 2023-12-05 DIAGNOSIS — J9601 Acute respiratory failure with hypoxia: Secondary | ICD-10-CM | POA: Diagnosis not present

## 2023-12-05 DIAGNOSIS — J449 Chronic obstructive pulmonary disease, unspecified: Secondary | ICD-10-CM | POA: Diagnosis not present

## 2023-12-10 DIAGNOSIS — I5022 Chronic systolic (congestive) heart failure: Secondary | ICD-10-CM | POA: Diagnosis not present

## 2023-12-10 DIAGNOSIS — G4700 Insomnia, unspecified: Secondary | ICD-10-CM | POA: Diagnosis not present

## 2023-12-10 DIAGNOSIS — K219 Gastro-esophageal reflux disease without esophagitis: Secondary | ICD-10-CM | POA: Diagnosis not present

## 2023-12-17 ENCOUNTER — Telehealth: Payer: Self-pay | Admitting: Neurology

## 2023-12-17 NOTE — Telephone Encounter (Signed)
 Appointment details confirmed

## 2023-12-24 DIAGNOSIS — D509 Iron deficiency anemia, unspecified: Secondary | ICD-10-CM | POA: Diagnosis not present

## 2023-12-26 NOTE — Progress Notes (Unsigned)
 [facility]  PATIENT: Harry Norris DOB: 1936/03/15  REFERRING DOCTOR OR PCP: Deanie Ewings, MD; Erman Hayward, DO SOURCE: Patient, daughter-in-law, notes from recent hospitalization, notes from primary care, imaging and lab reports, imaging personally reviewed  _________________________________   HISTORICAL  CHIEF COMPLAINT:  Chief Complaint  Patient presents with   New Patient (Initial Visit)    Pt in room 10. Daughter in law in room. Paper referral for eval for dementia. Pt reports has been good. Moca:9.     HISTORY OF PRESENT ILLNESS:  I had the pleasure of seeing your patient, Harry Norris, at [facility] for neurologic consultation regarding his memory decline.  He is an 88 yo man who had a significant decline in cognitive function after a series of presentations in November/December 2024. \ Memory issues at baseline were milder -- needed reminders to take medications and assistance with complex tasks.  He was driving.  He had a severe pneumonia requiring ventilator support in 2024. After several hospitalizations for respiratory symptoms, memory seemed much worse.   He was no longer able to live independently.  He is living at [facility] now and no longer driving.   He is very contradictory in regards to his current issues and assistance required.       Gait is mildly unsteady.   He uses a walking stick.  No falls.         12/29/2023    1:02 PM 12/29/2023   12:47 PM  Montreal Cognitive Assessment   Visuospatial/ Executive (0/5)  2  Naming (0/3)  3  Attention: Read list of digits (0/2)  1  Attention: Read list of letters (0/1)  1  Attention: Serial 7 subtraction starting at 100 (0/3) 0 0  Language: Repeat phrase (0/2)  0  Language : Fluency (0/1)  0  Abstraction (0/2)  0  Delayed Recall (0/5)  0  Orientation (0/6)  2  Total  9   Labs 08/2023:  TSH was normal.   B12 low normal 2025.    CT scan of the  head 08/05/2023 showed mild generalized cortical atrophy.  There is cavum septum pellucidum.  Chronic lacunar infarction in the right caudate .  Calcification is noted within the internal  REVIEW OF SYSTEMS: Constitutional: No fevers, chills, sweats, or change in appetite Eyes: No visual changes, double vision, eye pain Ear, nose and throat: No hearing loss, ear pain, nasal congestion, sore throat Cardiovascular: No chest pain, palpitations Respiratory: He has some dyspnea on exertion.  He a smoking history though no longer smokes.  He has recent pneumonia.   GastrointestinaI: No nausea, vomiting, diarrhea, abdominal pain, fecal incontinence Genitourinary:  No dysuria, urinary retention or frequency.  No nocturia. Musculoskeletal:  No neck pain, back pain Integumentary: No rash, pruritus, skin lesions Neurological: as above Psychiatric: No depression at this time.  No anxiety Endocrine: No palpitations, diaphoresis, change in appetite, change in weigh or increased thirst Hematologic/Lymphatic:  No anemia, purpura, petechiae. Allergic/Immunologic: No itchy/runny eyes, nasal congestion, recent allergic reactions, rashes  ALLERGIES: Allergies  Allergen Reactions   Ace Inhibitors Cough    HOME MEDICATIONS:  Current Outpatient Medications:    acetaminophen  (TYLENOL ) 325 MG tablet, Take 2 tablets (650 mg total) by mouth every 6 (six) hours as needed for mild pain (pain score 1-3) (or Fever >/= 101)., Disp: 20 tablet, Rfl: 0   albuterol  (VENTOLIN  HFA) 108 (90 Base) MCG/ACT inhaler, Inhale 2 puffs into the lungs every 6 (  six) hours as needed for wheezing or shortness of breath., Disp: , Rfl:    aspirin  EC 81 MG tablet, Take 81 mg by mouth in the morning., Disp: , Rfl:    atorvastatin  (LIPITOR) 40 MG tablet, Take 40 mg by mouth every evening., Disp: , Rfl:    azithromycin  (ZITHROMAX ) 250 MG tablet, Take one available., Disp: , Rfl:    fenofibrate  160 MG tablet, Take 160 mg by mouth in the  morning., Disp: , Rfl:    FLOVENT HFA 110 MCG/ACT inhaler, Inhale 2 puffs into the lungs 2 (two) times daily as needed (Shortness of breath)., Disp: , Rfl:    furosemide  (LASIX ) 20 MG tablet, Take 1 tablet (20 mg total) by mouth daily., Disp: 60 tablet, Rfl: 0   guaiFENesin -dextromethorphan  (ROBITUSSIN DM) 100-10 MG/5ML syrup, Take 5 mLs by mouth every 4 (four) hours as needed for cough (chest congestion)., Disp: , Rfl:    hydrOXYzine  (ATARAX ) 25 MG tablet, Take 1 tablet (25 mg total) by mouth every 8 (eight) hours as needed., Disp: , Rfl:    ipratropium-albuterol  (DUONEB) 0.5-2.5 (3) MG/3ML SOLN, Take 3 mLs by nebulization in the morning and at bedtime., Disp: , Rfl:    loperamide  (IMODIUM ) 2 MG capsule, Take 1 capsule (2 mg total) by mouth as needed for diarrhea or loose stools., Disp: , Rfl:    melatonin 5 MG TABS, Take 5-10 mg by mouth at bedtime., Disp: , Rfl:    Multiple Vitamins-Minerals (CENTRUM SILVER 50+MEN) TABS, Take 1 tablet by mouth in the morning., Disp: , Rfl:    Omega-3 Fatty Acids (FISH OIL) 1000 MG CAPS, Take 1,000 mg by mouth in the morning and at bedtime., Disp: , Rfl:    OXYGEN , Inhale 2.5-3 L/min into the lungs as needed (for shortness of breath)., Disp: , Rfl:    pantoprazole  (PROTONIX ) 40 MG tablet, Take 40 mg by mouth 2 (two) times daily., Disp: , Rfl:    vitamin E 180 MG (400 UNITS) capsule, Take 400 Units by mouth daily., Disp: , Rfl:    donepezil (ARICEPT) 5 MG tablet, Take 1 tablet (5 mg total) by mouth at bedtime., Disp: 30 tablet, Rfl: 0  PAST MEDICAL HISTORY: Past Medical History:  Diagnosis Date   AAA (abdominal aortic aneurysm) (HCC)    Acute hypoxemic respiratory failure (HCC) 07/12/2021   Anemia    Asthma 02/07/2018   Benign neoplasm of colon    Blood in the stool    Bradycardia 12/19/2018   Bronchitis    Cancer (HCC)    prostate   Cardiomyopathy (HCC) 12/18/2015   Carotid artery occlusion    Chronic obstructive pulmonary disease with acute  exacerbation (HCC) 07/12/2021   Chronic rhinitis 02/26/2016   Chronic venous insufficiency 12/18/2015   CKD (chronic kidney disease), stage III (HCC) 07/12/2021   Community acquired pneumonia of right lower lobe of lung 07/12/2021   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   PFTs 01/18/14:  FeV1 1.58 45% FVC 2.0 50% FeV1/FVC 79% Fef 25-75 795 ( 40% improved after BD) CXR: Copd changes ONO 03/07/14 desats to <88% on RA    Diarrhea    Diverticulitis    DM type 2 (diabetes mellitus, type 2) (HCC)    patient denies every being diagnosed with diabetes   Fatigue 12/12/2018   Hyperlipidemia    Hypertension    Hypertensive heart disease    Hypoxemia 04/10/2014   Overview:  Last Assessment & Plan:  Cont nocturnal oxygen  therapy 2L  Influenza 07/29/2021   Kidney stone    LBBB (left bundle branch block) 12/18/2015   Leg swelling 11/28/2013   Orthostatic hypotension 12/19/2018   Other symptoms involving cardiovascular system 11/28/2013   Pressure injury of skin 07/30/2021   Prostate cancer [facility])    prostate    Renal insufficiency 09/26/2021   Respiratory failure (HCC) 07/29/2021   Sepsis (HCC) 07/12/2021   Syncope 12/18/2015   Varicose veins of lower extremities with other complications 11/28/2013    PAST SURGICAL HISTORY: Past Surgical History:  Procedure Laterality Date    cardiac catheterization  12/29/2009   CATARACT EXTRACTION Bilateral    ECTROPION REPAIR Bilateral 03/27/2022   Procedure: REPAIR OF ECTROPION;  Surgeon: Debbra Fairy, MD;  Location: [facility] OR;  Service: Ophthalmology;  Laterality: Bilateral;   ENDOVENOUS ABLATION SAPHENOUS VEIN W/ LASER Right 03/29/2014   EVLA right greater saphenous vein     gold seed implants  08/31/2009   treatment of prostate cancer   HERNIA REPAIR Bilateral    LITHOTRIPSY  08/31/2008   PROSTATE SURGERY     pt reports he "think they removed" his prostate   right ear surgery      FAMILY HISTORY: Family History  Problem Relation Age of  Onset   Cancer - Colon Mother        deceased   Hypertension Mother    Diabetes Mother    Emphysema Sister        deceased   Heart disease Sister    Asthma Sister    Kidney failure Father    Heart failure Father     SOCIAL HISTORY: Social History   Socioeconomic History   Marital status: Married    Spouse name: Not on file   Number of children: Not on file   Years of education: Not on file   Highest education level: Not on file  Occupational History   Not on file  Tobacco Use   Smoking status: Former    Current packs/day: 0.00    Average packs/day: 2.5 packs/day for 40.0 years (100.0 ttl pk-yrs)    Types: Cigarettes    Start date: 09/01/1959    Quit date: 09/01/1999    Years since quitting: 24.3   Smokeless tobacco: Never  Vaping Use   Vaping status: Never Used  Substance and Sexual Activity   Alcohol use: No   Drug use: No   Sexual activity: Not on file  Other Topics Concern   Not on file  Social History Narrative   Pt lives at [facility].    Social Drivers of Corporate investment banker Strain: Not on file  Food Insecurity: No Food Insecurity (08/06/2023)   Hunger Vital Sign    Worried About Running Out of Food in the Last Year: Never true    Ran Out of Food in the Last Year: Never true  Transportation Needs: No Transportation Needs (08/06/2023)   PRAPARE - Administrator, Civil Service (Medical): No    Lack of Transportation (Non-Medical): No  Physical Activity: Not on file  Stress: Not on file  Social Connections: Not on file  Intimate Partner Violence: Not At Risk (08/06/2023)   Humiliation, Afraid, Rape, and Kick questionnaire    Fear of Current or Ex-Partner: No    Emotionally Abused: No    Physically Abused: No    Sexually Abused: No       PHYSICAL EXAM  Vitals:   12/29/23 1242  BP: 136/60  Pulse: 96  Weight: 147 lb (66.7 kg)  Height: 5\' 8"  (1.727 m)    Body mass index is 22.35 kg/m.   General: The patient  is well-developed and well-nourished and in no acute distress  HEENT:  Head is [place]/AT.  Sclera are anicteric.  Funduscopic exam shows normal optic discs and retinal vessels.  Neck: No carotid bruits are noted.  The neck is nontender.  Cardiovascular: The heart has a regular rate and rhythm with a normal S1 and S2. There were no murmurs, gallops or rubs.    Skin: Has mild edema and dry skin at the feet  Musculoskeletal:  Back is nontender  Neurologic Exam  Mental status: The patient is alert and oriented x 2 at the time of the examination.  Short-term memory and attention were reduced speech is normal.  Cranial nerves: Extraocular movements are full.  .  Facial symmetry is present. There is good facial sensation to soft touch bilaterally.Facial strength is normal.  Trapezius and sternocleidomastoid strength is normal. No dysarthria is noted.  The tongue is midline, and the patient has symmetric elevation of the soft palate.  Mild hearing deficits noted  Motor:  Muscle bulk is normal.   Tone is normal. Strength is  5 / 5 in all 4 extremities.   Sensory: Sensory testing is intact to pinprick, soft touch and vibration sensation in all 4 extremities.  Coordination: Cerebellar testing reveals good finger-nose-finger and heel-to-shin bilaterally.  Gait and station: Station is normal.   He has a mildly reduced stride.  Tandem is poor.  Romberg is negative.   Reflexes: Deep tendon reflexes are symmetric and normal bilaterally.       DIAGNOSTIC DATA (LABS, IMAGING, TESTING) - I reviewed patient records, labs, notes, testing and imaging myself where available.  Lab Results  Component Value Date   WBC 4.9 08/11/2023   HGB 9.6 (L) 08/11/2023   HCT 31.3 (L) 08/11/2023   MCV 96.0 08/11/2023   PLT 298 08/11/2023      Component Value Date/Time   NA 133 (L) 08/11/2023 0910   K 4.0 08/11/2023 0910   CL 96 (L) 08/11/2023 0910   CO2 26 08/11/2023 0910   GLUCOSE 86 08/11/2023 0910   BUN 19  08/11/2023 0910   CREATININE 1.29 (H) 08/11/2023 0910   CALCIUM  8.8 (L) 08/11/2023 0910   PROT 6.6 08/11/2023 0910   ALBUMIN 2.9 (L) 08/11/2023 0910   AST 22 08/11/2023 0910   ALT 17 08/11/2023 0910   ALKPHOS 42 08/11/2023 0910   BILITOT 0.7 08/11/2023 0910   GFRNONAA 54 (L) 08/11/2023 0910   Lab Results  Component Value Date   TRIG 150 (H) 07/30/2021   Lab Results  Component Value Date   HGBA1C 5.8 (H) 07/15/2023   Lab Results  Component Value Date   VITAMINB12 205 07/15/2023   Lab Results  Component Value Date   TSH 1.593 07/14/2023       ASSESSMENT AND PLAN  Mild dementia, unspecified dementia type, unspecified whether behavioral, psychotic, or mood disturbance or anxiety (HCC) - Plan: ATN PROFILE, Vitamin B12  Memory loss  Bilateral carotid artery stenosis   In summary, Harry Norris is an 88 year old man who had had mild cognitive changes before a series of hospitalizations last winter.  Afterwards cognitive issues were more severe and persistent.  CT scan from December 2024 does show mild generalized cortical atrophy though there is no definite lobar predominance.  Additionally, there is a right caudate stroke.  I suspect that he  had cognitive changes from either the vascular changes in the brain or Alzheimer's disease and he took a step down due to the medical issues related to his hospitalization that he has not been able to recover from.  To further evaluate, I will check Alzheimer biomarkers (amyloid beta 42/40 ratio, [id number], neurofilament light) additionally, we will check B12 as it was low normal last year.  I sent in a prescription for donepezil 5 mg.  This can be increased to 10 mg if he tolerates it well.  If Alzheimer biomarkers are positive and cognitive issues worsen, consider adding Namenda.  I did not schedule a follow-up but 1 could be considered if he has new or worsening neurologic symptoms.  Thank you for asking to see Harry Norris.  Please let me know if  I can be of further assistance with him or other patients in the future.   Donnia Poplaski A. Godwin Lat, MD, [facility] 12/29/2023, 1:45 PM Certified in Neurology, Clinical Neurophysiology, Sleep Medicine and Neuroimaging  [facility] [address], [address] [place], [place] [address] [phone]

## 2023-12-29 ENCOUNTER — Ambulatory Visit (INDEPENDENT_AMBULATORY_CARE_PROVIDER_SITE_OTHER): Payer: Medicare Other | Admitting: Neurology

## 2023-12-29 ENCOUNTER — Telehealth: Payer: Self-pay | Admitting: *Deleted

## 2023-12-29 ENCOUNTER — Encounter: Payer: Self-pay | Admitting: Neurology

## 2023-12-29 VITALS — BP 136/60 | HR 96 | Ht 68.0 in | Wt 147.0 lb

## 2023-12-29 DIAGNOSIS — R413 Other amnesia: Secondary | ICD-10-CM

## 2023-12-29 DIAGNOSIS — I6523 Occlusion and stenosis of bilateral carotid arteries: Secondary | ICD-10-CM | POA: Diagnosis not present

## 2023-12-29 DIAGNOSIS — F03A0 Unspecified dementia, mild, without behavioral disturbance, psychotic disturbance, mood disturbance, and anxiety: Secondary | ICD-10-CM | POA: Diagnosis not present

## 2023-12-29 MED ORDER — DONEPEZIL HCL 5 MG PO TABS: 5.0000 mg | ORAL_TABLET | Freq: Every day | ORAL | 0 refills | Status: DC

## 2023-12-29 NOTE — Telephone Encounter (Signed)
 Patient was seen today for office visit rx for donepezil 5 mg was sent to local pharmacy. Pt daughter in law asked if Rx can be printed so they can take to a different pharmacy since patient is currently [facility].   Rx printed and given to Dr.Sater to signed.  I called [facility] in [place] to cancel Rx.

## 2024-01-01 LAB — ATN PROFILE
A -- Beta-amyloid 42/40 Ratio: 0.114 (ref >0.102)
Beta-amyloid 40: 360.07 pg/mL
Beta-amyloid 42: 41.22 pg/mL
N -- NfL, Plasma: 7.1 pg/mL (ref 0.00–9.13)
T -- p-tau181: 2.15 pg/mL — ABNORMAL HIGH (ref 0.00–0.97)

## 2024-01-01 LAB — VITAMIN B12: Vitamin B-12: 429 pg/mL (ref 232–1245)

## 2024-01-04 DIAGNOSIS — J9601 Acute respiratory failure with hypoxia: Secondary | ICD-10-CM | POA: Diagnosis not present

## 2024-01-04 DIAGNOSIS — J449 Chronic obstructive pulmonary disease, unspecified: Secondary | ICD-10-CM | POA: Diagnosis not present

## 2024-01-04 DIAGNOSIS — J439 Emphysema, unspecified: Secondary | ICD-10-CM | POA: Diagnosis not present

## 2024-01-07 DIAGNOSIS — E785 Hyperlipidemia, unspecified: Secondary | ICD-10-CM | POA: Diagnosis not present

## 2024-01-07 DIAGNOSIS — K219 Gastro-esophageal reflux disease without esophagitis: Secondary | ICD-10-CM | POA: Diagnosis not present

## 2024-01-07 DIAGNOSIS — I5022 Chronic systolic (congestive) heart failure: Secondary | ICD-10-CM | POA: Diagnosis not present

## 2024-01-08 IMAGING — CT CT MAXILLOFACIAL W/O CM
3 of 4 series · 16 of 47 positions shown, 19 images · non-contrast
Comparison: Head CT of the same date.

CLINICAL DATA: In 86-year-old male presents for evaluation of
trauma.



[Series 3: facialbone 2.0 st · axial · 0.29mm/px · z∈[-186,-34]mm · 11 of 90 slices shown, 14 images]
[im 7/90  brain]
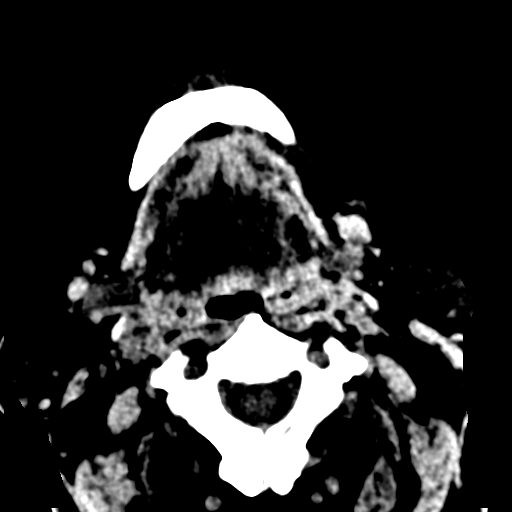
[im 7/90  bone]
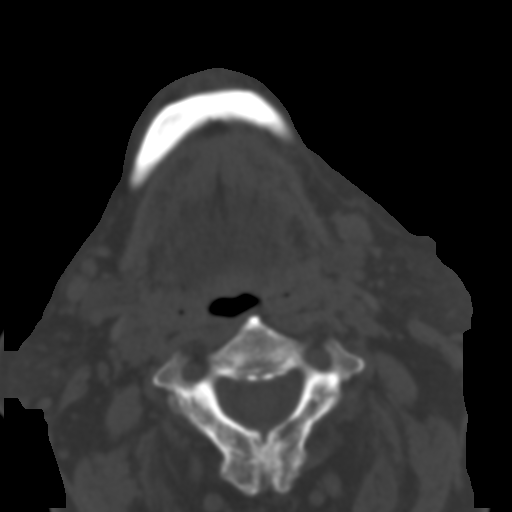
[im 13/90  bone]
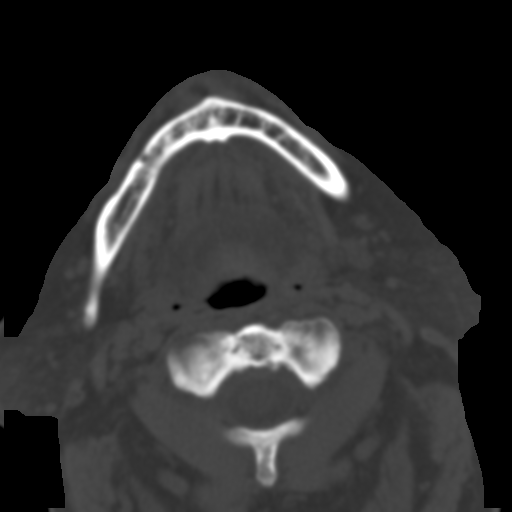
[im 22/90  bone]
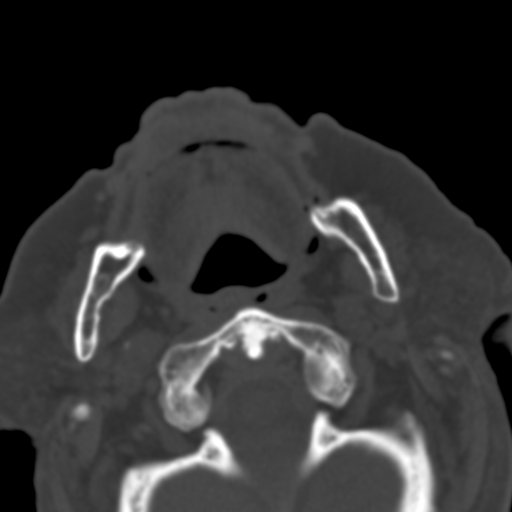
[im 28/90  bone]
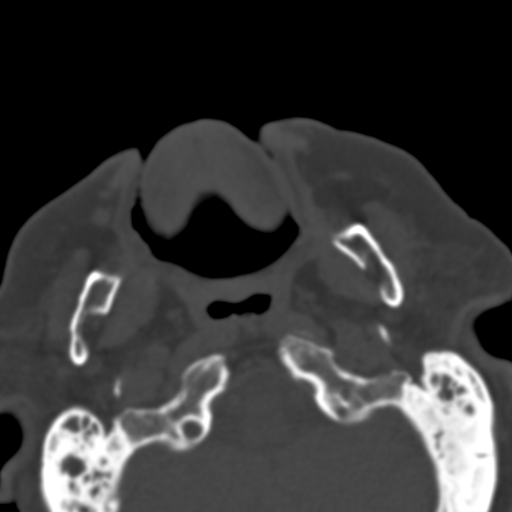
[im 37/90  brain]
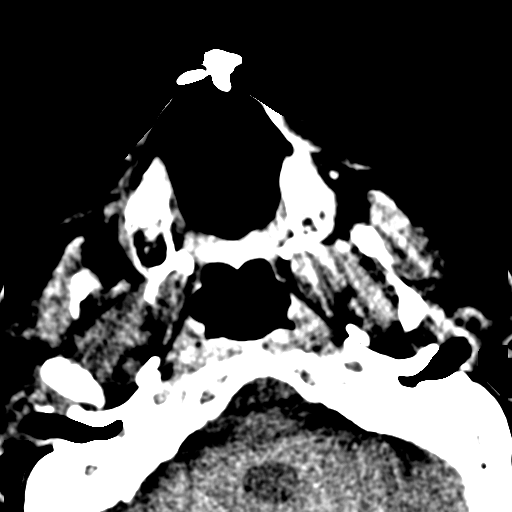
[im 37/90  bone]
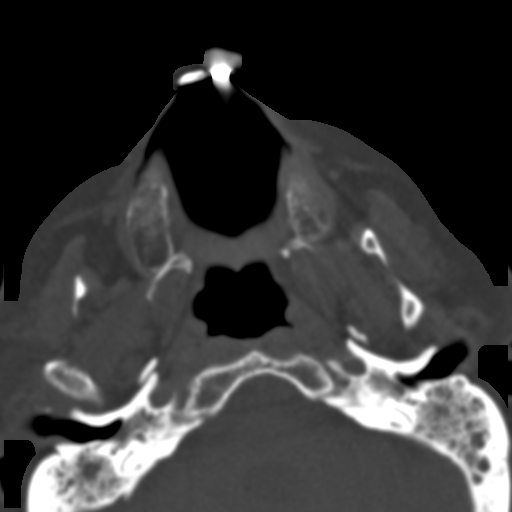
[im 47/90  bone]
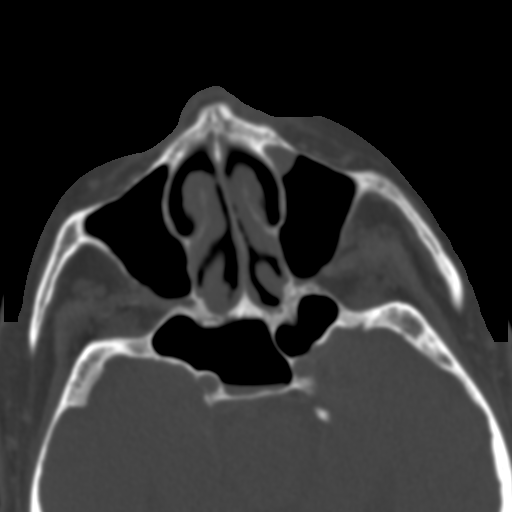
[im 53/90  bone]
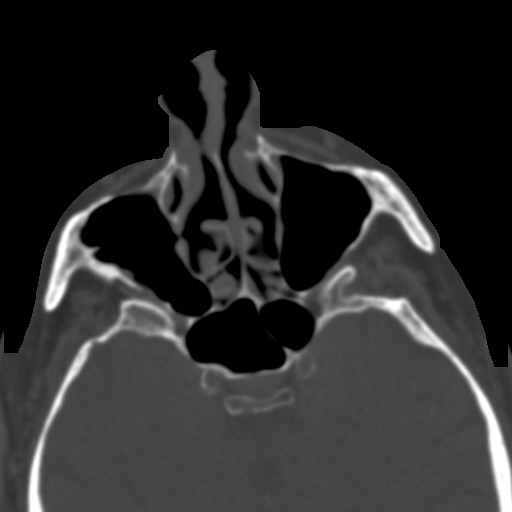
[im 62/90  bone]
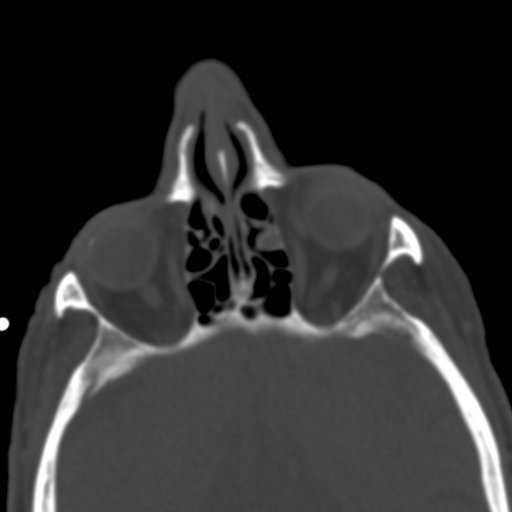
[im 68/90  brain]
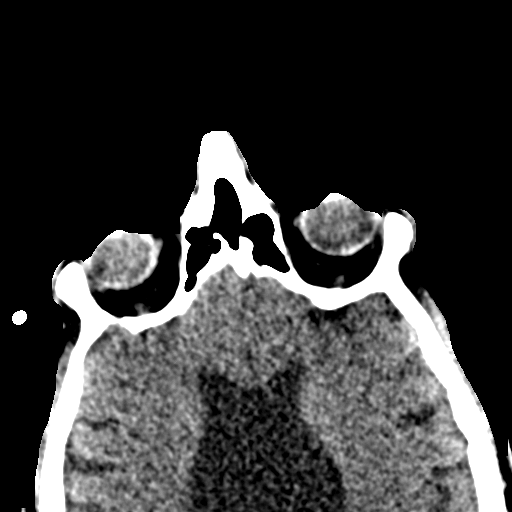
[im 68/90  bone]
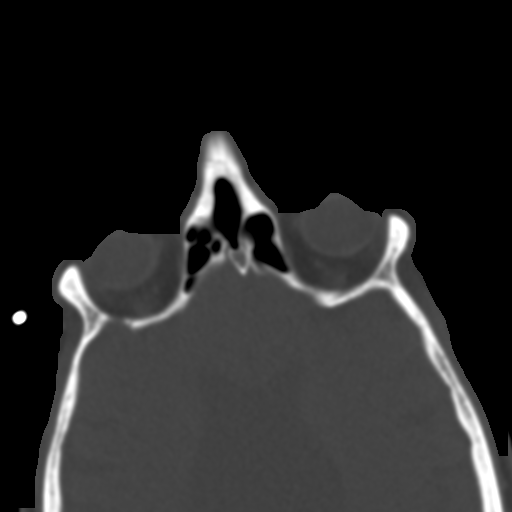
[im 77/90  bone]
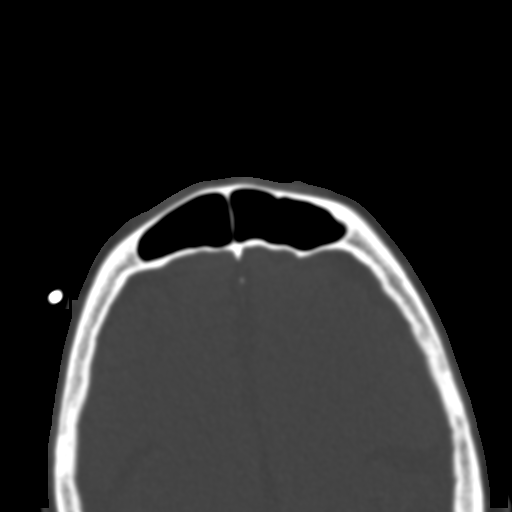
[im 83/90  bone]
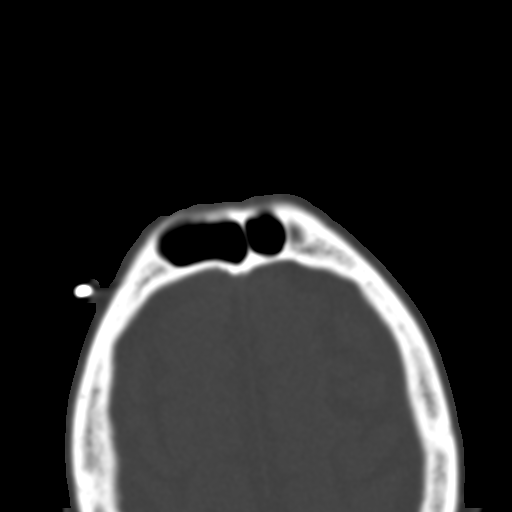

[Series 8: facialbone 2.0 sag st · sagittal · 0.29mm/px · 2 of 87 slices shown]
[im 29/87  bone]
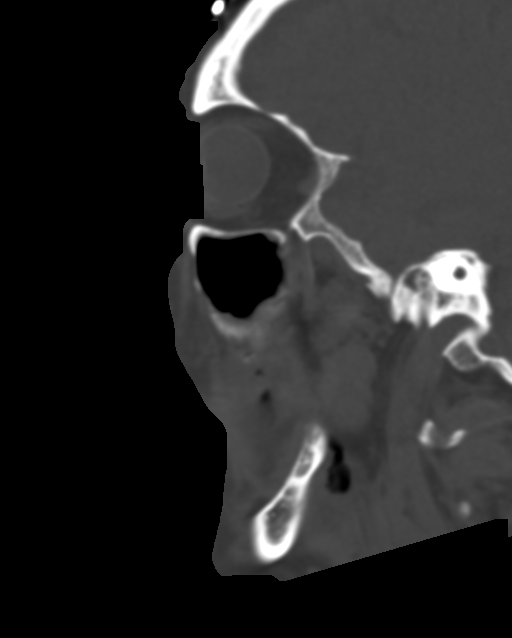
[im 58/87  bone]
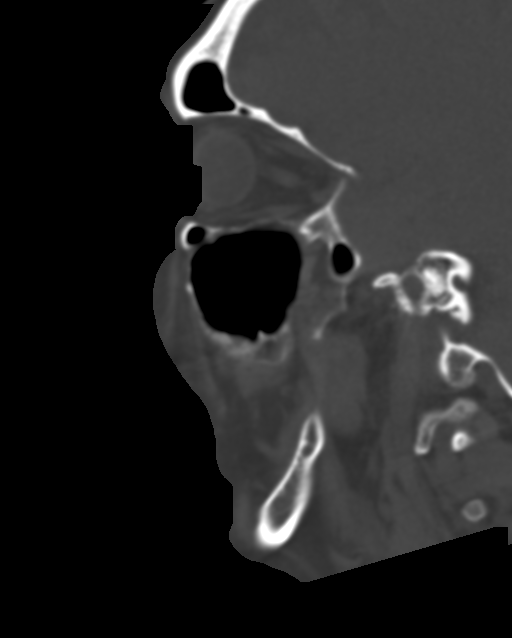

[Series 11: facialbone 2.0 cor st · coronal · 0.34mm/px · 3 of 75 slices shown]
[im 25/75  bone]
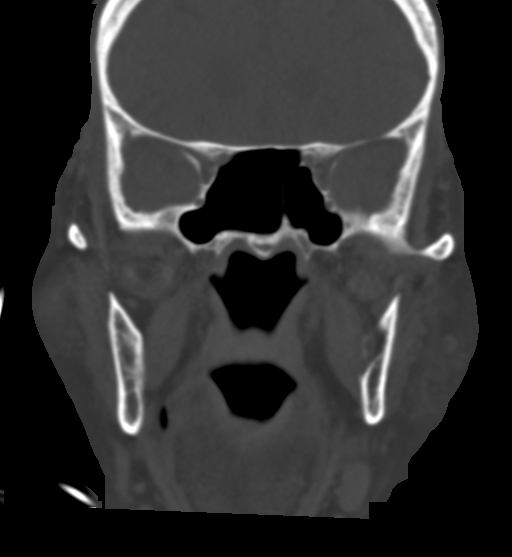
[im 33/75  bone]
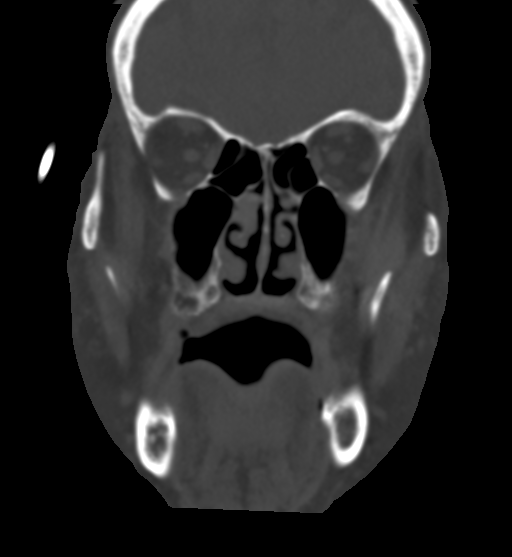
[im 42/75  bone]
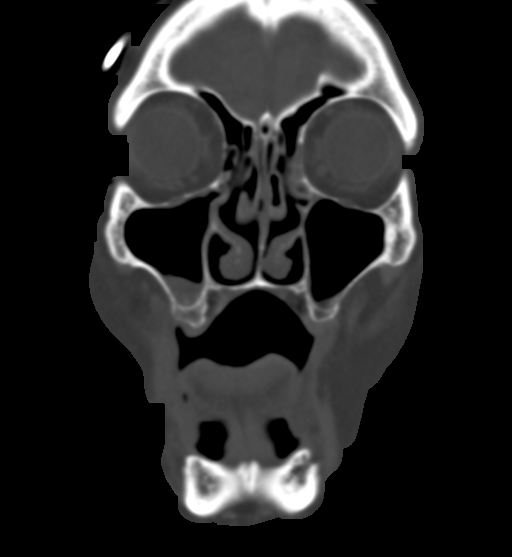

[16 of 47 positions shown; findings below may reference images not displayed]

FINDINGS: Osseous: No fracture or mandibular dislocation. No destructive
process.

Orbits: Negative. No traumatic or inflammatory finding.

Sinuses: Mucosal thickening in the maxillary sinuses. Small amount
of debris in the dependent sphenoid sinus on the RIGHT. Scattered
ethmoid mucosal thickening. No air-fluid levels in the sinuses.
Incidental note is made of bilateral chronic mastoid effusions not
substantially changed since imaging dating back to 1844

Soft tissues: Negative.

Limited intracranial: No significant or unexpected finding.
IMPRESSION: 1. No signs of acute facial bone fracture.
2. Sinus disease and chronic mastoid effusions.

## 2024-01-08 IMAGING — CT CT HEAD W/O CM
2 of 4 series · 10 of 37 positions shown, 12 images · non-contrast
Comparison: None.

CLINICAL DATA: Trauma



[Series 5: head without ax · axial · non-contrast · 0.36mm/px · z∈[-105,+32]mm · 7 of 69 slices shown, 9 images]
[im 8/69  brain]
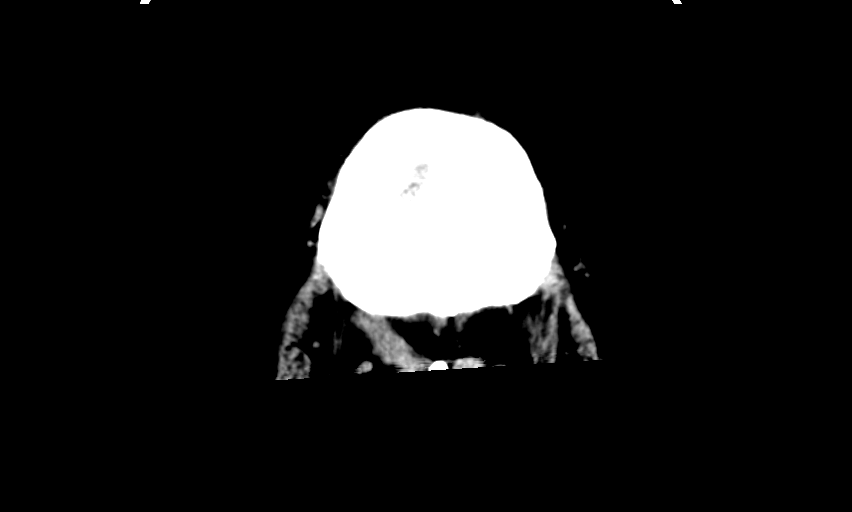
[im 8/69  bone]
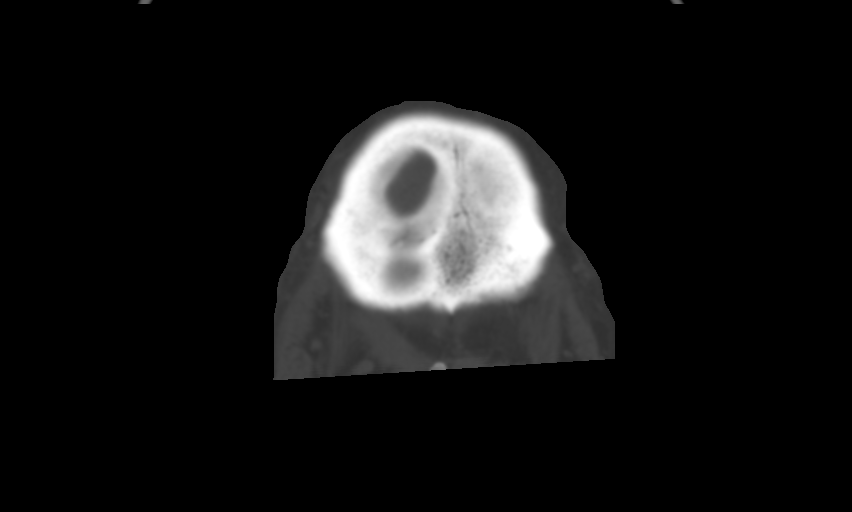
[im 16/69  brain]
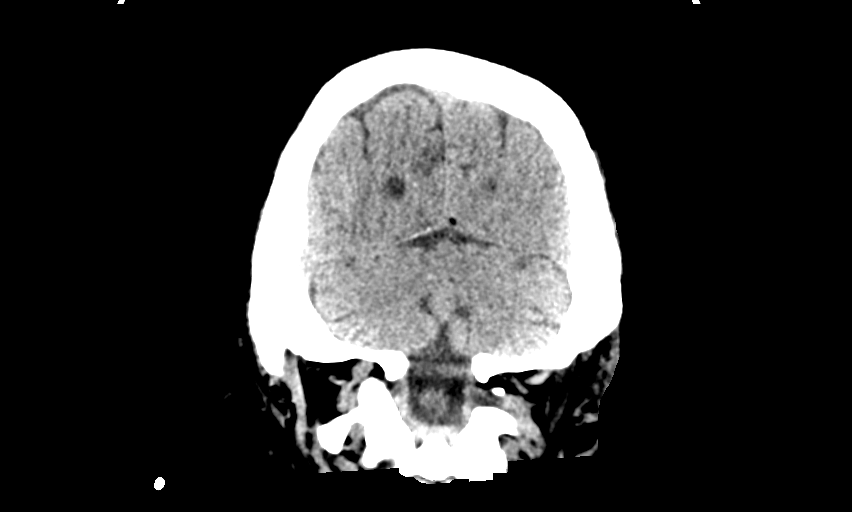
[im 23/69  brain]
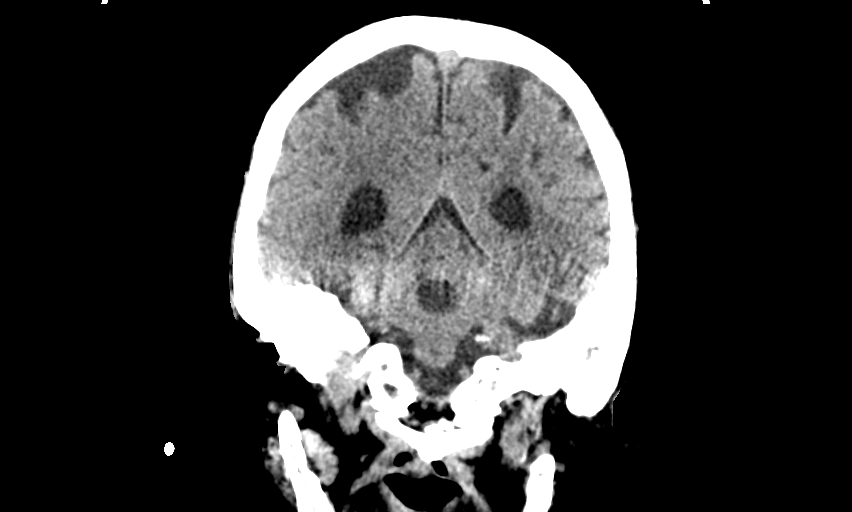
[im 38/69  brain]
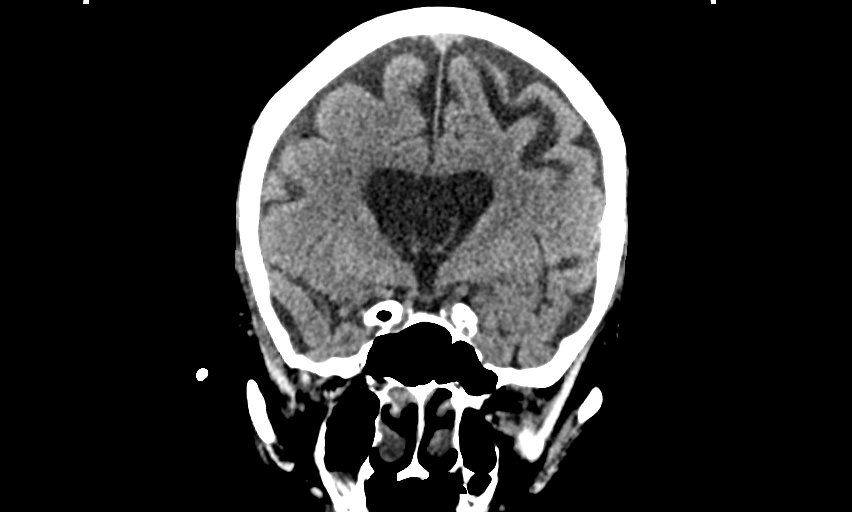
[im 46/69  brain]
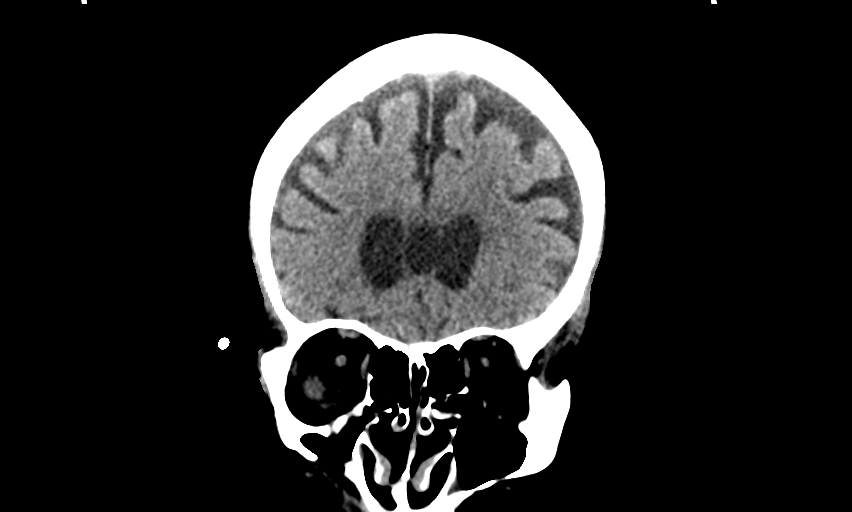
[im 46/69  bone]
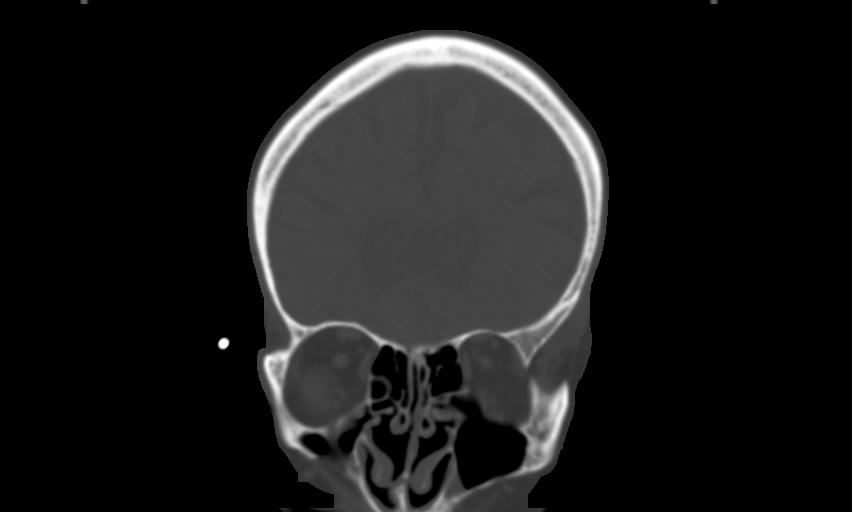
[im 53/69  brain]
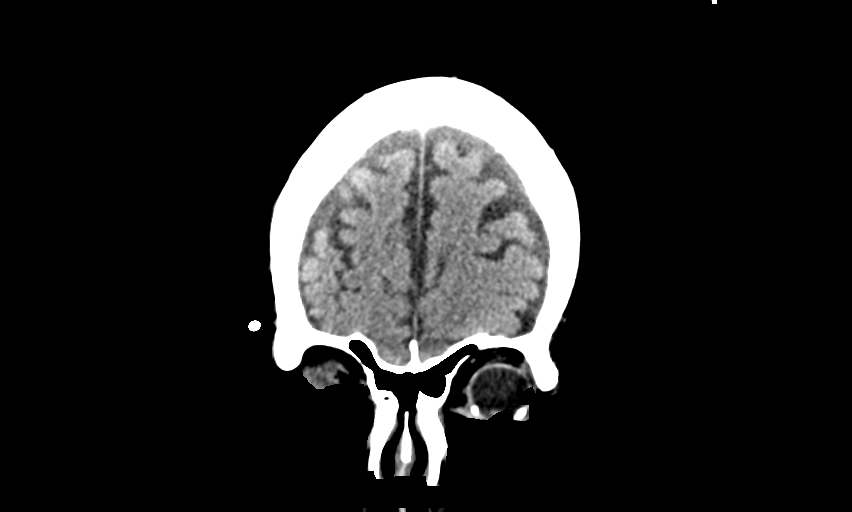
[im 61/69  brain]
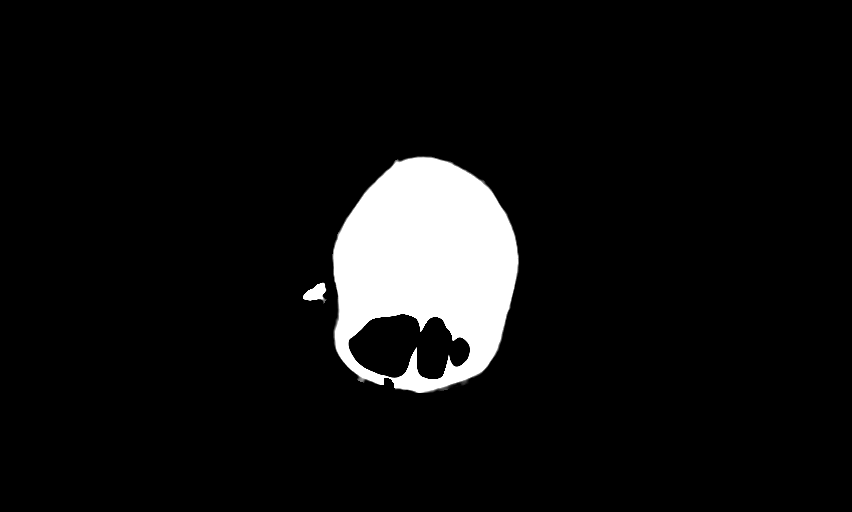

[Series 6: head without sag · sagittal · non-contrast · 0.38mm/px · 3 of 63 slices shown]
[im 21/63  brain]
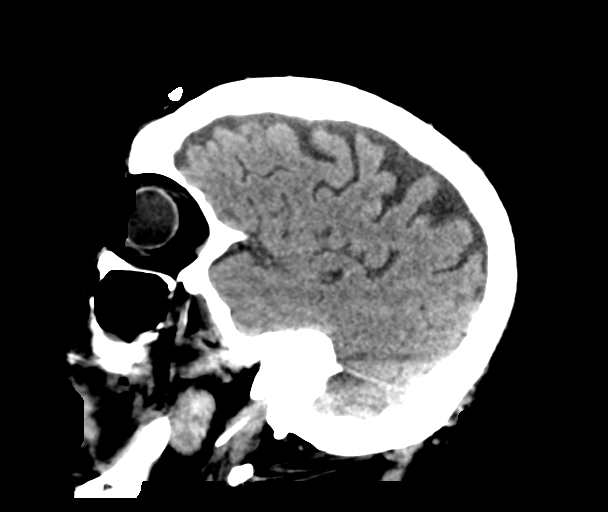
[im 32/63  brain]
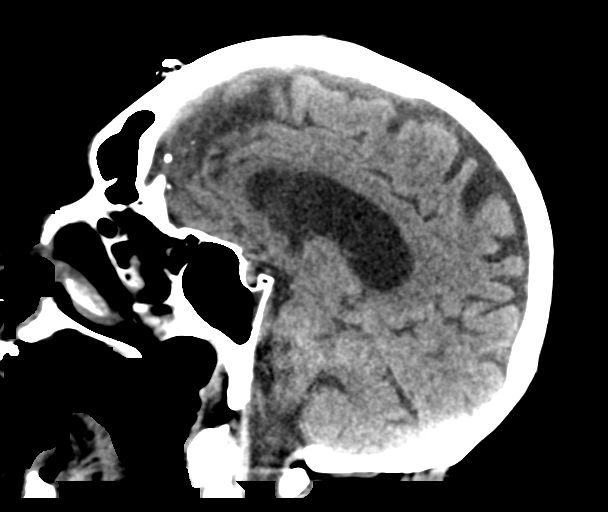
[im 42/63  brain]
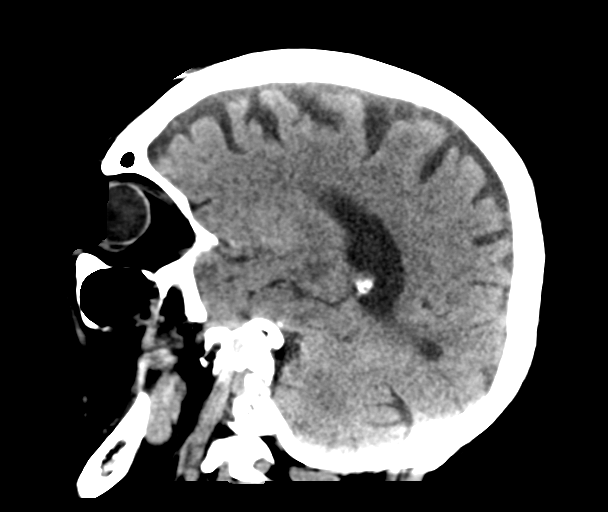

[10 of 37 positions shown; findings below may reference images not displayed]

FINDINGS: Brain: No acute intracranial findings are seen. There are no signs
of bleeding. Cavum septum pellucidum and cavum septum vergae are
seen. There is no shift of midline structures. Cortical sulci are
prominent.

Vascular: There are scattered arterial calcifications.

Skull: No fracture is seen.

Sinuses/Orbits: There is small air-fluid level in sphenoid sinus.
Mucosal thickening is seen in the ethmoid and maxillary sinuses.
There is fluid density in the mastoid air cells on both sides. There
is no break in the cortical margins in the mastoids. There is
suggestion of previous cataract surgery in both optic globes.

Other: None
IMPRESSION: No acute intracranial findings are seen.  Atrophy.

Chronic ethmoid and maxillary sinusitis. Small air-fluid level is
seen in the sphenoid sinus suggesting possible acute sinusitis.
There is fluid density in mastoid air cells on both sides suggesting
mastoid effusions or chronic mastoiditis.

## 2024-01-08 IMAGING — DX DG FOOT COMPLETE 3+V*R*
3 series · 3 of 3 positions shown · non-contrast
Comparison: None.

CLINICAL DATA: Right hallux injury, fall

EXAM:
RIGHT FOOT COMPLETE - 3+ VIEW

[foot ap]
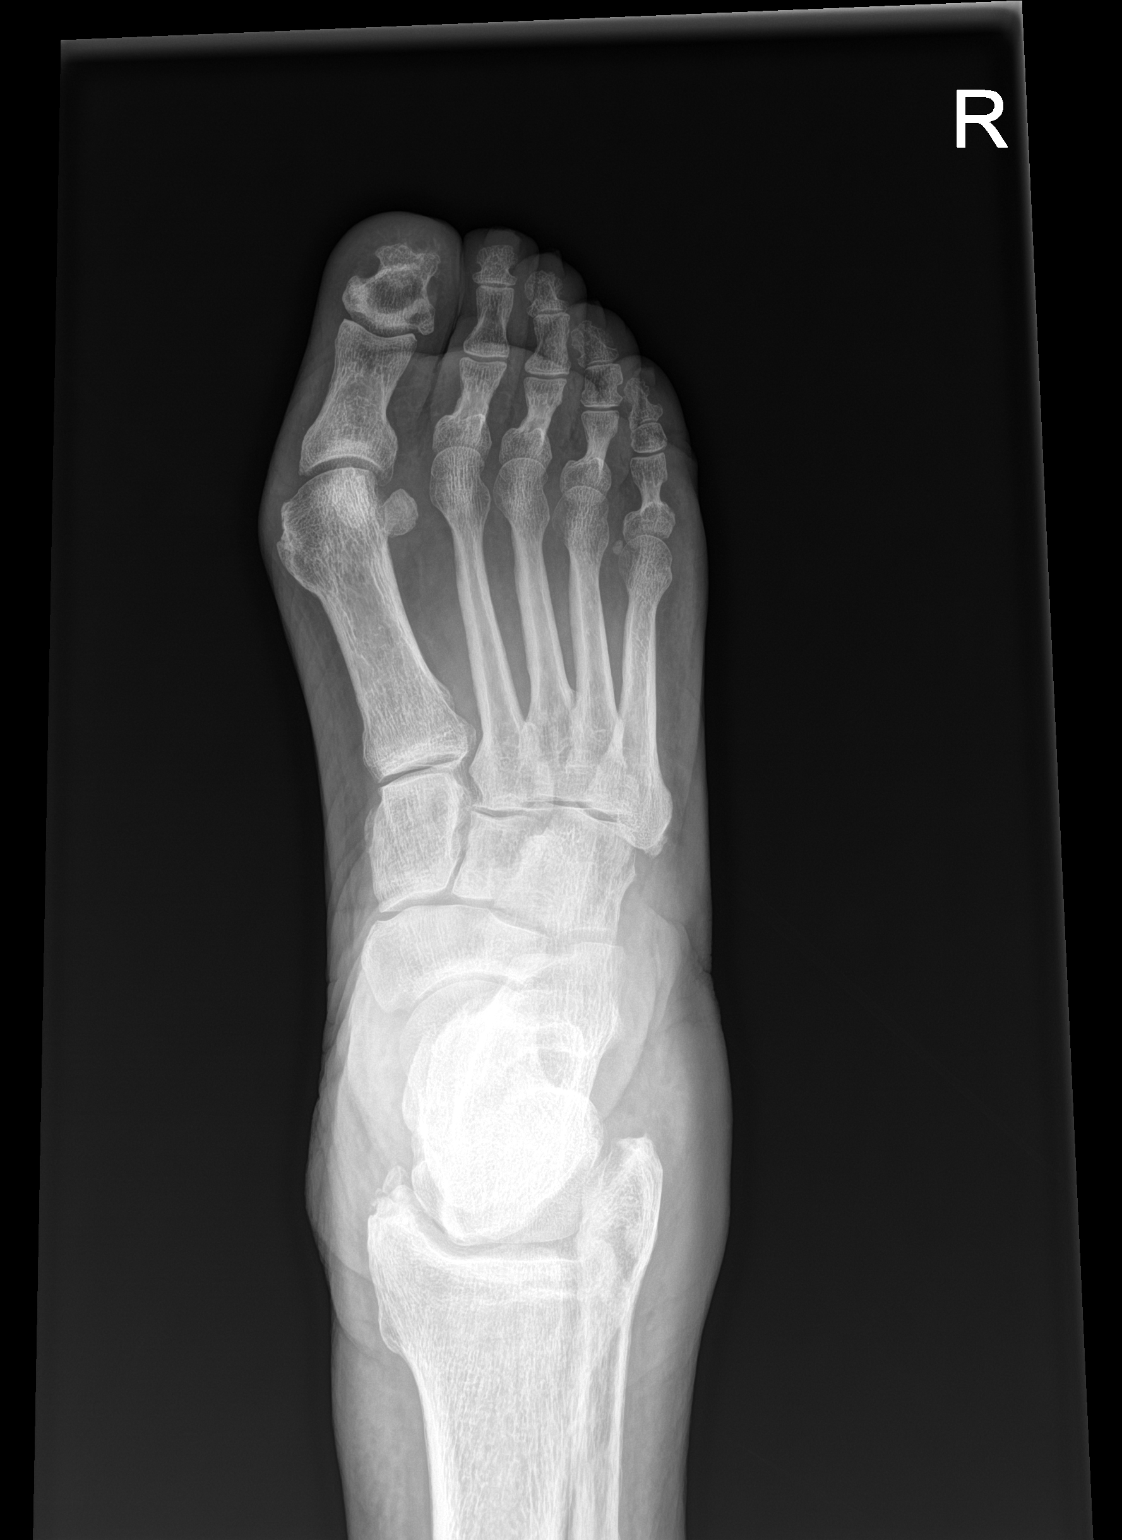

[foot obl]
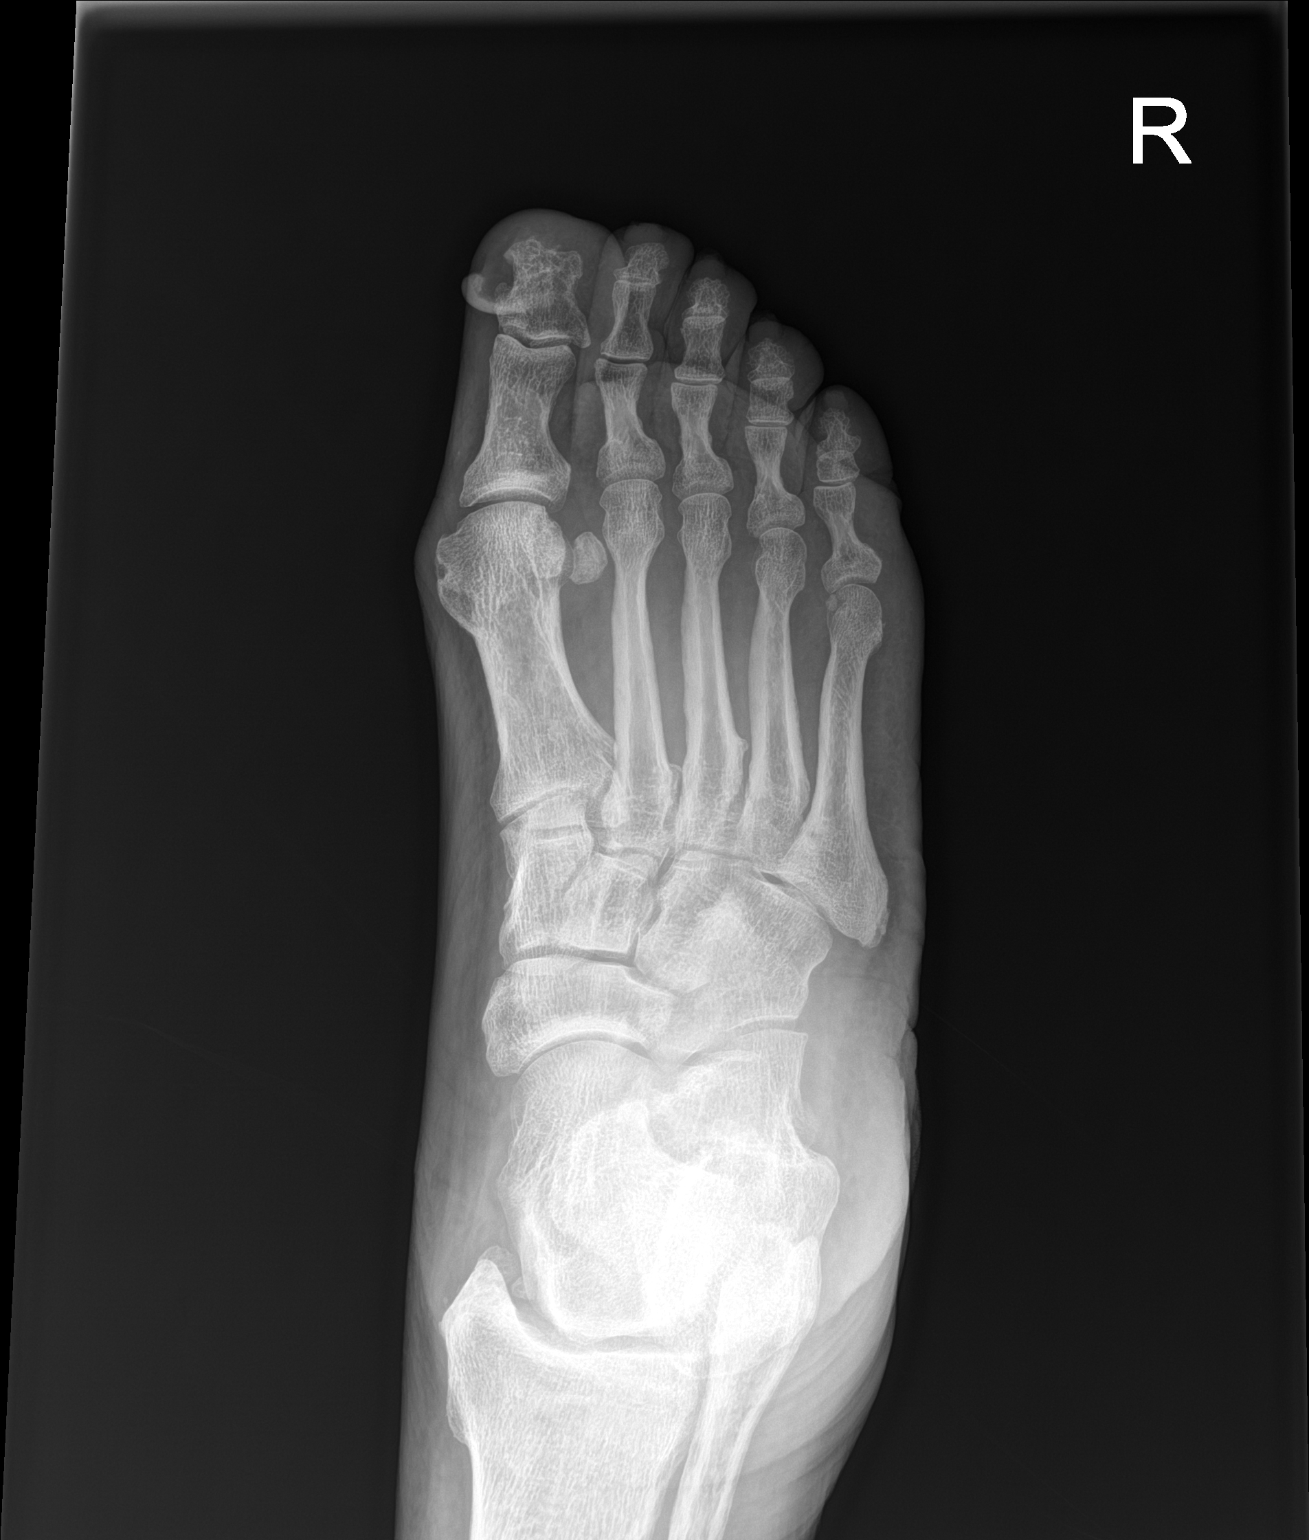

[foot lat]
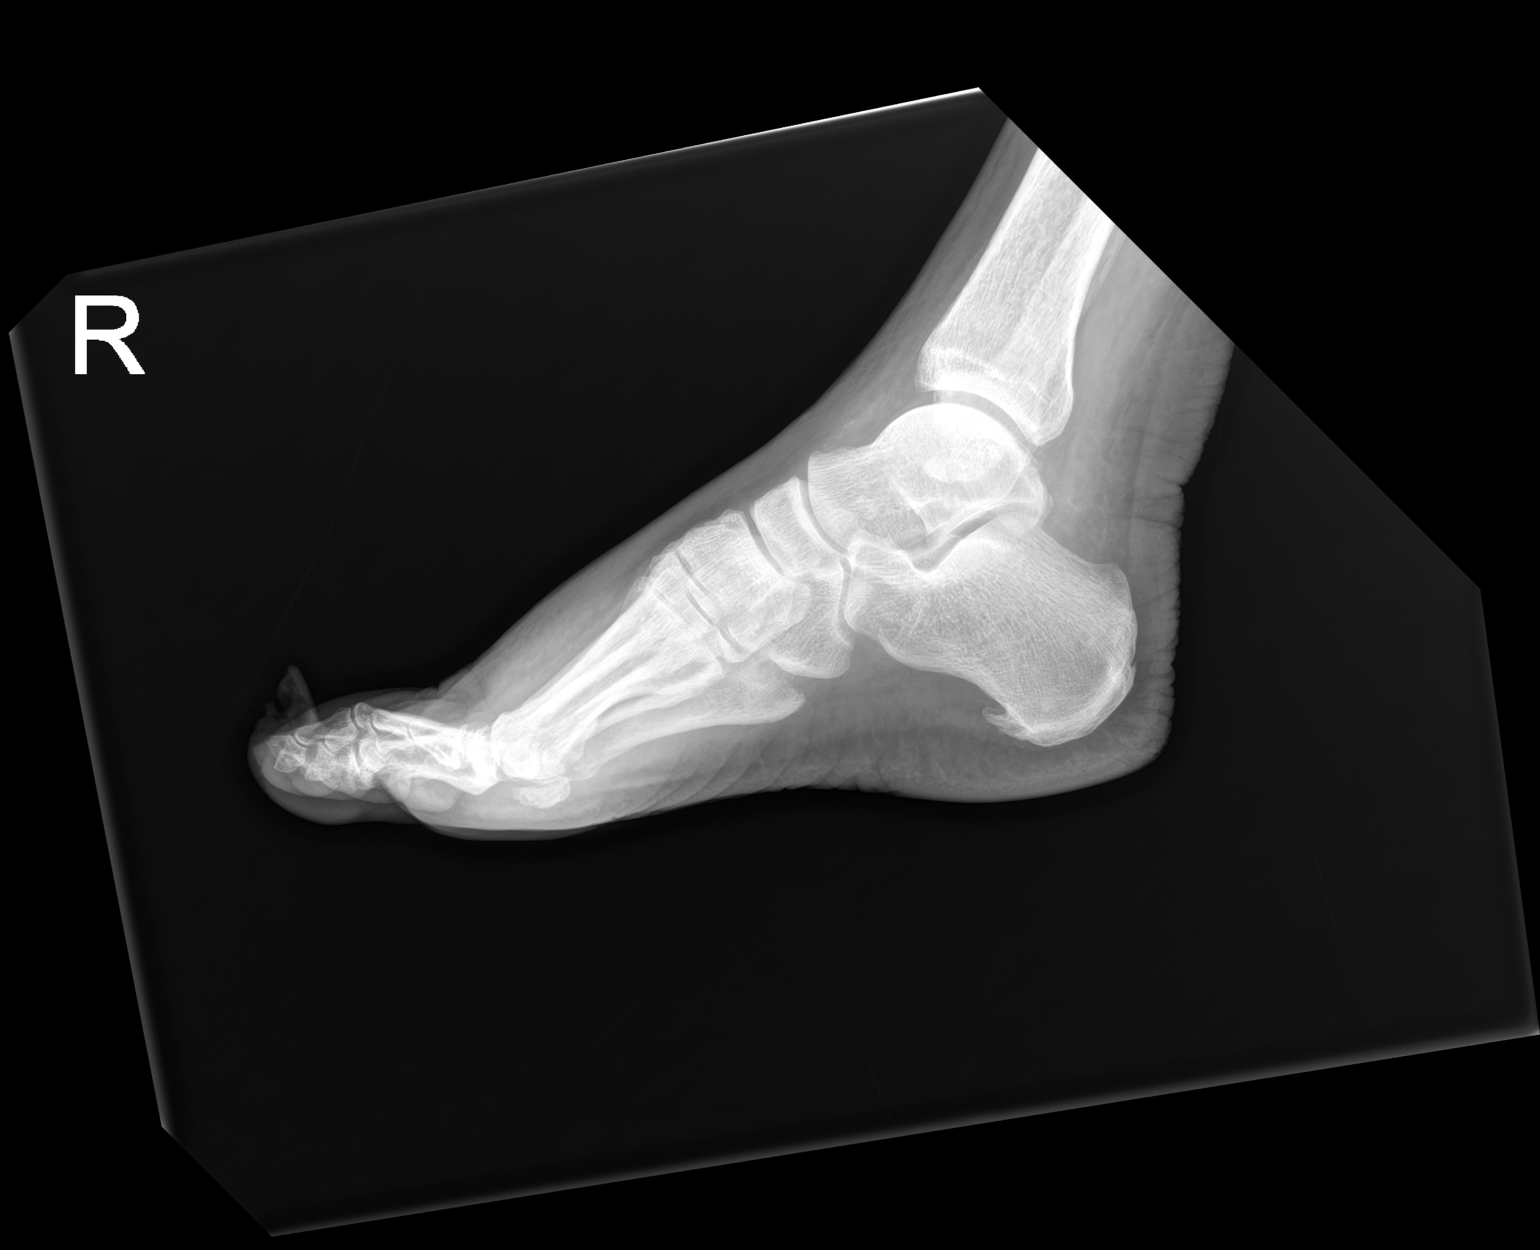

[3 of 3 positions shown; findings below may reference images not displayed]

FINDINGS: No acute fracture or dislocation. Emmet Van alignment. Mild hallux
valgus. Small erosion with well-defined sclerotic margin along the
medial aspect of the first metatarsal head, likely sequela of
crystalline arthropathy such as gout. Small plantar calcaneal spur.
Soft tissue swelling over the dorsum of the forefoot.
Atherosclerotic vascular calcifications are present.
IMPRESSION: 1. No acute fracture or dislocation. Soft tissue swelling over the
dorsum of the forefoot.
2. Mild hallux valgus.

## 2024-01-10 ENCOUNTER — Encounter: Payer: Self-pay | Admitting: Neurology

## 2024-01-18 ENCOUNTER — Other Ambulatory Visit: Payer: Self-pay | Admitting: Neurology

## 2024-01-18 NOTE — Telephone Encounter (Signed)
 Last seen on 12/29/23 per note " I sent in a prescription for donepezil  5 mg. This can be increased to 10 mg if he tolerates it well. "  No follow up scheduled     Will call and speak with patient about possible increasing dose

## 2024-01-19 NOTE — Telephone Encounter (Addendum)
 Left message for patient son Currie Douse (has DPR) access to call.

## 2024-01-20 NOTE — Telephone Encounter (Signed)
 Left message again for Harry Norris to call

## 2024-01-25 NOTE — Telephone Encounter (Signed)
 [name]  returning Phone call about Pt Medication . Pt son will like a call back.

## 2024-01-26 NOTE — Telephone Encounter (Signed)
 Spoke with Currie Douse he will check with the CNA'S and confirm patient is doing well on Rx and not side effects if well we can increase dose to 10 mg.   Currie Douse states he will call me back and let us  know.

## 2024-02-01 NOTE — Telephone Encounter (Signed)
 I called Harry Norris and he states its a lot going on at the [facility] and a new company is taking over. Harry Norris (son) said they are wanting to have a meeting with him to discuss updates with patient.   He asked I just send in refill now and he will update the office once the meeting occurs.

## 2024-02-04 DIAGNOSIS — I5022 Chronic systolic (congestive) heart failure: Secondary | ICD-10-CM | POA: Diagnosis not present

## 2024-02-04 DIAGNOSIS — J439 Emphysema, unspecified: Secondary | ICD-10-CM | POA: Diagnosis not present

## 2024-02-04 DIAGNOSIS — J9601 Acute respiratory failure with hypoxia: Secondary | ICD-10-CM | POA: Diagnosis not present

## 2024-02-04 DIAGNOSIS — J449 Chronic obstructive pulmonary disease, unspecified: Secondary | ICD-10-CM | POA: Diagnosis not present

## 2024-02-04 DIAGNOSIS — K219 Gastro-esophageal reflux disease without esophagitis: Secondary | ICD-10-CM | POA: Diagnosis not present

## 2024-02-10 DIAGNOSIS — J449 Chronic obstructive pulmonary disease, unspecified: Secondary | ICD-10-CM | POA: Diagnosis not present

## 2024-03-05 DIAGNOSIS — J439 Emphysema, unspecified: Secondary | ICD-10-CM | POA: Diagnosis not present

## 2024-03-05 DIAGNOSIS — J449 Chronic obstructive pulmonary disease, unspecified: Secondary | ICD-10-CM | POA: Diagnosis not present

## 2024-03-05 DIAGNOSIS — J9601 Acute respiratory failure with hypoxia: Secondary | ICD-10-CM | POA: Diagnosis not present

## 2024-03-10 DIAGNOSIS — E785 Hyperlipidemia, unspecified: Secondary | ICD-10-CM | POA: Diagnosis not present

## 2024-03-10 DIAGNOSIS — I5022 Chronic systolic (congestive) heart failure: Secondary | ICD-10-CM | POA: Diagnosis not present

## 2024-03-16 DIAGNOSIS — Z131 Encounter for screening for diabetes mellitus: Secondary | ICD-10-CM | POA: Diagnosis not present

## 2024-03-16 DIAGNOSIS — Z1329 Encounter for screening for other suspected endocrine disorder: Secondary | ICD-10-CM | POA: Diagnosis not present

## 2024-03-16 DIAGNOSIS — I10 Essential (primary) hypertension: Secondary | ICD-10-CM | POA: Diagnosis not present

## 2024-03-16 DIAGNOSIS — E785 Hyperlipidemia, unspecified: Secondary | ICD-10-CM | POA: Diagnosis not present

## 2024-04-10 DIAGNOSIS — K219 Gastro-esophageal reflux disease without esophagitis: Secondary | ICD-10-CM | POA: Diagnosis not present

## 2024-04-10 DIAGNOSIS — I509 Heart failure, unspecified: Secondary | ICD-10-CM | POA: Diagnosis not present

## 2024-04-10 DIAGNOSIS — Z7982 Long term (current) use of aspirin: Secondary | ICD-10-CM | POA: Diagnosis not present

## 2024-04-10 DIAGNOSIS — E441 Mild protein-calorie malnutrition: Secondary | ICD-10-CM | POA: Diagnosis not present

## 2024-04-10 DIAGNOSIS — J449 Chronic obstructive pulmonary disease, unspecified: Secondary | ICD-10-CM | POA: Diagnosis not present

## 2024-04-10 DIAGNOSIS — D508 Other iron deficiency anemias: Secondary | ICD-10-CM | POA: Diagnosis not present

## 2024-04-10 DIAGNOSIS — N1831 Chronic kidney disease, stage 3a: Secondary | ICD-10-CM | POA: Diagnosis not present

## 2024-04-10 DIAGNOSIS — E782 Mixed hyperlipidemia: Secondary | ICD-10-CM | POA: Diagnosis not present

## 2024-04-10 DIAGNOSIS — I2510 Atherosclerotic heart disease of native coronary artery without angina pectoris: Secondary | ICD-10-CM | POA: Diagnosis not present

## 2024-04-10 DIAGNOSIS — I130 Hypertensive heart and chronic kidney disease with heart failure and stage 1 through stage 4 chronic kidney disease, or unspecified chronic kidney disease: Secondary | ICD-10-CM | POA: Diagnosis not present

## 2024-04-10 DIAGNOSIS — G301 Alzheimer's disease with late onset: Secondary | ICD-10-CM | POA: Diagnosis not present

## 2024-04-14 DIAGNOSIS — K219 Gastro-esophageal reflux disease without esophagitis: Secondary | ICD-10-CM | POA: Diagnosis not present

## 2024-04-14 DIAGNOSIS — I5022 Chronic systolic (congestive) heart failure: Secondary | ICD-10-CM | POA: Diagnosis not present

## 2024-04-14 DIAGNOSIS — G301 Alzheimer's disease with late onset: Secondary | ICD-10-CM | POA: Diagnosis not present

## 2024-04-25 DIAGNOSIS — J449 Chronic obstructive pulmonary disease, unspecified: Secondary | ICD-10-CM | POA: Diagnosis not present

## 2024-05-12 DIAGNOSIS — G301 Alzheimer's disease with late onset: Secondary | ICD-10-CM | POA: Diagnosis not present

## 2024-05-12 DIAGNOSIS — I130 Hypertensive heart and chronic kidney disease with heart failure and stage 1 through stage 4 chronic kidney disease, or unspecified chronic kidney disease: Secondary | ICD-10-CM | POA: Diagnosis not present

## 2024-05-12 DIAGNOSIS — I5022 Chronic systolic (congestive) heart failure: Secondary | ICD-10-CM | POA: Diagnosis not present

## 2024-05-12 DIAGNOSIS — N1831 Chronic kidney disease, stage 3a: Secondary | ICD-10-CM | POA: Diagnosis not present

## 2024-05-25 DIAGNOSIS — J449 Chronic obstructive pulmonary disease, unspecified: Secondary | ICD-10-CM | POA: Diagnosis not present

## 2024-06-14 DIAGNOSIS — G301 Alzheimer's disease with late onset: Secondary | ICD-10-CM | POA: Diagnosis not present

## 2024-06-23 DIAGNOSIS — G301 Alzheimer's disease with late onset: Secondary | ICD-10-CM | POA: Diagnosis not present

## 2024-06-23 DIAGNOSIS — N1831 Chronic kidney disease, stage 3a: Secondary | ICD-10-CM | POA: Diagnosis not present

## 2024-06-23 DIAGNOSIS — I130 Hypertensive heart and chronic kidney disease with heart failure and stage 1 through stage 4 chronic kidney disease, or unspecified chronic kidney disease: Secondary | ICD-10-CM | POA: Diagnosis not present

## 2024-06-23 DIAGNOSIS — G4700 Insomnia, unspecified: Secondary | ICD-10-CM | POA: Diagnosis not present

## 2024-06-23 DIAGNOSIS — I5022 Chronic systolic (congestive) heart failure: Secondary | ICD-10-CM | POA: Diagnosis not present
